# Patient Record
Sex: Male | Born: 1945 | Race: White | Hispanic: No | Marital: Married | Smoking: Never smoker
Health system: Southern US, Community
[De-identification: ages and names within clinical notes are randomized; demographics above are authoritative.]

## PROBLEM LIST (undated history)

## (undated) DIAGNOSIS — F419 Anxiety disorder, unspecified: Secondary | ICD-10-CM

## (undated) DIAGNOSIS — Z8739 Personal history of other diseases of the musculoskeletal system and connective tissue: Secondary | ICD-10-CM

## (undated) DIAGNOSIS — R29898 Other symptoms and signs involving the musculoskeletal system: Secondary | ICD-10-CM

## (undated) DIAGNOSIS — Z8679 Personal history of other diseases of the circulatory system: Secondary | ICD-10-CM

## (undated) DIAGNOSIS — Z85820 Personal history of malignant melanoma of skin: Secondary | ICD-10-CM

## (undated) DIAGNOSIS — I10 Essential (primary) hypertension: Secondary | ICD-10-CM

## (undated) DIAGNOSIS — M1990 Unspecified osteoarthritis, unspecified site: Secondary | ICD-10-CM

## (undated) DIAGNOSIS — R9430 Abnormal result of cardiovascular function study, unspecified: Secondary | ICD-10-CM

## (undated) DIAGNOSIS — Z860100 Personal history of colon polyps, unspecified: Secondary | ICD-10-CM

## (undated) DIAGNOSIS — T8859XA Other complications of anesthesia, initial encounter: Secondary | ICD-10-CM

## (undated) DIAGNOSIS — IMO0001 Reserved for inherently not codable concepts without codable children: Secondary | ICD-10-CM

## (undated) DIAGNOSIS — Z8719 Personal history of other diseases of the digestive system: Secondary | ICD-10-CM

## (undated) DIAGNOSIS — R011 Cardiac murmur, unspecified: Secondary | ICD-10-CM

## (undated) DIAGNOSIS — Z92241 Personal history of systemic steroid therapy: Secondary | ICD-10-CM

## (undated) DIAGNOSIS — M5020 Other cervical disc displacement, unspecified cervical region: Secondary | ICD-10-CM

## (undated) DIAGNOSIS — IMO0002 Reserved for concepts with insufficient information to code with codable children: Secondary | ICD-10-CM

## (undated) DIAGNOSIS — C801 Malignant (primary) neoplasm, unspecified: Secondary | ICD-10-CM

## (undated) DIAGNOSIS — K219 Gastro-esophageal reflux disease without esophagitis: Secondary | ICD-10-CM

## (undated) DIAGNOSIS — M4716 Other spondylosis with myelopathy, lumbar region: Secondary | ICD-10-CM

## (undated) DIAGNOSIS — Z820 Family history of epilepsy and other diseases of the nervous system: Secondary | ICD-10-CM

## (undated) DIAGNOSIS — M479 Spondylosis, unspecified: Secondary | ICD-10-CM

## (undated) DIAGNOSIS — R42 Dizziness and giddiness: Secondary | ICD-10-CM

## (undated) DIAGNOSIS — H9190 Unspecified hearing loss, unspecified ear: Secondary | ICD-10-CM

## (undated) DIAGNOSIS — E7800 Pure hypercholesterolemia, unspecified: Secondary | ICD-10-CM

## (undated) DIAGNOSIS — T4145XA Adverse effect of unspecified anesthetic, initial encounter: Secondary | ICD-10-CM

## (undated) HISTORY — PX: CERVICAL DISCECTOMY: SHX98

## (undated) HISTORY — DX: Reserved for concepts with insufficient information to code with codable children: IMO0002

## (undated) HISTORY — DX: Abnormal result of cardiovascular function study, unspecified: R94.30

## (undated) HISTORY — DX: Pure hypercholesterolemia, unspecified: E78.00

## (undated) HISTORY — DX: Essential (primary) hypertension: I10

## (undated) HISTORY — PX: BACK SURGERY: SHX140

## (undated) HISTORY — DX: Dizziness and giddiness: R42

## (undated) HISTORY — PX: ROTATOR CUFF REPAIR: SHX139

## (undated) HISTORY — PX: JOINT REPLACEMENT: SHX530

## (undated) HISTORY — DX: Spondylosis, unspecified: M47.9

## (undated) HISTORY — DX: Personal history of other diseases of the musculoskeletal system and connective tissue: Z87.39

## (undated) HISTORY — DX: Personal history of colonic polyps: Z86.010

## (undated) HISTORY — DX: Personal history of colon polyps, unspecified: Z86.0100

## (undated) HISTORY — DX: Personal history of malignant melanoma of skin: Z85.820

## (undated) HISTORY — DX: Gastro-esophageal reflux disease without esophagitis: K21.9

## (undated) HISTORY — DX: Other spondylosis with myelopathy, lumbar region: M47.16

## (undated) HISTORY — DX: Family history of epilepsy and other diseases of the nervous system: Z82.0

## (undated) HISTORY — DX: Unspecified osteoarthritis, unspecified site: M19.90

## (undated) HISTORY — DX: Personal history of other diseases of the circulatory system: Z86.79

---

## 1999-01-28 ENCOUNTER — Ambulatory Visit (HOSPITAL_COMMUNITY): Admission: RE | Admit: 1999-01-28 | Discharge: 1999-01-28 | Payer: Self-pay | Admitting: Gastroenterology

## 2000-02-16 ENCOUNTER — Encounter: Payer: Self-pay | Admitting: Pulmonary Disease

## 2000-02-16 ENCOUNTER — Encounter: Admission: RE | Admit: 2000-02-16 | Discharge: 2000-02-16 | Payer: Self-pay | Admitting: Pulmonary Disease

## 2000-03-31 ENCOUNTER — Ambulatory Visit (HOSPITAL_COMMUNITY): Admission: RE | Admit: 2000-03-31 | Discharge: 2000-03-31 | Payer: Self-pay | Admitting: Diagnostic Radiology

## 2000-03-31 ENCOUNTER — Encounter: Payer: Self-pay | Admitting: Orthopaedic Surgery

## 2001-05-26 ENCOUNTER — Ambulatory Visit (HOSPITAL_BASED_OUTPATIENT_CLINIC_OR_DEPARTMENT_OTHER): Admission: RE | Admit: 2001-05-26 | Discharge: 2001-05-26 | Payer: Self-pay | Admitting: General Surgery

## 2001-05-26 HISTORY — PX: MELANOMA EXCISION WITH SENTINEL LYMPH NODE BIOPSY: SHX5267

## 2001-10-26 HISTORY — PX: ANTERIOR CERVICAL DECOMP/DISCECTOMY FUSION: SHX1161

## 2001-12-15 ENCOUNTER — Ambulatory Visit (HOSPITAL_COMMUNITY): Admission: RE | Admit: 2001-12-15 | Discharge: 2001-12-15 | Payer: Self-pay | Admitting: *Deleted

## 2001-12-15 ENCOUNTER — Encounter: Payer: Self-pay | Admitting: *Deleted

## 2002-09-25 ENCOUNTER — Encounter: Payer: Self-pay | Admitting: Orthopaedic Surgery

## 2002-09-25 ENCOUNTER — Ambulatory Visit (HOSPITAL_COMMUNITY): Admission: RE | Admit: 2002-09-25 | Discharge: 2002-09-25 | Payer: Self-pay | Admitting: Orthopaedic Surgery

## 2002-09-29 ENCOUNTER — Encounter: Payer: Self-pay | Admitting: Neurosurgery

## 2002-09-29 ENCOUNTER — Encounter: Admission: RE | Admit: 2002-09-29 | Discharge: 2002-09-29 | Payer: Self-pay | Admitting: Neurosurgery

## 2002-09-30 ENCOUNTER — Encounter: Payer: Self-pay | Admitting: Neurosurgery

## 2002-09-30 ENCOUNTER — Inpatient Hospital Stay (HOSPITAL_COMMUNITY): Admission: RE | Admit: 2002-09-30 | Discharge: 2002-10-01 | Payer: Self-pay | Admitting: Neurosurgery

## 2003-12-14 ENCOUNTER — Encounter: Admission: RE | Admit: 2003-12-14 | Discharge: 2003-12-14 | Payer: Self-pay | Admitting: Orthopaedic Surgery

## 2004-03-08 ENCOUNTER — Emergency Department (HOSPITAL_COMMUNITY): Admission: EM | Admit: 2004-03-08 | Discharge: 2004-03-08 | Payer: Self-pay | Admitting: Emergency Medicine

## 2004-03-09 ENCOUNTER — Ambulatory Visit (HOSPITAL_COMMUNITY): Admission: RE | Admit: 2004-03-09 | Discharge: 2004-03-09 | Payer: Self-pay | Admitting: Emergency Medicine

## 2004-03-26 ENCOUNTER — Encounter: Admission: RE | Admit: 2004-03-26 | Discharge: 2004-03-26 | Payer: Self-pay | Admitting: Pulmonary Disease

## 2005-05-04 ENCOUNTER — Encounter: Admission: RE | Admit: 2005-05-04 | Discharge: 2005-05-04 | Payer: Self-pay | Admitting: Diagnostic Radiology

## 2005-10-11 ENCOUNTER — Encounter: Admission: RE | Admit: 2005-10-11 | Discharge: 2005-10-11 | Payer: Self-pay | Admitting: Pulmonary Disease

## 2006-07-26 ENCOUNTER — Ambulatory Visit: Payer: Self-pay | Admitting: Pulmonary Disease

## 2006-10-26 DIAGNOSIS — R011 Cardiac murmur, unspecified: Secondary | ICD-10-CM

## 2006-10-26 HISTORY — DX: Cardiac murmur, unspecified: R01.1

## 2007-01-20 ENCOUNTER — Encounter: Payer: Self-pay | Admitting: Cardiology

## 2007-01-20 ENCOUNTER — Inpatient Hospital Stay (HOSPITAL_COMMUNITY): Admission: EM | Admit: 2007-01-20 | Discharge: 2007-01-20 | Payer: Self-pay | Admitting: Emergency Medicine

## 2007-01-20 ENCOUNTER — Ambulatory Visit: Payer: Self-pay | Admitting: *Deleted

## 2007-02-17 ENCOUNTER — Ambulatory Visit: Payer: Self-pay | Admitting: Cardiology

## 2008-01-11 DIAGNOSIS — I10 Essential (primary) hypertension: Secondary | ICD-10-CM | POA: Insufficient documentation

## 2008-01-11 DIAGNOSIS — C439 Malignant melanoma of skin, unspecified: Secondary | ICD-10-CM | POA: Insufficient documentation

## 2008-03-01 ENCOUNTER — Encounter: Payer: Self-pay | Admitting: Pulmonary Disease

## 2008-06-28 ENCOUNTER — Telehealth: Payer: Self-pay | Admitting: Pulmonary Disease

## 2008-07-16 ENCOUNTER — Ambulatory Visit: Payer: Self-pay | Admitting: Pulmonary Disease

## 2008-07-16 LAB — CONVERTED CEMR LAB: Vit D, 1,25-Dihydroxy: 41 (ref 30–89)

## 2008-07-17 ENCOUNTER — Encounter: Admission: RE | Admit: 2008-07-17 | Discharge: 2008-07-17 | Payer: Self-pay | Admitting: Pulmonary Disease

## 2008-07-19 ENCOUNTER — Ambulatory Visit: Payer: Self-pay | Admitting: Pulmonary Disease

## 2008-07-19 DIAGNOSIS — K219 Gastro-esophageal reflux disease without esophagitis: Secondary | ICD-10-CM | POA: Insufficient documentation

## 2008-07-19 DIAGNOSIS — Z860100 Personal history of colon polyps, unspecified: Secondary | ICD-10-CM | POA: Insufficient documentation

## 2008-07-19 DIAGNOSIS — J309 Allergic rhinitis, unspecified: Secondary | ICD-10-CM | POA: Insufficient documentation

## 2008-07-19 DIAGNOSIS — M479 Spondylosis, unspecified: Secondary | ICD-10-CM | POA: Insufficient documentation

## 2008-07-19 DIAGNOSIS — I00 Rheumatic fever without heart involvement: Secondary | ICD-10-CM | POA: Insufficient documentation

## 2008-07-19 DIAGNOSIS — H9319 Tinnitus, unspecified ear: Secondary | ICD-10-CM | POA: Insufficient documentation

## 2008-07-19 DIAGNOSIS — E785 Hyperlipidemia, unspecified: Secondary | ICD-10-CM | POA: Insufficient documentation

## 2008-07-19 LAB — CONVERTED CEMR LAB
ALT: 39 units/L (ref 0–53)
AST: 27 units/L (ref 0–37)
Albumin: 4.3 g/dL (ref 3.5–5.2)
Alkaline Phosphatase: 73 units/L (ref 39–117)
BUN: 19 mg/dL (ref 6–23)
Basophils Absolute: 0.1 10*3/uL (ref 0.0–0.1)
Basophils Relative: 0.9 % (ref 0.0–3.0)
Bilirubin Urine: NEGATIVE
Bilirubin, Direct: 0.2 mg/dL (ref 0.0–0.3)
CO2: 31 meq/L (ref 19–32)
Calcium: 9.5 mg/dL (ref 8.4–10.5)
Chloride: 107 meq/L (ref 96–112)
Cholesterol: 171 mg/dL (ref 0–200)
Creatinine, Ser: 1 mg/dL (ref 0.4–1.5)
Eosinophils Absolute: 0.2 10*3/uL (ref 0.0–0.7)
Eosinophils Relative: 2.8 % (ref 0.0–5.0)
GFR calc Af Amer: 98 mL/min
GFR calc non Af Amer: 81 mL/min
Glucose, Bld: 109 mg/dL — ABNORMAL HIGH (ref 70–99)
HCT: 46.8 % (ref 39.0–52.0)
HDL: 55.8 mg/dL (ref >39.0)
Hemoglobin, Urine: NEGATIVE
Hemoglobin: 16.5 g/dL (ref 13.0–17.0)
Ketones, ur: NEGATIVE mg/dL
LDL Cholesterol: 104 mg/dL — ABNORMAL HIGH (ref 0–99)
Leukocytes, UA: NEGATIVE
Lymphocytes Relative: 20.4 % (ref 12.0–46.0)
MCHC: 35.2 g/dL (ref 30.0–36.0)
MCV: 91.7 fL (ref 78.0–100.0)
Monocytes Absolute: 0.8 10*3/uL (ref 0.1–1.0)
Monocytes Relative: 10.9 % (ref 3.0–12.0)
Neutro Abs: 4.6 10*3/uL (ref 1.4–7.7)
Neutrophils Relative %: 65 % (ref 43.0–77.0)
Nitrite: NEGATIVE
PSA: 1.24 ng/mL (ref 0.10–4.00)
Platelets: 290 10*3/uL (ref 150–400)
Potassium: 4.3 meq/L (ref 3.5–5.1)
RBC: 5.1 M/uL (ref 4.22–5.81)
RDW: 11.4 % — ABNORMAL LOW (ref 11.5–14.6)
Sodium: 141 meq/L (ref 135–145)
Specific Gravity, Urine: 1.01 (ref 1.000–1.03)
TSH: 1.81 microintl units/mL (ref 0.35–5.50)
Total Bilirubin: 1.4 mg/dL — ABNORMAL HIGH (ref 0.3–1.2)
Total CHOL/HDL Ratio: 3.1
Total Protein, Urine: NEGATIVE mg/dL
Total Protein: 7.5 g/dL (ref 6.0–8.3)
Triglycerides: 54 mg/dL (ref 0–149)
Urine Glucose: NEGATIVE mg/dL
Urobilinogen, UA: 0.2 (ref 0.0–1.0)
VLDL: 11 mg/dL (ref 0–40)
WBC: 7.1 10*3/uL (ref 4.5–10.5)
pH: 7.5 (ref 5.0–8.0)

## 2008-10-26 DIAGNOSIS — T8859XA Other complications of anesthesia, initial encounter: Secondary | ICD-10-CM

## 2008-10-26 HISTORY — DX: Other complications of anesthesia, initial encounter: T88.59XA

## 2008-12-06 ENCOUNTER — Encounter: Payer: Self-pay | Admitting: Pulmonary Disease

## 2008-12-18 ENCOUNTER — Encounter: Payer: Self-pay | Admitting: Adult Health

## 2008-12-28 ENCOUNTER — Telehealth (INDEPENDENT_AMBULATORY_CARE_PROVIDER_SITE_OTHER): Payer: Self-pay | Admitting: *Deleted

## 2009-01-17 ENCOUNTER — Encounter: Payer: Self-pay | Admitting: Pulmonary Disease

## 2009-01-22 ENCOUNTER — Encounter: Payer: Self-pay | Admitting: Pulmonary Disease

## 2009-01-29 ENCOUNTER — Telehealth: Payer: Self-pay | Admitting: Pulmonary Disease

## 2009-03-28 ENCOUNTER — Encounter: Payer: Self-pay | Admitting: Pulmonary Disease

## 2009-04-09 ENCOUNTER — Encounter: Payer: Self-pay | Admitting: Pulmonary Disease

## 2009-05-02 ENCOUNTER — Encounter: Payer: Self-pay | Admitting: Pulmonary Disease

## 2009-05-21 ENCOUNTER — Encounter: Payer: Self-pay | Admitting: Pulmonary Disease

## 2010-03-03 ENCOUNTER — Encounter: Payer: Self-pay | Admitting: Pulmonary Disease

## 2010-03-05 ENCOUNTER — Ambulatory Visit: Payer: Self-pay | Admitting: Pulmonary Disease

## 2010-05-19 ENCOUNTER — Telehealth: Payer: Self-pay | Admitting: Pulmonary Disease

## 2010-06-20 ENCOUNTER — Ambulatory Visit: Payer: Self-pay | Admitting: Pulmonary Disease

## 2010-06-20 ENCOUNTER — Encounter: Admission: RE | Admit: 2010-06-20 | Discharge: 2010-06-20 | Payer: Self-pay | Admitting: Pulmonary Disease

## 2010-06-20 DIAGNOSIS — M1189 Other specified crystal arthropathies, multiple sites: Secondary | ICD-10-CM | POA: Insufficient documentation

## 2010-06-20 DIAGNOSIS — M25559 Pain in unspecified hip: Secondary | ICD-10-CM | POA: Insufficient documentation

## 2010-06-21 DIAGNOSIS — M1990 Unspecified osteoarthritis, unspecified site: Secondary | ICD-10-CM | POA: Insufficient documentation

## 2010-07-04 ENCOUNTER — Encounter: Admission: RE | Admit: 2010-07-04 | Discharge: 2010-07-04 | Payer: Self-pay | Admitting: Pulmonary Disease

## 2010-07-04 ENCOUNTER — Encounter: Payer: Self-pay | Admitting: Pulmonary Disease

## 2010-07-11 ENCOUNTER — Encounter: Payer: Self-pay | Admitting: Pulmonary Disease

## 2010-07-21 ENCOUNTER — Encounter: Payer: Self-pay | Admitting: Pulmonary Disease

## 2010-07-22 ENCOUNTER — Encounter: Admission: RE | Admit: 2010-07-22 | Discharge: 2010-07-22 | Payer: Self-pay | Admitting: Pulmonary Disease

## 2010-09-24 ENCOUNTER — Telehealth: Payer: Self-pay | Admitting: Pulmonary Disease

## 2010-09-29 ENCOUNTER — Encounter: Payer: Self-pay | Admitting: Pulmonary Disease

## 2010-10-12 ENCOUNTER — Encounter: Admission: RE | Admit: 2010-10-12 | Payer: Self-pay | Source: Home / Self Care | Admitting: Orthopaedic Surgery

## 2010-10-12 ENCOUNTER — Encounter
Admission: RE | Admit: 2010-10-12 | Discharge: 2010-10-12 | Payer: Self-pay | Source: Home / Self Care | Attending: Orthopaedic Surgery | Admitting: Orthopaedic Surgery

## 2010-10-13 ENCOUNTER — Encounter
Admission: RE | Admit: 2010-10-13 | Discharge: 2010-10-13 | Payer: Self-pay | Source: Home / Self Care | Attending: Orthopaedic Surgery | Admitting: Orthopaedic Surgery

## 2010-10-31 ENCOUNTER — Encounter: Payer: Self-pay | Admitting: Pulmonary Disease

## 2010-11-17 ENCOUNTER — Encounter
Admission: RE | Admit: 2010-11-17 | Discharge: 2010-11-17 | Payer: Self-pay | Source: Home / Self Care | Attending: Neurosurgery | Admitting: Neurosurgery

## 2010-11-25 NOTE — Progress Notes (Signed)
Summary: REFILL  Phone Note Call from Patient Call back at [phone]   Caller: Patient Call For: Peter [name] Summary of Call: PT NEEDS A REFILL ON ACILOVER 200 MG CALLED TO [facility] [phone] Initial call taken by: Lacinda Axon,  May 19, 2010 8:43 AM  Follow-up for Phone Call        lmomtcb Randell Loop [facility]  May 19, 2010 9:03 AM   pt returned my call---acyclovir has been sent to his pharmacy Randell Loop CMA  May 19, 2010 9:14 AM     New/Updated Medications: ACYCLOVIR 200 MG CAPS (ACYCLOVIR) take one capsule by mouth four times daily Prescriptions: ACYCLOVIR 200 MG CAPS (ACYCLOVIR) take one capsule by mouth four times daily  #50 x 0   Entered by:   Randell Loop CMA   Authorized by:   Michele Mcalpine MD   Signed by:   Randell Loop CMA on 05/19/2010   Method used:   [facility]ronically to        [facility] [address] [phone][facility] (retail)       [address]       [place], [place]  [address]       Ph: [phone] or [phone]       Fax: [phone]   RxID:   [id number]

## 2010-11-25 NOTE — Procedures (Signed)
Summary: [facility]  [facility]   Imported By: Sherian Rein 03/10/2010 11:54:21  _____________________________________________________________________  External Attachment:    Type:   Image     Comment:   External Document

## 2010-11-25 NOTE — Progress Notes (Signed)
Summary: appt/ labs?  Phone Note Call from Patient   Caller: Patient Call For: Peter [name] Summary of Call: pt- dr Peter [name]- wants to know what labs he will need to have done at the time of his visit. i have put this is as an rov- ok per Peter [name]- however- if this needs to be a cpx it will need to be changed. pt didn't mention this would be a cpx. pt can be reached at (his personal #)  [phone] today or tomorrow at [facility] [phone]. Initial call taken by: Peter [name],  June 28, 2008 10:26 AM  Follow-up for Phone Call        called and spoke with Dr. Gery [name] and he will come in on 9/21 to get his labs done for his appt on 9/24.  labs have been ordered by SN. Follow-up by: Peter [name] CMA,  June 29, 2008 8:53 AM

## 2010-11-25 NOTE — Progress Notes (Signed)
Summary: Protonix refill  Phone Note From Pharmacy   Caller: Nicholos Johns at [facility] For: Kriste Basque  Summary of Call: Called to get verbal refill for Protonix 90 day supply.  Initial call taken by: Reynaldo Minium CMA,  September 24, 2010 4:02 PM  Follow-up for Phone Call        Verbal was given for 90 days plus 1 refill.Reynaldo Minium CMA  September 24, 2010 4:02 PM     Prescriptions: PROTONIX 40 MG  PACK (PANTOPRAZOLE SODIUM) Take 1 tablet by mouth once a day  #90 x 1   Entered by:   Reynaldo Minium CMA   Authorized by:   Michele Mcalpine MD   Signed by:   Reynaldo Minium CMA on 09/24/2010   Method used:   Historical   RxID:   [id number]

## 2010-11-25 NOTE — Medication Information (Signed)
Summary: HCTZ 25mg / [facility]  HCTZ 25mg / [facility]   Imported By: Lester Fauquier 04/04/2009 08:05:33  _____________________________________________________________________  External Attachment:    Type:   Image     Comment:   External Document

## 2010-11-25 NOTE — Medication Information (Signed)
Summary: Rx for Accup[facility]  Rx for Accup[facility]   Imported By: Esmeralda Links D'jimraou 12/12/2008 11:57:57  _____________________________________________________________________  External Attachment:    Type:   Image     Comment:   External Document

## 2010-11-25 NOTE — Progress Notes (Signed)
Summary: labs  Phone Note Call from Patient Call back at [phone]   Caller: Patient Call For: Peter [name] Summary of Call: pt faxed over labs over a week ago still hasnt heard on them he will be at this number till 5:30 and will be back there tomorrow at 8:30 to 5;30 Initial call taken by: Lacinda Axon,  January 29, 2009 5:08 PM  Follow-up for Phone Call        I called Dr Gery Pray and we decided on Lipitor40... SN Follow-up by: Michele Mcalpine MD,  January 29, 2009 5:35 PM    New/Updated Medications: LIPITOR 40 MG TABS (ATORVASTATIN CALCIUM) take 1 tab by mouth once daily... LOVAZA 1 GM CAPS (OMEGA-3-ACID ETHYL ESTERS) take 2 caps by mouth two times a day... AMBIEN 10 MG TABS (ZOLPIDEM TARTRATE) take 1 tab by mouth at bedtime as needed for sleep...   Prescriptions: AMBIEN 10 MG TABS (ZOLPIDEM TARTRATE) take 1 tab by mouth at bedtime as needed for sleep...  #30 x 5   Entered by:   Michele Mcalpine MD   Authorized by:   Marijo File CMA   Signed by:   Michele Mcalpine MD on 01/29/2009   Method used:   Print then Give to Patient   RxID:   [id number] LIPITOR 40 MG TABS (ATORVASTATIN CALCIUM) take 1 tab by mouth once daily...  #90 x 4   Entered by:   Michele Mcalpine MD   Authorized by:   Marijo File CMA   Signed by:   Michele Mcalpine MD on 01/29/2009   Method used:   Print then Give to Patient   RxID:   [id number]

## 2010-11-25 NOTE — Progress Notes (Signed)
Summary: PT CALLED BACK  Phone Note Call from Patient Call back at [facility] [phone]   Caller: Patient Call For: NADEL Summary of Call: PT WENT TO SEE DR CROUSE  AND HE WANTS TO INCREASE HIS HCTZ HE WANTS TO KNOW IF THIS IS OK AND IF SO DOES HE NEED TO INCREASE HIS POTASSIUM Initial call taken by: Lacinda Axon,  December 28, 2008 12:03 PM  Follow-up for Phone Call        lmtcb  Philipp Deputy [facility]  December 28, 2008 12:08 PM   dr Kriste Basque spoke to pt and took care of this msg Follow-up by: Philipp Deputy CMA,  December 28, 2008 3:17 PM

## 2010-11-25 NOTE — Assessment & Plan Note (Signed)
Summary: hip pain/right hand pain/la   CC:  Add-on for R>L hip area pain....  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  see prob list below:   ~  June 20, 2010:  Add-on today at pt request for intermittent R>L hip area pain, worse recently & Rx w/ OTC Ibuprofen w/ some help... no prev ortho problems, but mild OA apparent on exam fingers etc... exam shows good ROM w/o pain, non-tender, no skin lesions, etc... we discussed Rx w/ MOBIC, check XRays, & next step= Ortho eval for poss shot in the area.   Current Problem List:  ALLERGIC RHINITIS (ICD-477.9) - eval by DrKozlow et al in 2006... seasonal symptoms treated w/ OTC meds as needed... currently on SINGULAIR 10mg /d, and NASONEX 2sp Qhs...  TINNITUS, CHRONIC (ICD-388.30) - eval by DrKraus in the past... chronic symptom- deals with it effectively using white noise etc...  HYPERTENSION (ICD-401.9) - controlled on meds- ASA 81mg /d,  NORVASC 10mg /d,  ACCUPRIL 10mg /d,  HCTZ 25mg /d,  KCl - 1/2 Qd...  BP= 132/70 today, takes meds regularly & tol well w/o side effects... BP checks at home and work are similar... denies HA, fatigue, visual changes, CP, palipit, dizziness, syncope, dyspnea, edema, etc...   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 eval by cardiology in the 80's & no valvular heart disease... he had a Cardiolite in 2005 that was neg- no ischemia & EF=60%... he was hosp w/ CP episode 3/08 by DrBBrodie- felt to be prob pericarditis... he had a Coronary CTA showing sl plaque at an LAD branch vessel (D2), mixed plaque in CIRC, no plaque in prox-mid RCA; Calcium score 0.65 (<25th percentlie)... 2DEcho 3/08 showed no signif valvular lesions, LVwall thickness at upper limit, no regional wall motion abn, LVF= 50-55%...  HYPERCHOLESTEROLEMIA, BORDERLINE (ICD-272.4) - he started taking Lipitor prev due to reports of poss benefits in Alzheimer's prevention...  currently on LIPITOR 40mg /d, and LOVAZA 4gm/d...  ~  FLP 4/08 on Lipitor40 showed TChol 138, TG 45, HDL 49, LDL 80  ~  FLP 9/09 on ?diet alone showed TChol 171, TG 54, HDL 56, LDL 104  ~  FLP 3/11 on Lip40+Lovaza showed TChol 124, TG 51, HDL 63, LDL 51  GERD (ICD-530.81) - he has been evaluated by [facility]... takes PROTONIX 40mg /d...  COLONIC POLYPS, HX OF (ICD-V12.72) - colonoscopy 4/04 by ZOXWRUEA w/ diminutive hyperplastic polyp removed & sm int hem...  f/u colonoscopy done 5/11 was neg- no polyps...  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  ~  8/11: presented w/ c/o R>L hip pain, XRays showed degen arthritis & mild chondrocalcinosis.  Hx of SPONDYLOSIS (ICD-721.90) - He is s/p C6-7 anterior cervical diskectomy w/ bone allograft & plating 12/03 by Lenon Oms... 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 also lumbar spondy w/ spurs at L4-5 seen on prev CTAbd done 12/06...  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 followed closely by DrPatMauro at [facility] & no known recurrence (switching to [facility]- DrJones)...  ALZHEIMER'S DISEASE, FAMILY HX (ICD-V17.0) - family hx of Alzheimer's disease in both his mother and father (both died in their  80's w/ this disease)...   Preventive Screening-Counseling & Management  Alcohol-Tobacco     Smoking Status: never  Allergies: 1)  ! Avelox (Moxifloxacin Hcl)  Comments:  Nurse/Medical Assistant: The patient's medications and allergies were reviewed with the patient and were updated in the Medication and Allergy Lists.  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 Father died age 48 from prob subdural hematoma, alzheimers Mother deceased age 2  w/ hx of alzheimers 3 Siblings: 1 Brother, 2 Sisters- ages 23-71 in good general health  Social History: Reviewed history from 07/19/2008 and no changes required. Married - wife= [name] 2 children- Tax adviser, Son= physician non smoker exercises 3 times per week caffeine use:  2 cups/d etoh use:  1-2 glasses of wine nightly  Review of Systems      See HPI  The patient denies anorexia, fever, weight loss, weight gain, vision loss, decreased hearing, hoarseness, chest pain, syncope, dyspnea on exertion, peripheral edema, prolonged cough, headaches, hemoptysis, abdominal pain, melena, hematochezia, severe indigestion/heartburn, hematuria, incontinence, muscle weakness, suspicious skin lesions, transient blindness, difficulty walking, depression, unusual weight change, abnormal bleeding, enlarged lymph nodes, and angioedema.    Vital Signs:  Patient profile:   65 year old male Height:      70 inches Weight:      186.13 pounds O2 Sat:      97 % on Room air Temp:     96.1 degrees F oral Pulse rate:   75 / minute BP sitting:   132 / 70  (left arm) Cuff size:   regular  Vitals Entered By: Randell Loop CMA (June 20, 2010 9:24 AM)  O2 Sat at Rest %:  97 O2 Flow:  Room air CC: Add-on  for R>L hip area pain... Is Patient Diabetic? No Pain Assessment Patient in pain? yes      Comments meds updated today with pt   Physical Exam  Additional Exam:  WD, WN, 65 y/o WM in NAD... GENERAL:  Alert & oriented; pleasant & cooperative... HEENT:  Mallory/AT, EOM-wnl, PERRLA, EACs-clear, TMs-wnl, NOSE-clear, THROAT-clear & wnl. NECK:  Supple w/ fairROM; no JVD; normal carotid impulses w/o bruits; no thyromegaly or nodules palpated; no lymphadenopathy. CHEST:  Clear to P & A; without wheezes/ rales/ or rhonchi. HEART:  Regular Rhythm; without murmurs/ rubs/ or gallops. ABDOMEN:  Soft & nontender; right peri-umbil scar of melanoma surg; normal bowel sounds; no organomegaly or masses palpated. RECTAL:  Neg - prostate 2+ & nontender w/o nodules; stool hematest neg; groin lymphoceles. EXT:  mild OA fingers- norm ROM hips/ knees/ etc; no varicose veins/ +venous insuffic/ no edema. NEURO:  CN's intact; motor testing normal; sensory testing normal; gait normal & balance OK. DERM:  No lesions noted; no rash etc...    X-ray Musculoskeletal  Procedure date:  06/20/2010  Findings:      PELVIS - 1-2 VIEW Comparison: None   Findings: Surgical clips are present overlying the femoral head bilaterally.   Mild cartilage loss and spurring  is present involving the right femoral head and right acetabulum.  There is mild calcification lateral to the superior acetabulum which may be chondrocalcinosis.   There is moderate degenerative change in the left hip with spurring of the femoral head as well as joint space narrowing and mild acetabular spurring.  There is faint calcification lateral to the superior acetabulum which may be due to chondrocalcinosis of  the labrum.   Negative for fracture.  The SI joints are intact.  No acute bony abnormality.   IMPRESSION: Degenerative osteoarthritis of both hips, left greater than right.   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  This may  be due to CPPD or osteoarthritis.   Read By:  Camelia Phenes,  [name]   Impression & Recommendations:  Problem # 1:  DEGENERATIVE JOINT DISEASE (ICD-715.90) XRays show DJD, chondrocalcinosis>  we discussed Rx w/ MOBIC 7.5 to 15mg /d & further eval by Ortho id symptoms progress. His updated medication list for this problem includes:    Low-dose Aspirin 81 Mg Tabs (Aspirin) .Marland Kitchen... Take 1 tablet by mouth once a day    Meloxicam 7.5 Mg Tabs (Meloxicam) .Marland Kitchen... Take 1-2 tabs by mouth once daily as directed...  Problem # 2:  HYPERTENSION (ICD-401.9) BP controlled>  same meds. His updated medication list for this problem includes:    Norvasc 10 Mg Tabs (Amlodipine besylate) .Marland Kitchen... Take 1 tablet by mouth once a day    Accupril 10 Mg Tabs (Quinapril hcl) .Marland Kitchen... Take 1 tablet by mouth once a day    Hydrochlorothiazide 25 Mg Tabs (Hydrochlorothiazide) .Marland Kitchen... Take 1 tablet by mouth once a day  Orders: No Charge Patient Arrived (NCPA0) (NCPA0)  Problem # 3:  HYPERCHOLESTEROLEMIA, BORDERLINE (ICD-272.4) Stable on meds- continue same. His updated medication list for this problem includes:    Lipitor 40 Mg Tabs (Atorvastatin calcium) .Marland Kitchen... Take 1 tab by mouth once daily...    Lovaza 1 Gm Caps (Omega-3-acid ethyl esters) .Marland Kitchen... Take 2 caps by mouth two times a day...  Problem # 4:  OTHER MEDICAL PROBLEMS AS NOTED>>>  Complete Medication List: 1)  Nasonex 50 Mcg/act Susp (Mometasone furoate) .... Two puffs each nostril daily 2)  Singulair 10 Mg Tabs (Montelukast sodium) .Marland Kitchen.. 1 by mouth once daily 3)  Low-dose Aspirin 81 Mg Tabs (Aspirin) .... Take 1 tablet by mouth once a day 4)  Norvasc 10 Mg Tabs (Amlodipine besylate) .... Take 1 tablet by mouth once a day 5)  Accupril 10 Mg Tabs (Quinapril hcl) .... Take 1 tablet by mouth once a day 6)  Hydrochlorothiazide 25 Mg Tabs (Hydrochlorothiazide) .... Take 1 tablet by mouth once a day 7)  Klor-con M20 20 Meq Tbcr (Potassium chloride crys cr) .... Take 1/2   tablet by mouth once a day 8)  Lipitor 40 Mg Tabs (Atorvastatin calcium) .... Take 1 tab by mouth once daily.Marland KitchenMarland Kitchen 9)  Lovaza 1 Gm Caps (Omega-3-acid ethyl esters) .... Take 2 caps by mouth two times a day... 10)  Protonix 40 Mg Pack (Pantoprazole sodium) .... Take 1 tablet by mouth once a day 11)  Ambien 10 Mg Tabs (Zolpidem tartrate) .... Take 1 tab by mouth at bedtime as needed for sleep... 12)  Acyclovir 200 Mg Caps (Acyclovir) .... Take one capsule by mouth four times daily 13)  Meloxicam 7.5 Mg Tabs (Meloxicam) .... Take 1-2 tabs by mouth once daily as directed...  Patient Instructions: 1)  Today we updated your med list- see below.... 2)  we decided to add  generic MOBIC 7.5 to 15mg  daily as needed for the hip pain.Marland KitchenMarland Kitchen  3)  Please check right hip XRay at your convenience for baseline... 4)  Next step = Ortho eval as we discussed.Marland KitchenMarland Kitchen 5)  Call for any problems... Prescriptions: MELOXICAM 7.5 MG TABS (MELOXICAM) take 1-2 tabs by mouth once daily as directed...  #60 x 11   Entered and Authorized by:   Michele Mcalpine MD   Signed by:   Michele Mcalpine MD on 06/20/2010   Method used:   Print then Give to Patient   RxID:   [id number]    Immunization History:  Influenza Immunization History:    Influenza:  historical (08/14/2009)

## 2010-11-25 NOTE — Medication Information (Signed)
Summary: Tax adviser   Imported By: Lehman Prom 12/18/2008 08:05:45  _____________________________________________________________________  External Attachment:    Type:   Image     Comment:   External Document

## 2010-11-25 NOTE — Medication Information (Signed)
Summary: Rx for Singulair Tab/[facility]  Rx for Singulair Tab/[facility]   Imported By: Maryln Gottron 06/04/2009 15:48:32  _____________________________________________________________________  External Attachment:    Type:   Image     Comment:   External Document

## 2010-11-25 NOTE — Medication Information (Signed)
Summary: Tax adviser   Imported By: Valinda Hoar 04/09/2009 14:30:03  _____________________________________________________________________  External Attachment:    Type:   Image     Comment:   External Document

## 2010-11-25 NOTE — Miscellaneous (Signed)
  Clinical Lists Changes  Allergies: Added new allergy or adverse reaction of AVELOX (MOXIFLOXACIN HCL) Observations: Added new observation of ALLERGY REV: Done (05/02/2009 16:53) Added new observation of NKA: F (05/02/2009 16:53)

## 2010-11-25 NOTE — Medication Information (Signed)
Summary: LOVAZA;NASONEX;[facility]  LOVAZA;NASONEX;[facility]   Imported By: Lester [place] 01/28/2009 09:39:49  _____________________________________________________________________  External Attachment:    Type:   Image     Comment:   External Document

## 2010-11-25 NOTE — Assessment & Plan Note (Signed)
Summary: cpx/ok per lw   Chief Complaint:  CPX....  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 here for a physical exam- doing well without new complaints or concerns...   Current Problem List:  PHYSICAL EXAMINATION (ICD-V70.0)  ALLERGIC RHINITIS (ICD-477.9) - eval by DrKozlow et al in 2006... seasonal symptoms treated w/ OTC meds as needed.  TINNITUS, CHRONIC (ICD-388.30) - eval by DrKraus in the past... chronic symptom- deals with it effectively using white noise etc...  HYPERTENSION (ICD-401.9) - controlled on meds- NORVASC 10mg /d,  ACCUPRIL 10mg /d,  HCTZ 25mg /d,  KCl 69mEq/d...  BP= 120/82 today, takes meds regularly & tol well w/o side effects... BP checks at home and work are similar... denies HA, fatigue, visual changes, CP, palipit, dizziness, syncope, dyspnea, edema, etc...   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 eval by cardiology in the 80's & no valvular heart disease... he had a Cardiolite in 2005 that was neg- no ischemia & EF=60%... he was hosp w/ CP episode 3/08 by DrBBrodie- felt to be prob pericarditis... he had a Coronary CTA showing sl plaque at an LAD branch vessel (D2), mixed plaque in CIRC, no plaque in prox-mid RCA; Calcium score 0.65 (<25th percentlie)... 2DEcho 3/08 showed no signif valvular lesions, LVwall thickness at upper limit, no regional wall motion abn, LVF= 50-55%...  HYPERCHOLESTEROLEMIA, BORDERLINE (ICD-272.4) - he started taking Lipitor prev due to reports of poss benefits in Alzheimer's prevention... he has recently stopped the statin...  ~  FLP 4/08 on Lipitor40 showed TChol 138, TG 45, HDL 49, LDL 80...  ~  FLP 9/09 on diet alone showed TChol 171, TG 54, HDL 56, LDL 104...  GERD (ICD-530.81) - he has been [facility]... takes PROTONIX 40mg /d...  COLONIC POLYPS, HX OF (ICD-V12.72) - last colonoscopy 4/04 by [facility] w/ diminutive hyperplastic polyp removed &  sm int hem... f/u planned 17yrs...  Hx of SPONDYLOSIS (ICD-721.90) - He is s/p C6-7 anterior cervical diskectomy w/ bone allograft & plating 12/03 by drNudelman... 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 also lumbar spondy w/ spurs at L4-5 seen on prev CTAbd done 12/06...  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 followed closely by DrPatMauro at [facility] & no known recurrence...  ALZHEIMER'S DISEASE, FAMILY HX (ICD-V17.0) - family hx of Alzheimer's disease in both his mother and father (both died in their 28's w/ this disease)...      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)    HYPERCHOLESTEROLEMIA, BORDERLINE (ICD-272.4)    GERD (ICD-530.81)    COLONIC POLYPS, HX OF (ICD-V12.72)    Hx of SPONDYLOSIS (ICD-721.90)    ALZHEIMER'S DISEASE, FAMILY HX (ICD-V17.0)    Hx of MELANOMA (ICD-172.9)      Past Surgical History:    S/P wide excision of melanoma on abd wall w/ sentinel node biopsy 8/02 by DrPYoung    S/P C6-7 anterior cervical diskectomy w/ patellar allograft and plating 12/03 by Lenon Oms   Family History:    Father died age 40 from  prob subdural hematoma, alzheimers    Mother deceased age 10  w/ hx of alzheimers    3 Siblings: 1 Brother, 2 Sisters- ages 34-71 in good general health  Social History:    Married - wife= [name]    2 children- Tax adviser, Son= physician    non smoker    exercises 3 times per week    caffeine use:  2 cups/d    etoh use:  1-2 glasses of wine nightly   Risk Factors:  Tobacco use:  never   Review of Systems       The patient complains of tinnitus  and decreased hearing.  The patient denies fever, chills, sweats, anorexia, fatigue, weakness, malaise, weight loss, sleep disorder, blurring, diplopia, eye irritation, eye discharge, vision loss, eye pain, photophobia, earache, ear discharge, nasal congestion, nosebleeds, sore throat, hoarseness, chest pain, palpitations, syncope, dyspnea on exertion, orthopnea, PND, peripheral edema, cough, dyspnea at rest, excessive sputum, hemoptysis, wheezing, pleurisy, nausea, vomiting, diarrhea, constipation, change in bowel habits, abdominal pain, melena, hematochezia, jaundice, gas/bloating, indigestion/heartburn, dysphagia, odynophagia, dysuria, hematuria, urinary frequency, urinary hesitancy, nocturia, incontinence, back pain, joint pain, joint swelling, muscle cramps, muscle weakness, stiffness, arthritis, sciatica, restless legs, leg pain at night, leg pain with exertion, rash, itching, dryness, suspicious lesions, paralysis, paresthesias, seizures, tremors, vertigo, transient blindness, frequent falls, frequent headaches, difficulty walking, depression, anxiety, memory loss, confusion, cold intolerance, heat intolerance, polydipsia, polyphagia, polyuria, unusual weight change, abnormal bruising, bleeding, enlarged lymph nodes, urticaria, allergic rash, hay fever, and recurrent infections.     Vital Signs:  Patient Profile:   65 Years Old Male Weight:      184 pounds O2 Sat:      95 % O2 treatment:    Room Air Temp:     97.2 degrees F oral Pulse rate:   78 / minute BP sitting:   120 / 82  (right arm)  Vitals Entered By: Marijo File CMA (July 19, 2008 12:10 PM)                 Physical Exam  WD, WN, 65 y/o WM in NAD... GENERAL:  Alert & oriented; pleasant & cooperative... HEENT:  [place]/AT, EOM-wnl, PERRLA, EACs-clear, TMs-wnl, NOSE-clear, THROAT-clear & wnl. NECK:  Supple w/ fairROM; no JVD; normal carotid impulses w/o bruits; no thyromegaly or nodules palpated; no lymphadenopathy. CHEST:   Clear to P & A; without wheezes/ rales/ or rhonchi. HEART:  Regular Rhythm; without murmurs/ rubs/ or gallops. ABDOMEN:  Soft & nontender; right peri-umbil scar of melanoma surg; normal bowel sounds; no organomegaly or masses palpated. RECTAL:  Neg - prostate 2+ & nontender w/o nodules; stool hematest neg. EXT: without deformities or arthritic changes; no varicose veins/ +venous insuffic/ no edema. NEURO:  CN's intact; motor testing normal; sensory testing normal; gait normal & balance OK. DERM:  No lesions noted; no rash etc...        Impression & Recommendations:  Problem # 1:  PHYSICAL EXAMINATION (ICD-V70.0) Good general health... Orders: EKG w/ Interpretation (93000) No Charge Patient Arrived (NCPA0) (NCPA0)   Problem # 2:  ALLERGIC RHINITIS (ICD-477.9) Stable- OTC meds + Nasonex as needed. His updated medication list for this problem includes:    Nasonex 50 Mcg/act Susp (Mometasone furoate) .Marland Kitchen..Marland Kitchen Two puffs each nostril daily   Problem # 3:  HYPERTENSION (ICD-401.9) Controlled on meds- continue same. His updated medication list for this problem includes:    Norvasc 10 Mg Tabs (Amlodipine besylate) .Marland Kitchen... Take 1 tablet by mouth once  a day    Accupril 10 Mg Tabs (Quinapril hcl) .Marland Kitchen... Take 1 tablet by mouth once a day    Hydrochlorothiazide 25 Mg Tabs (Hydrochlorothiazide) .Marland Kitchen... Take 1 tablet by mouth once a day   Problem # 4:  HYPERCHOLESTEROLEMIA, BORDERLINE (ICD-272.4) On diet alone now- labs look good...  Problem # 5:  COLONIC POLYPS, HX OF (ICD-V12.72) F/u colon due 2010...  Problem # 6:  Hx of SPONDYLOSIS (ICD-721.90) He is very active, Sporttime 3x per week... no signif back pain problems...  Problem # 7:  Hx of MELANOMA (ICD-172.9) No known recurrence, doing well...  Complete Medication List: 1)  Nasonex 50 Mcg/act Susp (Mometasone furoate) .... Two puffs each nostril daily 2)  Singulair 10 Mg Tabs (Montelukast sodium) .Marland Kitchen.. 1 by mouth once daily 3)   Low-dose Aspirin 81 Mg Tabs (Aspirin) .... Take 1 tablet by mouth once a day 4)  Norvasc 10 Mg Tabs (Amlodipine besylate) .... Take 1 tablet by mouth once a day 5)  Accupril 10 Mg Tabs (Quinapril hcl) .... Take 1 tablet by mouth once a day 6)  Hydrochlorothiazide 25 Mg Tabs (Hydrochlorothiazide) .... Take 1 tablet by mouth once a day 7)  Klor-con M20 20 Meq Tbcr (Potassium chloride crys cr) .... Take 1 tablet by mouth once a day 8)  Protonix 40 Mg Pack (Pantoprazole sodium) .... Take 1 tablet by mouth once a day    ]  Appended Document: refills of meds    Clinical Lists Changes  Medications: Changed medication from NORVASC 10 MG  TABS (AMLODIPINE BESYLATE) Take 1 tablet by mouth once a day to NORVASC 10 MG  TABS (AMLODIPINE BESYLATE) Take 1 tablet by mouth once a day - Signed Changed medication from ACCUPRIL 10 MG  TABS (QUINAPRIL HCL) Take 1 tablet by mouth once a day to ACCUPRIL 10 MG  TABS (QUINAPRIL HCL) Take 1 tablet by mouth once a day - Signed Changed medication from PROTONIX 40 MG  PACK (PANTOPRAZOLE SODIUM) Take 1 tablet by mouth once a day to PROTONIX 40 MG  PACK (PANTOPRAZOLE SODIUM) Take 1 tablet by mouth once a day - Signed Rx of NORVASC 10 MG  TABS (AMLODIPINE BESYLATE) Take 1 tablet by mouth once a day;  #90 x 2;  Signed;  Entered by: Marijo File CMA;  Authorized by: Michele Mcalpine MD;  Method used: Telephoned to [facility] [address] [phone]*, [address] [place], [place], [place]  [address], Ph: [phone] or [phone], Fax: [phone] Rx of ACCUPRIL 10 MG  TABS (QUINAPRIL HCL) Take 1 tablet by mouth once a day;  #90 x 2;  Signed;  Entered by: Marijo File CMA;  Authorized by: Michele Mcalpine MD;  Method used: Telephoned to [facility] [address] [phone]*, [address] [place], [place], [place]  [address], Ph: [phone] or [phone], Fax: [phone] Rx of PROTONIX 40 MG  PACK (PANTOPRAZOLE SODIUM) Take 1 tablet by mouth once a day;  #90 x  2;  Signed;  Entered by: Marijo File CMA;  Authorized by: Michele Mcalpine MD;  Method used: Telephoned to [facility] [address] [phone]*, [address] [place], [place], [place]  [address], Ph: [phone] or [phone], Fax: [phone]    Prescriptions: PROTONIX 40 MG  PACK (PANTOPRAZOLE SODIUM) Take 1 tablet by mouth once a day  #90 x 2   Entered by:   Marijo File CMA   Authorized by:   Michele Mcalpine MD   Signed by:  Marijo File CMA on 12/06/2008   Method used:   Telephoned to ...       [facility] [address] [phone]* (retail)       [address]       [place], [place]  [address]       Ph: [phone] or [phone]       Fax: [phone]   RxID:   [id number] ACCUPRIL 10 MG  TABS (QUINAPRIL HCL) Take 1 tablet by mouth once a day  #90 x 2   Entered by:   Marijo File CMA   Authorized by:   Michele Mcalpine MD   Signed by:   Marijo File CMA on 12/06/2008   Method used:   Telephoned to ...       [facility] [address] [phone]* (retail)       [address]       [place], [place]  [address]       Ph: [phone] or [phone]       Fax: [phone]   RxID:   [id number] NORVASC 10 MG  TABS (AMLODIPINE BESYLATE) Take 1 tablet by mouth once a day  #90 x 2   Entered by:   Marijo File CMA   Authorized by:   Michele Mcalpine MD   Signed by:   Marijo File CMA on 12/06/2008   Method used:   Telephoned to ...       [facility] [address] [phone]* (retail)       [address]       [place], [place]  [address]       Ph: [phone] or [phone]       Fax: [phone]   RxID:   [id number]

## 2010-11-25 NOTE — Medication Information (Signed)
Summary: Rx for Norvasc/[facility]  Rx for Norvasc/[facility]   Imported By: Esmeralda Links D'jimraou 12/12/2008 11:53:57  _____________________________________________________________________  External Attachment:    Type:   Image     Comment:   External Document

## 2010-11-25 NOTE — Medication Information (Signed)
Summary: Rx for Protonix/[facility]  Rx for Protonix/[facility]   Imported By: Esmeralda Links D'jimraou 12/12/2008 11:58:42  _____________________________________________________________________  External Attachment:    Type:   Image     Comment:   External Document

## 2010-11-25 NOTE — Assessment & Plan Note (Signed)
Summary: cpx/la   CC:  20 month ROV & CPX....  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 here for a physical exam- doing well without new complaints or concerns...   Current Problem List:  ALLERGIC RHINITIS (ICD-477.9) - eval by DrKozlow et al in 2006... seasonal symptoms treated w/ OTC meds as needed... currently on SINGULAIR 10mg /d, and NASONEX 2sp Qhs...  TINNITUS, CHRONIC (ICD-388.30) - eval by DrKraus in the past... chronic symptom- deals with it effectively using white noise etc...  HYPERTENSION (ICD-401.9) - controlled on meds- ASA 81mg /d,  NORVASC 10mg /d,  ACCUPRIL 10mg /d,  HCTZ 25mg /d,  KCl Qod...  BP= 138/70 today, takes meds regularly & tol well w/o side effects... BP checks at home and work are similar... denies HA, fatigue, visual changes, CP, palipit, dizziness, syncope, dyspnea, edema, etc...   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 eval by cardiology in the 80's & no valvular heart disease... he had a Cardiolite in 2005 that was neg- no ischemia & EF=60%... he was hosp w/ CP episode 3/08 by DrBBrodie- felt to be prob pericarditis... he had a Coronary CTA showing sl plaque at an LAD branch vessel (D2), mixed plaque in CIRC, no plaque in prox-mid RCA; Calcium score 0.65 (<25th percentlie)... 2DEcho 3/08 showed no signif valvular lesions, LVwall thickness at upper limit, no regional wall motion abn, LVF= 50-55%...  HYPERCHOLESTEROLEMIA, BORDERLINE (ICD-272.4) - he started taking Lipitor prev due to reports of poss benefits in Alzheimer's prevention... currently on LIPITOR 40mg /d, and LOVAZA 4gm/d...  ~  FLP 4/08 on Lipitor40 showed TChol 138, TG 45, HDL 49, LDL 80  ~  FLP 9/09 on ?diet alone showed TChol 171, TG 54, HDL 56, LDL 104  ~  FLP 3/11 on Lip40+Lovaza showed TChol 124, TG 51, HDL 63, LDL 51  GERD (ICD-530.81) - he has been evaluated by [facility]... takes PROTONIX 40mg /d...  COLONIC  POLYPS, HX OF (ICD-V12.72) - colonoscopy 4/04 by ZOXWRUEA w/ diminutive hyperplastic polyp removed & sm int hem...  f/u colonoscopy done 5/11 was neg- no polyps...  Hx of SPONDYLOSIS (ICD-721.90) - He is s/p C6-7 anterior cervical diskectomy w/ bone allograft & plating 12/03 by Lenon Oms... 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 also lumbar spondy w/ spurs at L4-5 seen on prev CTAbd done 12/06...  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 followed closely by DrPatMauro at [facility] & no known recurrence (switching to [facility]- DrJones)...  ALZHEIMER'S DISEASE, FAMILY HX (ICD-V17.0) - family hx of Alzheimer's disease in both his mother and father (both died in their 74's w/ this disease)...   Allergies: 1)  ! Avelox (Moxifloxacin Hcl)  Past History:  Past Medical History: ALLERGIC RHINITIS (ICD-477.9) TINNITUS, CHRONIC (ICD-388.30) HYPERTENSION (ICD-401.9) Hx of RHEUMATIC FEVER (ICD-390) HYPERCHOLESTEROLEMIA, BORDERLINE (ICD-272.4) GERD (ICD-530.81) COLONIC POLYPS, HX OF (ICD-V12.72) Hx of SPONDYLOSIS (ICD-721.90) ALZHEIMER'S DISEASE, FAMILY HX (ICD-V17.0) Hx of MELANOMA (ICD-172.9)  Past Surgical History: S/P wide excision of melanoma on abd wall w/ sentinel node biopsy 8/02 by DrPYoung S/P C6-7 anterior cervical diskectomy w/ patellar allograft and plating 12/03 by Lenon Oms  Family History: Reviewed history from 07/19/2008 and no changes required. Father died age 82 from prob subdural hematoma, alzheimers Mother deceased age 50  w/  hx of alzheimers 3 Siblings: 1 Brother, 2 Sisters- ages 58-71 in good general health  Social History: Reviewed history from 07/19/2008 and no changes required. Married - wife= [name] 2 children- Tax adviser, Son= physician non  smoker exercises 3 times per week caffeine use:  2 cups/d etoh use:  1-2 glasses of wine nightly  Review of Systems  The patient denies fever, chills, sweats, anorexia, fatigue, weakness, malaise, weight loss, sleep disorder, blurring, diplopia, eye irritation, eye discharge, vision loss, eye pain, photophobia, earache, ear discharge, tinnitus, decreased hearing, nasal congestion, nosebleeds, sore throat, hoarseness, chest pain, palpitations, syncope, dyspnea on exertion, orthopnea, PND, peripheral edema, cough, dyspnea at rest, excessive sputum, hemoptysis, wheezing, pleurisy, nausea, vomiting, diarrhea, constipation, change in bowel habits, abdominal pain, melena, hematochezia, jaundice, gas/bloating, indigestion/heartburn, dysphagia, odynophagia, dysuria, hematuria, urinary frequency, urinary hesitancy, nocturia, incontinence, back pain, joint pain, joint swelling, muscle cramps, muscle weakness, stiffness, arthritis, sciatica, restless legs, leg pain at night, leg pain with exertion, rash, itching, dryness, suspicious lesions, paralysis, paresthesias, seizures, tremors, vertigo, transient blindness, frequent falls, frequent headaches, difficulty walking, depression, anxiety, memory loss, confusion, cold intolerance, heat intolerance, polydipsia, polyphagia, polyuria, unusual weight change, abnormal bruising, bleeding, enlarged lymph nodes, urticaria, allergic rash, hay fever, and recurrent infections.    Vital Signs:  Patient profile:   65 year old male Height:      70 inches Weight:      181 pounds BMI:     26.06 O2 Sat:      99 % on Room air Temp:     97.6 degrees F oral Pulse rate:   76 / minute BP sitting:   138 / 70  (left arm)  Vitals Entered By: Renold Genta RCP, LPN (Mar 05, 2010 8:49 AM)  O2 Flow:  Room air CC: 20 month ROV & CPX...   Physical Exam  Additional Exam:  WD, WN, 65 y/o WM in NAD... GENERAL:  Alert & oriented; pleasant & cooperative... HEENT:  [place]/AT, EOM-wnl,  PERRLA, EACs-clear, TMs-wnl, NOSE-clear, THROAT-clear & wnl. NECK:  Supple w/ fairROM; no JVD; normal carotid impulses w/o bruits; no thyromegaly or nodules palpated; no lymphadenopathy. CHEST:  Clear to P & A; without wheezes/ rales/ or rhonchi. HEART:  Regular Rhythm; without murmurs/ rubs/ or gallops. ABDOMEN:  Soft & nontender; right peri-umbil scar of melanoma surg; normal bowel sounds; no organomegaly or masses palpated. RECTAL:  Neg - prostate 2+ & nontender w/o nodules; stool hematest neg. EXT: without deformities or arthritic changes; no varicose veins/ +venous insuffic/ no edema. NEURO:  CN's intact; motor testing normal; sensory testing normal; gait normal & balance OK. DERM:  No lesions noted; no rash etc...    EKG  Procedure date:  03/05/2010  Findings:      Normal sinus rhythm with rate of:  82/ min... Tracing shows NSR, minor NSSTTWA, NAD...  SN   MISC. Report  Procedure date:  03/05/2010  Findings:      FASTING LABS were done 01/16/10 at [facility] & reviewed w/ pt...  SN   Impression & Recommendations:  Problem # 1:  PHYSICAL EXAMINATION (ICD-V70.0)  Orders: No Charge Patient Arrived (NCPA0) (NCPA0)  Problem # 2:  HYPERTENSION (ICD-401.9) Controlled on meds>  continue same. His updated medication list for this problem includes:    Norvasc 10 Mg Tabs (Amlodipine besylate) .Marland Kitchen... Take 1 tablet by mouth once a day    Accupril 10 Mg Tabs (Quinapril hcl) .Marland Kitchen... Take 1 tablet by mouth once a day  Hydrochlorothiazide 25 Mg Tabs (Hydrochlorothiazide) .Marland Kitchen... 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  Problem # 4:  HYPERCHOLESTEROLEMIA, BORDERLINE (ICD-272.4) Controlled on meds>  continue same. His updated medication list for this problem includes:    Lipitor 40 Mg Tabs (Atorvastatin calcium) .Marland Kitchen... Take 1 tab by mouth once daily...    Lovaza 1 Gm Caps (Omega-3-acid ethyl  esters) .Marland Kitchen... Take 2 caps by mouth two times a day...  Problem # 5:  GERD (ICD-530.81) Stable on the PPI Rx... His updated medication list for this problem includes:    Protonix 40 Mg Pack (Pantoprazole sodium) .Marland Kitchen... Take 1 tablet by mouth once a day  Problem # 6:  COLONIC POLYPS, HX OF (ICD-V12.72) Had f/u colon by [facility] 5/11... WNL, no polyps...  Problem # 7:  Hx of SPONDYLOSIS (ICD-721.90) No signif LBP etc... doing satis...  Problem # 8:  OTHER MEDICAL PROBLEMS AS NOTED>>>  Complete Medication List: 1)  Nasonex 50 Mcg/act Susp (Mometasone furoate) .... Two puffs each nostril daily 2)  Singulair 10 Mg Tabs (Montelukast sodium) .Marland Kitchen.. 1 by mouth once daily 3)  Low-dose Aspirin 81 Mg Tabs (Aspirin) .... Take 1 tablet by mouth once a day 4)  Norvasc 10 Mg Tabs (Amlodipine besylate) .... Take 1 tablet by mouth once a day 5)  Accupril 10 Mg Tabs (Quinapril hcl) .... Take 1 tablet by mouth once a day 6)  Hydrochlorothiazide 25 Mg Tabs (Hydrochlorothiazide) .... Take 1 tablet by mouth once a day 7)  Klor-con M20 20 Meq Tbcr (Potassium chloride crys cr) .... Take 1 tablet by mouth once a day 8)  Lipitor 40 Mg Tabs (Atorvastatin calcium) .... Take 1 tab by mouth once daily.Marland KitchenMarland Kitchen 9)  Lovaza 1 Gm Caps (Omega-3-acid ethyl esters) .... Take 2 caps by mouth two times a day... 10)  Protonix 40 Mg Pack (Pantoprazole sodium) .... Take 1 tablet by mouth once a day 11)  Ambien 10 Mg Tabs (Zolpidem tartrate) .... Take 1 tab by mouth at bedtime as needed for sleep...  Patient Instructions: 1)  Today we updated your med list- see below.... 2)  we refillled your meds for 2011... 3)  Lathon, you look great... keep up the good work...  4)  Call for any problems.Marland KitchenMarland Kitchen 5)  Please schedule a follow-up appointment in 1 year. Prescriptions: AMBIEN 10 MG TABS (ZOLPIDEM TARTRATE) take 1 tab by mouth at bedtime as needed for sleep...  #30 x prn   Entered and Authorized by:   Michele Mcalpine MD   Signed by:   Michele Mcalpine MD on 03/05/2010   Method used:   Print then Give to Patient   RxID:   [id number] PROTONIX 40 MG  PACK (PANTOPRAZOLE SODIUM) Take 1 tablet by mouth once a day  #90 x prn   Entered and Authorized by:   Michele Mcalpine MD   Signed by:   Michele Mcalpine MD on 03/05/2010   Method used:   Print then Give to Patient   RxID:   [id number] LOVAZA 1 GM CAPS (OMEGA-3-ACID ETHYL ESTERS) take 2 caps by mouth two times a day...  #360 x prn   Entered and Authorized by:   Michele Mcalpine MD   Signed by:   Michele Mcalpine MD on 03/05/2010   Method used:   Print then Give to Patient   RxID:   [id number] LIPITOR  40 MG TABS (ATORVASTATIN CALCIUM) take 1 tab by mouth once daily...  #90 x prn   Entered and Authorized by:   Michele Mcalpine MD   Signed by:   Michele Mcalpine MD on 03/05/2010   Method used:   Print then Give to Patient   RxID:   [id number] KLOR-CON M20 20 MEQ  TBCR (POTASSIUM CHLORIDE CRYS CR) Take 1 tablet by mouth once a day  #90 x prn   Entered and Authorized by:   Michele Mcalpine MD   Signed by:   Michele Mcalpine MD on 03/05/2010   Method used:   Print then Give to Patient   RxID:   [id number] HYDROCHLOROTHIAZIDE 25 MG  TABS (HYDROCHLOROTHIAZIDE) Take 1 tablet by mouth once a day  #90 x prn   Entered and Authorized by:   Michele Mcalpine MD   Signed by:   Michele Mcalpine MD on 03/05/2010   Method used:   Print then Give to Patient   RxID:   [id number] ACCUPRIL 10 MG  TABS (QUINAPRIL HCL) Take 1 tablet by mouth once a day  #90 x prn   Entered and Authorized by:   Michele Mcalpine MD   Signed by:   Michele Mcalpine MD on 03/05/2010   Method used:   Print then Give to Patient   RxID:   [id number] NORVASC 10 MG  TABS (AMLODIPINE BESYLATE) Take 1 tablet by mouth once a day  #90 x prn   Entered and Authorized by:   Michele Mcalpine MD   Signed by:   Michele Mcalpine MD on 03/05/2010   Method used:   Print then Give to Patient   RxID:   [id number] SINGULAIR 10 MG  TABS  (MONTELUKAST SODIUM) 1 by mouth once daily  #90 x prn   Entered and Authorized by:   Michele Mcalpine MD   Signed by:   Michele Mcalpine MD on 03/05/2010   Method used:   Print then Give to Patient   RxID:   [id number] NASONEX 50 MCG/ACT  SUSP (MOMETASONE FUROATE) Two puffs each nostril daily  #3 x prn   Entered and Authorized by:   Michele Mcalpine MD   Signed by:   Michele Mcalpine MD on 03/05/2010   Method used:   Print then Give to Patient   RxID:   [id number]      Other Screening   PSA: 1.24  (07/16/2008)   Smoking status: never  (07/19/2008)        CardioPerfect ECG  ID: [id number] Patient: Peter [name], Peter [name] DOB: 26-Nov-1945 Age: 65 Years Old Sex: Male Race: White Physician: Kriste Basque Technician: Reynaldo Minium CMA Height: 70 Weight: 181 Has Pacemaker? No Status: Unconfirmed Past Medical History:    ALLERGIC RHINITIS (ICD-477.9) TINNITUS, CHRONIC (ICD-388.30) HYPERTENSION (ICD-401.9) Hx of RHEUMATIC FEVER (ICD-390) HYPERCHOLESTEROLEMIA, BORDERLINE (ICD-272.4) GERD (ICD-530.81) COLONIC POLYPS, HX OF (ICD-V12.72) Hx of SPONDYLOSIS (ICD-721.90) ALZHEIMER'S DISEASE, FAMILY HX (ICD-V17.0) Hx of MELANOMA (ICD-172.9)   Recorded: 03/05/2010 09:00 AM P/PR: 118 ms / 150 ms - Heart rate (maximum exercise) QRS: 100 QT/QTc/QTd: 403 ms / 442 ms / 49 ms - Heart rate (maximum exercise)  P/QRS/T axis: 55 deg / 51 deg / -8 deg - Heart rate (maximum exercise)  Heartrate: 82 bpm  Interpretation:  Normal sinus rhythm with rate of:  82/ min... Tracing shows NSR, minor NSSTTWA, NAD...  SN

## 2010-11-25 NOTE — Letter (Signed)
Summary: [facility]  [facility]   Imported By: Lennie Odor 07/23/2010 16:54:56  _____________________________________________________________________  External Attachment:    Type:   Image     Comment:   External Document

## 2010-11-25 NOTE — Medication Information (Signed)
Summary: Rx for Klor Con and Singulair/IPS  Rx for Klor Con and Singulair/IPS   Imported By: Esmeralda Links D'jimraou 03/02/2008 15:20:33  _____________________________________________________________________  External Attachment:    Type:   Image     Comment:   External Document

## 2010-11-27 NOTE — Letter (Signed)
Summary: [facility]  [facility]   Imported By: Lennie Odor 11/06/2010 17:02:58  _____________________________________________________________________  External Attachment:    Type:   Image     Comment:   External Document

## 2010-11-27 NOTE — Letter (Signed)
Summary: [facility] [place]   Imported By: Sherian Rein 11/12/2010 07:58:36  _____________________________________________________________________  External Attachment:    Type:   Image     Comment:   External Document

## 2010-11-27 NOTE — Letter (Signed)
Summary: [facility]umatology [facility] Immunology/[facility]  Rheumatology & Immunology/[facility]   Imported By: Sherian Rein 11/07/2010 13:54:58  _____________________________________________________________________  External Attachment:    Type:   Image     Comment:   External Document

## 2010-12-16 ENCOUNTER — Ambulatory Visit
Admission: RE | Admit: 2010-12-16 | Discharge: 2010-12-16 | Disposition: A | Discharge status: Home / Self Care | Payer: PRIVATE HEALTH INSURANCE | Source: Ambulatory Visit | Attending: Orthopaedic Surgery | Admitting: Orthopaedic Surgery

## 2010-12-16 ENCOUNTER — Other Ambulatory Visit: Payer: Self-pay | Admitting: Orthopaedic Surgery

## 2010-12-16 DIAGNOSIS — M79606 Pain in leg, unspecified: Secondary | ICD-10-CM

## 2010-12-16 DIAGNOSIS — M545 Low back pain, unspecified: Secondary | ICD-10-CM

## 2010-12-16 DIAGNOSIS — M25559 Pain in unspecified hip: Secondary | ICD-10-CM

## 2011-01-02 ENCOUNTER — Other Ambulatory Visit: Payer: Self-pay | Admitting: Orthopaedic Surgery

## 2011-01-02 DIAGNOSIS — M549 Dorsalgia, unspecified: Secondary | ICD-10-CM

## 2011-01-05 ENCOUNTER — Other Ambulatory Visit: Payer: PRIVATE HEALTH INSURANCE

## 2011-01-13 ENCOUNTER — Other Ambulatory Visit: Payer: Self-pay | Admitting: *Deleted

## 2011-01-13 DIAGNOSIS — F419 Anxiety disorder, unspecified: Secondary | ICD-10-CM

## 2011-01-13 DIAGNOSIS — R110 Nausea: Secondary | ICD-10-CM

## 2011-01-13 MED ORDER — ALPRAZOLAM 0.5 MG PO TABS: ORAL_TABLET | ORAL | Status: DC

## 2011-01-13 MED ORDER — PROMETHAZINE HCL 25 MG PO TABS: 25.0000 mg | ORAL_TABLET | Freq: Four times a day (QID) | ORAL | Status: AC | PRN

## 2011-01-13 NOTE — Telephone Encounter (Signed)
Pt is aware of medications faxed to the pharmacy and Dr. Kriste Basque will call patient this afternoon.

## 2011-01-22 ENCOUNTER — Telehealth: Payer: Self-pay | Admitting: Pulmonary Disease

## 2011-01-22 NOTE — Telephone Encounter (Signed)
Message has been printed and placed on SN cart for him to return call to [name]. Will sign off of phone note.

## 2011-02-09 ENCOUNTER — Ambulatory Visit: Payer: PRIVATE HEALTH INSURANCE | Attending: Neurology | Admitting: Physical Therapy

## 2011-02-09 DIAGNOSIS — M545 Low back pain, unspecified: Secondary | ICD-10-CM | POA: Insufficient documentation

## 2011-02-09 DIAGNOSIS — IMO0001 Reserved for inherently not codable concepts without codable children: Secondary | ICD-10-CM | POA: Insufficient documentation

## 2011-02-09 DIAGNOSIS — M25559 Pain in unspecified hip: Secondary | ICD-10-CM | POA: Insufficient documentation

## 2011-02-11 ENCOUNTER — Ambulatory Visit: Payer: PRIVATE HEALTH INSURANCE

## 2011-02-13 ENCOUNTER — Encounter: Payer: Self-pay | Admitting: Pulmonary Disease

## 2011-02-20 ENCOUNTER — Encounter: Payer: Self-pay | Admitting: Pulmonary Disease

## 2011-02-23 ENCOUNTER — Ambulatory Visit: Payer: PRIVATE HEALTH INSURANCE | Admitting: Pulmonary Disease

## 2011-02-23 ENCOUNTER — Ambulatory Visit: Payer: PRIVATE HEALTH INSURANCE

## 2011-03-12 ENCOUNTER — Other Ambulatory Visit: Payer: Self-pay | Admitting: Pulmonary Disease

## 2011-03-13 NOTE — Op Note (Signed)
[place]. [facility]  Patient:    DANTA, BAUMGARDNER                         MRN: [id number] Proc. Date: 05/26/01 Attending:  Janalyn Rouse                           Operative Report  PREOPERATIVE DIAGNOSIS:  Melanoma of the abdominal wall.  POSTOPERATIVE DIAGNOSIS:  Melanoma of the abdominal wall.  PROCEDURES: 1. Wide excision of melanoma with primary closure. 2. Bilateral inguinal sentinel lymph node biopsy.  SURGEON:  Rose Phi. Maple Hudson, [name]  ANESTHESIA:  General.  INDICATION FOR PROCEDURE:  This patient had a previous melanoma excised from just the right side of the abdomen below the umbilicus that was a 0.33 mm thick melanoma without ulceration or satellitosis but with signs of regression.  It had been suggested that sentinel node biopsy would be appropriate.  Preoperatively he had a lymphoscintigram done, which showed prompt passage of the tracer into the right inguinal area and also a little more delayed tracer into the left inguinal area, so it would appear that he had bilateral inguinal sentinel nodes.  DESCRIPTION OF PROCEDURE:  After suitable general anesthesia was induced, the patient was placed in the supine position.  We carefully scanned both inguinal areas and marked hot spots on both sides, which are right above the crease in between the leg and the lower abdomen.  Both of these were marked. Lymphazurin blue 1 cc was injected intradermally around the melanoma, and then we prepped the abdomen and the groins.  We did the right side first with a short vertical incision overlying the hot spot.  We dissected through the subcutaneous tissue and with a retractor in, we identified a hot and blue lymph node, which I excised.  There were no other hot or blue lymph nodes, and there was only one.  That wound was then closed with 3-0 Vicryl and a subcuticular 4-0 Monocryl and Steri-Strips after injecting it with 0.25% Marcaine.  The left  inguinal area was then handled in a similar fashion, although the node was a little less blue and with lower counts.  The wound was closed in a similar fashion, and we also injected the Marcaine.  We then marked a 1 cm margin around the melanoma biopsy site with a vertical orientation.  I then excised it down to the fascia.  Bleeders were coagulated. Subcutaneous tissue was then closed with 3-0 Vicryl and the skin with interrupted 4-0 nylon sutures.  Dressings were applied.  Patient transferred to the recovery room in satisfactory condition, having tolerated the procedure well. DD:  05/26/01 TD:  05/27/01 Job: [id number]

## 2011-03-13 NOTE — H&P (Signed)
NAME:  Peter [name], Peter [name] NO.:  [id number]   MEDICAL RECORD NO.:  [id number]          PATIENT TYPE:  INP   LOCATION:  2011                         FACILITY:  [facility]   PHYSICIAN:  Garrison Columbus. Haithcock, MDDATE OF BIRTH:  1946-06-17   DATE OF ADMISSION:  01/20/2007  DATE OF DISCHARGE:                              HISTORY & PHYSICAL   CHIEF COMPLAINT:  Chest pressure.   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  This was followed by constant, nonradiating, substernal chest  pain which has continued for 6 hours.  It was associated with an  increased heart rate and palpitations and the pain seemed worse with  deep breaths.  There is no positional relationship to the pain.  He has  never had these symptoms before.   PAST MEDICAL HISTORY:  Significant for hypertension.   MEDICATIONS:  Accupril, hydrochlorothiazide, Norvasc, Vytorin.   SOCIAL HISTORY:  He lives in [place] with his wife.   FAMILY HISTORY:  Mother died of complications of Alzheimer's.  Father  died of complications of a fall and old age.   REVIEW OF SYSTEMS:  Is otherwise negative.   PHYSICAL EXAMINATION:  Temperature is 98.7, pulse is 84 now, respiratory  rate is 18.  GENERAL:  No apparent distress.  HEENT EXAM:  Normocephalic, atraumatic.  Pupils equally round and  reactive to light and accommodation.  Extraocular motion is intact.  NECK:  Is supple with no significant jugular venous distension.  Lymphadenopathy is unremarkable.  CARDIOVASCULAR EXAM:  S1/S2 are regular with no murmurs, rubs or  gallops.  PMI is normal.  Pulses are 2+ without bruits.  LUNGS:  Are clear to auscultation without wheezes, rales or rhonchi.  SKIN:  Shows no lesions.  ABDOMEN:  Soft, nontender.  EXTREMITIES:  Show no clubbing, cyanosis or edema.  NEUROLOGIC EXAM:  Was intact.   An EKG was performed while the  patient was in tachycardia.  The  tachycardia has a P-wave axis that is consistent with sinus tachycardia.  The R-wave axis is normal.  There is mild left atrial enlargement.  The  PR interval is 167, QRS of 192, QTC of 407.   LABORATORY DATA:  White count 16, hematocrit 47, platelets 388.  Sodium  138, potassium 3.2, chloride 104, bicarb 30, BUN 17, creatinine 1.   ASSESSMENT/PLAN:  1. This is a 76-day-old gentleman who presents with chest pressure.      The chest pressure is atypical for unstable angina.  However, given      the possibility of risk factors will admit the patient for      telemetry.  Cardiac enzymes x3.  Will continue aspirin, beta-      blocker, statin therapy as well as ACE inhibitor.  Will hold on      heparin until further information is elucidated __________ status.  2. Tachycardia.  The P-wave axis of the tachycardia is most consistent      with a  sinus tachycardia.  While speaking with the patient, I      notice that the tachycardia slowed to a normal sinus rate      gradually.  There was no change in the T-wave morphology.  I do not      believe that this is an atrial tachycardia.  However, it remains a      possibility.  It is most likely this was a sinus tachycardia      perhaps related to an inflammatory response, resulting in the      patient's chest pain.  With regards to other possibilities, the      patient does have evidence of an increased left atrial size on EKG,      and given his history of rheumatic heart disease as a child, an      echocardiogram will be prudent, not only for evaluation of possible      acute coronary syndrome but also for valvular heart disease and      sequelae including tachycardia.  We will therefore, in addition to      the cardiac enzymes as mentioned before, get an echocardiogram as      soon as it is available.      Daniel B. Haithcock, MD  Electronically Signed     DBH/MEDQ  D:  01/20/2007  T:  01/20/2007  Job:   [id number]

## 2011-03-13 NOTE — Discharge Summary (Signed)
NAME:  Peter [name], Peter [name] NO.:  [id number]   MEDICAL RECORD NO.:  [id number]          PATIENT TYPE:  INP   LOCATION:  2011                         FACILITY:  [facility]   PHYSICIAN:  Everardo Beals. Juanda Chance, MD, FACCDATE OF BIRTH:  [birth year]   DATE OF ADMISSION:  01/20/2007  DATE OF DISCHARGE:  01/20/2007                               DISCHARGE SUMMARY   PRIMARY CARDIOLOGIST:  Dr. Charlies Constable.   PRIMARY MD:  Not listed.   PROCEDURES PERFORMED DURING HOSPITALIZATION:  1. Cardiac CT.  2. Echocardiogram.   PRIMARY DIAGNOSES:  1. Pericarditis.  2. Esophagitis.   SECONDARY DIAGNOSIS:  Hypertension.   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  The patient was consistent with substernal chest pain,  nonradiating, continued x6 hours, associated with increased heart rate  and palpitations.  As a result, the patient presented to the emergency  room at approximately midnight for further evaluation.  On arrival, the  patient's blood pressure was 169/86, pulse 121, respirations 20, O2 sat  97%.  The patient was seen and examined by [facility] and  admitted to rule out myocardial infarction.  The patient was placed on  aspirin, Lipitor, home medications of hydrochlorothiazide and Accupril.  The patient was also started on Toradol 30 mg IV q.6 h.  Initial EKG  revealed left atrial enlargement with ST and T-wave abnormality, with  evidence of inferolateral ischemia.  The following morning, the patient  was seen and examined by Dr. Charlies Constable.  A cardiac CT and  echocardiogram were planned.  Cardiac CT revealed coronary calcium  magneton score of 0.65, placing the patient at less than 25% percentile  for sex, age, and match cohort.  The extent of the calcium is felt to be  slightly greater than the  calculated value, and as identified in the  right coronary artery, proximal diagonal, and left circumflex.  The left  circumflex plaque is mixed, but not hemodynamically significant.  Minimally limited evaluation of the distal right coronary artery.  Spondylosis of areas of mild osseous.  Impression was right ventral  thecal sac, small hiatal hernia, with borderline distal esophageal wall  thickening, question esophagitis.   Echocardiogram was also completed on 01/19/2006, revealing overall left  ventricular systolic function within the lower limits of normal.  Left  ventricular ejection fraction was estimated to range between 50 to 55%.  There was no diagnostic evidence of left ventricular regional wall  motion abnormalities.  Left ventricular wall thickness was at the upper  limits of normal.  There was trivial tricuspid valvular regurgitation.  There was minimal pericardial effusion versus epicardial adipose tissue.  The patient was placed on Voltaren 75 mg p.o. daily, and Protonix 40 mg  p.o. daily.  The patient's potassium was found to be mildly borderline  low, and was given additional 40 mEq of potassium the a.m. of discharge.  On day of discharge, the patient  was pain free and anxious to be  discharged.  The patient's family was in attendance, and was seen and  evaluated also by Dr. Tonny Bollman prior to discharge.  A discussion  was held concerning test results, and need for followup with Dr. Dickie La.  It was noted that the patient's D-dimer was also negative.  The patient  verbalized understanding about followup appointments and discharge  medications.   DISCHARGE LABS:  Sodium 138, potassium 3.2, chloride 104, glucose 126,  BUN 17, creatinine 1.0.  Magnesium 2.4, troponin 0.02, 0.01, CK-MB 1.7  and 1.3, respectively.  CK 85 and 78, respectively.  Hemoglobin 16.5,  hematocrit 47.2, white blood cells 16.1, platelets 348.  Vital signs on  discharge:  Blood pressure 124/80, pulse  72, respirations 18,  temperature 97.8, O2 sat 96% on room air, weight 84.3 kg.   FOLLOWUP PLANS AND APPOINTMENTS:  The patient is scheduled to see Dr.  Charlies Constable on April 4 at 4 p.m.   DISCHARGE MEDICATIONS:  1. Voltaren 75 mg 1 p.o. b.i.d.  2. Protonix 40 mg p.o. daily.  3. Norvasc 10 mg 1 p.o. daily.  4. Accupril 10 mg 1 p.o. daily.  5. Hydrochlorothiazide 25 mg p.o. daily.  6. Lipitor 40 mg p.o. daily.  7. Aspirin 81 mg p.o. daily.  8. Potassium 20 mEq p.o. daily.   ALLERGIES:  NO KNOWN DRUG ALLERGIES ARE LISTED.   TIME SPENT WITH THIS PATIENT:  To include physician time, 35 minutes.      Bettey Mare. Lyman Bishop, NP      Everardo Beals. Juanda Chance, MD, [facility]  Electronically Signed    KML/MEDQ  D:  01/20/2007  T:  01/21/2007  Job:  [id number]

## 2011-03-13 NOTE — Assessment & Plan Note (Signed)
[facility]                            CARDIOLOGY OFFICE NOTE   ALEKSEY, NEWBERN                   MRN:          [id number]  DATE:02/17/2007                            DOB:          04-26-1946    PRIMARY CARE PHYSICIAN:  Dr. Lonzo Cloud. Nadel.   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  Chest pain had some pleuritic  components and we were not certain whether this represented unstable  angina or possibly pericarditis.  He had a CT angina to rule out both  pulmonary embolism and coronary artery disease and this showed no  evidence of pulmonary embolism and showed no evidence of coronary artery  disease, although he did have some mild calcification.  He also had a  echocardiogram which showed normal left ventricular function.  We  discharged him on Voltaren and his symptoms resolved and he has done  quite well.  He is back to all his normal activities without any  recurrent symptoms.   PAST MEDICAL HISTORY:  Significant for a melanoma that was removed from  his abdomen a number of years ago.  He also has a history of  hypertension.   CURRENT MEDICATIONS:  1. Voltaren.  2. Accupril.  3. Norvasc.  4. Hydrochlorothiazide.  5. Lipitor.  6. Aspirin.  7. Potassium.  He takes the Lipitor for possible prevention of Alzheimer's since both  of his parents had died of Alzheimer's disease.   EXAMINATION:  Blood pressure is 141/86, pulse 79 and regular.  There is no venous distension. Carotid pulses were full without bruits.  Chest was clear.  Heart rhythm was regular.  I could hear no murmurs or gallops.  The abdomen was soft with normal bowel sounds.  There is a scar in the  lower abdominal wall.  Peripheral pulses were full and there was no peripheral edema.   IMPRESSION:  1. Recent episode of chest pain probably pericarditis now resolved.  2. Hypertension.   RECOMMENDATIONS:  I  think Rhys is doing well.  I think most probably he  had pericarditis since it is resolved.  He has had no recurrence and his  chances of recurrence are small.  I think he can go off the Voltaren at  present.  His blood pressure is not quite optimal and he is going to  keep track of that and I told him if it is over 130/80 then we probably  need to make some  adjustments.  He is going to continue working on medical means including  exercise, weight loss and low salt diet.  I will plan to see him back on  a p.r.n. basis.     Bruce Elvera Lennox Juanda Chance, MD, [facility]  Electronically Signed    BRB/MedQ  DD: 02/17/2007  DT: 02/17/2007  Job #: [id number]

## 2011-03-13 NOTE — Op Note (Signed)
NAME:  Peter [name], Peter [name] NO.:  [id number]   MEDICAL RECORD NO.:  [id number]                   PATIENT TYPE:  INP   LOCATION:  3004                                 FACILITY:  [facility]   PHYSICIAN:  Hewitt Shorts, [name]            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   POSTOPERATIVE DIAGNOSES:  C6-7 cervical disk herniation, cervical  spondylosis, cervical degenerative disk disease, and cervical radiculopathy.   PROCEDURE:  C6-7 anterior cervical diskectomy and arthrodesis with  unicortical patellar allograft and Tether cervical plating.   SURGEON:  Hewitt Shorts, [name]   ASSISTANT:  Stefani Dama, [name]   ANESTHESIA:  General endotracheal.   INDICATIONS:  The patient is a 65 year old man who presented with acute  right cervical radiculopathy secondary to a large C6-7 cervical disk  herniation.  A decision was made to proceed with a single-level anterior  cervical diskectomy and arthrodesis.   DESCRIPTION OF PROCEDURE:  The patient was brought to the operating room and  placed under general endotracheal anesthesia.  The patient was placed in 10  pounds of Holter traction.  The neck was prepped with Betadine soap and  solution and draped in a sterile fashion.  A horizontal incision was made in  the left side of the neck.  The line of the incision was infiltrated with  local anesthetic with epinephrine.  Dissection was carried down through  subcutaneous tissue and platysma.  Bipolar cautery was used to maintain  hemostasis.  Dissection was carried out through an avascular plane, leaving  the sternocleidomastoid, carotid artery, and jugular vein laterally and the  trachea and esophagus medially.  The ventral  aspect of the vertebral column  was identified and a localizing x-ray taken and the C6-7 intervertebral disk  space identified.  Diskectomy was begun with incision in the annulus and  continued with microcurettes and pituitary rongeurs.  The cartilaginous end  plates and the corresponding vertebrae were removed using microcurettes and  the Micro-Max drill.  The microscope was draped and brought into the field  to provide additional magnification, illumination, and visualization, and  the remainder of the procedure was performed using microdissection and  microsurgical technique.  Posterior osteophytic overgrowth was removed using  the Micro-Max drill as well as a 2 mm Kerrison punch with thin foot plate.  The disk herniation, which was quite large, was encountered and gradually  removed in a piecemeal fashion.  We were able to decompress the thecal sac.  Posterior longitudinal ligament was carefully removed, and foraminotomies  were performed bilaterally.  We were able to decompress the thecal sac and  the nerve roots bilaterally, and all loose fragments of disk material  were  removed from both the disk space and the epidural space.  Once the  decompression was completed and hemostasis was established with the of  Gelfoam soaked in thrombin, the Gelfoam was removed and then we proceeded  with the arthrodesis.  We selected a wedge of unicortical patellar allograft  9 mm in height.  It was further shaped with a Micro-Max drill and then the  graft was placed in the intervertebral disk space and counter sunk.  Anterior osteophytic overgrowth was then removed.  Then we selected a 16 mm  Tether cervical plate.  It was positioned over the fusion construct and  secured to each of the vertebrae with a pair of 4.0 x 14 mm screws.  Each  screw hole was drilled and tapped and screws were placed in an alternating  fashion.  Once all four screws were in place, final tightening was done.  An  x-ray was  taken.  We could visualize the screws at C6 as well as the graft  but not the screws at C7.  The screws at C6 were in good position, the graft  was in good position, and the L4 alignment was good.   The wound was irrigated with bacitracin solution and checked for hemostasis,  which was established and confirmed, and then we proceeded with closure.  The platysma was closed with interrupted, inverted 2-0 undyed Vicryl  sutures, the subcutaneous and subcuticular layer were closed with  interrupted, inverted 3-0 undyed Vicryl sutures, and the skin was  reapproximated with Dermabond.  The patient tolerated the procedure well.  The estimated blood loss was 75 cc.  Sponge and needle count were correct.  Following surgery the patient, who had been taken out of cervical traction  once the bone graft was placed and prior to the plating, was then reversed  from the anesthetic, extubated, and transferred to the recovery room for  further care, where he was noted to be moving all four extremities to  command.                                               Hewitt Shorts, [name]    RWN/MEDQ  D:  09/30/2002  T:  10/01/2002  Job:  [id number]

## 2011-03-26 ENCOUNTER — Other Ambulatory Visit: Payer: Self-pay | Admitting: Pulmonary Disease

## 2011-03-26 DIAGNOSIS — Z0000 Encounter for general adult medical examination without abnormal findings: Secondary | ICD-10-CM | POA: Insufficient documentation

## 2011-03-26 MED ORDER — HYDROCOD POLST-CHLORPHEN POLST 10-8 MG/5ML PO LQCR: 5.0000 mL | Freq: Two times a day (BID) | ORAL | Status: DC

## 2011-03-27 ENCOUNTER — Telehealth: Payer: Self-pay | Admitting: Pulmonary Disease

## 2011-03-27 ENCOUNTER — Other Ambulatory Visit: Payer: Self-pay | Admitting: *Deleted

## 2011-03-27 MED ORDER — CEFACLOR 250 MG PO CAPS: 250.0000 mg | ORAL_CAPSULE | Freq: Two times a day (BID) | ORAL | Status: AC

## 2011-03-27 MED ORDER — CEFACLOR ER 500 MG PO TB12: 500.0000 mg | ORAL_TABLET | Freq: Two times a day (BID) | ORAL | Status: DC

## 2011-03-27 NOTE — Telephone Encounter (Signed)
i called the pharmacy and changed the ceclor to 250mg    1 bid and pt is aware.

## 2011-04-09 ENCOUNTER — Ambulatory Visit
Admission: RE | Admit: 2011-04-09 | Discharge: 2011-04-09 | Disposition: A | Discharge status: Home / Self Care | Payer: PRIVATE HEALTH INSURANCE | Source: Ambulatory Visit | Attending: Cardiology | Admitting: Cardiology

## 2011-04-09 ENCOUNTER — Ambulatory Visit (INDEPENDENT_AMBULATORY_CARE_PROVIDER_SITE_OTHER): Payer: PRIVATE HEALTH INSURANCE | Admitting: Cardiology

## 2011-04-09 ENCOUNTER — Other Ambulatory Visit: Payer: Self-pay | Admitting: Cardiology

## 2011-04-09 ENCOUNTER — Encounter: Payer: Self-pay | Admitting: Cardiology

## 2011-04-09 VITALS — BP 120/94 | HR 68 | Resp 18 | Ht 69.0 in | Wt 185.8 lb

## 2011-04-09 DIAGNOSIS — R55 Syncope and collapse: Secondary | ICD-10-CM

## 2011-04-09 DIAGNOSIS — R42 Dizziness and giddiness: Secondary | ICD-10-CM

## 2011-04-09 DIAGNOSIS — R079 Chest pain, unspecified: Secondary | ICD-10-CM | POA: Insufficient documentation

## 2011-04-09 NOTE — Progress Notes (Signed)
HPI The patient presents today for the evaluation of some dizziness with possible presyncope.  He is a very healthy active physician.  The patient had had chest pain in 2008.  Echocardiography revealed normal LV function and no valvular abnormalities.  He had a coronary CT angiogram that showed very slight plaque.  His calcium score was 0.65 which is extremely low.  He has not had any chest pain or shortness of breath.  On June 13 he did not feel well in general.  He had brief episode when sitting up feeling like he might have the beginning of a syncopal episode.  This resolved.  He then continued to feel poorly in general.  Today he felt "not himself" this morning and then began to feel better.  He had no more palpitations.  When he arrived here at the office he felt fine. Allergies  Allergen Reactions  . Kenalog   . Moxifloxacin     REACTION: pt had severe red rash- he thought from Avel[facility] Rx....    Current Outpatient Prescriptions  Medication Sig Dispense Refill  . amLODipine (NORVASC) 10 MG tablet Take 10 mg by mouth daily.        Marland Kitchen aspirin 81 MG tablet Take 81 mg by mouth daily.        . celecoxib (CELEBREX) 200 MG capsule Take 200 mg by mouth 2 (two) times daily.        . chlorpheniramine-HYDROcodone (TUSSIONEX PENNKINETIC ER) 10-8 MG/5ML LQCR Take 5 mLs by mouth every 12 (twelve) hours. As needed for cough  140 mL  prn  . hydrochlorothiazide 25 MG tablet TAKE 1 TABLET DAILY  90 tablet  49  . mometasone (NASONEX) 50 MCG/ACT nasal spray 2 sprays by Nasal route daily.        . montelukast (SINGULAIR) 10 MG tablet Take 10 mg by mouth as needed.       Marland Kitchen omega-3 acid ethyl esters (LOVAZA) 1 G capsule Take 2 g by mouth 2 (two) times daily.        . pantoprazole (PROTONIX) 40 MG tablet Take 40 mg by mouth daily.        . potassium chloride SA (K-DUR,KLOR-CON) 20 MEQ tablet TAKE 1 TABLET DAILY  90 tablet  49  . quinapril (ACCUPRIL) 10 MG tablet Take 10 mg by mouth daily.        Marland Kitchen zolpidem (AMBIEN)  10 MG tablet Take 10 mg by mouth at bedtime as needed. For sleep       . DISCONTD: propranolol (INDERAL) 40 MG tablet Take as directed       . DISCONTD: acyclovir (ZOVIRAX) 200 MG capsule Take by mouth 4 (four) times daily.        Marland Kitchen DISCONTD: ALPRAZolam (XANAX) 0.5 MG tablet Take 1/2 to 1 tablet by mouth three times daily prn  90 tablet  5  . DISCONTD: atorvastatin (LIPITOR) 40 MG tablet Take 40 mg by mouth daily.        Marland Kitchen DISCONTD: diclofenac (VOLTAREN) 75 MG EC tablet Take 75 mg by mouth 2 (two) times daily. With food as needed for arthritis pain       . DISCONTD: meloxicam (MOBIC) 7.5 MG tablet Take 1-2 tabs by mouth once daily as directed         History   Social History  . Marital Status: Married    Spouse Name: N/A    Number of Children: 2  . Years of Education: N/A   Occupational History  .  RADIOLOGIST    Social History Main Topics  . Smoking status: Never Smoker   . Smokeless tobacco: Not on file  . Alcohol Use: Yes     3 x a week  . Drug Use: No  . Sexually Active: Not on file   Other Topics Concern  . Not on file   Social History Narrative   Married - wife = [name]2 children- Daughter=lawyer, Son=physicianNon smokerExercises 3 times per [employer] use: 2 cups/detoh use: 1-2 glasses of wine nightly    Family History  Problem Relation Age of Onset  . Alzheimer's disease Father   . Alzheimer's disease Mother     Past Medical History  Diagnosis Date  . Allergic rhinitis   . Tinnitus     chronic  . Hypertension   . History of rheumatic fever   . Hypercholesteremia     borderline  . GERD (gastroesophageal reflux disease)   . History of colonic polyps   . DJD (degenerative joint disease)   . History of pseudogout   . Spondylosis   . Family history of Alzheimer's disease   . History of melanoma   . Chest pain     Nuclear 2005, normal / coronary CTA 2008, slight mixed plaque, coronary calcium score 0.65, ejection fraction 55%, echo, March, 2008  .  Ejection fraction     EF 55%, echo, March, 2008.    Past Surgical History  Procedure Date  . Melanoma excision with sentinel lymph node biopsy 8/02  . Cervical discectomy     anterior with patellar allograft and plating    ROS  Patient denies fever, chills, headache, sweats, rash, change in vision, change in hearing, chest pain, cough, nausea vomiting, urinary symptoms.  All other systems are reviewed and are negative.  PHYSICAL EXAM Patient appears quite stable.  Head is atraumatic.  There is no xanthelasma.  There no carotid bruits.  There is no jugular venous distention.  Lungs are clear.  Respiratory effort is nonlabored.  Cardiac exam reveals an S1-S2.  There no clicks or significant murmurs.  The abdomen is soft.  There is no peripheral edema. Filed Vitals:   04/09/11 1550  BP: 120/94  Pulse: 68  Resp: 18  Height: 5\' 9"  (1.753 m)  Weight: 185 lb 12.8 oz (84.278 kg)    EKG EKG is done and reviewed by me.  It is normal.  ASSESSMENT & PLAN

## 2011-04-09 NOTE — Assessment & Plan Note (Signed)
The etiology of the event on June 13 is not clear.  He did not have definite syncope.  It almost seems that he may have had a limited viral illness causing him to feel poorly over 24 hours.  With this he had an episode of presyncope.  We know historically that he has good left ventricular function.  We know historically that he does not have proven significant coronary disease in the past.  He has not had chest pain.  He's had no significant shortness of breath.  He seems stable to return to work tomorrow.  I have encouraged him to proceed with carotid Dopplers.  I will speak with him again in the next 24 hours and we will decide if we will proceed with echo and stress testing and possibly Holter monitoring.  I felt it was not necessary to arrange these as of today

## 2011-04-10 ENCOUNTER — Other Ambulatory Visit (INDEPENDENT_AMBULATORY_CARE_PROVIDER_SITE_OTHER): Payer: PRIVATE HEALTH INSURANCE

## 2011-04-10 ENCOUNTER — Other Ambulatory Visit: Payer: Self-pay | Admitting: Pulmonary Disease

## 2011-04-10 DIAGNOSIS — Z0000 Encounter for general adult medical examination without abnormal findings: Secondary | ICD-10-CM

## 2011-04-13 ENCOUNTER — Other Ambulatory Visit: Payer: Self-pay | Admitting: Pulmonary Disease

## 2011-04-13 ENCOUNTER — Ambulatory Visit (INDEPENDENT_AMBULATORY_CARE_PROVIDER_SITE_OTHER): Payer: PRIVATE HEALTH INSURANCE

## 2011-04-13 DIAGNOSIS — Z23 Encounter for immunization: Secondary | ICD-10-CM | POA: Insufficient documentation

## 2011-04-13 LAB — LIPID PANEL
Cholesterol: 228 mg/dL — ABNORMAL HIGH (ref 0–200)
HDL: 57.7 mg/dL (ref >39.00)
Total CHOL/HDL Ratio: 4
Triglycerides: 53 mg/dL (ref 0.0–149.0)
VLDL: 10.6 mg/dL (ref 0.0–40.0)

## 2011-04-13 LAB — CBC WITH DIFFERENTIAL/PLATELET
Basophils Absolute: 0.1 10*3/uL (ref 0.0–0.1)
Basophils Relative: 1 % (ref 0.0–3.0)
Eosinophils Absolute: 0.3 10*3/uL (ref 0.0–0.7)
Eosinophils Relative: 3.5 % (ref 0.0–5.0)
HCT: 45.5 % (ref 39.0–52.0)
Hemoglobin: 15.7 g/dL (ref 13.0–17.0)
Lymphocytes Relative: 19.2 % (ref 12.0–46.0)
Lymphs Abs: 1.5 10*3/uL (ref 0.7–4.0)
MCHC: 34.6 g/dL (ref 30.0–36.0)
MCV: 93 fl (ref 78.0–100.0)
Monocytes Absolute: 1 10*3/uL (ref 0.1–1.0)
Monocytes Relative: 12.4 % — ABNORMAL HIGH (ref 3.0–12.0)
Neutro Abs: 5 10*3/uL (ref 1.4–7.7)
Neutrophils Relative %: 63.9 % (ref 43.0–77.0)
Platelets: 321 10*3/uL (ref 150.0–400.0)
RBC: 4.89 Mil/uL (ref 4.22–5.81)
RDW: 12.6 % (ref 11.5–14.6)
WBC: 7.9 10*3/uL (ref 4.5–10.5)

## 2011-04-13 LAB — BASIC METABOLIC PANEL
BUN: 25 mg/dL — ABNORMAL HIGH (ref 6–23)
CO2: 23 mEq/L (ref 19–32)
Calcium: 9.1 mg/dL (ref 8.4–10.5)
Chloride: 98 mEq/L (ref 96–112)
Creatinine, Ser: 0.8 mg/dL (ref 0.4–1.5)
GFR: 99.4 mL/min (ref >60.00)
Glucose, Bld: 88 mg/dL (ref 70–99)
Potassium: 4.6 mEq/L (ref 3.5–5.1)
Sodium: 141 mEq/L (ref 135–145)

## 2011-04-13 LAB — URINALYSIS
Bilirubin Urine: NEGATIVE
Hgb urine dipstick: NEGATIVE
Ketones, ur: NEGATIVE
Leukocytes, UA: NEGATIVE
Nitrite: NEGATIVE
Specific Gravity, Urine: 1.015 (ref 1.000–1.030)
Total Protein, Urine: NEGATIVE
Urine Glucose: NEGATIVE
Urobilinogen, UA: 1 (ref 0.0–1.0)
pH: 7.5 (ref 5.0–8.0)

## 2011-04-13 LAB — TSH: TSH: 1.18 u[IU]/mL (ref 0.35–5.50)

## 2011-04-13 LAB — SEDIMENTATION RATE: Sed Rate: 6 mm/hr (ref 0–22)

## 2011-04-13 LAB — HEPATIC FUNCTION PANEL
ALT: 54 U/L — ABNORMAL HIGH (ref 0–53)
AST: 44 U/L — ABNORMAL HIGH (ref 0–37)
Albumin: 4.5 g/dL (ref 3.5–5.2)
Alkaline Phosphatase: 69 U/L (ref 39–117)
Bilirubin, Direct: 0.1 mg/dL (ref 0.0–0.3)
Total Bilirubin: 1 mg/dL (ref 0.3–1.2)
Total Protein: 7.5 g/dL (ref 6.0–8.3)

## 2011-04-13 LAB — PSA: PSA: 1.32 ng/mL (ref 0.10–4.00)

## 2011-04-13 LAB — LDL CHOLESTEROL, DIRECT: Direct LDL: 151.6 mg/dL

## 2011-04-22 ENCOUNTER — Encounter: Payer: Self-pay | Admitting: Pulmonary Disease

## 2011-04-24 ENCOUNTER — Ambulatory Visit (INDEPENDENT_AMBULATORY_CARE_PROVIDER_SITE_OTHER): Payer: PRIVATE HEALTH INSURANCE | Admitting: Pulmonary Disease

## 2011-04-24 ENCOUNTER — Encounter: Payer: Self-pay | Admitting: Pulmonary Disease

## 2011-04-24 DIAGNOSIS — R42 Dizziness and giddiness: Secondary | ICD-10-CM

## 2011-04-24 DIAGNOSIS — C439 Malignant melanoma of skin, unspecified: Secondary | ICD-10-CM

## 2011-04-24 DIAGNOSIS — K219 Gastro-esophageal reflux disease without esophagitis: Secondary | ICD-10-CM

## 2011-04-24 DIAGNOSIS — Z86010 Personal history of colonic polyps: Secondary | ICD-10-CM

## 2011-04-24 DIAGNOSIS — M479 Spondylosis, unspecified: Secondary | ICD-10-CM

## 2011-04-24 DIAGNOSIS — E785 Hyperlipidemia, unspecified: Secondary | ICD-10-CM

## 2011-04-24 DIAGNOSIS — R079 Chest pain, unspecified: Secondary | ICD-10-CM

## 2011-04-24 DIAGNOSIS — Z0000 Encounter for general adult medical examination without abnormal findings: Secondary | ICD-10-CM

## 2011-04-24 DIAGNOSIS — I10 Essential (primary) hypertension: Secondary | ICD-10-CM

## 2011-04-24 DIAGNOSIS — M1990 Unspecified osteoarthritis, unspecified site: Secondary | ICD-10-CM

## 2011-04-24 MED ORDER — OMEGA-3-ACID ETHYL ESTERS 1 G PO CAPS: 2.0000 g | ORAL_CAPSULE | Freq: Two times a day (BID) | ORAL | Status: DC

## 2011-04-24 MED ORDER — CELECOXIB 200 MG PO CAPS: 200.0000 mg | ORAL_CAPSULE | Freq: Two times a day (BID) | ORAL | Status: DC

## 2011-04-24 MED ORDER — MONTELUKAST SODIUM 10 MG PO TABS: 10.0000 mg | ORAL_TABLET | ORAL | Status: DC | PRN

## 2011-04-24 MED ORDER — MOMETASONE FUROATE 50 MCG/ACT NA SUSP: 2.0000 | Freq: Every day | NASAL | Status: DC

## 2011-04-24 MED ORDER — HYDROCHLOROTHIAZIDE 25 MG PO TABS: 25.0000 mg | ORAL_TABLET | Freq: Every day | ORAL | Status: DC

## 2011-04-24 MED ORDER — ROSUVASTATIN CALCIUM 10 MG PO TABS: 10.0000 mg | ORAL_TABLET | Freq: Every day | ORAL | Status: DC

## 2011-04-24 MED ORDER — PANTOPRAZOLE SODIUM 40 MG PO TBEC: 40.0000 mg | DELAYED_RELEASE_TABLET | Freq: Every day | ORAL | Status: DC

## 2011-04-24 MED ORDER — AMLODIPINE BESYLATE 10 MG PO TABS: 10.0000 mg | ORAL_TABLET | Freq: Every day | ORAL | Status: DC

## 2011-04-24 MED ORDER — POTASSIUM CHLORIDE CRYS ER 20 MEQ PO TBCR: 20.0000 meq | EXTENDED_RELEASE_TABLET | Freq: Every day | ORAL | Status: DC

## 2011-04-24 MED ORDER — QUINAPRIL HCL 10 MG PO TABS: 10.0000 mg | ORAL_TABLET | Freq: Every day | ORAL | Status: DC

## 2011-04-27 ENCOUNTER — Encounter: Payer: Self-pay | Admitting: Pulmonary Disease

## 2011-04-27 NOTE — Progress Notes (Signed)
Subjective:    Patient ID: Peter [name], male    DOB: 02/01/1946, 65 y.o.   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  see prob list below:  ~  June 20, 2010:  Add-on today at pt request for intermittent R>L hip area pain, worse recently & Rx w/ OTC Ibuprofen w/ some help... no prev ortho problems, but mild OA apparent on exam fingers etc... exam shows good ROM w/o pain, non-tender, no skin lesions, etc... we discussed Rx w/ MOBIC, check XRays, & next step= Ortho eval for poss shot in the area.  ~  April 24, 2011:  20mo ROV & CPX> Peter [name] has had a number of medical complaints as outlined in his accompanying note> wife was concerned all this was having a toll & that Peter [name] might be depressed, we discussed this & decided to hold off on any new meds at this point...    He had dizziness episode, near syncope, & saw [facility] 6/12 for Cards eval> EKG NSR, WNL; CDopplers w/ min plaque, no signif stenoses; he did not feel that further eval was warranted...    He's had an excellent Rheum eval from [facility] w/ OA & CPPD> on Colcrys 1.5 tabs daily, plus Celebrex 200mg  Bid prn... He's had Dupytren's injection in hands from DrSypher as well and some triggering of the right 4th digit...    Jowell noted muscle fasciculations & had a thorough eval from [employer] (we don't have his notes)> pt indicates that they are felt to be secondary to his lumbar spondylosis (3 levels) for which he has had several epidural steroid shots & has been checked by [employer]; EMG/ NCV were normal & no signs of upper motor neuron disease...   Objective> CXR 9/09 showed clear, WNL;  EKG= NSR, WNL;  LABS= FLP off Statin showed TChol 228 & LDL 152; Chems all wnl x LFTs borderline elev; CBC wnl; TSH/ PSA/ Sed all wnl...   Problem List:  ALLERGIC RHINITIS (ICD-477.9) - eval by DrKozlow et al in 2006... seasonal symptoms treated w/ OTC meds as needed... currently on SINGULAIR 10mg  prn, and NASONEX 2sp  Qhs...  TINNITUS, CHRONIC (ICD-388.30) - eval by DrKraus in the past... chronic symptom- deals with it effectively using white noise etc...  HYPERTENSION (ICD-401.9) - controlled on meds- ASA 81mg /d,  NORVASC 10mg /d,  ACCUPRIL 10mg /d,  HCTZ 25mg /d,  KCl 20mEq/d...  BP= 134/84 today, takes meds regularly & tol well w/o side effects... BP checks at home and work are similar... denies HA, fatigue, visual changes, CP, palipit, syncope, dyspnea, edema, etc...   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  Hx of CHEST PAIN w/ neg cardiac eval in 2008... EKG is normal> Episode of DIZZINESS/ Presyncope 6/12 w/ eval by Delton See... ~  Cardiolite in 2005 was neg- no ischemia & EF=60%...  ~  he was hosp w/ CP episode 3/08 by DrBBrodie- felt to be prob pericarditis...  ~  Coronary CTA in 2008 showing sl plaque at an LAD branch vessel (D2), mixed plaque in CIRC, no plaque in prox-mid RCA; Calcium score 0.65 (<25th percentlie)...  ~  2DEcho 3/08 showed no signif valvular lesions, LVwall thickness at upper limit, no regional wall motion abn, LVF= 50-55%... ~  CDopplers 6/12 showed min plaque, no signif ICA stenoses...  HYPERCHOLESTEROLEMIA, BORDERLINE (ICD-272.4) - he started taking Lipitor prev due to reports of  poss benefits in Alzheimer's prevention; Prev on LIPITOR 40mg /d and LOVAZA 4gm/d; we stopped the Statin Rx w/ his fasciculations & other symptoms but there was no change off the medication; f/u FLP 6/12 w/ LDL 152 & we decided to try CRESTOR 10mg /d as a trial. ~  FLP 4/08 on Lipitor40 showed TChol 138, TG 45, HDL 49, LDL 80 ~  FLP 9/09 on ?diet alone showed TChol 171, TG 54, HDL 56, LDL 104 ~  FLP 3/11 on Lip40+Lovaza showed TChol 124, TG 51, HDL 63, LDL 51 ~  FLP 6/12 off Lip40 on Lovaza showed TChol 228, TG 53, HDL 58, LDL 152  GERD (ICD-530.81) - he has been evaluated by [facility]... takes PROTONIX  40mg /d...  COLONIC POLYPS, HX OF (ICD-V12.72) - colonoscopy 4/04 by OZHY[facility] w/ diminutive hyperplastic polyp removed & sm int hem...  f/u colonoscopy done 5/11 was neg- no polyps...  DEGENERATIVE JOINT DISEASE (ICD-715.90)> on CELEBREX 200mg  Bid per DrHawkes... Hx of PSEUDOGOUT (ICD-275.49) - he has hx CPPD w/ eval by Rheum here & [facility]> Rx w/ COLCRYS 0.6mg  1-2 daily... ~  8/11: presented w/ c/o R>L hip pain, XRays showed degen arthritis & mild chondrocalcinosis. ~  Subsequent evals by Rheum & [facility] confirmed CPPD  Hx of SPONDYLOSIS (ICD-721.90) - He is s/p C6-7 anterior cervical diskectomy w/ bone allograft & plating 12/03 by Lenon Oms... 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 also lumbar spondy w/ spurs at L4-5 seen on prev CTAbd done 12/06... subseq MRI Lumbar area w/ multilevel spondylosis & several epid steroid shots by IR w/ partial relief...  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 followed closely by DrPatMauro at [facility] & no known recurrence (switching to [facility]- DrJones)...  ALZHEIMER'S DISEASE, FAMILY HX (ICD-V17.0) - family hx of Alzheimer's disease in both his mother and father (both died in their 80's w/ this disease)...  R/O Depression>  6/12 wife shared her concern for Peter [name]'s mood esp as it regards his medical illnesses; after 3way discussion we decided to hold off on any med Rx but will reconsider if needed in follow up...   Past Medical History  Diagnosis Date  . Allergic rhinitis   . Tinnitus     chronic  . Hypertension   . History of rheumatic fever   . Hypercholesteremia     borderline  . GERD (gastroesophageal reflux disease)   . History of colonic polyps   . DJD (degenerative joint disease)   . History of pseudogout   .  Spondylosis   . Family history of Alzheimer's disease   . History of melanoma   . Chest pain     Nuclear 2005, normal / coronary CTA 2008, slight mixed plaque, coronary calcium score 0.65, ejection fraction 55%, echo, March, 2008  . Ejection fraction     EF 55%, echo, March, 2008.  . Dizziness     Dizziness with question of presyncope April 08, 2011    Past Surgical History  Procedure Date  . Melanoma excision with sentinel lymph node biopsy 8/02  . Cervical discectomy     anterior with patellar allograft and plating    Outpatient Encounter Prescriptions as of 04/24/2011  Medication Sig Dispense Refill  . amLODipine (NORVASC) 10 MG tablet Take 1 tablet (10 mg total) by  mouth daily.  90 tablet  4  . aspirin 81 MG tablet Take 81 mg by mouth daily.        . celecoxib (CELEBREX) 200 MG capsule Take 1 capsule (200 mg total) by mouth 2 (two) times daily.  180 capsule  4  . chlorpheniramine-HYDROcodone (TUSSIONEX PENNKINETIC ER) 10-8 MG/5ML LQCR Take 5 mLs by mouth every 12 (twelve) hours. As needed for cough  140 mL  prn  . hydrochlorothiazide 25 MG tablet Take 1 tablet (25 mg total) by mouth daily.  90 tablet  4  . mometasone (NASONEX) 50 MCG/ACT nasal spray Place 2 sprays into the nose daily.  3 Act  4  . montelukast (SINGULAIR) 10 MG tablet Take 1 tablet (10 mg total) by mouth as needed.  90 tablet    4  . omega-3 acid ethyl esters (LOVAZA) 1 G capsule Take 2 capsules (2 g total) by mouth 2 (two) times daily.  360 capsule  4  . pantoprazole (PROTONIX) 40 MG tablet Take 1 tablet (40 mg total) by mouth daily.  90 tablet  4  . potassium chloride SA (K-DUR,KLOR-CON) 20 MEQ tablet Take 1 tablet (20 mEq total) by mouth daily.  90 tablet  4  . quinapril (ACCUPRIL) 10 MG tablet Take 1 tablet (10 mg total) by mouth daily.  90 tablet  4  . zolpidem (AMBIEN) 10 MG tablet Take 10 mg by mouth at bedtime as needed. For sleep       . rosuvastatin (CRESTOR) 10 MG tablet Take 1 tablet (10 mg total) by  mouth at bedtime.  90 tablet  4    Allergies  Allergen Reactions  . Kenalog   . Moxifloxacin     REACTION: pt had severe red rash- he thought from Avelox Rx....    Current Medications, Allergies, Past Medical History, Past Surgical History, Family History, and Social History were reviewed in [employer] record.   Review of Systems         See HPI - all other systems neg except as noted...  The patient denies anorexia, fever, weight loss, weight gain, vision loss, decreased hearing, hoarseness, chest pain, syncope, dyspnea on exertion, peripheral edema, prolonged cough, headaches, hemoptysis, abdominal pain, melena, hematochezia, severe indigestion/heartburn, hematuria, incontinence, muscle weakness, suspicious skin lesions, transient blindness, difficulty walking, depression, unusual weight change, abnormal bleeding, enlarged lymph nodes, and angioedema.     Objective:   Physical Exam     WD, WN, 65 y/o WM in NAD... Vital Signs:  Reviewed... GENERAL:  Alert & oriented; pleasant & cooperative... HEENT:  [place]/AT, EOM-wnl, PERRLA, EACs-clear, TMs-wnl, NOSE-clear, THROAT-clear & wnl. NECK:  Supple w/ fairROM; no JVD; normal carotid impulses w/o bruits; no thyromegaly or nodules palpated; no lymphadenopathy. CHEST:  Clear to P & A; without wheezes/ rales/ or rhonchi. HEART:  Regular Rhythm; without murmurs/ rubs/ or gallops. ABDOMEN:  Soft & nontender; right peri-umbil scar of melanoma surg; normal bowel sounds; no organomegaly or masses palpated. (RECTAL:  Neg - prostate 2+ & nontender w/o nodules; stool hematest neg; groin lymphoceles) EXT:  mild OA fingers- sl decr ROM hips/ knees; no varicose veins/ +venous insuffic/ no edema; pulses intact & WNL. NEURO:  CN's intact; motor testing normal; sensory testing normal; gait normal & balance OK. DERM:  No lesions noted; no rash etc...   Assessment & Plan:   HBP>  BP controlled on meds, continue same...  CARDIAC  EVAL by Delton See as outlined above... Pt is reassured...  HYPERCHOLESTEROLEMIA>  His LDL off Lipitor was 152 & we discussed trying low dose Crestor for tolerability & efficacy...  GI>  GERD, Polyps>  Stable on Protonix & up to date on screening...  RHEUM>  OA, CPPD>  Followed by Eppie Gibson on Celebrex & Colcrys...  LBP>  Known spondylosis & s/p several epidural steroid shots; he doesn't want surg & will contact DrNudelman when needed....  Muscle Fasciculations>  He's had a thorough Neuro eval from [facility] w/ neg EMG/ NCV (no evid for upper motor neuron dis) & he is reassured...  ?Reactive Depression>  This certainly could be expected give the numerous health concerns but he is not in favor of med rx trial at this time.Marland KitchenMarland Kitchen

## 2011-04-27 NOTE — Patient Instructions (Signed)
Today we updated your med list in EPIC>    We refilled your meds per request...  We reviewed your recent labs & Cardiac, Rheum, & Neuro evals...  We decided to try CRESTOR 10mg  daily & plan a follow up FLP & liver panel in 6-8 weeks...  Call for any problems.Marland KitchenMarland Kitchen

## 2011-05-01 ENCOUNTER — Ambulatory Visit
Admission: RE | Admit: 2011-05-01 | Discharge: 2011-05-01 | Disposition: A | Discharge status: Home / Self Care | Payer: PRIVATE HEALTH INSURANCE | Source: Ambulatory Visit | Attending: Pulmonary Disease | Admitting: Pulmonary Disease

## 2011-05-01 ENCOUNTER — Other Ambulatory Visit: Payer: Self-pay | Admitting: Pulmonary Disease

## 2011-05-01 ENCOUNTER — Telehealth: Payer: Self-pay | Admitting: Pulmonary Disease

## 2011-05-01 DIAGNOSIS — Z0000 Encounter for general adult medical examination without abnormal findings: Secondary | ICD-10-CM

## 2011-05-01 NOTE — Telephone Encounter (Signed)
Order has been signed and faxed per SN.  Called and spoke with pt and he is aware.

## 2011-05-12 ENCOUNTER — Other Ambulatory Visit: Payer: Self-pay | Admitting: Orthopaedic Surgery

## 2011-05-12 DIAGNOSIS — M25511 Pain in right shoulder: Secondary | ICD-10-CM

## 2011-05-13 ENCOUNTER — Ambulatory Visit
Admission: RE | Admit: 2011-05-13 | Discharge: 2011-05-13 | Disposition: A | Discharge status: Home / Self Care | Payer: PRIVATE HEALTH INSURANCE | Source: Ambulatory Visit | Attending: Orthopaedic Surgery | Admitting: Orthopaedic Surgery

## 2011-05-13 DIAGNOSIS — M25511 Pain in right shoulder: Secondary | ICD-10-CM

## 2011-07-08 ENCOUNTER — Other Ambulatory Visit: Payer: Self-pay | Admitting: Orthopaedic Surgery

## 2011-07-08 DIAGNOSIS — M25559 Pain in unspecified hip: Secondary | ICD-10-CM

## 2011-07-13 ENCOUNTER — Ambulatory Visit
Admission: RE | Admit: 2011-07-13 | Discharge: 2011-07-13 | Disposition: A | Discharge status: Home / Self Care | Payer: PRIVATE HEALTH INSURANCE | Source: Ambulatory Visit | Attending: Orthopaedic Surgery | Admitting: Orthopaedic Surgery

## 2011-07-13 DIAGNOSIS — M25559 Pain in unspecified hip: Secondary | ICD-10-CM

## 2011-07-13 MED ORDER — IOHEXOL 180 MG/ML  SOLN
1.0000 mL | Freq: Once | INTRAMUSCULAR | Status: AC | PRN
  Administered 2011-07-13: 1 mL via EPIDURAL

## 2011-07-13 MED ORDER — METHYLPREDNISOLONE ACETATE 40 MG/ML INJ SUSP (RADIOLOG
120.0000 mg | Freq: Once | INTRAMUSCULAR | Status: AC
  Administered 2011-07-13: 120 mg via EPIDURAL

## 2011-07-16 ENCOUNTER — Other Ambulatory Visit: Payer: Self-pay | Admitting: Pulmonary Disease

## 2011-07-16 DIAGNOSIS — M1990 Unspecified osteoarthritis, unspecified site: Secondary | ICD-10-CM

## 2011-07-17 ENCOUNTER — Other Ambulatory Visit: Payer: PRIVATE HEALTH INSURANCE

## 2011-07-17 ENCOUNTER — Ambulatory Visit
Admission: RE | Admit: 2011-07-17 | Discharge: 2011-07-17 | Disposition: A | Discharge status: Home / Self Care | Payer: PRIVATE HEALTH INSURANCE | Source: Ambulatory Visit | Attending: Pulmonary Disease | Admitting: Pulmonary Disease

## 2011-07-17 DIAGNOSIS — M1990 Unspecified osteoarthritis, unspecified site: Secondary | ICD-10-CM

## 2011-07-18 LAB — VITAMIN D 25 HYDROXY (VIT D DEFICIENCY, FRACTURES): Vit D, 25-Hydroxy: 54 ng/mL (ref 30–89)

## 2011-08-10 ENCOUNTER — Encounter (HOSPITAL_BASED_OUTPATIENT_CLINIC_OR_DEPARTMENT_OTHER)
Admission: RE | Admit: 2011-08-10 | Discharge: 2011-08-10 | Disposition: A | Discharge status: Home / Self Care | Payer: PRIVATE HEALTH INSURANCE | Source: Ambulatory Visit | Attending: Orthopaedic Surgery | Admitting: Orthopaedic Surgery

## 2011-08-10 LAB — BASIC METABOLIC PANEL
BUN: 20 mg/dL (ref 6–23)
CO2: 25 mEq/L (ref 19–32)
Calcium: 10 mg/dL (ref 8.4–10.5)
Chloride: 100 mEq/L (ref 96–112)
Creatinine, Ser: 1.11 mg/dL (ref 0.50–1.35)
GFR calc Af Amer: 80 mL/min — ABNORMAL LOW (ref >90)
GFR calc non Af Amer: 69 mL/min — ABNORMAL LOW (ref >90)
Glucose, Bld: 117 mg/dL — ABNORMAL HIGH (ref 70–99)
Potassium: 4.2 mEq/L (ref 3.5–5.1)
Sodium: 137 mEq/L (ref 135–145)

## 2011-08-11 ENCOUNTER — Telehealth: Payer: Self-pay | Admitting: Cardiology

## 2011-08-11 NOTE — Telephone Encounter (Signed)
LOV,12 Lead faxed to [facility] @ [phone]  08/11/11/km

## 2011-08-13 ENCOUNTER — Ambulatory Visit (HOSPITAL_BASED_OUTPATIENT_CLINIC_OR_DEPARTMENT_OTHER)
Admission: RE | Admit: 2011-08-13 | Discharge: 2011-08-13 | Disposition: A | Discharge status: Home / Self Care | Payer: PRIVATE HEALTH INSURANCE | Source: Ambulatory Visit | Attending: Orthopaedic Surgery | Admitting: Orthopaedic Surgery

## 2011-08-13 DIAGNOSIS — M67919 Unspecified disorder of synovium and tendon, unspecified shoulder: Secondary | ICD-10-CM | POA: Insufficient documentation

## 2011-08-13 DIAGNOSIS — Z5333 Arthroscopic surgical procedure converted to open procedure: Secondary | ICD-10-CM | POA: Insufficient documentation

## 2011-08-13 DIAGNOSIS — M719 Bursopathy, unspecified: Secondary | ICD-10-CM | POA: Insufficient documentation

## 2011-08-13 DIAGNOSIS — I10 Essential (primary) hypertension: Secondary | ICD-10-CM | POA: Insufficient documentation

## 2011-08-13 DIAGNOSIS — M19019 Primary osteoarthritis, unspecified shoulder: Secondary | ICD-10-CM | POA: Insufficient documentation

## 2011-08-13 DIAGNOSIS — M25819 Other specified joint disorders, unspecified shoulder: Secondary | ICD-10-CM | POA: Insufficient documentation

## 2011-08-13 DIAGNOSIS — Z01812 Encounter for preprocedural laboratory examination: Secondary | ICD-10-CM | POA: Insufficient documentation

## 2011-08-13 LAB — POCT HEMOGLOBIN-HEMACUE: Hemoglobin: 16.8 g/dL (ref 13.0–17.0)

## 2011-08-24 ENCOUNTER — Ambulatory Visit: Payer: PRIVATE HEALTH INSURANCE | Admitting: Rehabilitative and Restorative Service Providers"

## 2011-08-24 NOTE — Op Note (Signed)
NAME:  Peter [name], Peter [name] NO.:  [id number]  MEDICAL RECORD NO.:  [id number]  LOCATION:                                 FACILITY:  PHYSICIAN:  Claude Manges. Cleophas Dunker, M.D.DATE OF BIRTH:  04-02-1946  DATE OF PROCEDURE:  08/13/2011 DATE OF DISCHARGE:                              OPERATIVE REPORT   PREOPERATIVE DIAGNOSES: 1. Rotator cuff tear, left shoulder with impingement. 2. Degenerative joint disease acromioclavicular joint.  POSTOPERATIVE DIAGNOSES: 1. Rotator cuff tear, left shoulder with impingement. 2. Degenerative joint disease acromioclavicular joint with synovitis     and labral fraying, glenohumeral joint.  PROCEDURE: 1. Diagnostic arthroscopy, left shoulder with debridement of synovitis     and labral fraying. 2. Arthroscopic subacromial decompression. 3. Arthroscopic distal clavicle resection. 4. Mini open rotator cuff tear repair.  SURGEON:  Claude Manges. Cleophas Dunker, MD  ASSISTANT:  Oris Drone. Petrarca, PA-C  ANESTHESIA:  General with supplemental interscalene nerve block.  COMPLICATIONS:  None.  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  He has reached a point where he is having difficulty with sleeping and pain with both vocation and avocational activities.  His pain has been localized along the anterior aspect of his shoulder years ago.  He had an MRI scan consistent with impingement and a followup MRI scan revealed a rotator cuff tear of the supraspinatus associated with degenerative changes at the [facility] joint, and evidence of impingement.  He has had a course of anti-inflammatory medicines, exercises, and with continued limitation of activities and is now to have an arthroscopic evaluation.  PROCEDURE:  After Mazzie was met in the holding area, identified the left shoulder as the appropriate operative site.  He received preoperative interscalene nerve  block.  The patient was then transported to room number 6, placed under general anesthesia without difficulty and was then placed in semi-sitting position with the shoulder frame.  Evaluation of the left shoulder revealed no evidence of adhesive capsulitis or instability. Left shoulder was then prepped with DuraPrep, the base of the neck circumferentially below the elbow sterile draping was performed. Marking pen was used to outline the [facility] joint, the coracoid, and the acromion.  At a point a fingerbreadth posterior and medial to the posterior angle acromion a small stab wound was made with 11 blade knife.  The arthroscope was then easily placed into the shoulder joint.  Diagnostic arthroscopy revealed some fraying of the superior and anterior labrum.  There was areas of chondrocalcinosis the crystals within the labrum.  There appeared to be a Cobb and I did not see if partial thickness rotator cuff tear.  The biceps tendon was intact. There were minimal chondromalacia changes of the humeral head, and the glenoid.  I did see a Buford complex in the superior labrum.  There was areas of synovitis which were debrided.  The biceps tendon base appeared to be intact.  Further synovitis was debrided with the ArthroCare wand.  The arthroscope was then placed in subacromial space posteriorly the cannula subacromial space anteriorly and a third portal established lateral subacromial space.  Arthroscopic subacromial decompression was performed.  There was considerable inflammatory bursal tissue throughout the subacromial space and synovitis at the [facility] joint with prominence of the distal clavicle.  An anterior-inferior acromioplasty was performed with a 6 mm bur.  AC joint was resected with the same 6 mm bur with a very nice flat resection.  Bleeding was controlled with the ArthroCare wand.  There was obvious substantial tearing on the bursal surface of the cuff appeared accordingly and open  exploration of the cuff was performed. About an inch incision was made over the anterior aspect of the shoulder via sharp dissection, down to subcutaneous tissue.  Bleeding was controlled with the Bovie.  The raphe within the deltoid fascia was identified and incised by blunt dissection.  The fibers were then separated and the tongue retractor was inserted.  There was still some bursal tissue remaining in the anterior gutter which was debrided at that point, I could see extensive bursal surface tearing of the supraspinatus.  It was ragged extended about an inch from anterior to posterior.  Fibers were debrided back to bleeding tissue and then repaired in several layers with 0 Ethibond and 0 Vicryl.  I had a very nice repair.  There was no evidence of impingement, nice flat , smooth acromial decompression.  The wound was then irrigated with saline solution.  The deltoid fascia was closed with a running 0 Vicryl subcu with 3-0 Monocryl skin closed with Steri-Strips over benzoin, sterile bulky dressing was applied followed by a sling.  Plan oxycodone for pain.  Zofran for potential nausea, he did receive a g of Tylenol IV. Plan to see him in the office within 5-7 days.     Claude Manges. Cleophas Dunker, [name]     PWW/MEDQ  D:  08/13/2011  T:  08/14/2011  Job:  [id number]  Electronically Signed by Norlene Campbell [name] on 08/24/2011 04:54:09 AM

## 2011-08-26 ENCOUNTER — Encounter: Payer: Self-pay | Admitting: *Deleted

## 2011-08-27 ENCOUNTER — Encounter: Payer: PRIVATE HEALTH INSURANCE | Admitting: Rehabilitation

## 2011-08-31 ENCOUNTER — Encounter (HOSPITAL_COMMUNITY): Payer: Self-pay

## 2011-08-31 ENCOUNTER — Other Ambulatory Visit: Payer: Self-pay | Admitting: Orthopedic Surgery

## 2011-08-31 MED ORDER — SODIUM CHLORIDE 0.9 % IV SOLN: INTRAVENOUS | Status: DC

## 2011-08-31 MED ORDER — CHLORHEXIDINE GLUCONATE 4 % EX LIQD
60.0000 mL | Freq: Once | CUTANEOUS | Status: DC
  Filled 2011-08-31: qty 118

## 2011-09-01 ENCOUNTER — Ambulatory Visit (HOSPITAL_COMMUNITY)
Admission: RE | Admit: 2011-09-01 | Discharge: 2011-09-01 | Disposition: A | Discharge status: Home / Self Care | Payer: PRIVATE HEALTH INSURANCE | Source: Ambulatory Visit | Attending: Orthopaedic Surgery | Admitting: Orthopaedic Surgery

## 2011-09-01 ENCOUNTER — Ambulatory Visit (HOSPITAL_COMMUNITY): Payer: PRIVATE HEALTH INSURANCE | Admitting: Anesthesiology

## 2011-09-01 ENCOUNTER — Encounter (HOSPITAL_COMMUNITY): Payer: Self-pay | Admitting: Anesthesiology

## 2011-09-01 ENCOUNTER — Encounter (HOSPITAL_COMMUNITY)
Admission: RE | Disposition: A | Discharge status: Home / Self Care | Payer: Self-pay | Source: Ambulatory Visit | Attending: Orthopaedic Surgery

## 2011-09-01 ENCOUNTER — Encounter (HOSPITAL_COMMUNITY): Payer: Self-pay | Admitting: *Deleted

## 2011-09-01 DIAGNOSIS — I10 Essential (primary) hypertension: Secondary | ICD-10-CM | POA: Insufficient documentation

## 2011-09-01 DIAGNOSIS — T8489XA Other specified complication of internal orthopedic prosthetic devices, implants and grafts, initial encounter: Secondary | ICD-10-CM | POA: Insufficient documentation

## 2011-09-01 DIAGNOSIS — Z01812 Encounter for preprocedural laboratory examination: Secondary | ICD-10-CM | POA: Insufficient documentation

## 2011-09-01 DIAGNOSIS — Y834 Other reconstructive surgery as the cause of abnormal reaction of the patient, or of later complication, without mention of misadventure at the time of the procedure: Secondary | ICD-10-CM | POA: Insufficient documentation

## 2011-09-01 DIAGNOSIS — K219 Gastro-esophageal reflux disease without esophagitis: Secondary | ICD-10-CM | POA: Insufficient documentation

## 2011-09-01 HISTORY — DX: Malignant (primary) neoplasm, unspecified: C80.1

## 2011-09-01 HISTORY — DX: Other complications of anesthesia, initial encounter: T88.59XA

## 2011-09-01 HISTORY — DX: Adverse effect of unspecified anesthetic, initial encounter: T41.45XA

## 2011-09-01 LAB — COMPREHENSIVE METABOLIC PANEL
ALT: 58 U/L — ABNORMAL HIGH (ref 0–53)
AST: 35 U/L (ref 0–37)
Albumin: 3.3 g/dL — ABNORMAL LOW (ref 3.5–5.2)
Alkaline Phosphatase: 99 U/L (ref 39–117)
BUN: 22 mg/dL (ref 6–23)
CO2: 29 mEq/L (ref 19–32)
Calcium: 10.2 mg/dL (ref 8.4–10.5)
Chloride: 105 mEq/L (ref 96–112)
Creatinine, Ser: 0.86 mg/dL (ref 0.50–1.35)
GFR calc Af Amer: >90 mL/min (ref >90)
GFR calc non Af Amer: >90 mL/min (ref >90)
Glucose, Bld: 103 mg/dL — ABNORMAL HIGH (ref 70–99)
Potassium: 5.5 mEq/L — ABNORMAL HIGH (ref 3.5–5.1)
Sodium: 143 mEq/L (ref 135–145)
Total Bilirubin: 0.3 mg/dL (ref 0.3–1.2)
Total Protein: 6.8 g/dL (ref 6.0–8.3)

## 2011-09-01 LAB — CBC
HCT: 40.7 % (ref 39.0–52.0)
Hemoglobin: 14.1 g/dL (ref 13.0–17.0)
MCH: 31.3 pg (ref 26.0–34.0)
MCHC: 34.6 g/dL (ref 30.0–36.0)
MCV: 90.2 fL (ref 78.0–100.0)
Platelets: 481 10*3/uL — ABNORMAL HIGH (ref 150–400)
RBC: 4.51 MIL/uL (ref 4.22–5.81)
RDW: 11.8 % (ref 11.5–15.5)
WBC: 10.6 10*3/uL — ABNORMAL HIGH (ref 4.0–10.5)

## 2011-09-01 LAB — SURGICAL PCR SCREEN
MRSA, PCR: NEGATIVE
Staphylococcus aureus: NEGATIVE

## 2011-09-01 SURGERY — IRRIGATION AND DEBRIDEMENT SHOULDER
Anesthesia: General | Site: Shoulder | Laterality: Left | Wound class: Contaminated

## 2011-09-01 MED ORDER — SUFENTANIL CITRATE 50 MCG/ML IV SOLN
INTRAVENOUS | Status: DC | PRN
  Administered 2011-09-01: 20 ug via INTRAVENOUS
  Administered 2011-09-01 (×2): 5 ug via INTRAVENOUS
  Administered 2011-09-01: 10 ug via INTRAVENOUS

## 2011-09-01 MED ORDER — HYDROMORPHONE HCL PF 1 MG/ML IJ SOLN
0.2500 mg | INTRAMUSCULAR | Status: DC | PRN
  Administered 2011-09-01 (×3): 0.5 mg via INTRAVENOUS

## 2011-09-01 MED ORDER — KETOROLAC TROMETHAMINE 15 MG/ML IJ SOLN
INTRAMUSCULAR | Status: DC | PRN
  Administered 2011-09-01: 15 mg via INTRAVENOUS

## 2011-09-01 MED ORDER — LACTATED RINGERS IV SOLN
INTRAVENOUS | Status: DC
  Administered 2011-09-01: 14:00:00 via INTRAVENOUS

## 2011-09-01 MED ORDER — DIPHENHYDRAMINE HCL 50 MG/ML IJ SOLN
INTRAMUSCULAR | Status: DC | PRN
  Administered 2011-09-01: 50 mg via INTRAVENOUS

## 2011-09-01 MED ORDER — ONDANSETRON HCL 4 MG/2ML IJ SOLN
INTRAMUSCULAR | Status: DC | PRN
  Administered 2011-09-01 (×2): 4 mg via INTRAVENOUS

## 2011-09-01 MED ORDER — ROCURONIUM BROMIDE 100 MG/10ML IV SOLN
INTRAVENOUS | Status: DC | PRN
  Administered 2011-09-01: 50 mg via INTRAVENOUS

## 2011-09-01 MED ORDER — NEOSTIGMINE METHYLSULFATE 1 MG/ML IJ SOLN
INTRAMUSCULAR | Status: DC | PRN
  Administered 2011-09-01: 5 mg via INTRAVENOUS

## 2011-09-01 MED ORDER — MUPIROCIN 2 % EX OINT
TOPICAL_OINTMENT | Freq: Two times a day (BID) | CUTANEOUS | Status: DC
  Administered 2011-09-01: 13:00:00 via NASAL
  Filled 2011-09-01: qty 22

## 2011-09-01 MED ORDER — ACETAMINOPHEN 10 MG/ML IV SOLN
INTRAVENOUS | Status: DC | PRN
  Administered 2011-09-01: 1000 mg via INTRAVENOUS

## 2011-09-01 MED ORDER — VANCOMYCIN HCL 1000 MG IV SOLR
1000.0000 mg | INTRAVENOUS | Status: DC | PRN
  Administered 2011-09-01: 1 g via INTRAVENOUS

## 2011-09-01 MED ORDER — DROPERIDOL 2.5 MG/ML IJ SOLN
INTRAMUSCULAR | Status: DC | PRN
  Administered 2011-09-01: .625 mg via INTRAVENOUS

## 2011-09-01 MED ORDER — LACTATED RINGERS IV SOLN
INTRAVENOUS | Status: DC | PRN
  Administered 2011-09-01 (×2): via INTRAVENOUS

## 2011-09-01 MED ORDER — PHENYLEPHRINE HCL 10 MG/ML IJ SOLN
INTRAMUSCULAR | Status: DC | PRN
  Administered 2011-09-01: 40 ug via INTRAVENOUS
  Administered 2011-09-01 (×2): 80 ug via INTRAVENOUS

## 2011-09-01 MED ORDER — MEPERIDINE HCL 25 MG/ML IJ SOLN
12.5000 mg | Freq: Once | INTRAMUSCULAR | Status: AC
  Administered 2011-09-01: 12.5 mg via INTRAVENOUS

## 2011-09-01 MED ORDER — PROPOFOL 10 MG/ML IV EMUL
INTRAVENOUS | Status: DC | PRN
  Administered 2011-09-01: 200 mg via INTRAVENOUS

## 2011-09-01 MED ORDER — ONDANSETRON HCL 4 MG/2ML IJ SOLN: 4.0000 mg | Freq: Once | INTRAMUSCULAR | Status: DC | PRN

## 2011-09-01 MED ORDER — MIDAZOLAM HCL 5 MG/5ML IJ SOLN
INTRAMUSCULAR | Status: DC | PRN
  Administered 2011-09-01: 2 mg via INTRAVENOUS

## 2011-09-01 MED ORDER — HYDROMORPHONE HCL 2 MG PO TABS: 2.0000 mg | ORAL_TABLET | ORAL | Status: DC | PRN

## 2011-09-01 MED ORDER — GLYCOPYRROLATE 0.2 MG/ML IJ SOLN
INTRAMUSCULAR | Status: DC | PRN
  Administered 2011-09-01: .8 mg via INTRAVENOUS

## 2011-09-01 MED ORDER — SODIUM CHLORIDE 0.9 % IR SOLN
Status: DC | PRN
  Administered 2011-09-01 (×2): 1000 mL

## 2011-09-01 SURGICAL SUPPLY — 54 items
BANDAGE GAUZE ELAST BULKY 4 IN (GAUZE/BANDAGES/DRESSINGS) ×1 IMPLANT
BOOTCOVER CLEANROOM LRG (PROTECTIVE WEAR) ×2 IMPLANT
CLOTH BEACON ORANGE TIMEOUT ST (SAFETY) ×2 IMPLANT
COVER SURGICAL LIGHT HANDLE (MISCELLANEOUS) ×2 IMPLANT
DRAIN PENROSE 1/4X12 LTX STRL (WOUND CARE) ×1 IMPLANT
DRAPE PROXIMA HALF (DRAPES) ×1 IMPLANT
DRAPE U-SHAPE 47X51 STRL (DRAPES) ×2 IMPLANT
DRSG MEPILEX BORDER 4X8 (GAUZE/BANDAGES/DRESSINGS) IMPLANT
DRSG PAD ABDOMINAL 8X10 ST (GAUZE/BANDAGES/DRESSINGS) ×3 IMPLANT
DURAPREP 26ML APPLICATOR (WOUND CARE) ×2 IMPLANT
ELECT REM PT RETURN 9FT ADLT (ELECTROSURGICAL)
ELECTRODE REM PT RTRN 9FT ADLT (ELECTROSURGICAL) IMPLANT
EVACUATOR 1/8 PVC DRAIN (DRAIN) IMPLANT
GAUZE SPONGE 4X4 12PLY STRL LF (GAUZE/BANDAGES/DRESSINGS) ×1 IMPLANT
GAUZE XEROFORM 1X8 LF (GAUZE/BANDAGES/DRESSINGS) ×2 IMPLANT
GLOVE BIO SURGEON STRL SZ7.5 (GLOVE) ×1 IMPLANT
GLOVE BIOGEL PI IND STRL 8 (GLOVE) ×1 IMPLANT
GLOVE BIOGEL PI IND STRL 8.5 (GLOVE) ×1 IMPLANT
GLOVE BIOGEL PI INDICATOR 8 (GLOVE) ×1
GLOVE BIOGEL PI INDICATOR 8.5 (GLOVE) ×2
GLOVE ECLIPSE 7.5 STRL STRAW (GLOVE) ×1 IMPLANT
GLOVE ECLIPSE 8.0 STRL XLNG CF (GLOVE) ×4 IMPLANT
GLOVE SURG ORTHO 8.5 STRL (GLOVE) ×2 IMPLANT
GOWN STRL NON-REIN LRG LVL3 (GOWN DISPOSABLE) ×2 IMPLANT
HANDPIECE INTERPULSE COAX TIP (DISPOSABLE)
KIT BASIN OR (CUSTOM PROCEDURE TRAY) ×2 IMPLANT
KIT ROOM TURNOVER OR (KITS) ×2 IMPLANT
MANIFOLD NEPTUNE II (INSTRUMENTS) ×2 IMPLANT
NDL 1/2 CIR CATGUT .05X1.09 (NEEDLE) IMPLANT
NEEDLE 1/2 CIR CATGUT .05X1.09 (NEEDLE) ×2 IMPLANT
NS IRRIG 1000ML POUR BTL (IV SOLUTION) ×3 IMPLANT
PACK SHOULDER (CUSTOM PROCEDURE TRAY) ×2 IMPLANT
PAD ARMBOARD 7.5X6 YLW CONV (MISCELLANEOUS) ×2 IMPLANT
SET HNDPC FAN SPRY TIP SCT (DISPOSABLE) IMPLANT
SLING ARM IMMOBILIZER LRG (SOFTGOODS) ×1 IMPLANT
SPONGE GAUZE 4X4 12PLY (GAUZE/BANDAGES/DRESSINGS) ×2 IMPLANT
SPONGE LAP 18X18 X RAY DECT (DISPOSABLE) ×3 IMPLANT
SUPPORT WRAP ARM LG (MISCELLANEOUS) IMPLANT
SUT ETHILON 3 0 PS 1 (SUTURE) ×1 IMPLANT
SUT STEEL 1 (SUTURE) ×2 IMPLANT
SUT STEEL 5 (SUTURE) ×1 IMPLANT
SUT VIC AB 0 CT1 27 (SUTURE)
SUT VIC AB 0 CT1 27XBRD ANBCTR (SUTURE) IMPLANT
SUT VIC AB 2-0 CT1 27 (SUTURE)
SUT VIC AB 2-0 CT1 TAPERPNT 27 (SUTURE) IMPLANT
SWAB COLLECTION DEVICE MRSA (MISCELLANEOUS) ×2 IMPLANT
TAPE HYPAFIX 6X30 (GAUZE/BANDAGES/DRESSINGS) ×1 IMPLANT
TOWEL OR 17X24 6PK STRL BLUE (TOWEL DISPOSABLE) ×2 IMPLANT
TOWEL OR 17X26 10 PK STRL BLUE (TOWEL DISPOSABLE) ×2 IMPLANT
TUBE ANAEROBIC SPECIMEN COL (MISCELLANEOUS) ×1 IMPLANT
TUBE CONNECTING 12X1/4 (SUCTIONS) ×2 IMPLANT
UNDERPAD 30X30 INCONTINENT (UNDERPADS AND DIAPERS) ×1 IMPLANT
WATER STERILE IRR 1000ML POUR (IV SOLUTION) ×1 IMPLANT
YANKAUER SUCT BULB TIP NO VENT (SUCTIONS) ×2 IMPLANT

## 2011-09-01 NOTE — Transfer of Care (Signed)
Immediate Anesthesia Transfer of Care Note  Patient: Peter [name]  Procedure(s) Performed:  IRRIGATION AND DEBRIDEMENT SHOULDER - foreign body reaction to suture, left shoulder, status post rotator cuff repair  Patient Location: PACU  Anesthesia Type: General  Level of Consciousness: awake, oriented and patient cooperative  Airway & Oxygen Therapy: Patient Spontanous Breathing and Patient connected to face mask oxygen  Post-op Assessment: Report given to PACU RN  Post vital signs: Reviewed and stable  Complications: No apparent anesthesia complications

## 2011-09-01 NOTE — Progress Notes (Signed)
Assisted pt to bathroom to void.  Pt's wife brought to bedside to assist pt with dressing and going home.

## 2011-09-01 NOTE — Anesthesia Procedure Notes (Signed)
Procedure Name: Intubation Date/Time: 09/01/2011 3:15 PM Performed by: Glendora Score Pre-anesthesia Checklist: Patient identified, Emergency Drugs available, Suction available and Patient being monitored Patient Re-evaluated:Patient Re-evaluated prior to inductionOxygen Delivery Method: Circle System Utilized Preoxygenation: Pre-oxygenation with 100% oxygen Intubation Type: IV induction Ventilation: Mask ventilation without difficulty Laryngoscope Size: Miller and 2 Grade View: Grade I Tube type: Oral Tube size: 7.5 mm Number of attempts: 1 Airway Equipment and Method: stylet Placement Confirmation: ETT inserted through vocal cords under direct vision,  positive ETCO2 and breath sounds checked- equal and bilateral Secured at: 23 cm Tube secured with: Tape Dental Injury: Teeth and Oropharynx as per pre-operative assessment

## 2011-09-01 NOTE — Interval H&P Note (Signed)
History and Physical Interval Note:   09/01/2011   2:59 PM   Peter [name]  has presented today for surgery, with the diagnosis of left shoulder pain  The various methods of treatment have been discussed with the patient and family. After consideration of risks, benefits and other options for treatment, the patient has consented to  Procedure(s): IRRIGATION AND DEBRIDEMENT SHOULDER as a surgical intervention .  The patients' history has been reviewed, patient examined, no change in status, stable for surgery.  I have reviewed the patients' chart and labs.  Questions were answered to the patient's satisfaction.     Valeria Batman  MD

## 2011-09-01 NOTE — Anesthesia Preprocedure Evaluation (Signed)
Anesthesia Evaluation  Patient identified by MRN, date of birth, ID band Patient awake    Reviewed: Allergy & Precautions, H&P , NPO status   History of Anesthesia Complications (+) Family history of anesthesia reaction  Airway Mallampati: I TM Distance: >3 FB Neck ROM: Full    Dental  (+) Teeth Intact   Pulmonary    Pulmonary exam normal       Cardiovascular hypertension, Regular Normal    Neuro/Psych    GI/Hepatic GERD-  ,  Endo/Other    Renal/GU      Musculoskeletal   Abdominal Normal abdominal exam  (+)   Peds  Hematology   Anesthesia Other Findings   Reproductive/Obstetrics                           Anesthesia Physical Anesthesia Plan  ASA: I  Anesthesia Plan: General   Post-op Pain Management:    Induction: Intravenous  Airway Management Planned: Oral ETT  Additional Equipment:   Intra-op Plan:   Post-operative Plan: Extubation in OR  Informed Consent: I have reviewed the patients History and Physical, chart, labs and discussed the procedure including the risks, benefits and alternatives for the proposed anesthesia with the patient or authorized representative who has indicated his/her understanding and acceptance.     Plan Discussed with: CRNA, Anesthesiologist and Surgeon  Anesthesia Plan Comments:         Anesthesia Quick Evaluation

## 2011-09-01 NOTE — Anesthesia Postprocedure Evaluation (Signed)
  Anesthesia Post-op Note  Patient: Peter [name]  Procedure(s) Performed:  IRRIGATION AND DEBRIDEMENT SHOULDER - foreign body reaction to suture, left shoulder, status post rotator cuff repair  Patient Location: PACU  Anesthesia Type: General  Level of Consciousness: awake, alert  and oriented  Airway and Oxygen Therapy: Patient connected to nasal cannula oxygen  Post-op Pain: moderate  Post-op Assessment: Post-op Vital signs reviewed, Patient's Cardiovascular Status Stable, Respiratory Function Stable, Patent Airway, No signs of Nausea or vomiting and Pain level controlled  Post-op Vital Signs: stable  Complications: No apparent anesthesia complications

## 2011-09-01 NOTE — Op Note (Signed)
PATIENT ID:      Peter [name]  MRN:     [id number] DOB/AGE:    [birth year] / 65 y.o.       OPERATIVE REPORT    DATE OF PROCEDURE:  09/01/2011       PREOPERATIVE DIAGNOSIS: Left shoulder foreign body suture reaction with draining sinus S/P rotator cuff tear repair                                                      Body mass index is 26.88 kg/(m^2).     POSTOPERATIVE DIAGNOSIS:  same                                                         Body mass index is 26.88 kg/(m^2).     PROCEDURE:  Left shoulder I&D with removal of ethibond suture, re repair of rotator cuff tear  and closure over drain      SURGEON:  Norlene Campbell, MD     ASSISTANT: Oris Drone. Petrarca, PA-C  (present throughout entire procedure and necessary for timely completion of the procedure)           ANESTHESIA:  General      COMPLICATIONS:  None          PROCEDURE IN DETAIL: Patient was met in the holding area. I identified the left shoulder as the appropriate operative site. An intrascalene nerve block was not performed. The patient was then transported to room #1 and placed under general orotracheal anesthesia without difficulty. Patient was then placed in a semisitting position with a shoulder frame. Left shoulder was prepped with Betadine scrub and then chlorhexidine.  Prior incision including the small draining sinus was opened with a 15 blade knife. Abundant reactive mucous-type tissue was debrided with a rongeur. Several Ethibond sutures were identified and removed from the wound. Culture of the mucus material was performed. Abundant irrigation of the wound was performed with sterile saline.The rotator cuff tear was identified and repaired with interrupted #25-gauge stainless steel wire. Excellent apposition of the edges with good bleeding tissue. Wound was again irrigated with saline solution. The deltoid fascia was closed with the same 25-gauge wire. Skin was closed with interrupted 3-0 nylon over a Penrose drain. A  sterile bulky dressing was applied followed by the patient's sling. Patient was then awoken and returned to the post anesthesia recovery in satisfactory condition. He did receive a gram of vancomycin preoperatively in addition to IV Tylenol and Toradol.  CONDITION:  stable   Norlene Campbell, MD 09/01/2011, 4:33 PM

## 2011-09-01 NOTE — Preoperative (Signed)
Beta Blockers   Reason not to administer Beta Blockers:Not Applicable 

## 2011-09-01 NOTE — H&P (Signed)
Peter [name] is an 65 y.o. male.   Chief Complaint: painful left shoulder HPI: Recent scope with cuff repair but has developed drainage from the wound area which is negative on culture .  Past Medical History  Diagnosis Date  . Allergic rhinitis   . Tinnitus     chronic  . Hypertension   . History of rheumatic fever   . Hypercholesteremia     borderline  . GERD (gastroesophageal reflux disease)   . History of colonic polyps   . DJD (degenerative joint disease)   . History of pseudogout   . Spondylosis   . Family history of Alzheimer's disease   . History of melanoma   . Chest pain     Nuclear 2005, normal / coronary CTA 2008, slight mixed plaque, coronary calcium score 0.65, ejection fraction 55%, echo, March, 2008  . Ejection fraction     EF 55%, echo, March, 2008.  . Dizziness     Dizziness with question of presyncope April 08, 2011  . Complication of anesthesia     itching after 07/2011 surgery -not sure if it was anesthesia or Oxycodone  . Cancer     Melanoma     Past Surgical History  Procedure Date  . Melanoma excision with sentinel lymph node biopsy 8/02  . Cervical discectomy     anterior with patellar allograft and plating  . Anterior cervical decomp/discectomy fusion     Family History  Problem Relation Age of Onset  . Alzheimer's disease Father   . Alzheimer's disease Mother    Social History:  reports that he has never smoked. He has never used smokeless tobacco. He reports that he drinks alcohol. He reports that he does not use illicit drugs.  Allergies:  Allergies  Allergen Reactions  . Kenalog Other (See Comments)    Unknown    . Moxifloxacin Other (See Comments)    REACTION: pt had severe red rash- he thought from Avelox Rx....    Medications Prior to Admission  Medication Dose Route Frequency Provider Last Rate Last Dose  . 0.9 %  sodium chloride infusion   Intravenous Continuous Jacqualine Code, PA      . chlorhexidine (HIBICLENS) 4 %  liquid 4 application  60 mL Topical Once Jacqualine Code, PA      . lactated ringers infusion   Intravenous Continuous Josepha Pigg, MD 50 mL/hr at 09/01/11 1339    . mupirocin (BACTROBAN) 2 % ointment   Nasal BID Valeria Batman, MD       Medications Prior to Admission  Medication Sig Dispense Refill  . amLODipine (NORVASC) 10 MG tablet Take 1 tablet (10 mg total) by mouth daily.  90 tablet  4  . aspirin 81 MG tablet Take 81 mg by mouth daily.        . celecoxib (CELEBREX) 200 MG capsule Take 1 capsule (200 mg total) by mouth 2 (two) times daily.  180 capsule  4  . hydrochlorothiazide 25 MG tablet Take 1 tablet (25 mg total) by mouth daily.  90 tablet  4  . mometasone (NASONEX) 50 MCG/ACT nasal spray Place 2 sprays into the nose daily.  3 Act  4  . pantoprazole (PROTONIX) 40 MG tablet Take 1 tablet (40 mg total) by mouth daily.  90 tablet  4  . potassium chloride SA (K-DUR,KLOR-CON) 20 MEQ tablet Take 1 tablet (20 mEq total) by mouth daily.  90 tablet  4  . quinapril (ACCUPRIL) 10 MG  tablet Take 1 tablet (10 mg total) by mouth daily.  90 tablet  4  . rosuvastatin (CRESTOR) 10 MG tablet Take 1 tablet (10 mg total) by mouth at bedtime.  90 tablet  4  . zolpidem (AMBIEN) 10 MG tablet Take 10 mg by mouth at bedtime as needed. For sleep        Results for orders placed during the hospital encounter of 09/01/11 (from the past 48 hour(s))  SURGICAL PCR SCREEN     Status: Normal   Collection Time   09/01/11 12:35 PM      Component Value Range Comment   MRSA, PCR NEGATIVE  NEGATIVE     Staphylococcus aureus NEGATIVE  NEGATIVE    CBC     Status: Abnormal   Collection Time   09/01/11  1:00 PM      Component Value Range Comment   WBC 10.6 (*) 4.0 - 10.5 (K/uL)    RBC 4.51  4.22 - 5.81 (MIL/uL)    Hemoglobin 14.1  13.0 - 17.0 (g/dL)    HCT 41.3  24.4 - 01.0 (%)    MCV 90.2  78.0 - 100.0 (fL)    MCH 31.3  26.0 - 34.0 (pg)    MCHC 34.6  30.0 - 36.0 (g/dL)    RDW 27.2  53.6 - 64.4 (%)      Platelets 481 (*) 150 - 400 (K/uL)   COMPREHENSIVE METABOLIC PANEL     Status: Abnormal   Collection Time   09/01/11  1:00 PM      Component Value Range Comment   Sodium 143  135 - 145 (mEq/L)    Potassium 5.5 (*) 3.5 - 5.1 (mEq/L)    Chloride 105  96 - 112 (mEq/L)    CO2 29  19 - 32 (mEq/L)    Glucose, Bld 103 (*) 70 - 99 (mg/dL)    BUN 22  6 - 23 (mg/dL)    Creatinine, Ser 0.34  0.50 - 1.35 (mg/dL)    Calcium 74.2  8.4 - 10.5 (mg/dL)    Total Protein 6.8  6.0 - 8.3 (g/dL)    Albumin 3.3 (*) 3.5 - 5.2 (g/dL)    AST 35  0 - 37 (U/L)    ALT 58 (*) 0 - 53 (U/L)    Alkaline Phosphatase 99  39 - 117 (U/L)    Total Bilirubin 0.3  0.3 - 1.2 (mg/dL)    GFR calc non Af Amer >90  >90 (mL/min)    GFR calc Af Amer >90  >90 (mL/min)    No results found.  Review of Systems  Constitutional: Negative.   HENT: Positive for hearing loss and tinnitus.   Eyes: Negative.   Respiratory: Negative.   Cardiovascular: Negative.  Negative for palpitations.  Gastrointestinal: Positive for heartburn.  Genitourinary: Negative.   Musculoskeletal: Positive for myalgias (cppd arthropathy).  Skin: Positive for rash (reacts to many topical agents).  Neurological: Negative.   Endo/Heme/Allergies: Negative.   Psychiatric/Behavioral: Negative.     Blood pressure 122/81, pulse 75, temperature 97.8 F (36.6 C), temperature source Oral, resp. rate 18, height 5\' 9"  (1.753 m), weight 82.555 kg (182 lb), SpO2 97.00%. Physical Exam  Constitutional: He is oriented to person, place, and time. He appears well-developed and well-nourished.  HENT:  Head: Normocephalic.  Eyes: Pupils are equal, round, and reactive to light.  Neck: Neck supple.  Cardiovascular: Normal rate and regular rhythm.   Respiratory: Effort normal and breath sounds normal.  GI: Soft.  Bowel sounds are normal.  Musculoskeletal: Normal range of motion.  Neurological: He is alert and oriented to person, place, and time.  Skin: Skin is warm.      Assessment/Plan Foreign body reaction to suture with draining sinus culture negative  Plan I&D removal of foreign body with repair with wire.+  Lynee Rosenbach 09/01/2011, 2:44 PM

## 2011-09-02 ENCOUNTER — Ambulatory Visit: Payer: PRIVATE HEALTH INSURANCE | Admitting: Physical Therapy

## 2011-09-03 ENCOUNTER — Encounter: Payer: PRIVATE HEALTH INSURANCE | Admitting: Physical Therapy

## 2011-09-04 LAB — TISSUE CULTURE: Culture: NO GROWTH

## 2011-09-06 LAB — ANAEROBIC CULTURE

## 2011-09-08 ENCOUNTER — Encounter: Payer: PRIVATE HEALTH INSURANCE | Admitting: Physical Therapy

## 2011-09-09 ENCOUNTER — Encounter: Payer: PRIVATE HEALTH INSURANCE | Admitting: Physical Therapy

## 2011-09-11 ENCOUNTER — Telehealth: Payer: Self-pay | Admitting: *Deleted

## 2011-09-11 NOTE — Telephone Encounter (Signed)
Dr. Kriste Basque spoke with pt this morning about ?allergic reaction to his sutures in his shoulder.  SN called in pepcid 20mg    #60  1 po bid [facility].  Pt is aware of meds sent to the pharmacy.

## 2011-09-15 ENCOUNTER — Encounter: Payer: PRIVATE HEALTH INSURANCE | Admitting: Physical Therapy

## 2011-09-16 ENCOUNTER — Encounter: Payer: PRIVATE HEALTH INSURANCE | Admitting: Physical Therapy

## 2011-09-18 ENCOUNTER — Encounter: Payer: Self-pay | Admitting: Pulmonary Disease

## 2011-10-09 ENCOUNTER — Ambulatory Visit (INDEPENDENT_AMBULATORY_CARE_PROVIDER_SITE_OTHER): Payer: PRIVATE HEALTH INSURANCE | Admitting: Internal Medicine

## 2011-10-09 ENCOUNTER — Other Ambulatory Visit: Payer: Self-pay | Admitting: Internal Medicine

## 2011-10-09 ENCOUNTER — Encounter: Payer: Self-pay | Admitting: Internal Medicine

## 2011-10-09 ENCOUNTER — Ambulatory Visit
Admission: RE | Admit: 2011-10-09 | Discharge: 2011-10-09 | Disposition: A | Discharge status: Home / Self Care | Payer: PRIVATE HEALTH INSURANCE | Source: Ambulatory Visit | Attending: Internal Medicine | Admitting: Internal Medicine

## 2011-10-09 VITALS — BP 147/84 | HR 66 | Temp 97.5°F | Ht 69.0 in | Wt 186.0 lb

## 2011-10-09 DIAGNOSIS — M25512 Pain in left shoulder: Secondary | ICD-10-CM

## 2011-10-09 DIAGNOSIS — Z9889 Other specified postprocedural states: Secondary | ICD-10-CM

## 2011-10-09 DIAGNOSIS — M19019 Primary osteoarthritis, unspecified shoulder: Secondary | ICD-10-CM | POA: Insufficient documentation

## 2011-10-09 LAB — OTHER SOLSTAS TEST
Crystals, Fluid: NONE SEEN
Eosinophils-Synovial: 0 % (ref 0–1)
Glucose, Synovial Fluid: 62 mg/dL
Lymphocytes-Synovial Fld: 2 % (ref 0–20)
Monocyte/Macrophage: 15 % — ABNORMAL LOW (ref 50–90)
Neutrophil, Synovial: 83 % — ABNORMAL HIGH (ref 0–25)
Total protein, fluid: 5.1 g/dL
WBC, Synovial: 11100 cu mm — ABNORMAL HIGH (ref 0–200)

## 2011-10-09 LAB — CBC WITH DIFFERENTIAL/PLATELET
Basophils Absolute: 0.1 10*3/uL (ref 0.0–0.1)
Basophils Relative: 1 % (ref 0–1)
Eosinophils Absolute: 0.2 10*3/uL (ref 0.0–0.7)
Eosinophils Relative: 3 % (ref 0–5)
HCT: 45.6 % (ref 39.0–52.0)
Hemoglobin: 15.4 g/dL (ref 13.0–17.0)
Lymphocytes Relative: 19 % (ref 12–46)
Lymphs Abs: 1.7 10*3/uL (ref 0.7–4.0)
MCH: 30.6 pg (ref 26.0–34.0)
MCHC: 33.8 g/dL (ref 30.0–36.0)
MCV: 90.7 fL (ref 78.0–100.0)
Monocytes Absolute: 1.2 10*3/uL — ABNORMAL HIGH (ref 0.1–1.0)
Monocytes Relative: 13 % — ABNORMAL HIGH (ref 3–12)
Neutro Abs: 5.8 10*3/uL (ref 1.7–7.7)
Neutrophils Relative %: 65 % (ref 43–77)
Platelets: 362 10*3/uL (ref 150–400)
RBC: 5.03 MIL/uL (ref 4.22–5.81)
RDW: 12.5 % (ref 11.5–15.5)
WBC: 8.9 10*3/uL (ref 4.0–10.5)

## 2011-10-09 LAB — COMPREHENSIVE METABOLIC PANEL
ALT: 37 U/L (ref 0–53)
AST: 27 U/L (ref 0–37)
Albumin: 4.7 g/dL (ref 3.5–5.2)
Alkaline Phosphatase: 88 U/L (ref 39–117)
BUN: 19 mg/dL (ref 6–23)
CO2: 28 mEq/L (ref 19–32)
Calcium: 10.1 mg/dL (ref 8.4–10.5)
Chloride: 99 mEq/L (ref 96–112)
Creat: 0.97 mg/dL (ref 0.50–1.35)
Glucose, Bld: 92 mg/dL (ref 70–99)
Potassium: 4.6 mEq/L (ref 3.5–5.3)
Sodium: 138 mEq/L (ref 135–145)
Total Bilirubin: 0.6 mg/dL (ref 0.3–1.2)
Total Protein: 7.1 g/dL (ref 6.0–8.3)

## 2011-10-09 LAB — C-REACTIVE PROTEIN: CRP: 0.34 mg/dL (ref <0.60)

## 2011-10-09 LAB — SEDIMENTATION RATE: Sed Rate: 5 mm/hr (ref 0–16)

## 2011-10-09 MED ORDER — DOXYCYCLINE HYCLATE 100 MG PO TABS: 100.0000 mg | ORAL_TABLET | Freq: Two times a day (BID) | ORAL | Status: AC

## 2011-10-09 NOTE — Progress Notes (Signed)
Patient ID: Peter [name], male   DOB: February 02, 1946, 65 y.o.   MRN: [id number] Infectious Diseases Initial Consultation     Date of Consult:  10/09/2011  Brief Problem List:  1. Degenerative joint disease  Left rotator cuff repair 08/13/2011  Postoperative left shoulder inflammation  Left shoulder incision and drainage with removal of old the sutures and conversion to stainless steel sutures 09/01/2011  Recommendations: 1. Reaspiration of the fluid-filled lab in the left shoulder incision line and send the fluid for cell count with differential, crystal analysis, Gram stain, aerobic and anaerobic culture, AFB stain and culture, and fungal stain and culture. 2. CBC with differential, sedimentation rate and C-reactive protein 3. Doxycycline 100 mg twice a day after aspiration  Assessment: It is not clear to me what is causing his postoperative inflammation. I agree with the concern for some type of hypersensitivity reaction to suture material but would expect that it should be getting better 5 weeks after removal of synthetic sutures and conversion to stainless steel. I cannot absolutely rule out a smoldering, opportunistic infection. For obvious reasons he is very frustrated and would like to pursue further testing to try to pinpoint the cause and develop a treatment plan that will speed his healing. I've spoken to Dr. Lonia Skinner today who has agreed to reaspirate the fluid filled bleb and send fluid for further diagnostic testing. I will talk to our lab and have them hold his routine cultures for a longer period of time than usual to look for organisms such as Propionibacterium as well as the usual suspects such as coagulase-negative staph. I will also check for AFB and fungal organisms and crystals. After the aspiration I will have him start oral doxycycline to target coagulase-negative staph in Propionibacterium. I've asked him to call me after his visit to [facility] next week.    HPI: SABASTION [name] is  a 65 y.o. male who underwent left rotator cuff repair in mid October by Dr. Norlene Besse Miron. About 5 days postoperatively he noted some blisters forming along the incision line. Dr. Cleophas Dunker removed some of the superficial sutures in the office. He continued to have some fluid drainage from the sinus tract and erythema surrounding the incision site. He received Keflex for 10 days without any apparent improvement. He underwent repeat surgery with incision and drainage on November 6. Preoperative MRSA PCR screening test was negative. Dr. Cleophas Dunker noticed some mucus within the incision which was debrided. The old sutures were removed and the shoulder was repaired with deep stainless steel sutures. Nylon sutures were used superficially and were removed postoperatively. Operative Gram stain showed some white blood cells but no organisms and cultures were negative but he had received vancomycin prophylaxis preoperatively. Apparently he's also had another culture done in Dr. Hoy Register office which was also negative and he saw Dr. Karlyn Agee on November 19 who again aspirated the shoulder. That specimen showed rare white blood cells and no organisms. Cultures were again negative.  He is not having much pain in his shoulder. He has been using some topical Cloderm steroid and thinks that the surrounding erythema may be only slightly better over the last month. He has not had any fever, chills, or sweats. He is due to be seen in the dermatology clinic at [facility] on December 17 to undergo patch testing to see if he might be having an allergic reaction to any of the suture materials.       Past Medical History  Diagnosis Date  .  Allergic rhinitis   . Tinnitus     chronic  . Hypertension   . History of rheumatic fever   . Hypercholesteremia     borderline  . GERD (gastroesophageal reflux disease)   . History of colonic polyps   . DJD (degenerative joint disease)   . History of pseudogout   . Spondylosis   .  Family history of Alzheimer's disease   . History of melanoma   . Chest pain     Nuclear 2005, normal / coronary CTA 2008, slight mixed plaque, coronary calcium score 0.65, ejection fraction 55%, echo, March, 2008  . Ejection fraction     EF 55%, echo, March, 2008.  . Dizziness     Dizziness with question of presyncope April 08, 2011  . Complication of anesthesia     itching after 07/2011 surgery -not sure if it was anesthesia or Oxycodone  . Cancer     Melanoma     History  Substance Use Topics  . Smoking status: Never Smoker   . Smokeless tobacco: Never Used  . Alcohol Use: Yes     3 x a week    Family History  Problem Relation Age of Onset  . Alzheimer's disease Father   . Alzheimer's disease Mother    Allergies  Allergen Reactions  . Kenalog Other (See Comments)    Unknown    . Moxifloxacin Other (See Comments)    REACTION: pt had severe red rash- he thought from Avelox Rx....    OBJECTIVE: Blood pressure 147/84, pulse 66, temperature 97.5 F (36.4 C), temperature source Oral, height 5\' 9"  (1.753 m), weight 84.369 kg (186 lb). General: Alert and in no distress Skin: There is a large area of erythema surrounding the left shoulder incision and extending down toward his chest. The area is slightly warmer than surrounding skin. It is not painful to touch. The areas a dime-sized bleb in the central portion of his surgical incision that is he sleep compressible and appears to be fluid filled. There is no drainage. He has no generalized rash or adenopathy        Component Value Date/Time   SDES TISSUE SHOULDER LEFT 09/01/2011 1536   SDES TISSUE SHOULDER LEFT 09/01/2011 1536   SPECREQUEST NONE 09/01/2011 1536   SPECREQUEST NONE 09/01/2011 1536   CULT NO ANAEROBES ISOLATED 09/01/2011 1536   CULT NO GROWTH 3 DAYS 09/01/2011 1536   REPTSTATUS 09/06/2011 FINAL 09/01/2011 1536   REPTSTATUS 09/04/2011 FINAL 09/01/2011 1536    Cliffton Asters, MD [facility] [phone] pager   [phone] cell 10/09/2011, 12:02 PM

## 2011-10-16 LAB — BODY FLUID CULTURE
Gram Stain: NONE SEEN
Organism ID, Bacteria: NO GROWTH

## 2011-10-25 LAB — ANAEROBIC CULTURE: Gram Stain: NONE SEEN

## 2011-11-09 LAB — FUNGUS CULTURE W SMEAR: Smear Result: NONE SEEN

## 2011-11-17 ENCOUNTER — Encounter: Payer: Self-pay | Admitting: Internal Medicine

## 2011-11-19 ENCOUNTER — Encounter: Payer: Self-pay | Admitting: Internal Medicine

## 2011-11-24 LAB — AFB CULTURE WITH SMEAR (NOT AT ARMC): Acid Fast Smear: NONE SEEN

## 2011-12-23 ENCOUNTER — Other Ambulatory Visit: Payer: Self-pay | Admitting: *Deleted

## 2011-12-23 MED ORDER — MONTELUKAST SODIUM 10 MG PO TABS: 10.0000 mg | ORAL_TABLET | Freq: Every day | ORAL | Status: DC

## 2011-12-24 ENCOUNTER — Other Ambulatory Visit: Payer: Self-pay | Admitting: Rheumatology

## 2011-12-24 ENCOUNTER — Ambulatory Visit
Admission: RE | Admit: 2011-12-24 | Discharge: 2011-12-24 | Disposition: A | Discharge status: Home / Self Care | Payer: PRIVATE HEALTH INSURANCE | Source: Ambulatory Visit | Attending: Rheumatology | Admitting: Rheumatology

## 2011-12-24 DIAGNOSIS — M2550 Pain in unspecified joint: Secondary | ICD-10-CM

## 2011-12-31 ENCOUNTER — Other Ambulatory Visit: Payer: Self-pay | Admitting: Orthopaedic Surgery

## 2011-12-31 DIAGNOSIS — M25551 Pain in right hip: Secondary | ICD-10-CM

## 2012-01-05 ENCOUNTER — Other Ambulatory Visit: Payer: Self-pay | Admitting: Rheumatology

## 2012-01-05 DIAGNOSIS — M1990 Unspecified osteoarthritis, unspecified site: Secondary | ICD-10-CM

## 2012-01-06 ENCOUNTER — Ambulatory Visit
Admission: RE | Admit: 2012-01-06 | Discharge: 2012-01-06 | Disposition: A | Discharge status: Home / Self Care | Payer: PRIVATE HEALTH INSURANCE | Source: Ambulatory Visit | Attending: Orthopaedic Surgery | Admitting: Orthopaedic Surgery

## 2012-01-06 DIAGNOSIS — M25551 Pain in right hip: Secondary | ICD-10-CM

## 2012-01-06 MED ORDER — IOHEXOL 180 MG/ML  SOLN
1.0000 mL | Freq: Once | INTRAMUSCULAR | Status: AC | PRN
  Administered 2012-01-06: 1 mL via INTRA_ARTICULAR

## 2012-01-06 MED ORDER — METHYLPREDNISOLONE ACETATE 40 MG/ML INJ SUSP (RADIOLOG
100.0000 mg | Freq: Once | INTRAMUSCULAR | Status: AC
  Administered 2012-01-06: 100 mg via INTRA_ARTICULAR

## 2012-01-07 ENCOUNTER — Ambulatory Visit
Admission: RE | Admit: 2012-01-07 | Discharge: 2012-01-07 | Disposition: A | Discharge status: Home / Self Care | Payer: PRIVATE HEALTH INSURANCE | Source: Ambulatory Visit | Attending: Rheumatology | Admitting: Rheumatology

## 2012-01-07 DIAGNOSIS — M1990 Unspecified osteoarthritis, unspecified site: Secondary | ICD-10-CM

## 2012-01-07 MED ORDER — GADOBENATE DIMEGLUMINE 529 MG/ML IV SOLN
17.0000 mL | Freq: Once | INTRAVENOUS | Status: AC | PRN
  Administered 2012-01-07: 17 mL via INTRAVENOUS

## 2012-03-24 ENCOUNTER — Other Ambulatory Visit: Payer: Self-pay | Admitting: Pulmonary Disease

## 2012-04-05 ENCOUNTER — Other Ambulatory Visit: Payer: Self-pay | Admitting: Pulmonary Disease

## 2012-04-05 DIAGNOSIS — Z0000 Encounter for general adult medical examination without abnormal findings: Secondary | ICD-10-CM

## 2012-04-06 ENCOUNTER — Ambulatory Visit
Admission: RE | Admit: 2012-04-06 | Discharge: 2012-04-06 | Disposition: A | Discharge status: Home / Self Care | Payer: PRIVATE HEALTH INSURANCE | Source: Ambulatory Visit | Attending: Pulmonary Disease | Admitting: Pulmonary Disease

## 2012-04-06 DIAGNOSIS — Z0000 Encounter for general adult medical examination without abnormal findings: Secondary | ICD-10-CM

## 2012-04-07 ENCOUNTER — Other Ambulatory Visit: Payer: Self-pay | Admitting: Pulmonary Disease

## 2012-04-11 ENCOUNTER — Ambulatory Visit (INDEPENDENT_AMBULATORY_CARE_PROVIDER_SITE_OTHER): Payer: PRIVATE HEALTH INSURANCE | Admitting: Pulmonary Disease

## 2012-04-11 ENCOUNTER — Encounter: Payer: Self-pay | Admitting: Pulmonary Disease

## 2012-04-11 VITALS — BP 138/76 | HR 75 | Temp 97.0°F | Ht 70.0 in | Wt 187.0 lb

## 2012-04-11 DIAGNOSIS — I10 Essential (primary) hypertension: Secondary | ICD-10-CM

## 2012-04-11 DIAGNOSIS — Z860100 Personal history of colon polyps, unspecified: Secondary | ICD-10-CM

## 2012-04-11 DIAGNOSIS — J309 Allergic rhinitis, unspecified: Secondary | ICD-10-CM

## 2012-04-11 DIAGNOSIS — C439 Malignant melanoma of skin, unspecified: Secondary | ICD-10-CM

## 2012-04-11 DIAGNOSIS — M25559 Pain in unspecified hip: Secondary | ICD-10-CM

## 2012-04-11 DIAGNOSIS — Z0000 Encounter for general adult medical examination without abnormal findings: Secondary | ICD-10-CM

## 2012-04-11 DIAGNOSIS — K219 Gastro-esophageal reflux disease without esophagitis: Secondary | ICD-10-CM

## 2012-04-11 DIAGNOSIS — M479 Spondylosis, unspecified: Secondary | ICD-10-CM

## 2012-04-11 DIAGNOSIS — E785 Hyperlipidemia, unspecified: Secondary | ICD-10-CM

## 2012-04-11 DIAGNOSIS — M1990 Unspecified osteoarthritis, unspecified site: Secondary | ICD-10-CM

## 2012-04-11 NOTE — Progress Notes (Signed)
Subjective:    Patient ID: Peter [name], male    DOB: November 09, 1945, 66 y.o.   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  see prob list below:  ~  June 20, 2010:  Add-on today at pt request for intermittent R>L hip area pain, worse recently & Rx w/ OTC Ibuprofen w/ some help... no prev ortho problems, but mild OA apparent on exam fingers etc... exam shows good ROM w/o pain, non-tender, no skin lesions, etc... we discussed Rx w/ MOBIC, check XRays, & next step= Ortho eval for poss shot in the area.  ~  April 24, 2011:  51mo ROV & CPX> Sanay has had a number of medical complaints as outlined in his accompanying note> wife was concerned all this was having a toll & that Kamani might be depressed, we discussed this & decided to hold off on any new meds at this time...    He had dizziness episode, near syncope, & saw [facility] 6/12 for Cards eval> EKG NSR, WNL; CDopplers w/ min plaque, no signif stenoses; he did not feel that further eval was warranted...    He's had an excellent Rheum eval from [facility] w/ OA & CPPD> on Colcrys 1.5 tabs daily, plus Celebrex 200mg  Bid prn... He's had Dupytren's injection in hands from DrSypher as well and some triggering of the right 4th digit...    Solly noted muscle fasciculations & had a thorough eval from [facility] (we don't have his notes)> pt indicates that they are felt to be secondary to his lumbar spondylosis (3 levels) for which he has had several epidural steroid shots & has been checked by [employer]; EMG/ NCV were normal & no signs of upper motor neuron disease...   Objective> CXR 9/09 showed clear, WNL;  EKG= NSR, WNL;  LABS= FLP off Statin showed TChol 228 & LDL 152; Chems all wnl x LFTs borderline elev; CBC wnl; TSH/ PSA/ Sed all wnl...  ~  April 11, 2012:  Yearly ROV & CPX>  Sampson has had a number of issues this past yr- mostly of an Orthopedic/ Neurological/ Allergic nature and outlined below...    No further severe dizzy spells/ near  syncope; BP remains stable, no chest pain/ angina, etc;  He tried off Statins for some time, no change in his status therefore restarted Crestor10mg /d...    Ortho problems included left rotator cuff tear and surg by DrWhitfield 10/12 complicated by an unusual allergic reaction to the suture material w/ erythema in skin and a blister, he took antibiotics & was seen by ID, required a second operation w/ sutures changed 11/12, and a subspecialty Derm eval at [facility] showing allergic reactions to Gentamicin, Bacitracin, Neosporin, Polysporin, Iodine, Triamcinolone...    He continues to f/u w/ DrHawkes for Rheum- CPPD/Pseudogout on Colchicine 0.6mg Bid & Celebrex 200mg  1-2 daily & tolerated well...    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    We reviewed prob list, meds, xrays and labs> see below>> CXR 6/13 showed normal heart size, clear lungs, radiopaque sutures seen in left shoulder, distal clav resection, lower  CSpine plate & screws, mild thoracic spondylosis...   Problem List:  ALLERGIC RHINITIS (ICD-477.9) - eval by DrKozlow et al in 2006... seasonal symptoms treated w/ OTC meds as needed... currently on SINGULAIR 10mg  prn, and NASONEX 2sp Qhs... ~  Note: he had allergic reaction to suture material after left rotator cuff surg 10/12; Specialty Derm eval at [facility] showed allergic to Gentamicin, Bacitracin, Neosporin, Polysporin, Iodine, Triamcinolone...  TINNITUS, CHRONIC (ICD-388.30) - eval by DrKraus in the past... chronic symptom- deals with it effectively using white noise etc...  HYPERTENSION (ICD-401.9) - controlled on  meds- ASA 81mg /d,  NORVASC 10mg /d,  ACCUPRIL 10mg /d,  HCTZ 25mg /d,  KCl 1mEq/d...   ~  6/12:  BP= 134/84 today, takes meds regularly & tol well w/o side effects... BP checks at home and work are similar... denies HA, fatigue, visual changes, CP, palipit, syncope, dyspnea, edema, etc...  ~  6/13:  BP= 138/76 & he remains asymptomatic w/o CP, palpit, SOB, edema, etc...  Hx of RHEUMATIC FEVER (ICD-390) - age 49, hosp for 3 days- no known sequelae; eval by cardiology in the 80's & no valvular heart disease...  Hx of CHEST PAIN w/ neg cardiac eval in 2008... EKG is normal> Episode of DIZZINESS/ Presyncope 6/12 w/ eval by Delton See... ~  Cardiolite in 2005 was neg- no ischemia & EF=60%...  ~  he was hosp w/ CP episode 3/08 by DrBBrodie- felt to be prob pericarditis...  ~  Coronary CTA in 2008 showing sl plaque at an LAD branch vessel (D2), mixed plaque in CIRC, no plaque in prox-mid RCA; Calcium score 0.65 (<25th percentlie)...  ~  2DEcho 3/08 showed no signif valvular lesions, LVwall thickness at upper limit, no regional wall motion abn, LVF= 50-55%... ~  CDopplers 6/12 showed min plaque, no signif ICA stenoses... ~  6/13:  No recurrence of the severe dizziness, near syncope, etc...  HYPERCHOLESTEROLEMIA, BORDERLINE (ICD-272.4) - he started taking Lipitor prev due to reports of poss benefits in Alzheimer's prevention; Prev on LIPITOR 40mg /d and LOVAZA 4gm/d; we stopped the Statin Rx w/ his fasciculations & other symptoms but there was no change off the medication; f/u FLP 6/12 w/ LDL 152 & we decided to try CRESTOR 10mg /d as a trial. ~  FLP 4/08 on Lipitor40 showed TChol 138, TG 45, HDL 49, LDL 80 ~  FLP 9/09 on ?diet alone showed TChol 171, TG 54, HDL 56, LDL 104 ~  FLP 3/11 on Lip40+Lovaza showed TChol 124, TG 51, HDL 63, LDL 51 ~  FLP 6/12 off Lip40 on Lovaza showed TChol 228, TG 53, HDL 58, LDL 152... rec Cres10. ~  FLP 3/13 on Cres10 ([facility] labs) showed TChol 165, TG 57, HDL 65, LDL 89... Continue  same.  GERD (ICD-530.81) - he has been evaluated by [facility]... takes PROTONIX 40mg /d...  COLONIC POLYPS, HX OF (ICD-V12.72) - colonoscopy 4/04 by JYNWGNFA w/ diminutive hyperplastic polyp removed & sm int hem...   ~  f/u colonoscopy done 5/11 was neg- no polyps...  DEGENERATIVE JOINT DISEASE (ICD-715.90)> on CELEBREX 200mg  Bid per DrHawkes... Hx of PSEUDOGOUT (ICD-275.49) - he has hx CPPD w/ eval by Rheum here & [facility]> Rx w/ COLCRYS 0.6mg  Bid... ~  8/11: presented w/ c/o R>L hip pain, XRays showed degen arthritis & mild chondrocalcinosis. ~  Subsequent evals by Rheum- DrHawkes & [facility] confirmed CPPD ~  He has right hip area pain> from lumbar spondylosis & improved after several ESI injections...  LEFT SHOULDER ROTATOR CUFF TEAR >> he is s/p left shoulder  surg 10/12 by DrWhitfield, followed by post op left shoulder inflammation- ?infectious (none found), ?allergic reaction to suture material, required second operation 11/12 to remove sutures & replace w/ stainless steel sutures==> all resolved now, operative notes reviewed...  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 also lumbar spondy w/ spurs at L4-5 seen on prev CTAbd done 12/06... subseq MRI Lumbar area w/ multilevel spondylosis, mod central canal stenosis & 3 level lateral recess stenosis> several epid steroid shots by IR w/ partial relief... ~  6/13:  He reports being on GABAPENTIN 300mg  Tid per DrLove...  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 followed closely by DrPatMauro at [facility] & no known recurrence (switching to [facility]- DrJones)...  ALZHEIMER'S DISEASE,  FAMILY HX (ICD-V17.0) - family hx of Alzheimer's disease in both his mother and father (both died in their 1's w/ this disease)...  R/O Depression>  6/12 wife shared her concern for Olman's mood esp as it regards his medical illnesses; after 3way discussion we decided to hold off on any med Rx but will reconsider if needed in follow up... ~  6/13:  He reports doing better after his surgeries, ESI shots, Derm/Allergy eval at [facility], etc;  He is getting more exercise & played 9holes of golf recently...  DERMATOLOGY/ ALLERGY TESTING at [facility] >> pages supplied by DrBarry scanned into the EPIC EMR for reference- +reaction to Triamcinolone (should avoid Budesonide, Flunisolide, Fluocinolone, hydrocortisone)...   Past Medical History  Diagnosis Date  . Allergic rhinitis   . Tinnitus     chronic  . Hypertension   . History of rheumatic fever   . Hypercholesteremia     borderline  . GERD (gastroesophageal reflux disease)   . History of colonic polyps   . DJD (degenerative joint disease)   . History of pseudogout   . Spondylosis   . Family history of Alzheimer's disease   . History of melanoma   . Chest pain     Nuclear 2005, normal / coronary CTA 2008, slight mixed plaque, coronary calcium score 0.65, ejection fraction 55%, echo, March, 2008  . Ejection fraction     EF 55%, echo, March, 2008.  . Dizziness     Dizziness with question of presyncope April 08, 2011  . Complication of anesthesia     itching after 07/2011 surgery -not sure if it was anesthesia or Oxycodone  . Cancer     Melanoma     Past Surgical History  Procedure Date  . Melanoma excision with sentinel lymph node biopsy 8/02  . Cervical discectomy     anterior with patellar allograft and plating  . Anterior cervical decomp/discectomy fusion     Outpatient Encounter Prescriptions as of 04/11/2012  Medication Sig Dispense Refill  . amLODipine (NORVASC) 10 MG tablet TAKE 1 TABLET DAILY  90 tablet  3  . aspirin 81 MG tablet  Take 81 mg by mouth daily.        . CELEBREX 200 MG capsule TAKE 1 CAPSULE TWICE DAILY  180 capsule  3  . colchicine 0.6 MG tablet Take 0.9 mg by mouth daily.        Marland Kitchen  famotidine (PEPCID) 20 MG tablet Take 20 mg by mouth 2 (two) times daily.        . hydrochlorothiazide 25 MG tablet Take 1 tablet (25 mg total) by mouth daily.  90 tablet  4  . HYDROmorphone (DILAUDID) 2 MG tablet Take 1 tablet (2 mg total) by mouth every 3 (three) hours as needed for pain.  40 tablet  0  . montelukast (SINGULAIR) 10 MG tablet Take 1 tablet (10 mg total) by mouth at bedtime.  90 tablet  3  . NASONEX 50 MCG/ACT nasal spray USE 2 SPRAYS IN EACH NOSTRIL DAILY  3 Inhaler  3  . pantoprazole (PROTONIX) 40 MG tablet TAKE 1 TABLET DAILY  90 tablet  3  . potassium chloride SA (K-DUR,KLOR-CON) 20 MEQ tablet Take 1 tablet (20 mEq total) by mouth daily.  90 tablet  4  . quinapril (ACCUPRIL) 10 MG tablet TAKE 1 TABLET DAILY  90 tablet  3  . rosuvastatin (CRESTOR) 10 MG tablet Take 1 tablet (10 mg total) by mouth at bedtime.  90 tablet  4  . zolpidem (AMBIEN) 10 MG tablet Take 10 mg by mouth at bedtime as needed. For sleep        Allergies  Allergen Reactions  . Moxifloxacin Other (See Comments)    REACTION: pt had severe red rash- he thought from Avelox Rx....  . Triamcinolone Acetonide Other (See Comments)    Unknown      Current Medications, Allergies, Past Medical History, Past Surgical History, Family History, and Social History were reviewed in [employer] record.   Review of Systems         See HPI - all other systems neg except as noted...  The patient denies anorexia, fever, weight loss, weight gain, vision loss, decreased hearing, hoarseness, chest pain, syncope, dyspnea on exertion, peripheral edema, prolonged cough, headaches, hemoptysis, abdominal pain, melena, hematochezia, severe indigestion/heartburn, hematuria, incontinence, muscle weakness, suspicious skin lesions, transient  blindness, difficulty walking, depression, unusual weight change, abnormal bleeding, enlarged lymph nodes, and angioedema.     Objective:   Physical Exam     WD, WN, 66 y/o WM in NAD... Vital Signs:  Reviewed... GENERAL:  Alert & oriented; pleasant & cooperative... HEENT:  [place]/AT, EOM-wnl, PERRLA, EACs-clear, TMs-wnl, NOSE-clear, THROAT-clear & wnl. NECK:  Supple w/ fairROM; no JVD; normal carotid impulses w/o bruits; no thyromegaly or nodules palpated; no lymphadenopathy. CHEST:  Clear to P & A; without wheezes/ rales/ or rhonchi. HEART:  Regular Rhythm; without murmurs/ rubs/ or gallops. ABDOMEN:  Soft & nontender; right peri-umbil scar of melanoma surg; normal bowel sounds; no organomegaly or masses palpated. (RECTAL:  Neg - prostate 2+ & nontender w/o nodules; stool hematest neg; groin lymphoceles) EXT:  mild OA fingers- sl decr ROM hips/ knees; no varicose veins/ +venous insuffic/ no edema; pulses intact & WNL. NEURO:  CN's intact; motor testing normal; sensory testing normal; gait normal & balance OK. DERM:  No lesions noted; scar left shoulder & scar to right of umbilicus.  RADIOLOGY DATA:  Reviewed in the EPIC EMR & discussed w/ the patient...  LABORATORY DATA:  Reviewed in the EPIC EMR & discussed w/ the patient...   Assessment & Plan:   HBP>  BP controlled on meds, continue same...  CARDIAC EVAL by Delton See in 2912 as outlined above... Pt is reassured...  HYPERCHOLESTEROLEMIA>  His LDL off Lipitor was 152 & he started on Crestor10, subseq stopped it for awhile then restarted & FLP 3/13 looked  good; contunue same.  GI>  GERD, Polyps>  Stable on Protonix & up to date on screening...  RHEUM>  OA, CPPD>  Followed by Eppie Gibson on Celebrex & Colcrys...  Hx CSpine fusion, LBP>  Known spondylosis & s/p several epidural steroid shots; he doesn't want surg & will contact DrNudelman when needed....  S/P LEFT SHOULDER SURG w/ Complications>  As outlined above; he is finally better,  back to playng golf etc...  Hx of Muscle Fasciculations>  He's had a thorough Neuro eval from [facility] w/ neg EMG/ NCV (no evid for upper motor neuron dis) & he is reassured...  ALLERGIC REACTION TO SUTURE MATERIAL>  This occurred after his left rotator cuff surg 10/12 7 required re-operation & chanfe to stainless steel wire sutures 11/12...  DERMATOLOGY Eval at [facility]>  With finding of reactions to Gentamicin, Bacitracin, Neosporin, Polysporin, Iodine, Triamcinolone, Gold, others...   Patient's Medications  New Prescriptions   No medications on file  Previous Medications   AMLODIPINE (NORVASC) 10 MG TABLET    TAKE 1 TABLET DAILY   ASPIRIN 81 MG TABLET    Take 81 mg by mouth daily.     CELEBREX 200 MG CAPSULE    TAKE 1 CAPSULE TWICE DAILY   COLCHICINE 0.6 MG TABLET    Take 0.9 mg by mouth 2 (two) times daily.    GABAPENTIN (NEURONTIN) 300 MG CAPSULE    Take 300 mg by mouth 3 (three) times daily. Per Dr. Sandria Manly   HYDROCHLOROTHIAZIDE 25 MG TABLET    Take 1 tablet (25 mg total) by mouth daily.   MONTELUKAST (SINGULAIR) 10 MG TABLET    Take 1 tablet (10 mg total) by mouth at bedtime.   NASONEX 50 MCG/ACT NASAL SPRAY    USE 2 SPRAYS IN EACH NOSTRIL DAILY   PANTOPRAZOLE (PROTONIX) 40 MG TABLET    TAKE 1 TABLET DAILY   POTASSIUM CHLORIDE SA (K-DUR,KLOR-CON) 20 MEQ TABLET    Take 1 tablet (20 mEq total) by mouth daily.   QUINAPRIL (ACCUPRIL) 10 MG TABLET    TAKE 1 TABLET DAILY   ROSUVASTATIN (CRESTOR) 10 MG TABLET    Take 1 tablet (10 mg total) by mouth at bedtime.  Modified Medications   No medications on file  Discontinued Medications   FAMOTIDINE (PEPCID) 20 MG TABLET    Take 20 mg by mouth 2 (two) times daily.     HYDROMORPHONE (DILAUDID) 2 MG TABLET    Take 1 tablet (2 mg total) by mouth every 3 (three) hours as needed for pain.   ZOLPIDEM (AMBIEN) 10 MG TABLET    Take 10 mg by mouth at bedtime as needed. For sleep

## 2012-04-12 ENCOUNTER — Encounter: Payer: Self-pay | Admitting: Pulmonary Disease

## 2012-04-12 NOTE — Patient Instructions (Addendum)
Today we updated your med list in our EPIC system...    Continue your current medications the same...  We reviewed this past yrs history, surgeries, & subspecialty evaluations...  Call for any questions or if we can be of service in any way.Marland KitchenMarland Kitchen

## 2012-04-13 ENCOUNTER — Other Ambulatory Visit: Payer: Self-pay | Admitting: Pulmonary Disease

## 2012-04-13 MED ORDER — PROPRANOLOL HCL 20 MG PO TABS: ORAL_TABLET | ORAL | Status: DC

## 2012-04-22 ENCOUNTER — Encounter: Payer: Self-pay | Admitting: Pulmonary Disease

## 2012-05-02 ENCOUNTER — Telehealth: Payer: Self-pay | Admitting: Pulmonary Disease

## 2012-05-02 NOTE — Telephone Encounter (Signed)
I looked in pt chart and did not see any Hep B or polio vaccine information and per leigh pt would need to contact the health dept regarding this. Pt aware he had tdap 04/13/11. He will call the health dept. Nothing further was needed

## 2012-05-06 ENCOUNTER — Other Ambulatory Visit: Payer: Self-pay | Admitting: Pulmonary Disease

## 2012-06-30 ENCOUNTER — Other Ambulatory Visit: Payer: Self-pay | Admitting: Orthopaedic Surgery

## 2012-06-30 DIAGNOSIS — M25559 Pain in unspecified hip: Secondary | ICD-10-CM

## 2012-07-12 ENCOUNTER — Ambulatory Visit
Admission: RE | Admit: 2012-07-12 | Discharge: 2012-07-12 | Disposition: A | Discharge status: Home / Self Care | Payer: PRIVATE HEALTH INSURANCE | Source: Ambulatory Visit | Attending: Orthopaedic Surgery | Admitting: Orthopaedic Surgery

## 2012-07-12 DIAGNOSIS — M25559 Pain in unspecified hip: Secondary | ICD-10-CM

## 2012-07-12 MED ORDER — METHYLPREDNISOLONE ACETATE 40 MG/ML INJ SUSP (RADIOLOG
120.0000 mg | Freq: Once | INTRAMUSCULAR | Status: AC
  Administered 2012-07-12: 120 mg via EPIDURAL

## 2012-07-12 MED ORDER — IOHEXOL 180 MG/ML  SOLN
1.0000 mL | Freq: Once | INTRAMUSCULAR | Status: AC | PRN
  Administered 2012-07-12: 1 mL via EPIDURAL

## 2012-07-19 ENCOUNTER — Encounter (HOSPITAL_BASED_OUTPATIENT_CLINIC_OR_DEPARTMENT_OTHER): Admission: RE | Payer: Self-pay | Source: Ambulatory Visit

## 2012-07-19 ENCOUNTER — Ambulatory Visit (HOSPITAL_BASED_OUTPATIENT_CLINIC_OR_DEPARTMENT_OTHER)
Admission: RE | Admit: 2012-07-19 | Payer: PRIVATE HEALTH INSURANCE | Source: Ambulatory Visit | Admitting: Orthopedic Surgery

## 2012-07-19 SURGERY — CARPAL TUNNEL RELEASE
Anesthesia: General | Laterality: Right

## 2012-07-22 ENCOUNTER — Other Ambulatory Visit: Payer: Self-pay | Admitting: Pulmonary Disease

## 2012-07-22 MED ORDER — ROSUVASTATIN CALCIUM 10 MG PO TABS: 10.0000 mg | ORAL_TABLET | Freq: Every day | ORAL | Status: DC

## 2012-07-25 ENCOUNTER — Other Ambulatory Visit: Payer: Self-pay | Admitting: Orthopaedic Surgery

## 2012-07-25 DIAGNOSIS — M25559 Pain in unspecified hip: Secondary | ICD-10-CM

## 2012-07-26 ENCOUNTER — Other Ambulatory Visit: Payer: PRIVATE HEALTH INSURANCE

## 2012-07-26 ENCOUNTER — Ambulatory Visit
Admission: RE | Admit: 2012-07-26 | Discharge: 2012-07-26 | Disposition: A | Discharge status: Home / Self Care | Payer: PRIVATE HEALTH INSURANCE | Source: Ambulatory Visit | Attending: Orthopaedic Surgery | Admitting: Orthopaedic Surgery

## 2012-07-26 DIAGNOSIS — M25559 Pain in unspecified hip: Secondary | ICD-10-CM

## 2012-07-26 MED ORDER — METHYLPREDNISOLONE ACETATE 40 MG/ML INJ SUSP (RADIOLOG
120.0000 mg | Freq: Once | INTRAMUSCULAR | Status: AC
  Administered 2012-07-26: 120 mg via EPIDURAL

## 2012-07-26 MED ORDER — IOHEXOL 180 MG/ML  SOLN
1.0000 mL | Freq: Once | INTRAMUSCULAR | Status: AC | PRN
  Administered 2012-07-26: 1 mL via EPIDURAL

## 2012-09-29 ENCOUNTER — Telehealth: Payer: Self-pay | Admitting: Pulmonary Disease

## 2012-09-29 NOTE — Telephone Encounter (Signed)
SN will review these papers and i will call pt once they are completed.

## 2012-10-05 ENCOUNTER — Ambulatory Visit (INDEPENDENT_AMBULATORY_CARE_PROVIDER_SITE_OTHER): Payer: PRIVATE HEALTH INSURANCE

## 2012-10-05 DIAGNOSIS — Z23 Encounter for immunization: Secondary | ICD-10-CM

## 2012-10-05 NOTE — Telephone Encounter (Signed)
[name] came by the office this morning and got his PNA vaccine and picked up his paperwork.  Nothing further is needed.

## 2012-12-27 ENCOUNTER — Other Ambulatory Visit: Payer: Self-pay | Admitting: Orthopaedic Surgery

## 2012-12-27 DIAGNOSIS — M545 Low back pain, unspecified: Secondary | ICD-10-CM

## 2012-12-27 DIAGNOSIS — M4800 Spinal stenosis, site unspecified: Secondary | ICD-10-CM

## 2012-12-27 DIAGNOSIS — M479 Spondylosis, unspecified: Secondary | ICD-10-CM

## 2013-01-09 ENCOUNTER — Other Ambulatory Visit: Payer: Self-pay | Admitting: Pulmonary Disease

## 2013-01-09 MED ORDER — POTASSIUM CHLORIDE CRYS ER 20 MEQ PO TBCR: 20.0000 meq | EXTENDED_RELEASE_TABLET | Freq: Every day | ORAL | Status: DC

## 2013-01-11 ENCOUNTER — Other Ambulatory Visit: Payer: PRIVATE HEALTH INSURANCE

## 2013-01-11 ENCOUNTER — Ambulatory Visit
Admission: RE | Admit: 2013-01-11 | Discharge: 2013-01-11 | Disposition: A | Discharge status: Home / Self Care | Payer: PRIVATE HEALTH INSURANCE | Source: Ambulatory Visit | Attending: Orthopaedic Surgery | Admitting: Orthopaedic Surgery

## 2013-01-11 DIAGNOSIS — M545 Low back pain, unspecified: Secondary | ICD-10-CM

## 2013-01-11 DIAGNOSIS — M4800 Spinal stenosis, site unspecified: Secondary | ICD-10-CM

## 2013-01-11 DIAGNOSIS — M479 Spondylosis, unspecified: Secondary | ICD-10-CM

## 2013-01-11 MED ORDER — METHYLPREDNISOLONE ACETATE 40 MG/ML INJ SUSP (RADIOLOG
120.0000 mg | Freq: Once | INTRAMUSCULAR | Status: AC
  Administered 2013-01-11: 120 mg via EPIDURAL

## 2013-01-11 MED ORDER — IOHEXOL 180 MG/ML  SOLN
1.0000 mL | Freq: Once | INTRAMUSCULAR | Status: AC | PRN
  Administered 2013-01-11: 1 mL via EPIDURAL

## 2013-01-22 ENCOUNTER — Other Ambulatory Visit: Payer: Self-pay | Admitting: Pulmonary Disease

## 2013-01-25 ENCOUNTER — Other Ambulatory Visit: Payer: Self-pay | Admitting: Orthopaedic Surgery

## 2013-01-25 DIAGNOSIS — M549 Dorsalgia, unspecified: Secondary | ICD-10-CM

## 2013-01-25 DIAGNOSIS — M25551 Pain in right hip: Secondary | ICD-10-CM

## 2013-02-08 ENCOUNTER — Ambulatory Visit
Admission: RE | Admit: 2013-02-08 | Discharge: 2013-02-08 | Disposition: A | Discharge status: Home / Self Care | Payer: PRIVATE HEALTH INSURANCE | Source: Ambulatory Visit | Attending: Orthopaedic Surgery | Admitting: Orthopaedic Surgery

## 2013-02-08 DIAGNOSIS — M549 Dorsalgia, unspecified: Secondary | ICD-10-CM

## 2013-02-08 DIAGNOSIS — M25551 Pain in right hip: Secondary | ICD-10-CM

## 2013-02-08 MED ORDER — METHYLPREDNISOLONE ACETATE 40 MG/ML INJ SUSP (RADIOLOG
120.0000 mg | Freq: Once | INTRAMUSCULAR | Status: AC
  Administered 2013-02-08: 120 mg via EPIDURAL

## 2013-02-08 MED ORDER — IOHEXOL 180 MG/ML  SOLN
1.0000 mL | Freq: Once | INTRAMUSCULAR | Status: AC | PRN
  Administered 2013-02-08: 1 mL via EPIDURAL

## 2013-02-09 ENCOUNTER — Other Ambulatory Visit: Payer: Self-pay | Admitting: Neurology

## 2013-02-09 MED ORDER — GABAPENTIN 100 MG PO CAPS: 100.0000 mg | ORAL_CAPSULE | Freq: Three times a day (TID) | ORAL | Status: DC

## 2013-02-13 ENCOUNTER — Telehealth: Payer: Self-pay | Admitting: Neurology

## 2013-02-13 NOTE — Telephone Encounter (Signed)
Pt calling to have [place] make sure that the pharmacy that he uses is the [facility].  And checking this is the pharmacy on EPIC for gabapentin.  Was refilled at this pharmacy on 02-09-13.

## 2013-04-05 ENCOUNTER — Ambulatory Visit
Admission: RE | Admit: 2013-04-05 | Discharge: 2013-04-05 | Disposition: A | Discharge status: Home / Self Care | Payer: PRIVATE HEALTH INSURANCE | Source: Ambulatory Visit | Attending: Orthopaedic Surgery | Admitting: Orthopaedic Surgery

## 2013-04-05 ENCOUNTER — Other Ambulatory Visit: Payer: Self-pay | Admitting: Orthopaedic Surgery

## 2013-04-05 DIAGNOSIS — M545 Low back pain, unspecified: Secondary | ICD-10-CM

## 2013-04-05 MED ORDER — IOHEXOL 180 MG/ML  SOLN
1.0000 mL | Freq: Once | INTRAMUSCULAR | Status: AC | PRN
  Administered 2013-04-05: 1 mL via EPIDURAL

## 2013-04-05 MED ORDER — METHYLPREDNISOLONE ACETATE 40 MG/ML INJ SUSP (RADIOLOG
120.0000 mg | Freq: Once | INTRAMUSCULAR | Status: AC
  Administered 2013-04-05: 120 mg via EPIDURAL

## 2013-04-16 ENCOUNTER — Other Ambulatory Visit: Payer: Self-pay | Admitting: Pulmonary Disease

## 2013-05-11 ENCOUNTER — Other Ambulatory Visit: Payer: Self-pay | Admitting: Orthopaedic Surgery

## 2013-05-11 DIAGNOSIS — M25559 Pain in unspecified hip: Secondary | ICD-10-CM

## 2013-05-16 ENCOUNTER — Telehealth: Payer: Self-pay | Admitting: Pulmonary Disease

## 2013-05-16 ENCOUNTER — Other Ambulatory Visit: Payer: Self-pay | Admitting: Pulmonary Disease

## 2013-05-16 NOTE — Telephone Encounter (Signed)
Called [facility] for [name] as he was going to run out of his medication --accupril while he is on vacation.  [facility] did an override of his medication and they will go ahead and fill this for the pt .  i have called and spoke with [name] and he is aware.  They will get this out in the mail to the pt. Nothing further is needed.

## 2013-05-29 ENCOUNTER — Other Ambulatory Visit: Payer: Self-pay | Admitting: Pulmonary Disease

## 2013-05-29 MED ORDER — CELECOXIB 200 MG PO CAPS: ORAL_CAPSULE | ORAL | Status: DC

## 2013-06-05 ENCOUNTER — Encounter: Payer: Self-pay | Admitting: *Deleted

## 2013-06-05 DIAGNOSIS — H04129 Dry eye syndrome of unspecified lacrimal gland: Secondary | ICD-10-CM | POA: Insufficient documentation

## 2013-06-05 DIAGNOSIS — IMO0002 Reserved for concepts with insufficient information to code with codable children: Secondary | ICD-10-CM | POA: Insufficient documentation

## 2013-06-09 ENCOUNTER — Ambulatory Visit (INDEPENDENT_AMBULATORY_CARE_PROVIDER_SITE_OTHER): Payer: PRIVATE HEALTH INSURANCE | Admitting: Neurology

## 2013-06-09 ENCOUNTER — Encounter: Payer: Self-pay | Admitting: Neurology

## 2013-06-09 VITALS — BP 149/90 | HR 86 | Wt 183.0 lb

## 2013-06-09 DIAGNOSIS — M4807 Spinal stenosis, lumbosacral region: Secondary | ICD-10-CM | POA: Insufficient documentation

## 2013-06-09 DIAGNOSIS — M48061 Spinal stenosis, lumbar region without neurogenic claudication: Secondary | ICD-10-CM

## 2013-06-09 DIAGNOSIS — M4716 Other spondylosis with myelopathy, lumbar region: Secondary | ICD-10-CM

## 2013-06-09 HISTORY — DX: Other spondylosis with myelopathy, lumbar region: M47.16

## 2013-06-09 NOTE — Progress Notes (Signed)
Reason for visit: Back pain  Peter [name] is an 67 y.o. 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 The patient has a three-level problem, however. The patient has pseudo-claudication that mainly involves pain into the right hip area with walking greater than 200 yards. The patient will have to stop and rest, and he is able to walk again. The patient also has some stiffness in the back and hip area of when he first gets up in the morning, but this improves when he moves around and stretches. The patient is exercising 3 times a week, and he does yoga which is beneficial. The patient has lost weight to help his back issues. The patient has had cervical spine surgery previously as well. The patient denies any weakness of the legs, or problems with balance or problems controlling the bowels or the bladder. The patient is on gabapentin and Celebrex, and he has had a recent injection in the left knee for pseudogout. The patient returns for an evaluation. He does not note any decline in his physical abilities over the last several years.  Past Medical History  Diagnosis Date  . Allergic rhinitis   . Tinnitus     chronic  . Hypertension   . History of rheumatic fever   . Hypercholesteremia     borderline  . GERD (gastroesophageal reflux disease)   . History of colonic polyps   . DJD (degenerative joint disease)   . History of pseudogout   . Spondylosis   . Family history of Alzheimer's disease   . History of melanoma   . Chest pain     Nuclear 2005, normal / coronary CTA 2008, slight mixed plaque, coronary calcium score 0.65, ejection fraction 55%, echo, March, 2008  . Ejection fraction     EF 55%, echo, March, 2008.  . Dizziness     Dizziness with question of presyncope April 08, 2011  . Complication of anesthesia     itching after 07/2011 surgery -not sure if  it was anesthesia or Oxycodone  . Cancer     Melanoma   . 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 Melanoma excision with sentinel lymph node biopsy  8/02  . Cervical discectomy      anterior with patellar allograft and plating  . Anterior cervical decomp/discectomy fusion    . Rotator cuff repair N/A     08/13/11  08/27/2011    Family History  Problem Relation Age of Onset  . Alzheimer's disease Father   . Alzheimer's disease Mother     Social history:  reports that he has never smoked. He has never used smokeless tobacco. He reports that  drinks alcohol. He reports that he does not use illicit drugs.  Allergies:  Allergies  Allergen Reactions  . Gentamycin [Gentamicin]   . Neosporin [Neomycin-Bacitracin Zn-Polymyx]   . Moxifloxacin Other (See Comments)    REACTION: pt had severe red rash- he thought from Avelox Rx....  . Triamcinolone Acetonide Other (See Comments)    Unknown      Medications:  Current Outpatient Prescriptions on File Prior to Visit  Medication Sig Dispense Refill  . amLODipine (NORVASC) 10 MG tablet TAKE 1 TABLET DAILY  90 tablet  2  . aspirin 81 MG tablet Take 81 mg by mouth daily.        Marland Kitchen  celecoxib (CELEBREX) 200 MG capsule TAKE 1 CAPSULE TWICE DAILY  180 capsule  3  . colchicine 0.6 MG tablet Take 0.9 mg by mouth 2 (two) times daily.       . hydrochlorothiazide (HYDRODIURIL) 25 MG tablet TAKE 1 TABLET DAILY  90 tablet  3  . pantoprazole (PROTONIX) 40 MG tablet TAKE 1 TABLET DAILY  90 tablet  3  . potassium chloride SA (K-DUR,KLOR-CON) 20 MEQ tablet Take 1 tablet (20 mEq total) by mouth daily.  90 tablet  4  . quinapril (ACCUPRIL) 10 MG tablet TAKE 1 TABLET DAILY  90 tablet  3  . rosuvastatin (CRESTOR) 10 MG tablet Take 1 tablet (10 mg total) by mouth at bedtime.  90 tablet  4   No current facility-administered medications on file prior to visit.    ROS:  Out of a complete 14  system review of symptoms, the patient complains only of the following symptoms, and all other reviewed systems are negative.  Hearing loss, ringing in the ears Joint pain Numbness of the extremities  Blood pressure 149/90, pulse 86, weight 183 lb (83.008 kg).  Physical Exam  General: The patient is alert and cooperative at the time of the examination.  Neuromuscular: The patient has fairly good range of movement of the lumbosacral spine.  Skin: No significant peripheral edema is noted.   Neurologic Exam  Cranial nerves: Facial symmetry is present. Speech is normal, no aphasia or dysarthria is noted. Extraocular movements are full. Visual fields are full.  Motor: The patient has good strength in all 4 extremities. The patient is able to walk on heels and the toes.  Coordination: The patient has good finger-nose-finger and heel-to-shin bilaterally.  Gait and station: The patient has a normal gait. Tandem gait is normal. Romberg is negative. No drift is seen.  Reflexes: Deep tendon reflexes are symmetric, but are depressed.   Assessment/Plan:  One. Lumbosacral spinal stenosis  2. Pseudogout  The patient is able to function relatively well, but he is limited with prolonged walking. Epidural steroid injections do help. The patient will be going for an injection in the future. The patient will remain on gabapentin for now, and we will set up an opinion through Dr. Danielle Dess for the low back issues. The patient will followup in one year.  [name] 06/11/2013 2:26 PM  [facility] [address] [place], [place] [address]  Phone [phone] Fax [phone]

## 2013-06-11 ENCOUNTER — Encounter: Payer: Self-pay | Admitting: Neurology

## 2013-06-13 ENCOUNTER — Other Ambulatory Visit: Payer: PRIVATE HEALTH INSURANCE

## 2013-06-13 ENCOUNTER — Other Ambulatory Visit: Payer: Self-pay | Admitting: Neurosurgery

## 2013-06-13 ENCOUNTER — Ambulatory Visit
Admission: RE | Admit: 2013-06-13 | Discharge: 2013-06-13 | Disposition: A | Discharge status: Home / Self Care | Payer: PRIVATE HEALTH INSURANCE | Source: Ambulatory Visit | Attending: Neurosurgery | Admitting: Neurosurgery

## 2013-06-13 DIAGNOSIS — R200 Anesthesia of skin: Secondary | ICD-10-CM

## 2013-06-14 ENCOUNTER — Other Ambulatory Visit: Payer: PRIVATE HEALTH INSURANCE

## 2013-06-14 ENCOUNTER — Ambulatory Visit
Admission: RE | Admit: 2013-06-14 | Discharge: 2013-06-14 | Disposition: A | Discharge status: Home / Self Care | Payer: PRIVATE HEALTH INSURANCE | Source: Ambulatory Visit | Attending: Orthopaedic Surgery | Admitting: Orthopaedic Surgery

## 2013-06-14 DIAGNOSIS — M25559 Pain in unspecified hip: Secondary | ICD-10-CM

## 2013-06-14 MED ORDER — IOHEXOL 180 MG/ML  SOLN
1.0000 mL | Freq: Once | INTRAMUSCULAR | Status: AC | PRN
  Administered 2013-06-14: 1 mL via EPIDURAL

## 2013-06-14 MED ORDER — METHYLPREDNISOLONE ACETATE 40 MG/ML INJ SUSP (RADIOLOG
120.0000 mg | Freq: Once | INTRAMUSCULAR | Status: AC
  Administered 2013-06-14: 120 mg via EPIDURAL

## 2013-06-15 ENCOUNTER — Other Ambulatory Visit: Payer: Self-pay | Admitting: Neurosurgery

## 2013-06-15 ENCOUNTER — Ambulatory Visit
Admission: RE | Admit: 2013-06-15 | Discharge: 2013-06-15 | Disposition: A | Discharge status: Home / Self Care | Payer: PRIVATE HEALTH INSURANCE | Source: Ambulatory Visit | Attending: Neurosurgery | Admitting: Neurosurgery

## 2013-06-15 DIAGNOSIS — IMO0002 Reserved for concepts with insufficient information to code with codable children: Secondary | ICD-10-CM

## 2013-07-08 ENCOUNTER — Telehealth: Payer: Self-pay | Admitting: Pulmonary Disease

## 2013-07-08 MED ORDER — CEFACLOR 250 MG PO CAPS: 250.0000 mg | ORAL_CAPSULE | Freq: Three times a day (TID) | ORAL | Status: DC

## 2013-07-08 NOTE — Telephone Encounter (Signed)
Dr.Nadel's patient.  Reports sinusitis symptoms.  Usually takes Cefaclor, prescribed.

## 2013-07-10 ENCOUNTER — Ambulatory Visit: Payer: Self-pay | Admitting: Neurology

## 2013-07-12 ENCOUNTER — Encounter: Payer: Self-pay | Admitting: Rheumatology

## 2013-07-16 ENCOUNTER — Other Ambulatory Visit: Payer: Self-pay | Admitting: Pulmonary Disease

## 2013-08-09 ENCOUNTER — Other Ambulatory Visit: Payer: Self-pay | Admitting: Orthopaedic Surgery

## 2013-08-09 DIAGNOSIS — M549 Dorsalgia, unspecified: Secondary | ICD-10-CM

## 2013-08-13 ENCOUNTER — Other Ambulatory Visit: Payer: Self-pay | Admitting: Pulmonary Disease

## 2013-08-28 ENCOUNTER — Other Ambulatory Visit: Payer: PRIVATE HEALTH INSURANCE

## 2013-08-28 ENCOUNTER — Other Ambulatory Visit: Payer: Self-pay | Admitting: Neurosurgery

## 2013-08-29 ENCOUNTER — Ambulatory Visit
Admission: RE | Admit: 2013-08-29 | Discharge: 2013-08-29 | Disposition: A | Discharge status: Home / Self Care | Payer: PRIVATE HEALTH INSURANCE | Source: Ambulatory Visit | Attending: Orthopaedic Surgery | Admitting: Orthopaedic Surgery

## 2013-08-29 DIAGNOSIS — M4716 Other spondylosis with myelopathy, lumbar region: Secondary | ICD-10-CM

## 2013-08-29 DIAGNOSIS — M549 Dorsalgia, unspecified: Secondary | ICD-10-CM

## 2013-08-29 MED ORDER — IOHEXOL 180 MG/ML  SOLN
1.0000 mL | Freq: Once | INTRAMUSCULAR | Status: AC | PRN
  Administered 2013-08-29: 1 mL via EPIDURAL

## 2013-08-29 MED ORDER — METHYLPREDNISOLONE ACETATE 40 MG/ML INJ SUSP (RADIOLOG
120.0000 mg | Freq: Once | INTRAMUSCULAR | Status: AC
  Administered 2013-08-29: 120 mg via EPIDURAL

## 2013-09-01 ENCOUNTER — Other Ambulatory Visit: Payer: PRIVATE HEALTH INSURANCE

## 2013-09-18 ENCOUNTER — Encounter (HOSPITAL_COMMUNITY): Payer: Self-pay | Admitting: Pharmacy Technician

## 2013-09-20 ENCOUNTER — Other Ambulatory Visit: Payer: Self-pay | Admitting: Neurosurgery

## 2013-09-20 ENCOUNTER — Other Ambulatory Visit: Payer: Self-pay | Admitting: Pulmonary Disease

## 2013-09-20 ENCOUNTER — Ambulatory Visit
Admission: RE | Admit: 2013-09-20 | Discharge: 2013-09-20 | Disposition: A | Discharge status: Home / Self Care | Payer: PRIVATE HEALTH INSURANCE | Source: Ambulatory Visit | Attending: Neurosurgery | Admitting: Neurosurgery

## 2013-09-20 DIAGNOSIS — Z01811 Encounter for preprocedural respiratory examination: Secondary | ICD-10-CM

## 2013-09-20 MED ORDER — ALPRAZOLAM 0.5 MG PO TABS: ORAL_TABLET | ORAL | Status: DC

## 2013-09-20 NOTE — Telephone Encounter (Signed)
Pt is aware [facility]

## 2013-09-22 ENCOUNTER — Encounter (HOSPITAL_COMMUNITY): Payer: Self-pay

## 2013-09-22 ENCOUNTER — Encounter (HOSPITAL_COMMUNITY)
Admission: RE | Admit: 2013-09-22 | Discharge: 2013-09-22 | Disposition: A | Discharge status: Home / Self Care | Payer: PRIVATE HEALTH INSURANCE | Source: Ambulatory Visit | Attending: Neurosurgery | Admitting: Neurosurgery

## 2013-09-22 ENCOUNTER — Other Ambulatory Visit (HOSPITAL_COMMUNITY): Payer: Self-pay | Admitting: *Deleted

## 2013-09-22 DIAGNOSIS — Z01812 Encounter for preprocedural laboratory examination: Secondary | ICD-10-CM | POA: Insufficient documentation

## 2013-09-22 DIAGNOSIS — Z01810 Encounter for preprocedural cardiovascular examination: Secondary | ICD-10-CM | POA: Insufficient documentation

## 2013-09-22 DIAGNOSIS — Z01818 Encounter for other preprocedural examination: Secondary | ICD-10-CM | POA: Insufficient documentation

## 2013-09-22 HISTORY — DX: Cardiac murmur, unspecified: R01.1

## 2013-09-22 HISTORY — DX: Personal history of other diseases of the digestive system: Z87.19

## 2013-09-22 HISTORY — DX: Anxiety disorder, unspecified: F41.9

## 2013-09-22 LAB — BASIC METABOLIC PANEL
BUN: 19 mg/dL (ref 6–23)
CO2: 30 mEq/L (ref 19–32)
Calcium: 9.9 mg/dL (ref 8.4–10.5)
Chloride: 103 mEq/L (ref 96–112)
Creatinine, Ser: 0.93 mg/dL (ref 0.50–1.35)
GFR calc Af Amer: >90 mL/min (ref >90)
GFR calc non Af Amer: 86 mL/min — ABNORMAL LOW (ref >90)
Glucose, Bld: 98 mg/dL (ref 70–99)
Potassium: 5.4 mEq/L — ABNORMAL HIGH (ref 3.5–5.1)
Sodium: 139 mEq/L (ref 135–145)

## 2013-09-22 LAB — TYPE AND SCREEN
ABO/RH(D): O POS
Antibody Screen: NEGATIVE

## 2013-09-22 LAB — SURGICAL PCR SCREEN
MRSA, PCR: NEGATIVE
Staphylococcus aureus: NEGATIVE

## 2013-09-22 LAB — CBC
HCT: 45.8 % (ref 39.0–52.0)
Hemoglobin: 15.9 g/dL (ref 13.0–17.0)
MCH: 32 pg (ref 26.0–34.0)
MCHC: 34.7 g/dL (ref 30.0–36.0)
MCV: 92.2 fL (ref 78.0–100.0)
Platelets: 256 10*3/uL (ref 150–400)
RBC: 4.97 MIL/uL (ref 4.22–5.81)
RDW: 12.1 % (ref 11.5–15.5)
WBC: 7.7 10*3/uL (ref 4.0–10.5)

## 2013-09-22 LAB — ABO/RH: ABO/RH(D): O POS

## 2013-09-22 NOTE — Pre-Procedure Instructions (Addendum)
Peter [name]  09/22/2013   Your procedure is scheduled on:  10/02/13  Report to [facility] short stay admitting at 530 AM.  Call this number if you have problems the morning of surgery: [phone]   Remember:   Do not eat food or drink liquids after midnight.   Take these medicines the morning of surgery with A SIP OF WATER: xanax if needed, amlodipine, protonix,,gabapentin,                 STOP all herbel meds, nsaids (aleve,naproxen,advil,ibuprofen)  Including celebrex today   Do not wear jewelry, make-up or nail polish.  Do not wear lotions, powders, or perfumes. You may wear deodorant.  Do not shave 48 hours prior to surgery. Men may shave face and neck.  Do not bring valuables to the hospital.  [facility] is not responsible                  for any belongings or valuables.               Contacts, dentures or bridgework may not be worn into surgery.  Leave suitcase in the car. After surgery it may be brought to your room.  For patients admitted to the hospital, discharge time is determined by your                treatment team.               Patients discharged the day of surgery will not be allowed to drive  home.  Name and phone number of your driver:   Special Instructions: Shower using CHG 2 nights before surgery and the night before surgery.  If you shower the day of surgery use CHG.  Use special wash - you have one bottle of CHG for all showers.  You should use approximately 1/3 of the bottle for each shower.   Please read over the following fact sheets that you were given: Pain Booklet, Coughing and Deep Breathing, Blood Transfusion Information, MRSA Information and Surgical Site Infection Prevention

## 2013-09-22 NOTE — Progress Notes (Signed)
Patient had reaction  To ?sutures, bandage when had shoulder done 12. Allergy test done in ws info in chart

## 2013-09-25 ENCOUNTER — Other Ambulatory Visit (HOSPITAL_COMMUNITY): Payer: PRIVATE HEALTH INSURANCE

## 2013-09-25 HISTORY — PX: LUMBAR FUSION: SHX111

## 2013-10-01 MED ORDER — CEFAZOLIN SODIUM-DEXTROSE 2-3 GM-% IV SOLR
2.0000 g | INTRAVENOUS | Status: AC
  Administered 2013-10-02 (×2): 2 g via INTRAVENOUS
  Filled 2013-10-01: qty 50

## 2013-10-02 ENCOUNTER — Inpatient Hospital Stay (HOSPITAL_COMMUNITY): Payer: PRIVATE HEALTH INSURANCE

## 2013-10-02 ENCOUNTER — Inpatient Hospital Stay (HOSPITAL_COMMUNITY): Payer: PRIVATE HEALTH INSURANCE | Admitting: Anesthesiology

## 2013-10-02 ENCOUNTER — Encounter (HOSPITAL_COMMUNITY)
Admission: RE | Disposition: A | Discharge status: Home / Self Care | Payer: PRIVATE HEALTH INSURANCE | Source: Ambulatory Visit | Attending: Neurosurgery

## 2013-10-02 ENCOUNTER — Observation Stay (HOSPITAL_COMMUNITY)
Admission: RE | Admit: 2013-10-02 | Discharge: 2013-10-04 | Disposition: A | Discharge status: Home / Self Care | Payer: PRIVATE HEALTH INSURANCE | Source: Ambulatory Visit | Attending: Neurosurgery | Admitting: Neurosurgery

## 2013-10-02 ENCOUNTER — Encounter (HOSPITAL_COMMUNITY): Payer: Self-pay | Admitting: *Deleted

## 2013-10-02 ENCOUNTER — Observation Stay (HOSPITAL_COMMUNITY): Payer: PRIVATE HEALTH INSURANCE

## 2013-10-02 ENCOUNTER — Encounter (HOSPITAL_COMMUNITY): Payer: PRIVATE HEALTH INSURANCE | Admitting: Anesthesiology

## 2013-10-02 DIAGNOSIS — I10 Essential (primary) hypertension: Secondary | ICD-10-CM | POA: Insufficient documentation

## 2013-10-02 DIAGNOSIS — K449 Diaphragmatic hernia without obstruction or gangrene: Secondary | ICD-10-CM | POA: Insufficient documentation

## 2013-10-02 DIAGNOSIS — M47817 Spondylosis without myelopathy or radiculopathy, lumbosacral region: Principal | ICD-10-CM | POA: Insufficient documentation

## 2013-10-02 DIAGNOSIS — R011 Cardiac murmur, unspecified: Secondary | ICD-10-CM | POA: Insufficient documentation

## 2013-10-02 DIAGNOSIS — Q762 Congenital spondylolisthesis: Secondary | ICD-10-CM | POA: Insufficient documentation

## 2013-10-02 DIAGNOSIS — E7800 Pure hypercholesterolemia, unspecified: Secondary | ICD-10-CM | POA: Insufficient documentation

## 2013-10-02 DIAGNOSIS — M1990 Unspecified osteoarthritis, unspecified site: Secondary | ICD-10-CM | POA: Insufficient documentation

## 2013-10-02 DIAGNOSIS — M48062 Spinal stenosis, lumbar region with neurogenic claudication: Secondary | ICD-10-CM | POA: Diagnosis present

## 2013-10-02 DIAGNOSIS — M1120 Other chondrocalcinosis, unspecified site: Secondary | ICD-10-CM | POA: Insufficient documentation

## 2013-10-02 DIAGNOSIS — M5137 Other intervertebral disc degeneration, lumbosacral region: Secondary | ICD-10-CM | POA: Insufficient documentation

## 2013-10-02 DIAGNOSIS — K219 Gastro-esophageal reflux disease without esophagitis: Secondary | ICD-10-CM | POA: Insufficient documentation

## 2013-10-02 DIAGNOSIS — M51379 Other intervertebral disc degeneration, lumbosacral region without mention of lumbar back pain or lower extremity pain: Secondary | ICD-10-CM | POA: Insufficient documentation

## 2013-10-02 SURGERY — POSTERIOR LUMBAR FUSION 2 LEVEL
Anesthesia: General | Site: Back

## 2013-10-02 MED ORDER — ONDANSETRON HCL 4 MG/2ML IJ SOLN
4.0000 mg | Freq: Four times a day (QID) | INTRAMUSCULAR | Status: DC | PRN
  Administered 2013-10-02: 4 mg via INTRAVENOUS
  Filled 2013-10-02: qty 2

## 2013-10-02 MED ORDER — MENTHOL 3 MG MT LOZG
1.0000 | LOZENGE | OROMUCOSAL | Status: DC | PRN
  Filled 2013-10-02: qty 9

## 2013-10-02 MED ORDER — ONDANSETRON HCL 4 MG/2ML IJ SOLN
INTRAMUSCULAR | Status: DC | PRN
  Administered 2013-10-02 (×2): 4 mg via INTRAVENOUS

## 2013-10-02 MED ORDER — GABAPENTIN 300 MG PO CAPS
300.0000 mg | ORAL_CAPSULE | Freq: Three times a day (TID) | ORAL | Status: DC
  Administered 2013-10-02 – 2013-10-03 (×5): 300 mg via ORAL
  Filled 2013-10-02 (×10): qty 1

## 2013-10-02 MED ORDER — MAGNESIUM HYDROXIDE 400 MG/5ML PO SUSP: 30.0000 mL | Freq: Every day | ORAL | Status: DC | PRN

## 2013-10-02 MED ORDER — SODIUM CHLORIDE 0.9 % IJ SOLN
3.0000 mL | Freq: Two times a day (BID) | INTRAMUSCULAR | Status: DC
  Administered 2013-10-02 – 2013-10-03 (×3): 3 mL via INTRAVENOUS

## 2013-10-02 MED ORDER — CEFAZOLIN SODIUM-DEXTROSE 2-3 GM-% IV SOLR
INTRAVENOUS | Status: AC
  Filled 2013-10-02: qty 50

## 2013-10-02 MED ORDER — SODIUM CHLORIDE 0.9 % IJ SOLN: 3.0000 mL | INTRAMUSCULAR | Status: DC | PRN

## 2013-10-02 MED ORDER — KETOROLAC TROMETHAMINE 30 MG/ML IJ SOLN
INTRAMUSCULAR | Status: AC
  Filled 2013-10-02: qty 1

## 2013-10-02 MED ORDER — ROCURONIUM BROMIDE 100 MG/10ML IV SOLN
INTRAVENOUS | Status: DC | PRN
  Administered 2013-10-02: 10 mg via INTRAVENOUS
  Administered 2013-10-02: 30 mg via INTRAVENOUS
  Administered 2013-10-02: 10 mg via INTRAVENOUS
  Administered 2013-10-02: 50 mg via INTRAVENOUS

## 2013-10-02 MED ORDER — ACETAMINOPHEN 10 MG/ML IV SOLN
INTRAVENOUS | Status: AC
  Administered 2013-10-02: 1000 mg via INTRAVENOUS
  Filled 2013-10-02: qty 100

## 2013-10-02 MED ORDER — EPHEDRINE SULFATE 50 MG/ML IJ SOLN
INTRAMUSCULAR | Status: DC | PRN
  Administered 2013-10-02: 5 mg via INTRAVENOUS
  Administered 2013-10-02: 10 mg via INTRAVENOUS

## 2013-10-02 MED ORDER — LIDOCAINE-EPINEPHRINE 1 %-1:100000 IJ SOLN
INTRAMUSCULAR | Status: DC | PRN
  Administered 2013-10-02: 10 mL

## 2013-10-02 MED ORDER — ZOLPIDEM TARTRATE 5 MG PO TABS: 5.0000 mg | ORAL_TABLET | Freq: Every evening | ORAL | Status: DC | PRN

## 2013-10-02 MED ORDER — BUPIVACAINE HCL (PF) 0.5 % IJ SOLN
INTRAMUSCULAR | Status: DC | PRN
  Administered 2013-10-02: 10 mL

## 2013-10-02 MED ORDER — GLYCOPYRROLATE 0.2 MG/ML IJ SOLN
INTRAMUSCULAR | Status: DC | PRN
  Administered 2013-10-02: 0.6 mg via INTRAVENOUS

## 2013-10-02 MED ORDER — KETOROLAC TROMETHAMINE 30 MG/ML IJ SOLN: 30.0000 mg | Freq: Once | INTRAMUSCULAR | Status: DC

## 2013-10-02 MED ORDER — NEOSTIGMINE METHYLSULFATE 1 MG/ML IJ SOLN
INTRAMUSCULAR | Status: DC | PRN
Start: 2013-10-02 — End: 2013-10-02
  Administered 2013-10-02: 4 mg via INTRAVENOUS

## 2013-10-02 MED ORDER — CYCLOBENZAPRINE HCL 10 MG PO TABS: 10.0000 mg | ORAL_TABLET | Freq: Three times a day (TID) | ORAL | Status: DC | PRN

## 2013-10-02 MED ORDER — LIDOCAINE HCL (CARDIAC) 20 MG/ML IV SOLN
INTRAVENOUS | Status: DC | PRN
  Administered 2013-10-02: 70 mg via INTRAVENOUS

## 2013-10-02 MED ORDER — ALUM & MAG HYDROXIDE-SIMETH 200-200-20 MG/5ML PO SUSP: 30.0000 mL | Freq: Four times a day (QID) | ORAL | Status: DC | PRN

## 2013-10-02 MED ORDER — ATORVASTATIN CALCIUM 20 MG PO TABS
20.0000 mg | ORAL_TABLET | Freq: Every day | ORAL | Status: DC
  Filled 2013-10-02 (×4): qty 1

## 2013-10-02 MED ORDER — HYDROXYZINE HCL 50 MG PO TABS
50.0000 mg | ORAL_TABLET | ORAL | Status: DC | PRN
  Filled 2013-10-02: qty 1

## 2013-10-02 MED ORDER — BISACODYL 10 MG RE SUPP: 10.0000 mg | Freq: Every day | RECTAL | Status: DC | PRN

## 2013-10-02 MED ORDER — LISINOPRIL 10 MG PO TABS
10.0000 mg | ORAL_TABLET | Freq: Every day | ORAL | Status: DC
  Administered 2013-10-03: 10 mg via ORAL
  Filled 2013-10-02 (×3): qty 1

## 2013-10-02 MED ORDER — HYDROCODONE-ACETAMINOPHEN 5-325 MG PO TABS: 1.0000 | ORAL_TABLET | ORAL | Status: DC | PRN

## 2013-10-02 MED ORDER — KETOROLAC TROMETHAMINE 30 MG/ML IJ SOLN
30.0000 mg | Freq: Four times a day (QID) | INTRAMUSCULAR | Status: AC
  Administered 2013-10-02 – 2013-10-04 (×8): 30 mg via INTRAVENOUS
  Filled 2013-10-02 (×8): qty 1

## 2013-10-02 MED ORDER — ACETAMINOPHEN 650 MG RE SUPP: 650.0000 mg | RECTAL | Status: DC | PRN

## 2013-10-02 MED ORDER — HYDROCHLOROTHIAZIDE 25 MG PO TABS
25.0000 mg | ORAL_TABLET | Freq: Every day | ORAL | Status: DC
  Administered 2013-10-03: 25 mg via ORAL
  Filled 2013-10-02 (×3): qty 1

## 2013-10-02 MED ORDER — SODIUM CHLORIDE 0.9 % IV SOLN: 250.0000 mL | INTRAVENOUS | Status: DC

## 2013-10-02 MED ORDER — MIDAZOLAM HCL 5 MG/5ML IJ SOLN
INTRAMUSCULAR | Status: DC | PRN
  Administered 2013-10-02: 2 mg via INTRAVENOUS

## 2013-10-02 MED ORDER — AMLODIPINE BESYLATE 10 MG PO TABS
10.0000 mg | ORAL_TABLET | Freq: Every day | ORAL | Status: DC
  Administered 2013-10-03: 10 mg via ORAL
  Filled 2013-10-02 (×3): qty 1

## 2013-10-02 MED ORDER — LACTATED RINGERS IV SOLN
INTRAVENOUS | Status: DC | PRN
  Administered 2013-10-02 (×2): via INTRAVENOUS

## 2013-10-02 MED ORDER — SODIUM CHLORIDE 0.9 % IV SOLN
INTRAVENOUS | Status: DC | PRN
  Administered 2013-10-02: 12:00:00 via INTRAVENOUS

## 2013-10-02 MED ORDER — 0.9 % SODIUM CHLORIDE (POUR BTL) OPTIME
TOPICAL | Status: DC | PRN
  Administered 2013-10-02 (×3): 1000 mL

## 2013-10-02 MED ORDER — PROPOFOL 10 MG/ML IV BOLUS
INTRAVENOUS | Status: DC | PRN
  Administered 2013-10-02: 200 mg via INTRAVENOUS

## 2013-10-02 MED ORDER — SODIUM CHLORIDE 0.9 % IV SOLN
10.0000 mg | INTRAVENOUS | Status: DC | PRN
  Administered 2013-10-02: 10 ug/min via INTRAVENOUS

## 2013-10-02 MED ORDER — ACETAMINOPHEN 325 MG PO TABS: 650.0000 mg | ORAL_TABLET | ORAL | Status: DC | PRN

## 2013-10-02 MED ORDER — PHENOL 1.4 % MT LIQD: 1.0000 | OROMUCOSAL | Status: DC | PRN

## 2013-10-02 MED ORDER — THROMBIN 20000 UNITS EX SOLR
CUTANEOUS | Status: DC | PRN
  Administered 2013-10-02 (×2): via TOPICAL

## 2013-10-02 MED ORDER — HYDROMORPHONE HCL PF 1 MG/ML IJ SOLN
0.2500 mg | INTRAMUSCULAR | Status: DC | PRN
  Administered 2013-10-02 (×2): 0.5 mg via INTRAVENOUS

## 2013-10-02 MED ORDER — ALPRAZOLAM 0.25 MG PO TABS: 0.2500 mg | ORAL_TABLET | Freq: Two times a day (BID) | ORAL | Status: DC | PRN

## 2013-10-02 MED ORDER — THROMBIN 5000 UNITS EX SOLR
OROMUCOSAL | Status: DC | PRN
  Administered 2013-10-02: 10:00:00 via TOPICAL

## 2013-10-02 MED ORDER — MORPHINE SULFATE 4 MG/ML IJ SOLN: 4.0000 mg | INTRAMUSCULAR | Status: DC | PRN

## 2013-10-02 MED ORDER — ONDANSETRON 8 MG/NS 50 ML IVPB
8.0000 mg | Freq: Four times a day (QID) | INTRAVENOUS | Status: DC | PRN
  Filled 2013-10-02: qty 8

## 2013-10-02 MED ORDER — PHENYLEPHRINE HCL 10 MG/ML IJ SOLN
INTRAMUSCULAR | Status: DC | PRN
  Administered 2013-10-02 (×3): 80 ug via INTRAVENOUS

## 2013-10-02 MED ORDER — ONDANSETRON HCL 4 MG/2ML IJ SOLN: 4.0000 mg | Freq: Four times a day (QID) | INTRAMUSCULAR | Status: DC | PRN

## 2013-10-02 MED ORDER — OXYCODONE-ACETAMINOPHEN 5-325 MG PO TABS
1.0000 | ORAL_TABLET | ORAL | Status: DC | PRN
  Administered 2013-10-04: 1 via ORAL
  Filled 2013-10-02: qty 1

## 2013-10-02 MED ORDER — DEXTROSE-NACL 5-0.45 % IV SOLN: INTRAVENOUS | Status: DC

## 2013-10-02 MED ORDER — ONDANSETRON HCL 4 MG/2ML IJ SOLN: 4.0000 mg | Freq: Once | INTRAMUSCULAR | Status: DC | PRN

## 2013-10-02 MED ORDER — VECURONIUM BROMIDE 10 MG IV SOLR
INTRAVENOUS | Status: DC | PRN
  Administered 2013-10-02: 2 mg via INTRAVENOUS

## 2013-10-02 MED ORDER — HYDROMORPHONE HCL PF 1 MG/ML IJ SOLN
INTRAMUSCULAR | Status: AC
  Filled 2013-10-02: qty 1

## 2013-10-02 MED ORDER — FENTANYL CITRATE 0.05 MG/ML IJ SOLN
INTRAMUSCULAR | Status: DC | PRN
  Administered 2013-10-02 (×3): 50 ug via INTRAVENOUS
  Administered 2013-10-02: 75 ug via INTRAVENOUS
  Administered 2013-10-02: 125 ug via INTRAVENOUS

## 2013-10-02 MED ORDER — ALBUMIN HUMAN 5 % IV SOLN
INTRAVENOUS | Status: DC | PRN
  Administered 2013-10-02: 10:00:00 via INTRAVENOUS

## 2013-10-02 MED ORDER — PANTOPRAZOLE SODIUM 40 MG PO TBEC
40.0000 mg | DELAYED_RELEASE_TABLET | Freq: Every day | ORAL | Status: DC
  Administered 2013-10-03: 40 mg via ORAL
  Filled 2013-10-02 (×2): qty 1

## 2013-10-02 SURGICAL SUPPLY — 93 items
ADH SKN CLS APL DERMABOND .7 (GAUZE/BANDAGES/DRESSINGS) ×2
ADH SKN CLS LQ APL DERMABOND (GAUZE/BANDAGES/DRESSINGS) ×3
APL SKNCLS STERI-STRIP NONHPOA (GAUZE/BANDAGES/DRESSINGS)
BAG DECANTER FOR FLEXI CONT (MISCELLANEOUS) ×2 IMPLANT
BENZOIN TINCTURE PRP APPL 2/3 (GAUZE/BANDAGES/DRESSINGS) IMPLANT
BLADE SURG ROTATE 9660 (MISCELLANEOUS) IMPLANT
BRUSH SCRUB EZ PLAIN DRY (MISCELLANEOUS) ×2 IMPLANT
BUR ACRON 5.0MM COATED (BURR) ×2 IMPLANT
BUR MATCHSTICK NEURO 3.0 LAGG (BURR) ×2 IMPLANT
CANISTER SUCT 3000ML (MISCELLANEOUS) ×2 IMPLANT
CAP LCK SPNE (Orthopedic Implant) ×6 IMPLANT
CAP LOCK SPINE RADIUS (Orthopedic Implant) IMPLANT
CAP LOCKING (Orthopedic Implant) ×12 IMPLANT
CONT SPEC 4OZ CLIKSEAL STRL BL (MISCELLANEOUS) ×4 IMPLANT
COVER BACK TABLE 24X17X13 BIG (DRAPES) IMPLANT
COVER TABLE BACK 60X90 (DRAPES) ×2 IMPLANT
DERMABOND ADHESIVE PROPEN (GAUZE/BANDAGES/DRESSINGS) ×3
DERMABOND ADVANCED (GAUZE/BANDAGES/DRESSINGS) ×2
DERMABOND ADVANCED .7 DNX12 (GAUZE/BANDAGES/DRESSINGS) ×2 IMPLANT
DERMABOND ADVANCED .7 DNX6 (GAUZE/BANDAGES/DRESSINGS) IMPLANT
DRAPE C-ARM 42X72 X-RAY (DRAPES) ×4 IMPLANT
DRAPE LAPAROTOMY 100X72X124 (DRAPES) ×2 IMPLANT
DRAPE POUCH INSTRU U-SHP 10X18 (DRAPES) ×2 IMPLANT
DRAPE PROXIMA HALF (DRAPES) IMPLANT
DRSG EMULSION OIL 3X3 NADH (GAUZE/BANDAGES/DRESSINGS) IMPLANT
ELECT REM PT RETURN 9FT ADLT (ELECTROSURGICAL) ×2
ELECTRODE REM PT RTRN 9FT ADLT (ELECTROSURGICAL) ×1 IMPLANT
EVACUATOR 1/8 PVC DRAIN (DRAIN) ×1 IMPLANT
GAUZE SPONGE 4X4 16PLY XRAY LF (GAUZE/BANDAGES/DRESSINGS) ×2 IMPLANT
GLOVE BIO SURGEON STRL SZ8.5 (GLOVE) ×1 IMPLANT
GLOVE BIOGEL PI IND STRL 8 (GLOVE) ×2 IMPLANT
GLOVE BIOGEL PI INDICATOR 8 (GLOVE) ×5
GLOVE ECLIPSE 7.5 STRL STRAW (GLOVE) ×8 IMPLANT
GLOVE ECLIPSE 8.0 STRL XLNG CF (GLOVE) ×1 IMPLANT
GLOVE EXAM NITRILE LRG STRL (GLOVE) ×2 IMPLANT
GLOVE EXAM NITRILE MD LF STRL (GLOVE) IMPLANT
GLOVE EXAM NITRILE XL STR (GLOVE) IMPLANT
GLOVE EXAM NITRILE XS STR PU (GLOVE) IMPLANT
GLOVE INDICATOR 7.5 STRL GRN (GLOVE) ×1 IMPLANT
GLOVE SS BIOGEL STRL SZ 8 (GLOVE) IMPLANT
GLOVE SUPERSENSE BIOGEL SZ 8 (GLOVE) ×1
GOWN BRE IMP SLV AUR LG STRL (GOWN DISPOSABLE) IMPLANT
GOWN BRE IMP SLV AUR XL STRL (GOWN DISPOSABLE) ×6 IMPLANT
GOWN STRL REIN 2XL LVL4 (GOWN DISPOSABLE) ×2 IMPLANT
KIT BASIN OR (CUSTOM PROCEDURE TRAY) ×3 IMPLANT
KIT INFUSE MEDIUM (Orthopedic Implant) ×1 IMPLANT
KIT ROOM TURNOVER OR (KITS) ×2 IMPLANT
MILL MEDIUM DISP (BLADE) ×2 IMPLANT
NDL 18GX1X1/2 (RX/OR ONLY) (NEEDLE) ×1 IMPLANT
NDL SPNL 18GX3.5 QUINCKE PK (NEEDLE) ×1 IMPLANT
NDL SPNL 22GX3.5 QUINCKE BK (NEEDLE) ×1 IMPLANT
NEEDLE 18GX1X1/2 (RX/OR ONLY) (NEEDLE) ×2 IMPLANT
NEEDLE BIOPSY JAMSHIDI 10X5 XI (NEEDLE) ×1 IMPLANT
NEEDLE BONE MARROW 8GAX6 (NEEDLE) IMPLANT
NEEDLE SPNL 18GX3.5 QUINCKE PK (NEEDLE) ×4 IMPLANT
NEEDLE SPNL 22GX3.5 QUINCKE BK (NEEDLE) ×2 IMPLANT
NS IRRIG 1000ML POUR BTL (IV SOLUTION) ×2 IMPLANT
PACK LAMINECTOMY NEURO (CUSTOM PROCEDURE TRAY) ×2 IMPLANT
PAD ARMBOARD 7.5X6 YLW CONV (MISCELLANEOUS) ×6 IMPLANT
PATTIES SURGICAL .5 X.5 (GAUZE/BANDAGES/DRESSINGS) IMPLANT
PATTIES SURGICAL .5 X1 (DISPOSABLE) IMPLANT
PATTIES SURGICAL .75X.75 (GAUZE/BANDAGES/DRESSINGS) ×1 IMPLANT
PATTIES SURGICAL 1X1 (DISPOSABLE) IMPLANT
PEEK PLIF AVS 10X25X4 (Peek) ×2 IMPLANT
PEEK PLIF AVS 9X25X4 (Peek) ×2 IMPLANT
ROD 70MM (Rod) ×4 IMPLANT
ROD SPNL 70X5.5XNS TI RDS (Rod) IMPLANT
SCREW 5.75X50MM (Screw) ×2 IMPLANT
SCREW 6.75X45MM (Screw) ×2 IMPLANT
SCREW 6.75X50MM (Screw) ×2 IMPLANT
SPONGE GAUZE 4X4 12PLY (GAUZE/BANDAGES/DRESSINGS) ×2 IMPLANT
SPONGE LAP 18X18 X RAY DECT (DISPOSABLE) ×1 IMPLANT
SPONGE LAP 4X18 X RAY DECT (DISPOSABLE) IMPLANT
SPONGE NEURO XRAY DETECT 1X3 (DISPOSABLE) ×3 IMPLANT
SPONGE SURGIFOAM ABS GEL 100 (HEMOSTASIS) IMPLANT
STAPLER SKIN PROX WIDE 3.9 (STAPLE) IMPLANT
STRIP BIOACTIVE VITOSS 25X100X (Neuro Prosthesis/Implant) ×2 IMPLANT
STRIP CLOSURE SKIN 1/2X4 (GAUZE/BANDAGES/DRESSINGS) IMPLANT
SUT ETHILON 3 0 FSL (SUTURE) ×1 IMPLANT
SUT PROLENE 6 0 BV (SUTURE) IMPLANT
SUT VIC AB 1 CT1 18XBRD ANBCTR (SUTURE) ×2 IMPLANT
SUT VIC AB 1 CT1 8-18 (SUTURE) ×6
SUT VIC AB 2-0 CP2 18 (SUTURE) ×4 IMPLANT
SYR 20CC LL (SYRINGE) ×3 IMPLANT
SYR 3ML LL SCALE MARK (SYRINGE) ×7 IMPLANT
SYR 5ML LL (SYRINGE) IMPLANT
SYR CONTROL 10ML LL (SYRINGE) ×2 IMPLANT
TAPE CLOTH SURG 4X10 WHT LF (GAUZE/BANDAGES/DRESSINGS) ×1 IMPLANT
TOWEL OR 17X24 6PK STRL BLUE (TOWEL DISPOSABLE) ×2 IMPLANT
TOWEL OR 17X26 10 PK STRL BLUE (TOWEL DISPOSABLE) ×2 IMPLANT
TRAP SPECIMEN MUCOUS 40CC (MISCELLANEOUS) IMPLANT
TRAY FOLEY CATH 14FRSI W/METER (CATHETERS) ×2 IMPLANT
WATER STERILE IRR 1000ML POUR (IV SOLUTION) ×2 IMPLANT

## 2013-10-02 NOTE — Progress Notes (Signed)
Filed Vitals:   10/02/13 1430 10/02/13 1435 10/02/13 1454 10/02/13 1700  BP:   116/83 116/72  Pulse: 61  76 61  Temp:  97.4 F (36.3 C) 97.4 F (36.3 C) 97.8 F (36.6 C)  TempSrc:      Resp: 8  20 18   SpO2: 87%  96% 92%    Patient resting in bed, has been up and ambulating in the halls already. Good pain control, so far has used only Toradol. Some nausea. Dressing clean and dry, moderate drainage into Hemovac drain. Foley to straight drainage.  Plan: Continue to progress through postoperative recovery.  Hewitt Shorts, MD 10/02/2013, 7:36 PM

## 2013-10-02 NOTE — Op Note (Addendum)
10/02/2013  1:59 PM  PATIENT:  Peter [name]  67 y.o. male  PRE-OPERATIVE DIAGNOSIS:  L4-5 Spondylolisthesis, Lumbar stenosis, Lumbar spondylosis, Lumbar degenerative disc disease  POST-OPERATIVE DIAGNOSIS:  L4-5 Spondylolisthesis, Lumbar stenosis, Lumbar spondylosis, Lumbar degenerative disc disease  PROCEDURE:  Procedure(s): Lumbar Three-Four, Lumbar Four-Five Lumbar decompression/posterior lumbar interbody fusion/posterolateral arthrodesis: L3, L4, and L5 decompressive lumbar laminectomy, bilateral L3-4 and L4-5 facetectomy, and foraminotomies for the exiting L3, L4, and L5 nerve roots, with decompression of the central canal, lateral recess, and neural foraminal stenosis for the exiting L3, L4, and L5 nerve roots, with decompression beyond that required for interbody arthrodesis; bilateral L3-4 and L4-5 posterior lumbar interbody arthrodesis with AVS peek interbody implants, Vitoss BA with bone marrow aspirate, and infuse; bilateral L3-L5 posterolateral arthrodesis with radius segmental postreduction patient, Vitoss BA with bone marrow aspirate, locally harvested morcellized autograft, and infuse  SURGEON:  Surgeon(s): Hewitt Shorts, MD Cristi Loron, MD  ASSISTANTS: Tressie Stalker, [name]  ANESTHESIA:   general  EBL:  Total I/O In: 2875 [I.V.:2200; Blood:425; IV Piggyback:250] Out: 1250 [Urine:350; Blood:900]  BLOOD ADMINISTERED:425 CC CELLSAVER  COUNT: Correct per nursing staff  DRAINS: Medium Hemovac drain in the laminectomy defect  DICTATION: Patient was brought to the operating room placed under general endotracheal anesthesia. The patient was turned to prone position, the lumbar region was prepped with Betadine soap and solution and draped in a sterile fashion. The midline was infiltrated with local anesthesia with epinephrine. A localizing x-ray was taken and then a midline incision was made and carried down through the subcutaneous tissue, bipolar cautery and  electrocautery were used to maintain hemostasis. Dissection was carried down to the lumbar fascia. The fascia was incised bilaterally and the paraspinal muscles were dissected with a spinous process and lamina in a subperiosteal fashion. Another x-ray was taken for localization and the L3-4 and L4-5 levels were localized. Dissection was then carried out laterally over the facet complexes and the transverse processes of L3, L4, and L5 were exposed and decorticated. 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   We began the decompression by performing a inferior L3, complete L4, and superior L5 laminectomy using double-action rongeurs, the high-speed drill, and Kerrison punches. Bone was saved, cleaned of soft tissue, and morcellized and a bone mill, to be later used as locally harvested morcellized autograft. There was marked thickening of the ligamentum flavum was carefully resected. The decompression was carried out laterally including bilateral facetectomy and foraminotomies with decompression of the stenotic compression of the L3, L4, and L5 nerve roots. Once the decompression of the stenotic compression of the thecal sac and exiting nerve roots was completed we proceeded with the posterior lumbar interbody arthrodesis. We did not have translation of L4 relative to L5, as well as the anterolisthesis of L4 relative to L5. The annulus at each level was incised bilaterally and the disc space entered. A thorough discectomy was performed using pituitary rongeurs and curettes. Once the discectomy was completed we began to prepare the endplate surfaces, removing the cartilaginous endplates surface. We then measured the height of the intervertebral disc space. We selected 10 x 25 x 4 AVS peek interbody implants for the L3-4 level, and  9 x 25 x 4 AVS peek interbody implants for the L4-5 level.  The C-arm fluoroscope was then draped and  brought in the field and we identified the pedicle entry points bilaterally at the L3, L4, and L5 levels. Each of the 6 pedicles was probed, we aspirated bone marrow aspirate from the vertebral bodies, this was injected over 2 10 cc strips of Vitoss BA. Then each of the pedicles was examined with the ball probe, good bony surfaces were found and no bony cuts were found. Each of the pedicles was then tapped with a 5.25 mm tap for the 5.75 mm screws and a 6.25 mm tap for the 6.75 mm screws, again examined with the ball probe good threading was found and no bony cuts were found. We then placed 5.75 by 50 millimeter screws bilaterally at the L3 level, 6.75 by 50 millimeter screws bilaterally at the L4 level, and 6.75 by 45 millimeter screws bilaterally at the L5 level.  We then packed the AVS peek interbody implants with Vitoss BA with bone marrow aspirate and infuse, and then placed the first implant at the L4-5 level on the right side, carefully retracting the thecal sac and nerve root medially. We then went back to the left side and packed the midline with additional Vitoss BA with bone marrow aspirate and infuse, and then placed a second implant on the left side again retracting the thecal sac and nerve root medially. Additional Vitoss BA with bone marrow aspirate was packed lateral to the implants.  Then at the L3-4 level, we placed the first implant on the right side, carefully retracting the thecal sac and nerve root medially. We then went back to the left side and packed the midline with additional Vitoss BA with bone marrow aspirate and infuse, and then placed a second implant on the left side, again retracting the thecal sac and nerve root medially. Additional Vitoss BA with bone marrow aspirate was packed lateral to the implants.   We then packed the lateral gutter over the transverse processes and  intertransverse space with locally harvested morcellized autograft, Vitoss BA with bone marrow aspirate, and infuse. We then selected a 70 mm pre-lordosed rods, they were placed within the screw heads and secured with locking caps once all 6 locking caps were placed final tightening was performed against a counter torque. We then selected a variable medium cross-link, it was secured to the rods between the screws at L4 and L5, once was secured to each the rods, central tightening collar was tightened.  The wound had been irrigated multiple times during the procedure with saline solution, good hemostasis was established with a combination of bipolar cautery and Gelfoam with thrombin. Once good hemostasis was confirmed we placed a medium Hemovac in the laminectomy defect. We then proceeded with closure: The paraspinal muscles, deep fascia, and Scarpa's fascia were closed with interrupted undyed 1 Vicryl sutures, the subcutaneous and subcuticular closed with interrupted inverted 2-0 undyed Vicryl sutures. We then cleaned the area immediately around the incision and drain exit site of all of the Betadine prep.  The skin edges were approximated with Dermabond. The drain was sutured in place with a 3-0 nylon suture. We then cleaned the remaining Betadine from the prep more distal to the incision. A dressing of sterile gauze was applied to the incision area and drain exit site.  Following surgery the patient was turned back to the supine position to be reversed and the anesthetic extubated and transferred to the recovery room for further care.    PLAN OF CARE: Admit for overnight observation  PATIENT DISPOSITION:  PACU -  hemodynamically stable.   Delay start of Pharmacological VTE agent (>24hrs) due to surgical blood loss or risk of bleeding:  yes

## 2013-10-02 NOTE — Anesthesia Postprocedure Evaluation (Signed)
  Anesthesia Post-op Note  Patient: Peter [name]  Procedure(s) Performed: Procedure(s) with comments: Lumbar Three-Four, Lumbar Four-Five Lumbar decompression/posterior lumbar interbody fusion/posterolateral arthrodesis (N/A) - POSTERIOR LUMBAR FUSION 2 LEVEL  Patient Location: PACU  Anesthesia Type:General  Level of Consciousness: awake, alert , oriented and patient cooperative  Airway and Oxygen Therapy: Patient Spontanous Breathing  Post-op Pain: mild  Post-op Assessment: Post-op Vital signs reviewed, Patient's Cardiovascular Status Stable, Respiratory Function Stable, Patent Airway, No signs of Nausea or vomiting and Pain level controlled  Post-op Vital Signs: stable  Complications: No apparent anesthesia complications

## 2013-10-02 NOTE — H&P (Signed)
Subjective: Patient is a 67 y.o. male who is admitted for treatment of neurogenic claudication secondary to multilevel multifactorial lumbar stenosis, contributed to by lumbar spondylosis, lumbar degenerative disc disease, a grade 1-2 dynamic degenerative spondylolisthesis of L4 on L5, lumbar disc bulging, and lumbar synovial cysts. Patient's been treated extensively with nonsurgical measures including Celebrex and epidural steroid injections. MRI scan shows progressive degenerative changes worst at L4-5, next also at L3-4, with more mild degenerative changes seen at L1-2, L2-3, and L5-S1. Patient admitted now for an L3-4 and L4-5 lumbar decompression including laminectomy, facetectomy, and foraminotomy with decompression of the central canal, lateral recesses, and neural foramina, a 2 level L3-4 and L4-5 posterior lumbar interbody arthrodesis with interbody implants and bone graft, and a bilateral L3-L5 posterior lateral arthrodesis with posterior instrumentation and bone graft.   Patient Active Problem List   Diagnosis Date Noted  . Lumbosacral spinal stenosis 06/09/2013  . Lumbar spondylosis with myelopathy 06/09/2013  . Tear film insufficiency, unspecified 06/05/2013  . Thoracic or lumbosacral neuritis or radiculitis, unspecified 06/05/2013  . Inflammation of shoulder joint 10/09/2011  . History of repair of left rotator cuff 10/09/2011  . Need for diphtheria-tetanus-pertussis (Tdap) vaccine, adult/adolescent 04/13/2011  . Chest pain   . Dizziness   . Physical exam, annual 03/26/2011  . DEGENERATIVE JOINT DISEASE 06/21/2010  . PSEUDOGOUT 06/20/2010  . HIP PAIN 06/20/2010  . HYPERCHOLESTEROLEMIA, BORDERLINE 07/19/2008  . TINNITUS, CHRONIC 07/19/2008  . RHEUMATIC FEVER 07/19/2008  . ALLERGIC RHINITIS 07/19/2008  . GERD 07/19/2008  . SPONDYLOSIS 07/19/2008  . COLONIC POLYPS, HX OF 07/19/2008  . MELANOMA 01/11/2008  . HYPERTENSION 01/11/2008   Past Medical History  Diagnosis Date  .  Allergic rhinitis   . Tinnitus     chronic  . Hypertension   . History of rheumatic fever   . Hypercholesteremia     borderline  . GERD (gastroesophageal reflux disease)   . History of colonic polyps   . DJD (degenerative joint disease)   . History of pseudogout   . Spondylosis   . Family history of Alzheimer's disease   . History of melanoma   . Chest pain     Nuclear 2005, normal / coronary CTA 2008, slight mixed plaque, coronary calcium score 0.65, ejection fraction 55%, echo, March, 2008  . Ejection fraction     EF 55%, echo, March, 2008.  . Dizziness     Dizziness with question of presyncope April 08, 2011  . Cancer     Melanoma   . Lumbar spondylosis with myelopathy 06/09/2013  . Complication of anesthesia     itching-not sure if oxycodone or anesthesia,and shakes  . Anxiety   . Heart murmur     67 yrs old rheumatic fever   . H/O hiatal hernia     Past Surgical History  Procedure Laterality Date  . Melanoma excision with sentinel lymph node biopsy  8/02  . Cervical discectomy  02    anterior with patellar allograft and plating  . Anterior cervical decomp/discectomy fusion    . Rotator cuff repair Left     08/13/11  08/27/2011    Prescriptions prior to admission  Medication Sig Dispense Refill  . ALPRAZolam (XANAX) 0.5 MG tablet Take 1/2 to 1 tablet by mouth three times daily as needed for anxiety  90 tablet  5  . amLODipine (NORVASC) 10 MG tablet Take 10 mg by mouth daily.      . celecoxib (CELEBREX) 200 MG capsule Take 200 mg by  mouth 2 (two) times daily.      . colchicine 0.6 MG tablet Take 0.6 mg by mouth 2 (two) times daily.       . cycloSPORINE (RESTASIS) 0.05 % ophthalmic emulsion Place 1 drop into both eyes 2 (two) times daily.      Marland Kitchen gabapentin (NEURONTIN) 300 MG capsule Take 300 mg by mouth 3 (three) times daily.      . hydrochlorothiazide (HYDRODIURIL) 25 MG tablet TAKE 1 TABLET DAILY  90 tablet  2  . pantoprazole (PROTONIX) 40 MG tablet Take 40 mg by  mouth daily.      . potassium chloride SA (K-DUR,KLOR-CON) 20 MEQ tablet Take 1 tablet (20 mEq total) by mouth daily.  90 tablet  4  . quinapril (ACCUPRIL) 10 MG tablet Take 10 mg by mouth daily.      . rosuvastatin (CRESTOR) 10 MG tablet Take 1 tablet (10 mg total) by mouth at bedtime.  90 tablet  4   Allergies  Allergen Reactions  . Gentamycin [Gentamicin]   . Neosporin [Neomycin-Bacitracin Zn-Polymyx]   . Bacitracin   . Moxifloxacin Other (See Comments)    REACTION: pt had severe red rash- he thought from Avelox Rx....  . Triamcinolone Acetonide Other (See Comments)    Unknown    . Other Other (See Comments)    Redness  bandaide    History  Substance Use Topics  . Smoking status: Never Smoker   . Smokeless tobacco: Never Used  . Alcohol Use: 0.0 oz/week    2-4 Glasses of wine per week     Comment: 3 x a week    Family History  Problem Relation Age of Onset  . Alzheimer's disease Father   . Alzheimer's disease Mother      Review of Systems A comprehensive review of systems was negative.  Objective: Vital signs in last 24 hours: Temp:  [97.8 F (36.6 C)] 97.8 F (36.6 C) (12/08 0634) Pulse Rate:  [64] 64 (12/08 0634) Resp:  [18] 18 (12/08 0634) BP: (135)/(94) 135/94 mmHg (12/08 0634) SpO2:  [97 %] 97 % (12/08 0634)  EXAM: Patient is a well-developed well-nourished white male in no acute distress. Lungs are clear to auscultation , the patient has symmetrical respiratory excursion. Heart has a regular rate and rhythm normal S1 and S2 no murmur.   Abdomen is soft nontender nondistended bowel sounds are present. Extremity examination shows no clubbing cyanosis or edema. Musculoskeletal examination shows no tenderness to palpation over the lumbar spinous processes or paralumbar musculature. He is able to flex beyond 90 able to extend beyond 10. Straight leg raising is negative bilaterally. Motor examination shows 5 over 5 strength in the lower extremities including the  iliopsoas quadriceps dorsiflexor extensor hallicus  longus and plantar flexor bilaterally. Sensation is intact to pinprick in the distal lower extremities. Reflexes are symmetrical bilaterally. No pathologic reflexes are present. Patient has a normal gait and stance.   Data Review:CBC    Component Value Date/Time   WBC 7.7 09/22/2013 1146   RBC 4.97 09/22/2013 1146   HGB 15.9 09/22/2013 1146   HCT 45.8 09/22/2013 1146   PLT 256 09/22/2013 1146   MCV 92.2 09/22/2013 1146   MCH 32.0 09/22/2013 1146   MCHC 34.7 09/22/2013 1146   RDW 12.1 09/22/2013 1146   LYMPHSABS 1.7 10/09/2011 1140   MONOABS 1.2* 10/09/2011 1140   EOSABS 0.2 10/09/2011 1140   BASOSABS 0.1 10/09/2011 1140  BMET    Component Value Date/Time   NA 139 09/22/2013 1146   K 5.4* 09/22/2013 1146   CL 103 09/22/2013 1146   CO2 30 09/22/2013 1146   GLUCOSE 98 09/22/2013 1146   BUN 19 09/22/2013 1146   CREATININE 0.93 09/22/2013 1146   CREATININE 0.97 10/09/2011 1140   CALCIUM 9.9 09/22/2013 1146   GFRNONAA 86* 09/22/2013 1146   GFRAA >90 09/22/2013 1146     Assessment/Plan: Patient with worsening neurogenic claudication with worsening multilevel, multifactorial lumbar stenosis due to extensive degenerative change lumbar spine. He is admitted for a 2 level L3-4 and L4-5 lumbar decompression and arthrodesis.  I've discussed with the patient the nature of his condition, the nature the surgical procedure, the typical length of surgery, hospital stay, and overall recuperation, the limitations postoperatively, and risks of surgery. I discussed risks including risks of infection, bleeding, possibly need for transfusion, the risk of nerve root dysfunction with pain, weakness, numbness, or paresthesias, the risk of dural tear and CSF leakage and possible need for further surgery, the risk of failure of the arthrodesis and possibly for further surgery, the risk of anesthetic complications including  myocardial infarction, stroke, pneumonia, and death. We discussed the need for postoperative immobilization in a lumbar brace. Understanding all this the patient does wish to proceed with surgery and is admitted for such.     Hewitt Shorts, MD 10/02/2013 7:08 AM

## 2013-10-02 NOTE — Transfer of Care (Signed)
Immediate Anesthesia Transfer of Care Note  Patient: Peter [name]  Procedure(s) Performed: Procedure(s) with comments: Lumbar Three-Four, Lumbar Four-Five Lumbar decompression/posterior lumbar interbody fusion/posterolateral arthrodesis (N/A) - POSTERIOR LUMBAR FUSION 2 LEVEL  Patient Location: PACU  Anesthesia Type:General  Level of Consciousness: awake, alert  and oriented  Airway & Oxygen Therapy: Patient Spontanous Breathing and Patient connected to face mask oxygen  Post-op Assessment: Report given to PACU RN, Post -op Vital signs reviewed and stable and Patient moving all extremities X 4  Post vital signs: Reviewed and stable  Complications: No apparent anesthesia complications

## 2013-10-02 NOTE — Anesthesia Procedure Notes (Signed)
Procedure Name: Intubation Date/Time: 10/02/2013 7:29 AM Performed by: Quentin Ore Pre-anesthesia Checklist: Patient identified, Emergency Drugs available, Suction available, Patient being monitored and Timeout performed Patient Re-evaluated:Patient Re-evaluated prior to inductionOxygen Delivery Method: Circle system utilized Preoxygenation: Pre-oxygenation with 100% oxygen Intubation Type: IV induction Ventilation: Mask ventilation without difficulty Laryngoscope Size: Mac and 3 Grade View: Grade I Tube type: Oral Tube size: 7.5 mm Number of attempts: 1 Airway Equipment and Method: Stylet and LTA kit utilized Placement Confirmation: ETT inserted through vocal cords under direct vision,  positive ETCO2 and breath sounds checked- equal and bilateral Secured at: 22 cm Tube secured with: Tape Dental Injury: Teeth and Oropharynx as per pre-operative assessment

## 2013-10-02 NOTE — Preoperative (Signed)
Beta Blockers   Reason not to administer Beta Blockers:Not Applicable 

## 2013-10-02 NOTE — Anesthesia Preprocedure Evaluation (Addendum)
Anesthesia Evaluation  Patient identified by MRN, date of birth, ID band Patient awake    Reviewed: Allergy & Precautions, H&P , NPO status , Patient's Chart, lab work & pertinent test results  Airway Mallampati: I      Dental  (+) Teeth Intact   Pulmonary          Cardiovascular hypertension, Pt. on medications + Valvular Problems/Murmurs     Neuro/Psych    GI/Hepatic hiatal hernia, GERD-  Medicated and Controlled,  Endo/Other    Renal/GU      Musculoskeletal   Abdominal   Peds  Hematology   Anesthesia Other Findings   Reproductive/Obstetrics                         Anesthesia Physical Anesthesia Plan  ASA: II  Anesthesia Plan: General   Post-op Pain Management:    Induction: Intravenous  Airway Management Planned: Oral ETT  Additional Equipment:   Intra-op Plan:   Post-operative Plan: Extubation in OR  Informed Consent: I have reviewed the patients History and Physical, chart, labs and discussed the procedure including the risks, benefits and alternatives for the proposed anesthesia with the patient or authorized representative who has indicated his/her understanding and acceptance.   Dental advisory given  Plan Discussed with: CRNA and Anesthesiologist  Anesthesia Plan Comments:         Anesthesia Quick Evaluation

## 2013-10-03 NOTE — Progress Notes (Signed)
Filed Vitals:   10/02/13 1454 10/02/13 1700 10/02/13 2000 10/03/13 0408  BP: 116/83 116/72 124/74 119/63  Pulse: 76 61 64 79  Temp: 97.4 F (36.3 C) 97.8 F (36.6 C) 97.4 F (36.3 C) 98.9 F (37.2 C)  TempSrc:   Oral Oral  Resp: 20 18 20 18   SpO2: 96% 92% 95% 98%    Patient continues to do well. Has been up and out of bed to the bathroom. Foley DC'd at 0600, no void yet. About 100 cc of output and Hemovac overnight, we'll leave in for now. Dressing clean and dry.  Plan: Encouraged to ambulate. Nursing staff to monitor voiding function.  Hewitt Shorts, MD 10/03/2013, 8:29 AM

## 2013-10-03 NOTE — Progress Notes (Signed)
Filed Vitals:   10/02/13 2000 10/03/13 0408 10/03/13 0836 10/03/13 1319  BP: 124/74 119/63 138/96 128/71  Pulse: 64 79 77 90  Temp:  98.9 F (37.2 C) 98.9 F (37.2 C) 98.4 F (36.9 C)  TempSrc: Oral Oral Oral   Resp: 20 18 20 18   SpO2: 95% 98% 93% 91%    Patient has been up and ambulating. Voiding well. Drainage into Hemovac has decreased, and Hemovac drain removed, dressing DC'd, wound clean and dry, wound left open to air. Mild pressure changes seen on the chin, will continue to monitor.  Plan: Continued to progress to postoperative recovery.  Hewitt Shorts, MD 10/03/2013, 6:44 PM

## 2013-10-03 NOTE — Plan of Care (Signed)
Problem: Consults Goal: Diagnosis - Spinal Surgery Outcome: Completed/Met Date Met:  10/03/13 Thoraco/Lumbar Spine Fusion     

## 2013-10-04 MED ORDER — OXYCODONE-ACETAMINOPHEN 5-325 MG PO TABS: 0.5000 | ORAL_TABLET | ORAL | Status: DC | PRN

## 2013-10-04 MED FILL — Sodium Chloride IV Soln 0.9%: INTRAVENOUS | Qty: 1000 | Status: AC

## 2013-10-04 MED FILL — Heparin Sodium (Porcine) Inj 1000 Unit/ML: INTRAMUSCULAR | Qty: 30 | Status: AC

## 2013-10-04 NOTE — Progress Notes (Signed)
I spoke with Maralyn Sago, who prepared patient for surgery. She states she remembers the patient removing his ring, handing it to his spouse and asking where she was putting it.  His spouse stated in her wallet ( or may have been his wallet).  Devonia in Neuro OR notified and she will inform Dr. Newell Coral so he can inform patient.

## 2013-10-04 NOTE — Progress Notes (Signed)
  Pt. Alert and oriented,follows simple instructions, denies pain. Incision area without swelling, redness or S/S of infection. Voiding adequate clear yellow urine. Moving all extremities well and vitals stable and documented. Patient discharged home with spouse. Lumbar surgery notes instructions given to patient and family member for home safety and precautions. Pt. and family stated understanding of instructions given.  

## 2013-10-04 NOTE — Progress Notes (Signed)
Patient's spouse Eunice Blase) was called about patient's ring, and patient's spouse stated she found the ring in her wallet. Dr. Newell Coral and Layla Barter were present during the conversation via telephone. Marin Roberts RN

## 2013-10-04 NOTE — Discharge Summary (Signed)
Physician Discharge Summary  Patient ID: RADAMES MEJORADO MRN: [id number] DOB/AGE: [birth year] 67 y.o.  Admit date: 10/02/2013 Discharge date: 10/04/2013  Admission Diagnoses:  L4-5 Spondylolisthesis, Lumbar stenosis, Lumbar spondylosis, Lumbar degenerative disc disease  Discharge Diagnoses:  L4-5 Spondylolisthesis, Lumbar stenosis, Lumbar spondylosis, Lumbar degenerative disc disease  Active Problems:   Lumbar stenosis with neurogenic claudication  Discharged Condition: good  Hospital Course: Patient was admitted, underwent an L3-4 and L4-5 lumbar decompression, PLIF, and PLA. Postoperatively he has done well. He is up and ambulate actively in the halls. He is voiding well following discontinuation of his Foley catheter. His dressing was removed, and his wound is healing well. He is to be discharged home. He has been given instructions regarding wound care and activities. He is to return for followup with me in 2 weeks.  Discharge Exam: Blood pressure 108/76, pulse 90, temperature 99 F (37.2 C), temperature source Oral, resp. rate 20, SpO2 96.00%.  Disposition: Home   Future Appointments Provider Department Dept Phone   06/11/2014 9:00 AM York Spaniel, MD [facility] [phone]       Medication List         ALPRAZolam 0.5 MG tablet  Commonly known as:  XANAX  Take 1/2 to 1 tablet by mouth three times daily as needed for anxiety     amLODipine 10 MG tablet  Commonly known as:  NORVASC  Take 10 mg by mouth daily.     celecoxib 200 MG capsule  Commonly known as:  CELEBREX  Take 200 mg by mouth 2 (two) times daily.     colchicine 0.6 MG tablet  Take 0.6 mg by mouth 2 (two) times daily.     gabapentin 300 MG capsule  Commonly known as:  NEURONTIN  Take 300 mg by mouth 3 (three) times daily.     hydrochlorothiazide 25 MG tablet  Commonly known as:  HYDRODIURIL  TAKE 1 TABLET DAILY     oxyCODONE-acetaminophen 5-325 MG per tablet  Commonly known as:   PERCOCET/ROXICET  Take 0.5-1 tablets by mouth every 4 (four) hours as needed for moderate pain or severe pain.     pantoprazole 40 MG tablet  Commonly known as:  PROTONIX  Take 40 mg by mouth daily.     potassium chloride SA 20 MEQ tablet  Commonly known as:  K-DUR,KLOR-CON  Take 1 tablet (20 mEq total) by mouth daily.     quinapril 10 MG tablet  Commonly known as:  ACCUPRIL  Take 10 mg by mouth daily.     RESTASIS 0.05 % ophthalmic emulsion  Generic drug:  cycloSPORINE  Place 1 drop into both eyes 2 (two) times daily.     rosuvastatin 10 MG tablet  Commonly known as:  CRESTOR  Take 1 tablet (10 mg total) by mouth at bedtime.         Signed: Hewitt Shorts, MD 10/04/2013, 10:32 AM

## 2013-10-25 ENCOUNTER — Other Ambulatory Visit: Payer: Self-pay | Admitting: Orthopedic Surgery

## 2013-10-25 ENCOUNTER — Encounter (HOSPITAL_BASED_OUTPATIENT_CLINIC_OR_DEPARTMENT_OTHER): Payer: Self-pay | Admitting: *Deleted

## 2013-10-25 NOTE — Progress Notes (Signed)
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

## 2013-10-27 ENCOUNTER — Encounter (HOSPITAL_BASED_OUTPATIENT_CLINIC_OR_DEPARTMENT_OTHER)
Admission: RE | Admit: 2013-10-27 | Discharge: 2013-10-27 | Disposition: A | Discharge status: Home / Self Care | Payer: PRIVATE HEALTH INSURANCE | Source: Ambulatory Visit | Attending: Orthopedic Surgery | Admitting: Orthopedic Surgery

## 2013-10-27 ENCOUNTER — Telehealth: Payer: Self-pay | Admitting: Pulmonary Disease

## 2013-10-27 DIAGNOSIS — Z01818 Encounter for other preprocedural examination: Secondary | ICD-10-CM | POA: Insufficient documentation

## 2013-10-27 LAB — BASIC METABOLIC PANEL
BUN: 15 mg/dL (ref 6–23)
CO2: 28 mEq/L (ref 19–32)
Calcium: 9.5 mg/dL (ref 8.4–10.5)
Chloride: 99 mEq/L (ref 96–112)
Creatinine, Ser: 0.83 mg/dL (ref 0.50–1.35)
GFR calc Af Amer: >90 mL/min (ref >90)
GFR calc non Af Amer: >90 mL/min (ref >90)
Glucose, Bld: 103 mg/dL — ABNORMAL HIGH (ref 70–99)
Potassium: 4.6 mEq/L (ref 3.7–5.3)
Sodium: 139 mEq/L (ref 137–147)

## 2013-10-27 NOTE — Telephone Encounter (Signed)
Received 40 pages from [facility], sent to Dr. Lenna Gilford. 10/27/13/ss.

## 2013-10-30 NOTE — H&P (Signed)
Peter [name] is an 68 y.o. male.   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  He has also had significant stress to the left hand using his Dictaphone on a voice recognition program.  He is having numbness that awakens him at night 7 out of 7 nights a week.  We had a detailed discussion of his symptom pattern.  He is obviously is developing carpal tunnel syndrome.     Past Medical History  Diagnosis Date  . Allergic rhinitis   . Tinnitus     chronic  . Hypertension   . History of rheumatic fever   . Hypercholesteremia     borderline  . GERD (gastroesophageal reflux disease)   . History of colonic polyps   . DJD (degenerative joint disease)   . History of pseudogout   . Spondylosis   . Family history of Alzheimer's disease   . History of melanoma   . Chest pain     Nuclear 2005, normal / coronary CTA 2008, slight mixed plaque, coronary calcium score 0.65, ejection fraction 55%, echo, March, 2008  . Ejection fraction     EF 55%, echo, March, 2008.  . Dizziness     Dizziness with question of presyncope April 08, 2011  . Cancer     Melanoma   . Lumbar spondylosis with myelopathy 06/09/2013  . Complication of anesthesia     itching-not sure if oxycodone or anesthesia,and shakes  . Anxiety   . H/O hiatal hernia   . Heart murmur     68 yrs old rheumatic fever   . Heart murmur 2008    trivial tricusp regurg    Past Surgical History  Procedure Laterality Date  . Melanoma excision with sentinel lymph node biopsy  8/02    bilat ing node bx  . Anterior cervical decomp/discectomy fusion  2003  . Rotator cuff repair Left     08/13/11  08/27/2011  . Lumbar fusion  12/14  . Cervical discectomy      anterior with patellar allograft and plating    Family History  Problem Relation Age of Onset  . Alzheimer's disease  Father   . Alzheimer's disease Mother    Social History:  reports that he has never smoked. He has never used smokeless tobacco. He reports that he drinks alcohol. He reports that he does not use illicit drugs.  Allergies:  Allergies  Allergen Reactions  . Gentamycin [Gentamicin]   . Neosporin [Neomycin-Bacitracin Zn-Polymyx]   . Bacitracin   . Moxifloxacin Other (See Comments)    REACTION: pt had severe red rash- he thought from Avelox Rx....  . Triamcinolone Acetonide Other (See Comments)    Unknown    . Other Other (See Comments)    Redness  bandaide    No prescriptions prior to admission    No results found for this or any previous visit (from the past 48 hour(s)).  No results found.   Pertinent items are noted in HPI.  Height 5' 9.5" (1.765 m), weight 83.915 kg (185 lb).  General appearance: alert Head: Normocephalic, without obvious abnormality Neck: supple, symmetrical, trachea midline Resp: clear to auscultation bilaterally Cardio: regular rate and rhythm GI: normal findings: bowel sounds normal Extremities: On exam in our office today he has a positive Phalen's and Tinel's.  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  Dr. Zebedee Iba completed detailed electrodiagnostic studies.  These revealed moderate bilateral carpal tunnel syndrome.  His amplitudes are 47.7 right and 41.7 left sensory.   Pulses: 2+ and symmetric Skin: mobility and turgor normal Neurologic: Grossly normal    Assessment/Plan Impression: Right CTS and mild Dupuytren's nodule right palm ring finger pretendinous cord with stenosing tenosynovitis at A 1 pulley.  Plan: To the OR for right CTR and resection of Dupuytren's right palm/ring finger.  A release of the ring finger A 1 pulley will be evaluated in the holding area.  The procedure, risks,benefits and post-op course were discussed with the patient at length and they were in agreement with  the plan.  DASNOIT,Bocephus Cali J 10/30/2013, 12:26 PM  H&P documentation: 10/31/2013  -History and Physical Reviewed  -Patient has been re-examined  -No change in the plan of care  Cammie Sickle, MD

## 2013-10-31 ENCOUNTER — Encounter (HOSPITAL_BASED_OUTPATIENT_CLINIC_OR_DEPARTMENT_OTHER): Payer: Self-pay

## 2013-10-31 ENCOUNTER — Encounter (HOSPITAL_BASED_OUTPATIENT_CLINIC_OR_DEPARTMENT_OTHER)
Admission: RE | Disposition: A | Discharge status: Home / Self Care | Payer: Self-pay | Source: Ambulatory Visit | Attending: Orthopedic Surgery

## 2013-10-31 ENCOUNTER — Ambulatory Visit (HOSPITAL_BASED_OUTPATIENT_CLINIC_OR_DEPARTMENT_OTHER)
Admission: RE | Admit: 2013-10-31 | Discharge: 2013-10-31 | Disposition: A | Discharge status: Home / Self Care | Payer: PRIVATE HEALTH INSURANCE | Source: Ambulatory Visit | Attending: Orthopedic Surgery | Admitting: Orthopedic Surgery

## 2013-10-31 ENCOUNTER — Ambulatory Visit (HOSPITAL_BASED_OUTPATIENT_CLINIC_OR_DEPARTMENT_OTHER): Payer: PRIVATE HEALTH INSURANCE | Admitting: Anesthesiology

## 2013-10-31 ENCOUNTER — Encounter (HOSPITAL_BASED_OUTPATIENT_CLINIC_OR_DEPARTMENT_OTHER): Payer: PRIVATE HEALTH INSURANCE | Admitting: Anesthesiology

## 2013-10-31 DIAGNOSIS — Z85820 Personal history of malignant melanoma of skin: Secondary | ICD-10-CM | POA: Insufficient documentation

## 2013-10-31 DIAGNOSIS — G5600 Carpal tunnel syndrome, unspecified upper limb: Secondary | ICD-10-CM | POA: Insufficient documentation

## 2013-10-31 DIAGNOSIS — R011 Cardiac murmur, unspecified: Secondary | ICD-10-CM | POA: Insufficient documentation

## 2013-10-31 DIAGNOSIS — K219 Gastro-esophageal reflux disease without esophagitis: Secondary | ICD-10-CM | POA: Insufficient documentation

## 2013-10-31 DIAGNOSIS — M47817 Spondylosis without myelopathy or radiculopathy, lumbosacral region: Secondary | ICD-10-CM | POA: Insufficient documentation

## 2013-10-31 DIAGNOSIS — M65849 Other synovitis and tenosynovitis, unspecified hand: Secondary | ICD-10-CM

## 2013-10-31 DIAGNOSIS — M65839 Other synovitis and tenosynovitis, unspecified forearm: Secondary | ICD-10-CM | POA: Insufficient documentation

## 2013-10-31 DIAGNOSIS — I10 Essential (primary) hypertension: Secondary | ICD-10-CM | POA: Insufficient documentation

## 2013-10-31 DIAGNOSIS — M720 Palmar fascial fibromatosis [Dupuytren]: Secondary | ICD-10-CM | POA: Insufficient documentation

## 2013-10-31 HISTORY — PX: CARPAL TUNNEL RELEASE: SHX101

## 2013-10-31 LAB — POCT HEMOGLOBIN-HEMACUE: Hemoglobin: 13.8 g/dL (ref 13.0–17.0)

## 2013-10-31 SURGERY — CARPAL TUNNEL RELEASE
Anesthesia: General | Site: Wrist | Laterality: Right

## 2013-10-31 MED ORDER — ONDANSETRON HCL 4 MG/2ML IJ SOLN
INTRAMUSCULAR | Status: DC | PRN
  Administered 2013-10-31: 4 mg via INTRAVENOUS

## 2013-10-31 MED ORDER — LIDOCAINE HCL (CARDIAC) 20 MG/ML IV SOLN
INTRAVENOUS | Status: DC | PRN
  Administered 2013-10-31: 100 mg via INTRAVENOUS

## 2013-10-31 MED ORDER — LIDOCAINE HCL 2 % IJ SOLN
INTRAMUSCULAR | Status: DC | PRN
  Administered 2013-10-31: 3 mL

## 2013-10-31 MED ORDER — OXYCODONE HCL 5 MG/5ML PO SOLN: 5.0000 mg | Freq: Once | ORAL | Status: DC | PRN

## 2013-10-31 MED ORDER — FENTANYL CITRATE 0.05 MG/ML IJ SOLN: 50.0000 ug | INTRAMUSCULAR | Status: DC | PRN

## 2013-10-31 MED ORDER — PROPOFOL 10 MG/ML IV BOLUS
INTRAVENOUS | Status: DC | PRN
  Administered 2013-10-31: 200 mg via INTRAVENOUS

## 2013-10-31 MED ORDER — PROPOFOL INFUSION 10 MG/ML OPTIME
INTRAVENOUS | Status: DC | PRN
  Administered 2013-10-31: 200 ug/kg/min via INTRAVENOUS

## 2013-10-31 MED ORDER — MIDAZOLAM HCL 2 MG/2ML IJ SOLN: 1.0000 mg | INTRAMUSCULAR | Status: DC | PRN

## 2013-10-31 MED ORDER — CHLORHEXIDINE GLUCONATE 4 % EX LIQD: 60.0000 mL | Freq: Once | CUTANEOUS | Status: DC

## 2013-10-31 MED ORDER — MIDAZOLAM HCL 2 MG/2ML IJ SOLN
INTRAMUSCULAR | Status: AC
  Filled 2013-10-31: qty 2

## 2013-10-31 MED ORDER — OXYCODONE HCL 5 MG PO TABS: 5.0000 mg | ORAL_TABLET | Freq: Once | ORAL | Status: DC | PRN

## 2013-10-31 MED ORDER — MIDAZOLAM HCL 5 MG/5ML IJ SOLN: INTRAMUSCULAR | Status: DC | PRN

## 2013-10-31 MED ORDER — FENTANYL CITRATE 0.05 MG/ML IJ SOLN: 50.0000 ug | Freq: Once | INTRAMUSCULAR | Status: DC

## 2013-10-31 MED ORDER — FENTANYL CITRATE 0.05 MG/ML IJ SOLN
INTRAMUSCULAR | Status: AC
  Filled 2013-10-31: qty 4

## 2013-10-31 MED ORDER — OXYCODONE-ACETAMINOPHEN 5-325 MG PO TABS: ORAL_TABLET | ORAL | Status: DC

## 2013-10-31 MED ORDER — HYDROMORPHONE HCL PF 1 MG/ML IJ SOLN: 0.2500 mg | INTRAMUSCULAR | Status: DC | PRN

## 2013-10-31 MED ORDER — LACTATED RINGERS IV SOLN
INTRAVENOUS | Status: DC
  Administered 2013-10-31 (×2): via INTRAVENOUS

## 2013-10-31 MED ORDER — PROMETHAZINE HCL 25 MG/ML IJ SOLN: 6.2500 mg | INTRAMUSCULAR | Status: DC | PRN

## 2013-10-31 SURGICAL SUPPLY — 55 items
BANDAGE ADH SHEER 1  50/CT (GAUZE/BANDAGES/DRESSINGS) IMPLANT
BANDAGE ELASTIC 3 VELCRO ST LF (GAUZE/BANDAGES/DRESSINGS) ×1 IMPLANT
BLADE MINI RND TIP GREEN BEAV (BLADE) ×1 IMPLANT
BLADE SURG 15 STRL LF DISP TIS (BLADE) ×2 IMPLANT
BLADE SURG 15 STRL SS (BLADE) ×3
BNDG CMPR 9X4 STRL LF SNTH (GAUZE/BANDAGES/DRESSINGS) ×2
BNDG COHESIVE 3X5 TAN STRL LF (GAUZE/BANDAGES/DRESSINGS) ×2 IMPLANT
BNDG CONFORM 3 STRL LF (GAUZE/BANDAGES/DRESSINGS) IMPLANT
BNDG ESMARK 4X9 LF (GAUZE/BANDAGES/DRESSINGS) ×3 IMPLANT
BNDG GAUZE ELAST 4 BULKY (GAUZE/BANDAGES/DRESSINGS) ×2 IMPLANT
BRUSH SCRUB EZ PLAIN DRY (MISCELLANEOUS) ×3 IMPLANT
CORDS BIPOLAR (ELECTRODE) ×3 IMPLANT
COVER MAYO STAND STRL (DRAPES) ×3 IMPLANT
COVER TABLE BACK 60X90 (DRAPES) ×3 IMPLANT
CUFF TOURNIQUET SINGLE 18IN (TOURNIQUET CUFF) ×2 IMPLANT
DECANTER SPIKE VIAL GLASS SM (MISCELLANEOUS) IMPLANT
DRAPE EXTREMITY T 121X128X90 (DRAPE) ×3 IMPLANT
DRAPE SURG 17X23 STRL (DRAPES) ×3 IMPLANT
DRSG EMULSION OIL 3X3 NADH (GAUZE/BANDAGES/DRESSINGS) ×1 IMPLANT
GLOVE BIO SURGEON STRL SZ 6.5 (GLOVE) ×2 IMPLANT
GLOVE BIOGEL M STRL SZ7.5 (GLOVE) ×1 IMPLANT
GLOVE BIOGEL PI IND STRL 7.0 (GLOVE) ×2 IMPLANT
GLOVE BIOGEL PI INDICATOR 7.0 (GLOVE) ×2
GLOVE ORTHO TXT STRL SZ7.5 (GLOVE) ×3 IMPLANT
GOWN STRL REUS W/ TWL LRG LVL3 (GOWN DISPOSABLE) ×2 IMPLANT
GOWN STRL REUS W/TWL LRG LVL3 (GOWN DISPOSABLE) ×3
GOWN STRL REUS W/TWL XL LVL3 (GOWN DISPOSABLE) ×4 IMPLANT
LOOP VESSEL MAXI BLUE (MISCELLANEOUS) IMPLANT
NDL HYPO 25X1 1.5 SAFETY (NEEDLE) IMPLANT
NEEDLE 27GAX1X1/2 (NEEDLE) IMPLANT
NEEDLE HYPO 25X1 1.5 SAFETY (NEEDLE) IMPLANT
NS IRRIG 1000ML POUR BTL (IV SOLUTION) ×1 IMPLANT
PACK BASIN DAY SURGERY FS (CUSTOM PROCEDURE TRAY) ×3 IMPLANT
PAD CAST 3X4 CTTN HI CHSV (CAST SUPPLIES) ×2 IMPLANT
PADDING CAST ABS 4INX4YD NS (CAST SUPPLIES) ×1
PADDING CAST ABS COTTON 4X4 ST (CAST SUPPLIES) ×2 IMPLANT
PADDING CAST COTTON 3X4 STRL (CAST SUPPLIES) ×3
SLEEVE SCD COMPRESS KNEE MED (MISCELLANEOUS) ×1 IMPLANT
SPLINT PLASTER CAST XFAST 3X15 (CAST SUPPLIES) ×13 IMPLANT
SPLINT PLASTER XTRA FASTSET 3X (CAST SUPPLIES) ×5
SPONGE GAUZE 4X4 12PLY (GAUZE/BANDAGES/DRESSINGS) ×3 IMPLANT
STOCKINETTE 4X48 STRL (DRAPES) ×3 IMPLANT
STRIP CLOSURE SKIN 1/2X4 (GAUZE/BANDAGES/DRESSINGS) ×3 IMPLANT
SUT ETHILON 5 0 P 3 18 (SUTURE)
SUT NYLON ETHILON 5-0 P-3 1X18 (SUTURE) ×2 IMPLANT
SUT PROLENE 3 0 PS 2 (SUTURE) ×3 IMPLANT
SUT SILK 4 0 PS 2 (SUTURE) ×1 IMPLANT
SUT VIC AB 4-0 P2 18 (SUTURE) IMPLANT
SYR 3ML 23GX1 SAFETY (SYRINGE) IMPLANT
SYR BULB 3OZ (MISCELLANEOUS) ×1 IMPLANT
SYR CONTROL 10ML LL (SYRINGE) ×2 IMPLANT
TOWEL OR 17X24 6PK STRL BLUE (TOWEL DISPOSABLE) ×4 IMPLANT
TOWEL OR NON WOVEN STRL DISP B (DISPOSABLE) ×3 IMPLANT
TRAY DSU PREP LF (CUSTOM PROCEDURE TRAY) ×3 IMPLANT
UNDERPAD 30X30 INCONTINENT (UNDERPADS AND DIAPERS) ×3 IMPLANT

## 2013-10-31 NOTE — Transfer of Care (Signed)
Immediate Anesthesia Transfer of Care Note  Patient: Peter [name]  Procedure(s) Performed: Procedure(s): RIGHT CARPAL TUNNEL RELEASE (Right)  Patient Location: PACU  Anesthesia Type:General  Level of Consciousness: sedated  Airway & Oxygen Therapy: Patient Spontanous Breathing and Patient connected to face mask oxygen  Post-op Assessment: Report given to PACU RN and Post -op Vital signs reviewed and stable  Post vital signs: Reviewed and stable  Complications: No apparent anesthesia complications

## 2013-10-31 NOTE — Anesthesia Preprocedure Evaluation (Signed)
Anesthesia Evaluation  Patient identified by MRN, date of birth, ID band Patient awake    Reviewed: Allergy & Precautions, H&P , NPO status , Patient's Chart, lab work & pertinent test results  Airway Mallampati: I TM Distance: >3 FB Neck ROM: Full    Dental  (+) Teeth Intact   Pulmonary  breath sounds clear to auscultation        Cardiovascular hypertension, Pt. on medications + Valvular Problems/Murmurs Rhythm:Regular Rate:Normal     Neuro/Psych Anxiety Chronic back pain    GI/Hepatic hiatal hernia, GERD-  Medicated and Controlled,  Endo/Other    Renal/GU      Musculoskeletal   Abdominal   Peds  Hematology   Anesthesia Other Findings   Reproductive/Obstetrics                           Anesthesia Physical Anesthesia Plan  ASA: II  Anesthesia Plan: General   Post-op Pain Management:    Induction: Intravenous  Airway Management Planned: LMA  Additional Equipment:   Intra-op Plan:   Post-operative Plan: Extubation in OR  Informed Consent: I have reviewed the patients History and Physical, chart, labs and discussed the procedure including the risks, benefits and alternatives for the proposed anesthesia with the patient or authorized representative who has indicated his/her understanding and acceptance.     Plan Discussed with: CRNA and Surgeon  Anesthesia Plan Comments:         Anesthesia Quick Evaluation

## 2013-10-31 NOTE — Op Note (Signed)
[id number]  

## 2013-10-31 NOTE — Discharge Instructions (Addendum)

## 2013-10-31 NOTE — Anesthesia Postprocedure Evaluation (Signed)
  Anesthesia Post-op Note  Patient: Peter [name]  Procedure(s) Performed: Procedure(s): RIGHT CARPAL TUNNEL RELEASE (Right)  Patient Location: PACU  Anesthesia Type:General  Level of Consciousness: awake and alert   Airway and Oxygen Therapy: Patient Spontanous Breathing  Post-op Pain: none  Post-op Assessment: Post-op Vital signs reviewed, Patient's Cardiovascular Status Stable, Respiratory Function Stable, Patent Airway, No signs of Nausea or vomiting and Pain level controlled  Post-op Vital Signs: Reviewed and stable  Complications: No apparent anesthesia complications

## 2013-10-31 NOTE — Brief Op Note (Signed)
10/31/2013  10:19 AM  PATIENT:  Peter [name]  68 y.o. male  PRE-OPERATIVE DIAGNOSIS:  Carpal tunnel syndrome, right  dupuytrens   POST-OPERATIVE DIAGNOSIS:  Carpal tunnel syndrome, right  dupuytrens   PROCEDURE:  Procedure(s): RIGHT CARPAL TUNNEL RELEASE (Right)  SURGEON:  Surgeon(s) and Role:    * Cammie Sickle., MD - Primary  PHYSICIAN ASSISTANT:   ASSISTANTS: surgical tech   ANESTHESIA:   general  EBL:  Total I/O In: 1000 [I.V.:1000] Out: -   BLOOD ADMINISTERED:none  DRAINS: none   LOCAL MEDICATIONS USED:  XYLOCAINE   SPECIMEN:  No Specimen  DISPOSITION OF SPECIMEN:  N/A  COUNTS:  YES  TOURNIQUET:   Total Tourniquet Time Documented: Upper Arm (Left) - 27 minutes Total: Upper Arm (Left) - 27 minutes   DICTATION: .Other Dictation: Dictation Number [id number]  PLAN OF CARE: Discharge to home after PACU  PATIENT DISPOSITION:  PACU - hemodynamically stable.   Delay start of Pharmacological VTE agent (>24hrs) due to surgical blood loss or risk of bleeding: not applicable

## 2013-11-01 ENCOUNTER — Encounter (HOSPITAL_BASED_OUTPATIENT_CLINIC_OR_DEPARTMENT_OTHER): Payer: Self-pay | Admitting: Orthopedic Surgery

## 2013-11-01 NOTE — Op Note (Signed)
NAME:  Peter [name], Peter [name] NO.:  [id number]  MEDICAL RECORD NO.:  [id number]  LOCATION:                                 FACILITY:  PHYSICIAN:  Peter [name], [name] DATE OF BIRTH:  1946-09-06  DATE OF PROCEDURE:  10/31/2013 DATE OF DISCHARGE:                              OPERATIVE REPORT   PREOPERATIVE DIAGNOSES:  Right carpal tunnel syndrome documented with electrodiagnostic studies in 2013 with failure to respond to nonoperative measures including injections, splinting, and activity modification.  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  Also history of pseudogout.  POSTOPERATIVE DIAGNOSES:  Right carpal tunnel syndrome documented with electrodiagnostic studies in 2013 with failure to respond to nonoperative measures including injections, splinting, and activity modification.  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  Also history of pseudogout.  OPERATION:  Release of right transverse carpal ligament, identification of unusual palmaris profundus muscle, and no evidence of anomalous motor branch.  OPERATING SURGEON:  Peter Mighty. Sammy Cassar, MD.  ASSISTANT:  Surgical technician.  ANESTHESIA:  General sedation with propofol and supplemented by 2% lidocaine palm block and distal forearm field block for postoperative comfort.  SUPERVISING ANESTHESIOLOGIST:  Peter Quail, MD.  INDICATIONS:  Peter [name] is a 68 year old, right-hand dominant, esteemed Radiology colleagues who presented for evaluation several years ago of hand numbness, triggering of his right ring finger, and early Dupuytren's palmar fibromatosis.  Dr.  Alvester [name] I have had a longstanding dialogue regarding management of these issues.  In June 2013, he had electrodiagnostic studies completed by Dr. Edwyna [name] that revealed bilateral carpal tunnel syndrome.  For the past 18 months we have been managing his symptoms with occasional steroid injections, splinting, and activity modification.  Peter [name] has been noted to have pseudogout and a number of other orthopedic predicaments including spinal stenosis and cervical degenerative disk disease.  As we have worked our way through this list of musculoskeletal issues, Peter [name] has prioritize management of his spinal issues initially and while recovering from a lumbar procedure performed by Dr. Sherwood [name], now requested proceeding with release of his right transverse carpal ligament.  Over the years, we have injected his right ring finger flexor sheath 3 times with steroid and at this point in time, he has no evidence of active stenosing tenosynovitis.  He does have a small degree of activation of Dupuytren's palmar fibromatosis involving the pretendinous fibers to his right ring finger and his right thumb index webspace natatory ligaments.  We at one point discussed possible needle epidurotomy or fasciotomy of this cord; however, upon examination in the holding area at this time, this was relatively quiet and we decided as a team not to address it today.  After informed consent, Peter [name] was brought to the operating room anticipating release of the transverse carpal ligament only.  Preoperatively, he was interviewed by Dr. Chriss [name] of Anesthesia.  After  detailed anesthesia informed consent, a propofol sedation was recommended and accepted supplemented by local anesthesia of the palm and forearm.  After informed consent, Peter [name] was brought to the operating room at this time.  PROCEDURE:  Peter [name] was brought to room 2 of the [facility] and placed in supine position upon  the operating table.  Under Dr. Osborne Oman direct supervision, IV sedation with propofol was provided.  The right hand and arm were prepped with Betadine soap and solution, sterilely draped.  A pneumatic tourniquet was applied to the proximal right brachium.  Following a routine surgical time-out, procedure commenced with confirmation of satisfactory sedation.  Peter [name] was essentially anesthetic under propofol, therefore we proceeded with a short incision in line of the ring finger and the palm.  Subcutaneous tissues were carefully divided revealing a very thickened palmar fascia.  This was dissected with a split of the fibers longitudinally revealing a complex muscle anomaly in the mid palm.  Peter [name] had what is probably termed a palmaris profundus with a very significant muscle belly at the distal margin of transverse carpal ligament.  This extended towards the hypothenar musculature.  With great care, the muscle fibers were teased apart with a micro tenotomy scissors and a Penfield 4 elevator looking for anomalous plus motor branches to either the thenar or hypothenar muscles.  Ultimately, the transverse carpal ligament proper was identified.  We then sequentially released the fibers of the transverse carpal ligament along its ulnar border directly off the hook of the hamate, ultimately releasing the transverse carpal ligament from the mid palm proximally into the forearm.  This widely opened the carpal canal.  We did not identify any evidence of pseudogout crystals in the ulnar bursa.  The transverse carpal ligament was thickened substantially.  The volar forearm fascia was released subcutaneously 4 cm off the distal forearm.  The contents of the carpal canal, ulnar bursa, and pronator space were inspected and found to be otherwise normal.  The area of maximum compression appeared to be at the distal margin of the transverse carpal ligament deep to the anomalous  muscle.  Bleeding points were electrocauterized with bipolar current followed by repair of the wound with intradermal 3-0 Prolene.  Before wound closure, the tourniquet was released.  No bleeding issues were encountered. There was immediate capillary refill of the fingers with normal turgor and the thumb revealed normal turgor.  The wound was then dressed with Steri-Strips, sterile gauze, sterile Webril, and a volar plaster splint maintaining the wrist in 15 degrees dorsiflexion.  For aftercare, Peter [name] was provided prescription for oxycodone 5 mg 1 or 2 p.o. q.4-6 hours p.r.n. pain, 30 tablets without refill.     Peter Mighty Kandee Escalante, [name]     RVS/MEDQ  D:  10/31/2013  T:  11/01/2013  Job:  [id number]  cc:   Deborra Medina. Lenna Gilford, MD

## 2013-11-10 ENCOUNTER — Other Ambulatory Visit: Payer: Self-pay | Admitting: Pulmonary Disease

## 2013-11-17 ENCOUNTER — Other Ambulatory Visit: Payer: Self-pay | Admitting: Neurosurgery

## 2013-11-17 DIAGNOSIS — M48061 Spinal stenosis, lumbar region without neurogenic claudication: Secondary | ICD-10-CM

## 2013-11-17 DIAGNOSIS — Z79899 Other long term (current) drug therapy: Secondary | ICD-10-CM

## 2013-11-21 ENCOUNTER — Other Ambulatory Visit: Payer: Self-pay | Admitting: Orthopedic Surgery

## 2013-11-22 ENCOUNTER — Encounter (HOSPITAL_BASED_OUTPATIENT_CLINIC_OR_DEPARTMENT_OTHER): Payer: Self-pay | Admitting: *Deleted

## 2013-11-22 NOTE — Progress Notes (Signed)
Pt was here 10/27/13 for rt ctr-wants same anesthesia-may need istat if [employer]

## 2013-11-27 NOTE — H&P (Signed)
Peter [name] is an 68 y.o. male.   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 He has also had significant stress to the left hand using his Dictaphone on a voice recognition program. He is having numbness that awakens him at night 7 out of 7 nights a week. We had a detailed discussion of his symptom pattern. He is obviously is developing carpal tunnel syndrome.   Past Medical History  Diagnosis Date  . Allergic rhinitis   . Tinnitus     chronic  . Hypertension   . History of rheumatic fever   . Hypercholesteremia     borderline  . GERD (gastroesophageal reflux disease)   . History of colonic polyps   . DJD (degenerative joint disease)   . History of pseudogout   . Spondylosis   . Family history of Alzheimer's disease   . History of melanoma   . Chest pain     Nuclear 2005, normal / coronary CTA 2008, slight mixed plaque, coronary calcium score 0.65, ejection fraction 55%, echo, March, 2008  . Ejection fraction     EF 55%, echo, March, 2008.  . Dizziness     Dizziness with question of presyncope April 08, 2011  . Cancer     Melanoma   . Lumbar spondylosis with myelopathy 06/09/2013  . Complication of anesthesia     itching-not sure if oxycodone or anesthesia,and shakes  . Anxiety   . H/O hiatal hernia   . Heart murmur     67 yrs old rheumatic fever   . Heart murmur 2008    trivial tricusp regurg    Past Surgical History  Procedure Laterality Date  . Melanoma excision with sentinel lymph node biopsy  8/02    bilat ing node bx  . Anterior cervical decomp/discectomy fusion  2003  . Rotator cuff repair Left     08/13/11  08/27/2011  . Lumbar fusion  12/14  . Cervical discectomy      anterior with patellar allograft and plating  . Carpal tunnel release Right 10/31/2013    Procedure: RIGHT CARPAL TUNNEL RELEASE;   Surgeon: Cammie Sickle., MD;  Location: [facility];  Service: Orthopedics;  Laterality: Right;    Family History  Problem Relation Age of Onset  . Alzheimer's disease Father   . Alzheimer's disease Mother    Social History:  reports that he has never smoked. He has never used smokeless tobacco. He reports that he drinks alcohol. He reports that he does not use illicit drugs.  Allergies:  Allergies  Allergen Reactions  . Gentamycin [Gentamicin]   . Neosporin [Neomycin-Bacitracin Zn-Polymyx]   . Bacitracin   . Moxifloxacin Other (See Comments)    REACTION: pt had severe red rash- he thought from Avelox Rx....  . Triamcinolone Acetonide Other (See Comments)    Unknown    . Other Other (See Comments)    Redness  bandaide    No prescriptions prior to admission    No results found for this or any previous visit (from the past 48 hour(s)).  No results found.   Pertinent items are noted in HPI.  Height 5' 9.5" (1.765 m), weight 83.915 kg (185 lb).  General appearance: alert Head: Normocephalic, without obvious abnormality Neck: supple, symmetrical, trachea midline Resp: clear to auscultation bilaterally Cardio: regular rate and  rhythm GI: normal findings: bowel sounds normal Extremities: On exam in our office today he has a positive Phalen's and Tinel's. Progressive Dupuytren's palmar fibromatosis with pretendinous fibers to the right ring finger. Dr. Zebedee Iba completed detailed electrodiagnostic studies. These revealed moderate bilateral carpal tunnel syndrome. His amplitudes are 47.7 right and 41.7 left sensory.  Pulses: 2+ and symmetric Skin: normal Neurologic: Grossly normal    Assessment/Plan Impression: Left CTS  Plan: To the OR for left CTR.The procedure, risks,benefits and post-op course were discussed with the patient at length and they were in agreement with the plan.  DASNOIT,Nihar Klus J 11/27/2013, 2:39 PM  H&P documentation:  11/28/2013  -History and Physical Reviewed  -Patient has been re-examined  -No change in the plan of care  Cammie Sickle, MD

## 2013-11-28 ENCOUNTER — Encounter (HOSPITAL_BASED_OUTPATIENT_CLINIC_OR_DEPARTMENT_OTHER): Payer: PRIVATE HEALTH INSURANCE | Admitting: Anesthesiology

## 2013-11-28 ENCOUNTER — Encounter (HOSPITAL_BASED_OUTPATIENT_CLINIC_OR_DEPARTMENT_OTHER): Payer: Self-pay | Admitting: *Deleted

## 2013-11-28 ENCOUNTER — Ambulatory Visit (HOSPITAL_BASED_OUTPATIENT_CLINIC_OR_DEPARTMENT_OTHER): Payer: PRIVATE HEALTH INSURANCE | Admitting: Anesthesiology

## 2013-11-28 ENCOUNTER — Encounter (HOSPITAL_BASED_OUTPATIENT_CLINIC_OR_DEPARTMENT_OTHER)
Admission: RE | Disposition: A | Discharge status: Home / Self Care | Payer: Self-pay | Source: Ambulatory Visit | Attending: Orthopedic Surgery

## 2013-11-28 ENCOUNTER — Ambulatory Visit (HOSPITAL_BASED_OUTPATIENT_CLINIC_OR_DEPARTMENT_OTHER)
Admission: RE | Admit: 2013-11-28 | Discharge: 2013-11-28 | Disposition: A | Discharge status: Home / Self Care | Payer: PRIVATE HEALTH INSURANCE | Source: Ambulatory Visit | Attending: Orthopedic Surgery | Admitting: Orthopedic Surgery

## 2013-11-28 DIAGNOSIS — G5600 Carpal tunnel syndrome, unspecified upper limb: Secondary | ICD-10-CM | POA: Insufficient documentation

## 2013-11-28 DIAGNOSIS — F411 Generalized anxiety disorder: Secondary | ICD-10-CM | POA: Insufficient documentation

## 2013-11-28 DIAGNOSIS — K219 Gastro-esophageal reflux disease without esophagitis: Secondary | ICD-10-CM | POA: Insufficient documentation

## 2013-11-28 DIAGNOSIS — K449 Diaphragmatic hernia without obstruction or gangrene: Secondary | ICD-10-CM | POA: Insufficient documentation

## 2013-11-28 DIAGNOSIS — R011 Cardiac murmur, unspecified: Secondary | ICD-10-CM | POA: Insufficient documentation

## 2013-11-28 DIAGNOSIS — I10 Essential (primary) hypertension: Secondary | ICD-10-CM | POA: Insufficient documentation

## 2013-11-28 HISTORY — PX: CARPAL TUNNEL RELEASE: SHX101

## 2013-11-28 SURGERY — CARPAL TUNNEL RELEASE
Anesthesia: General | Site: Wrist | Laterality: Left

## 2013-11-28 MED ORDER — FENTANYL CITRATE 0.05 MG/ML IJ SOLN
INTRAMUSCULAR | Status: AC
  Filled 2013-11-28: qty 2

## 2013-11-28 MED ORDER — PROPOFOL 10 MG/ML IV EMUL
INTRAVENOUS | Status: AC
  Filled 2013-11-28: qty 50

## 2013-11-28 MED ORDER — CHLORHEXIDINE GLUCONATE 4 % EX LIQD: 60.0000 mL | Freq: Once | CUTANEOUS | Status: DC

## 2013-11-28 MED ORDER — LIDOCAINE HCL 2 % IJ SOLN
INTRAMUSCULAR | Status: AC
  Filled 2013-11-28: qty 20

## 2013-11-28 MED ORDER — ONDANSETRON HCL 4 MG/2ML IJ SOLN
INTRAMUSCULAR | Status: DC | PRN
  Administered 2013-11-28: 4 mg via INTRAVENOUS

## 2013-11-28 MED ORDER — LIDOCAINE HCL (CARDIAC) 20 MG/ML IV SOLN
INTRAVENOUS | Status: DC | PRN
  Administered 2013-11-28: 75 mg via INTRAVENOUS

## 2013-11-28 MED ORDER — LACTATED RINGERS IV SOLN
INTRAVENOUS | Status: DC
  Administered 2013-11-28: 08:00:00 via INTRAVENOUS

## 2013-11-28 MED ORDER — PROPOFOL 10 MG/ML IV BOLUS
INTRAVENOUS | Status: DC | PRN
  Administered 2013-11-28: 200 mg via INTRAVENOUS

## 2013-11-28 MED ORDER — DEXAMETHASONE SODIUM PHOSPHATE 4 MG/ML IJ SOLN
INTRAMUSCULAR | Status: DC | PRN
  Administered 2013-11-28: 10 mg via INTRAVENOUS

## 2013-11-28 MED ORDER — HYDROMORPHONE HCL PF 1 MG/ML IJ SOLN: 0.2500 mg | INTRAMUSCULAR | Status: DC | PRN

## 2013-11-28 MED ORDER — MIDAZOLAM HCL 2 MG/2ML IJ SOLN
INTRAMUSCULAR | Status: AC
  Filled 2013-11-28: qty 2

## 2013-11-28 MED ORDER — FENTANYL CITRATE 0.05 MG/ML IJ SOLN
INTRAMUSCULAR | Status: DC | PRN
  Administered 2013-11-28: 50 ug via INTRAVENOUS

## 2013-11-28 MED ORDER — OXYCODONE HCL 5 MG/5ML PO SOLN: 5.0000 mg | Freq: Once | ORAL | Status: DC | PRN

## 2013-11-28 MED ORDER — PROPOFOL INFUSION 10 MG/ML OPTIME
INTRAVENOUS | Status: DC | PRN
  Administered 2013-11-28: 100 ug/kg/min via INTRAVENOUS

## 2013-11-28 MED ORDER — MIDAZOLAM HCL 5 MG/5ML IJ SOLN
INTRAMUSCULAR | Status: DC | PRN
  Administered 2013-11-28: 1 mg via INTRAVENOUS

## 2013-11-28 MED ORDER — OXYCODONE HCL 5 MG PO TABS: 5.0000 mg | ORAL_TABLET | Freq: Once | ORAL | Status: DC | PRN

## 2013-11-28 MED ORDER — MIDAZOLAM HCL 2 MG/2ML IJ SOLN: 1.0000 mg | INTRAMUSCULAR | Status: DC | PRN

## 2013-11-28 MED ORDER — PROMETHAZINE HCL 25 MG/ML IJ SOLN: 6.2500 mg | INTRAMUSCULAR | Status: DC | PRN

## 2013-11-28 MED ORDER — FENTANYL CITRATE 0.05 MG/ML IJ SOLN: 50.0000 ug | INTRAMUSCULAR | Status: DC | PRN

## 2013-11-28 MED ORDER — LIDOCAINE HCL 2 % IJ SOLN
INTRAMUSCULAR | Status: DC | PRN
  Administered 2013-11-28: 3 mL

## 2013-11-28 SURGICAL SUPPLY — 44 items
BANDAGE ADH SHEER 1  50/CT (GAUZE/BANDAGES/DRESSINGS) IMPLANT
BANDAGE ELASTIC 3 VELCRO ST LF (GAUZE/BANDAGES/DRESSINGS) ×1 IMPLANT
BLADE SURG 15 STRL LF DISP TIS (BLADE) ×1 IMPLANT
BLADE SURG 15 STRL SS (BLADE) ×2
BNDG CMPR 9X4 STRL LF SNTH (GAUZE/BANDAGES/DRESSINGS) ×1
BNDG COHESIVE 3X5 TAN STRL LF (GAUZE/BANDAGES/DRESSINGS) ×1 IMPLANT
BNDG ESMARK 4X9 LF (GAUZE/BANDAGES/DRESSINGS) ×1 IMPLANT
BRUSH SCRUB EZ PLAIN DRY (MISCELLANEOUS) ×2 IMPLANT
CORDS BIPOLAR (ELECTRODE) ×1 IMPLANT
COVER MAYO STAND STRL (DRAPES) ×2 IMPLANT
COVER TABLE BACK 60X90 (DRAPES) ×2 IMPLANT
CUFF TOURNIQUET SINGLE 18IN (TOURNIQUET CUFF) ×1 IMPLANT
DECANTER SPIKE VIAL GLASS SM (MISCELLANEOUS) IMPLANT
DRAPE EXTREMITY T 121X128X90 (DRAPE) ×2 IMPLANT
DRAPE SURG 17X23 STRL (DRAPES) ×2 IMPLANT
GLOVE BIOGEL M STRL SZ7.5 (GLOVE) ×1 IMPLANT
GLOVE BIOGEL PI IND STRL 7.0 (GLOVE) IMPLANT
GLOVE BIOGEL PI INDICATOR 7.0 (GLOVE) ×3
GLOVE ECLIPSE 6.5 STRL STRAW (GLOVE) ×1 IMPLANT
GLOVE ORTHO TXT STRL SZ7.5 (GLOVE) ×2 IMPLANT
GLOVE SURG SS PI 7.5 STRL IVOR (GLOVE) ×1 IMPLANT
GOWN STRL REUS W/ TWL LRG LVL3 (GOWN DISPOSABLE) ×1 IMPLANT
GOWN STRL REUS W/ TWL XL LVL3 (GOWN DISPOSABLE) ×2 IMPLANT
GOWN STRL REUS W/TWL LRG LVL3 (GOWN DISPOSABLE) ×4
GOWN STRL REUS W/TWL XL LVL3 (GOWN DISPOSABLE) ×2
NDL SAFETY ECLIPSE 18X1.5 (NEEDLE) IMPLANT
NEEDLE 27GAX1X1/2 (NEEDLE) ×1 IMPLANT
NEEDLE HYPO 18GX1.5 SHARP (NEEDLE) ×2
PACK BASIN DAY SURGERY FS (CUSTOM PROCEDURE TRAY) ×2 IMPLANT
PAD CAST 3X4 CTTN HI CHSV (CAST SUPPLIES) ×1 IMPLANT
PADDING CAST ABS 4INX4YD NS (CAST SUPPLIES) ×1
PADDING CAST ABS COTTON 4X4 ST (CAST SUPPLIES) ×1 IMPLANT
PADDING CAST COTTON 3X4 STRL (CAST SUPPLIES) ×2
SPLINT PLASTER CAST XFAST 3X15 (CAST SUPPLIES) ×5 IMPLANT
SPLINT PLASTER XTRA FASTSET 3X (CAST SUPPLIES) ×5
SPONGE GAUZE 4X4 12PLY (GAUZE/BANDAGES/DRESSINGS) ×2 IMPLANT
STOCKINETTE 4X48 STRL (DRAPES) ×2 IMPLANT
STRIP CLOSURE SKIN 1/2X4 (GAUZE/BANDAGES/DRESSINGS) ×2 IMPLANT
SUT PROLENE 3 0 PS 2 (SUTURE) ×2 IMPLANT
SYR 3ML 23GX1 SAFETY (SYRINGE) IMPLANT
SYR CONTROL 10ML LL (SYRINGE) ×1 IMPLANT
TOWEL OR 17X24 6PK STRL BLUE (TOWEL DISPOSABLE) ×2 IMPLANT
TRAY DSU PREP LF (CUSTOM PROCEDURE TRAY) ×2 IMPLANT
UNDERPAD 30X30 INCONTINENT (UNDERPADS AND DIAPERS) ×2 IMPLANT

## 2013-11-28 NOTE — Transfer of Care (Signed)
Immediate Anesthesia Transfer of Care Note  Patient: Peter [name]  Procedure(s) Performed: Procedure(s): LEFT CARPAL TUNNEL RELEASE (Left)  Patient Location: PACU  Anesthesia Type:General  Level of Consciousness: awake, sedated and patient cooperative  Airway & Oxygen Therapy: Patient Spontanous Breathing and Patient connected to face mask oxygen  Post-op Assessment: Report given to PACU RN and Post -op Vital signs reviewed and stable  Post vital signs: Reviewed and stable  Complications: No apparent anesthesia complications

## 2013-11-28 NOTE — Discharge Instructions (Addendum)

## 2013-11-28 NOTE — Op Note (Signed)
[id number] 

## 2013-11-28 NOTE — Anesthesia Postprocedure Evaluation (Signed)
  Anesthesia Post-op Note  Patient: Peter [name]  Procedure(s) Performed: Procedure(s): LEFT CARPAL TUNNEL RELEASE (Left)  Patient Location: PACU  Anesthesia Type:General  Level of Consciousness: awake  Airway and Oxygen Therapy: Patient Spontanous Breathing  Post-op Pain: none  Post-op Assessment: Post-op Vital signs reviewed  Post-op Vital Signs: stable  Complications: No apparent anesthesia complications

## 2013-11-28 NOTE — Anesthesia Preprocedure Evaluation (Signed)
Anesthesia Evaluation  Patient identified by MRN, date of birth, ID band Patient awake    Reviewed: Allergy & Precautions, H&P , NPO status , Patient's Chart, lab work & pertinent test results  Airway Mallampati: I TM Distance: >3 FB Neck ROM: Full    Dental  (+) Teeth Intact   Pulmonary  breath sounds clear to auscultation        Cardiovascular hypertension, Pt. on medications + Valvular Problems/Murmurs Rhythm:Regular Rate:Normal     Neuro/Psych Anxiety Chronic back pain    GI/Hepatic hiatal hernia, GERD-  Medicated and Controlled,  Endo/Other    Renal/GU      Musculoskeletal   Abdominal   Peds  Hematology   Anesthesia Other Findings   Reproductive/Obstetrics                           Anesthesia Physical Anesthesia Plan  ASA: II  Anesthesia Plan: General   Post-op Pain Management:    Induction: Intravenous  Airway Management Planned: LMA  Additional Equipment:   Intra-op Plan:   Post-operative Plan: Extubation in OR  Informed Consent:   Dental advisory given  Plan Discussed with: CRNA and Surgeon  Anesthesia Plan Comments:         Anesthesia Quick Evaluation

## 2013-11-28 NOTE — Brief Op Note (Signed)
11/28/2013  9:03 AM  PATIENT:  [name]  68 y.o. male  PRE-OPERATIVE DIAGNOSIS:  LEFT CARPAL TUNNEL SYNDROME  POST-OPERATIVE DIAGNOSIS:  LEFT CARPAL TUNNEL SYNDROME  PROCEDURE:  Procedure(s): LEFT CARPAL TUNNEL RELEASE (Left)  SURGEON:  Surgeon(s) and Role:    * Cammie Sickle., MD - Primary  PHYSICIAN ASSISTANT:   ASSISTANTS: Surgical technician  ANESTHESIA:   general  EBL:     BLOOD ADMINISTERED:none  DRAINS: none   LOCAL MEDICATIONS USED:  XYLOCAINE   SPECIMEN:  No Specimen  DISPOSITION OF SPECIMEN:  N/A  COUNTS:  YES  TOURNIQUET:   Total Tourniquet Time Documented: Upper Arm (Left) - 15 minutes Total: Upper Arm (Left) - 15 minutes   DICTATION: .Other Dictation: Dictation Number [id number]  PLAN OF CARE: Discharge to home after PACU  PATIENT DISPOSITION:  PACU - hemodynamically stable.   Delay start of Pharmacological VTE agent (>24hrs) due to surgical blood loss or risk of bleeding: not applicable

## 2013-11-29 LAB — POCT I-STAT, CHEM 8
BUN: 16 mg/dL (ref 6–23)
Calcium, Ion: 1.21 mmol/L (ref 1.13–1.30)
Chloride: 105 mEq/L (ref 96–112)
Creatinine, Ser: 1 mg/dL (ref 0.50–1.35)
Glucose, Bld: 100 mg/dL — ABNORMAL HIGH (ref 70–99)
HCT: 47 % (ref 39.0–52.0)
Hemoglobin: 16 g/dL (ref 13.0–17.0)
Potassium: 3.8 mEq/L (ref 3.7–5.3)
Sodium: 141 mEq/L (ref 137–147)
TCO2: 23 mmol/L (ref 0–100)

## 2013-11-29 NOTE — Op Note (Signed)
NAME:  Peter, [name] NO.:  [id number]  MEDICAL RECORD NO.:  [id number]  LOCATION:                                 FACILITY:  PHYSICIAN:  Youlanda Mighty. Lacrisha Bielicki, [name] DATE OF BIRTH:  January 10, 1946  DATE OF PROCEDURE:  11/28/2013 DATE OF DISCHARGE:                              OPERATIVE REPORT   PREOPERATIVE DIAGNOSIS:  Chronic left median entrapment neuropathy, left carpal tunnel.  POSTOPERATIVE DIAGNOSIS:  Chronic left median entrapment neuropathy, left carpal tunnel.  OPERATIONS:  Release of left transverse carpal ligament.  OPERATING SURGEON:  Youlanda Mighty. Nicasio Barlowe, [name]  ASSISTANT:  Surgical technician.  ANESTHESIA:  General by LMA.  SUPERVISING ANESTHESIOLOGIST:  Ala Dach, [name]  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  He has had a history of multiple trigger fingers, calcium pyrophosphate deposition disease, and a history of a rotator cuff tear on the left.  He had electrodiagnostic studies in June 2013 documenting bilateral carpal tunnel syndrome.  He is status post release of his right transverse carpal ligament 4 weeks prior.  He now returns for release of his left transverse carpal ligament.  Preoperatively, he was reminded of the potential risks and benefits of surgery.  Questions were invited and answered in detail.  He noted that he had a significant amount of pain medication left over from his prior surgery, therefore he will not require a prescription at this time.  Preoperatively, he was interviewed in the holding area and his proper surgical site of the left hand was marked with a marking pen per protocol.  He was interviewed by Dr. Orene Desanctis who reviewed anesthesia choices and recommended general anesthesia by LMA technique.  PROCEDURE:  Berl Bonfanti was brought to room 2 of the [facility] and placed in a supine position upon the operating table.  Following  the induction of general anesthesia by LMA technique under Dr. Griffin Dakin direct supervision, the left hand and arm were prepped with Betadine soap and solution, sterilely draped.  Following exsanguination of the left arm with Esmarch bandage, the arterial tourniquet on the proximal brachium was inflated to 220 mmHg.  Routine surgical time-out was accomplished.  Procedure commenced with a 2-cm incision in the line of the ring finger in the palm.  Subcutaneous tissues were carefully divided revealing the palmar fascia.  This was split longitudinally revealing the common sensory branches of the median nerve.  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  The distal margin of the transverse carpal ligament was identified and released with scissors followed by sounding the canal with a Insurance account manager.  The ulnar aspect of the transverse carpal ligament was then sequentially released with scissors into the distal forearm.  The volar forearm fascia was released subcutaneously.  Bleeding points were electrocauterized with bipolar current followed by repair of the skin with intradermal 3-0 Prolene suture.  The wound margins were infiltrated with 2% lidocaine for postoperative comfort followed by application of Steri-Strips, sterile gauze, sterile Webril, and a volar plaster splint maintaining the  wrist in 15 degrees of dorsiflexion.  For aftercare, [name] was provided a prescription for Percocet at his last surgery.  He will not require medications at this time.     Youlanda Mighty Devlyn Retter, [name]     RVS/MEDQ  D:  11/28/2013  T:  11/29/2013  Job:  [id number]  cc:   Deborra Medina. Lenna Gilford, MD

## 2013-11-30 ENCOUNTER — Ambulatory Visit
Admission: RE | Admit: 2013-11-30 | Discharge: 2013-11-30 | Disposition: A | Discharge status: Home / Self Care | Payer: PRIVATE HEALTH INSURANCE | Source: Ambulatory Visit | Attending: Neurosurgery | Admitting: Neurosurgery

## 2013-11-30 ENCOUNTER — Encounter (HOSPITAL_BASED_OUTPATIENT_CLINIC_OR_DEPARTMENT_OTHER): Payer: Self-pay | Admitting: Orthopedic Surgery

## 2013-11-30 DIAGNOSIS — Z79899 Other long term (current) drug therapy: Secondary | ICD-10-CM

## 2013-12-03 ENCOUNTER — Other Ambulatory Visit: Payer: Self-pay | Admitting: Pulmonary Disease

## 2013-12-28 ENCOUNTER — Telehealth: Payer: Self-pay | Admitting: Pulmonary Disease

## 2013-12-28 MED ORDER — MOMETASONE FUROATE 50 MCG/ACT NA SUSP: NASAL | Status: DC

## 2013-12-28 MED ORDER — AZITHROMYCIN 250 MG PO TABS: ORAL_TABLET | ORAL | Status: AC

## 2013-12-28 NOTE — Telephone Encounter (Signed)
Pt is aware of SN's recs. Both prescriptions have been sent in. Nothing further is needed.

## 2013-12-28 NOTE — Telephone Encounter (Signed)
Per SN---  Ok to call in zpak #1  Take as directed nasonex 2 sprays in each nostril bid with 5 refills. thanks

## 2013-12-28 NOTE — Telephone Encounter (Signed)
Spoke with pt. SN sent in a Medrol dose pak for him already today. He was under the impression that a Zpack was going to sent in too. Also would like Nasonex sent in because he has used Afrin the past 2 nights.  Allergies  Allergen Reactions  . Gentamycin [Gentamicin]   . Neosporin [Neomycin-Bacitracin Zn-Polymyx]   . Bacitracin   . Moxifloxacin Other (See Comments)    REACTION: pt had severe red rash- he thought from Avelox Rx....  . Triamcinolone Acetonide Other (See Comments)    Unknown    . Other Other (See Comments)    Redness  bandaide   SN - please advise. Thanks.

## 2014-02-01 ENCOUNTER — Other Ambulatory Visit: Payer: Self-pay | Admitting: Orthopaedic Surgery

## 2014-02-01 ENCOUNTER — Ambulatory Visit
Admission: RE | Admit: 2014-02-01 | Discharge: 2014-02-01 | Disposition: A | Discharge status: Home / Self Care | Payer: PRIVATE HEALTH INSURANCE | Source: Ambulatory Visit | Attending: Orthopaedic Surgery | Admitting: Orthopaedic Surgery

## 2014-02-01 DIAGNOSIS — M25561 Pain in right knee: Secondary | ICD-10-CM

## 2014-02-01 DIAGNOSIS — M25562 Pain in left knee: Principal | ICD-10-CM

## 2014-02-01 DIAGNOSIS — M79642 Pain in left hand: Secondary | ICD-10-CM

## 2014-02-01 DIAGNOSIS — M79641 Pain in right hand: Secondary | ICD-10-CM

## 2014-02-15 DIAGNOSIS — E785 Hyperlipidemia, unspecified: Secondary | ICD-10-CM | POA: Insufficient documentation

## 2014-03-23 ENCOUNTER — Encounter (INDEPENDENT_AMBULATORY_CARE_PROVIDER_SITE_OTHER): Payer: Self-pay

## 2014-03-23 ENCOUNTER — Ambulatory Visit
Admission: RE | Admit: 2014-03-23 | Discharge: 2014-03-23 | Disposition: A | Discharge status: Home / Self Care | Payer: PRIVATE HEALTH INSURANCE | Source: Ambulatory Visit | Attending: Neurosurgery | Admitting: Neurosurgery

## 2014-03-30 ENCOUNTER — Telehealth: Payer: Self-pay | Admitting: Pulmonary Disease

## 2014-03-30 NOTE — Telephone Encounter (Signed)
Called and spoke with pt and appt has been scheduled for the pt to see SN on 6/26.  Nothing further is needed.

## 2014-04-07 ENCOUNTER — Other Ambulatory Visit: Payer: Self-pay | Admitting: Pulmonary Disease

## 2014-04-20 ENCOUNTER — Ambulatory Visit (INDEPENDENT_AMBULATORY_CARE_PROVIDER_SITE_OTHER): Payer: PRIVATE HEALTH INSURANCE | Admitting: Pulmonary Disease

## 2014-04-20 ENCOUNTER — Encounter: Payer: Self-pay | Admitting: Pulmonary Disease

## 2014-04-20 VITALS — BP 132/70 | HR 71 | Temp 97.7°F | Ht 70.0 in | Wt 183.0 lb

## 2014-04-20 DIAGNOSIS — K219 Gastro-esophageal reflux disease without esophagitis: Secondary | ICD-10-CM

## 2014-04-20 DIAGNOSIS — M4716 Other spondylosis with myelopathy, lumbar region: Secondary | ICD-10-CM

## 2014-04-20 DIAGNOSIS — J309 Allergic rhinitis, unspecified: Secondary | ICD-10-CM

## 2014-04-20 DIAGNOSIS — Z23 Encounter for immunization: Secondary | ICD-10-CM

## 2014-04-20 DIAGNOSIS — M1990 Unspecified osteoarthritis, unspecified site: Secondary | ICD-10-CM

## 2014-04-20 DIAGNOSIS — E785 Hyperlipidemia, unspecified: Secondary | ICD-10-CM

## 2014-04-20 DIAGNOSIS — C439 Malignant melanoma of skin, unspecified: Secondary | ICD-10-CM

## 2014-04-20 DIAGNOSIS — Z86010 Personal history of colonic polyps: Secondary | ICD-10-CM

## 2014-04-20 DIAGNOSIS — I10 Essential (primary) hypertension: Secondary | ICD-10-CM

## 2014-04-20 DIAGNOSIS — Z0000 Encounter for general adult medical examination without abnormal findings: Secondary | ICD-10-CM

## 2014-04-20 MED ORDER — ALPRAZOLAM 0.5 MG PO TABS: ORAL_TABLET | ORAL | Status: DC

## 2014-04-20 NOTE — Patient Instructions (Signed)
Today we updated your med list in our EPIC system...    Continue your current medications the same...    We refilled your meds per request...  We reviewed your prev LABS, XRAYS, and EKG...  Stay as active as poss, & keep up the good work w/ diet etc...  Call for any questions or if I can be of service in any way.Marland KitchenMarland Kitchen

## 2014-04-20 NOTE — Progress Notes (Signed)
Subjective:    Patient ID: Peter [name], male    DOB: 1946-05-29, 68 y.o.   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  see prob list below:  ~  June 20, 2010:  Add-on today at pt request for intermittent R>L hip area pain, worse recently & Rx w/ OTC Ibuprofen w/ some help... no prev ortho problems, but mild OA apparent on exam fingers etc... exam shows good ROM w/o pain, non-tender, no skin lesions, etc... we discussed Rx w/ MOBIC, check XRays, & next step= Ortho eval for poss shot in the area.  ~  April 24, 2011:  90mo ROV & CPX> [name] has had a number of medical complaints as outlined in his accompanying note> wife was concerned all this was having a toll & that [name] might be depressed, we discussed this & decided to hold off on any new meds at this time...    He had dizziness episode, near syncope, & saw [facility] 6/12 for Cards eval> EKG NSR, WNL; CDopplers w/ min plaque, no signif stenoses; he did not feel that further eval was warranted...    He's had an excellent Rheum eval from [facility] w/ OA & CPPD> on Colcrys 1.5 tabs daily, plus Celebrex 200mg  Bid prn... He's had Dupytren's injection in hands from DrSypher as well and some triggering of the right 4th digit...    [name] noted muscle fasciculations & had a thorough eval from [place] [facility] (we don't have his notes)> pt indicates that they are felt to be secondary to his lumbar spondylosis (3 levels) for which he has had several epidural steroid shots & has been checked by [facility]; EMG/ NCV were normal & no signs of upper motor neuron disease...  CXR 9/09 showed clear, WNL;  EKG= NSR, WNL;  LABS= FLP off Statin showed TChol 228 & LDL 152; Chems all wnl x LFTs borderline elev; CBC wnl; TSH/ PSA/ Sed all wnl...  ~  April 11, 2012:  Yearly ROV & CPX>  [name] has had a number of issues this past yr- mostly of an Orthopedic/ Neurological/ Allergic nature and outlined below...    No further severe dizzy spells/ near syncope;  BP remains stable, no chest pain/ angina, etc;  He tried off Statins for some time, no change in his status therefore restarted Crestor10mg /d...    Ortho problems included left rotator cuff tear and surg by DrWhitfield 00/86 complicated by an unusual allergic reaction to the suture material w/ erythema in skin and a blister, he took antibiotics & was seen by ID, required a second operation w/ sutures changed 11/12, and a subspecialty Derm eval at [facility] showing allergic reactions to Gentamicin, Bacitracin, Neosporin, Polysporin, Iodine, Triamcinolone...    He continues to f/u w/ DrHawkes for Rheum- CPPD/Pseudogout on Colchicine 0.6mg Bid & Celebrex 200mg  1-2 daily & tolerated well...    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    We reviewed prob list, meds, xrays and labs> see below>>  CXR 6/13 showed normal heart size, clear lungs, radiopaque sutures seen in left shoulder, distal clav resection, lower CSpine  plate & screws, mild thoracic spondylosis...  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     HEARING LOSS> he has bilat hearing aides from [employer]...     HBP> on Amlod10, Accupril10, HCT25, K20; BP= 132/70 and he denies CP, palpit, SOB, edema; he exercises at the gym & walking; not yet back to playing golf due to his back surg...     Hx Rheumatic fever age14, prob pericarditis in 2008> see results of prev evaluations w/ Myoview, CoronaryCTA, 2DEcho, CDopplers below...     CHOL> on Crestor10; FLP done 4/15 [facility] showed TChol 174, TG 62, HDL 60, LDL 102...    GI- GERD, Polyps> on Zantac150Bid; followed by [facility], last colonoscopy was 5/11- neg, no recurrent polyps & last polyp removed 4/04= hyperplastic...     ORTHO- DJD, CPPD, Hx left rotator cuff repair 10/12 by DrWhitfield, CSpine surg 2003 by [facility], Lumbar spine decompression & 2 level fusion 12/14 by [facility] (for spinal stenosis & myelopathy- improved), s/p bilat carpal tunnel releases by DrSypher 1/15 & 2/15...  He has resid LBP, DJD & pseudogout symptoms- on Celebrex200Bid, Colchicine0.6Bid, & Plaquenil200 started by rheum= Dr. Berenda Morale at [facility]...     Hx Melanoma> level 2 SSM removed from abd wall 2002, followed by Derm- Dr. Milagros Reap at [facility]... No known recurrence...    Fam Hx alzheimer's dis> both parents had severe AD...  We reviewed prob list, meds, xrays and labs> see below for updates >>   CXR 11/14 showed norm heart size, clear lungs, NAD; plate & screw fixation device in lower Cspine, sutures seen over the left shoulder, s/p resection of the distal left clavicle, DDD in TSpine...   EKG 11/14 showed SBrady, rate58, wnl, NAD...   LABS 4/15:  FLP- at goals on Cres10 x LDL=102;  Chems- wnl;  CBC- wnl;  TSH=1.72;  PSA= 1.74...  BONE DENSITY 2/15:  Normal w/ lowest Tscore= -0.5 in left femoral neck...     Problem List:  ALLERGIC RHINITIS (ICD-477.9) - eval by DrKozlow et al in 2006... seasonal symptoms treated w/ OTC meds as needed... prev on SINGULAIR 10mg  prn, and NASONEX 2sp Qhs... ~  Note: he had allergic reaction to suture material after left rotator cuff surg  10/12; Specialty Derm eval at [facility] showed allergic to Gentamicin, Bacitracin, Neosporin, Polysporin, Iodine, Triamcinolone...  TINNITUS, CHRONIC - eval by DrKraus in the past... chronic symptom- deals with it effectively using white noise etc... HEARING LOSS - he now has bilat hearing aides and doing better...  HYPERTENSION (ICD-401.9) - controlled on meds- ASA 81mg /d,  NORVASC 10mg /d,  ACCUPRIL 10mg /d,  HCTZ 25mg /d,  KCl 28mEq/d...   ~  6/12:  BP= 134/84 today, takes meds regularly & tol well w/o side effects... BP checks at home and work are similar... denies HA, fatigue, visual changes, CP, palipit, syncope, dyspnea, edema, etc...  ~  6/13:  BP= 138/76 & he remains asymptomatic w/o CP, palpit, SOB, edema, etc... ~  CXR 11/14 showed norm heart size, clear lungs,  post op ortho surg changes...  ~  6/15:  BP= 132/70 & he denies any CP, palpit, SOB, edema, etc...  Hx of RHEUMATIC FEVER (ICD-390) - age 65, hosp for 3 days- no known sequelae; eval by cardiology in the 80's & no valvular heart disease...  Hx of CHEST PAIN w/ neg cardiac eval in 2008... EKG is normal> Episode of DIZZINESS/ Presyncope 6/12 w/ eval by Benay Spice... ~  Cardiolite in 2005 was neg- no ischemia & EF=60%...  ~  he was hosp w/ CP episode 3/08 by DrBBrodie- felt to be prob pericarditis...  ~  Coronary CTA in 2008 showing sl plaque at an LAD branch vessel (D2), mixed plaque in CIRC, no plaque in prox-mid RCA; Calcium score 0.65 (<25th percentlie)...  ~  2DEcho 3/08 showed no signif valvular lesions, LVwall thickness at upper limit, no regional wall motion abn, LVF= 50-55%... ~  CDopplers 6/12 showed min plaque, no signif ICA stenoses... ~  6/13:  No recurrence of the severe dizziness, near syncope, etc... ~  EKG 11/14 showed SBrady, rate58, wnl, NAD.  VENOUS INSUFFIC >> he has chr ven insuffic changes in LEs but no signif edema on the HCT & low salt diet...   HYPERCHOLESTEROLEMIA, BORDERLINE (ICD-272.4) - he started taking  Lipitor prev due to reports of poss benefits in Alzheimer's prevention; Prev on LIPITOR 40mg /d and LOVAZA 4gm/d; we stopped the Statin Rx w/ his fasciculations & other symptoms but there was no change off the medication; f/u FLP 6/12 w/ LDL 152 & we decided to try CRESTOR 10mg /d as a trial. ~  FLP 4/08 on Lipitor40 showed TChol 138, TG 45, HDL 49, LDL 80 ~  FLP 9/09 on ?diet alone showed TChol 171, TG 54, HDL 56, LDL 104 ~  FLP 3/11 on Lip40+Lovaza showed TChol 124, TG 51, HDL 63, LDL 51 ~  FLP 6/12 off Lip40 on Lovaza showed TChol 228, TG 53, HDL 58, LDL 152... rec Cres10. ~  FLP 3/13 on Cres10 ([facility]) showed TChol 165, TG 57, HDL 65, LDL 89... Continue same. ~  Addison 4/15 on Cres10 ([facility] labs) showed TChol 174, TG 62, HDL 60, LDL 102  GERD (ICD-530.81) - he has been evaluated by [facility]... Prev on PROTONIX but now uses ZANTAC 150Bid...   COLONIC POLYPS, HX OF (ICD-V12.72) - colonoscopy 4/04 by [facility] w/ diminutive hyperplastic polyp removed & sm int hem...   ~  f/u colonoscopy done 5/11 was neg- no polyps...  DEGENERATIVE JOINT DISEASE (ICD-715.90)> on CELEBREX 200mg  Bid... Hx of PSEUDOGOUT (ICD-275.49) - he has hx CPPD w/ eval by Rheum here, [facility], & [facility]> Rx w/ COLCRYS 0.6mg  Bid... ~  8/11: presented w/ c/o R>L hip pain, XRays showed degen arthritis & mild chondrocalcinosis. ~  Subsequent evals by Rheum- DrHawkes & [facility] confirmed CPPD ~  He has right hip area pain> from lumbar spondylosis & improved after several ESI injections... ~  s/p bilat carpal tunnel releases by DrSypher 1/15 & 2/15... ~  6/15: now followed by Dr. Berenda Morale at [facility] on PLAQUENIL 200mg /d...  LEFT SHOULDER ROTATOR CUFF TEAR >> he is s/p left shoulder surg 10/12 by DrWhitfield, followed by post op left shoulder inflammation- ?infectious (none found), ?allergic reaction to suture material, required second operation 11/12 to remove sutures & replace w/ stainless steel sutures==> all resolved now, operative notes  reviewed...  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 known thoracic spondylosis  w/ osteophytes at T8 & T9 seen on his prev CoronaryCTA done 3/08... also lumbar spondy w/ spurs at L4-5 seen on prev CTAbd done 12/06... subseq MRI Lumbar area w/ multilevel spondylosis, mod central canal stenosis & 3 level lateral recess stenosis> several epid steroid shots by IR w/ partial relief... ~  6/13:  He reports being on GABAPENTIN 300mg  Tid per DrLove... ~  12/14:  He had Lumbar spine decompression & 2 level fusion by DrNudelman (for spinal stenosis & myelopathy- improved)...  Hx of MELANOMA (ICD-172.9) - hx of level II SSMelanoma 0.71mm thick removed from abd wall (just to the right of navel) w/ wide excision and neg sentinel node biopsy by DrPYoung 8/02 (bilat inguinal node surg)... followed closely by DrPatMauro at [facility] & no known recurrence (switching to [facility]- DrJones)...  ALZHEIMER'S DISEASE, FAMILY HX (ICD-V17.0) - family hx of Alzheimer's disease in both his mother and father (both died in their 51's w/ this disease)...  R/O Depression>  6/12 wife shared her concern for Koltyn's mood esp as it regards his medical illnesses; after 3way discussion we decided to hold off on any med Rx but will reconsider if needed in follow up... ~  6/13:  He reports doing better after his surgeries, ESI shots, Derm/Allergy eval at [facility], etc;  He is getting more exercise & played 9holes of golf recently...  DERMATOLOGY/ ALLERGY TESTING at [facility] >> pages supplied by DrBarry scanned into the EPIC EMR for reference- +reaction to Triamcinolone (should avoid Budesonide, Flunisolide, Fluocinolone, hydrocortisone)...   Past Medical History  Diagnosis Date  . Allergic rhinitis   . Tinnitus     chronic  . Hypertension   . History of rheumatic fever   . Hypercholesteremia     borderline  . GERD  (gastroesophageal reflux disease)   . History of colonic polyps   . DJD (degenerative joint disease)   . History of pseudogout   . Spondylosis   . Family history of Alzheimer's disease   . History of melanoma   . Chest pain     Nuclear 2005, normal / coronary CTA 2008, slight mixed plaque, coronary calcium score 0.65, ejection fraction 55%, echo, March, 2008  . Ejection fraction     EF 55%, echo, March, 2008.  . Dizziness     Dizziness with question of presyncope April 08, 2011  . Cancer     Melanoma   . Lumbar spondylosis with myelopathy 06/09/2013  . Complication of anesthesia     itching-not sure if oxycodone or anesthesia,and shakes  . Anxiety   . H/O hiatal hernia   . Heart murmur     68 yrs old rheumatic fever   . Heart murmur 2008    trivial tricusp regurg    Past Surgical History  Procedure Laterality Date  . Melanoma excision with sentinel lymph node biopsy  8/02    bilat ing node bx  . Anterior cervical decomp/discectomy fusion  2003  . Rotator cuff repair Left     08/13/11  08/27/2011  . Lumbar fusion  12/14  . Cervical discectomy      anterior with patellar allograft and plating  . Carpal tunnel release Right 10/31/2013    Procedure: RIGHT CARPAL TUNNEL RELEASE;  Surgeon: Cammie Sickle., MD;  Location: [place];  Service: Orthopedics;  Laterality: Right;  . Carpal tunnel release Left 11/28/2013    Procedure: LEFT CARPAL TUNNEL RELEASE;  Surgeon: Cammie Sickle., MD;  Location: [facility];  Service: Orthopedics;  Laterality: Left;    Outpatient Encounter Prescriptions as of 04/20/2014  Medication Sig  . ALPRAZolam (XANAX) 0.5 MG tablet Take 1/2 to 1 tablet by mouth three times daily as needed for anxiety  . amLODipine (NORVASC) 10 MG tablet TAKE 1 TABLET DAILY  . celecoxib (CELEBREX) 200 MG capsule Take 200 mg by mouth 2 (two) times daily.  . colchicine 0.6 MG tablet Take 0.6 mg by mouth 2 (two) times daily.   . CRESTOR 10  MG tablet TAKE 1 TABLET AT BEDTIME  . cycloSPORINE (RESTASIS) 0.05 % ophthalmic emulsion Place 1 drop into both eyes 2 (two) times daily.  . hydrochlorothiazide (HYDRODIURIL) 25 MG tablet TAKE 1 TABLET DAILY  . hydroxychloroquine (PLAQUENIL) 200 MG tablet Take 200 mg by mouth daily.  . potassium chloride SA (K-DUR,KLOR-CON) 20 MEQ tablet Take 1 tablet (20 mEq total) by mouth daily.  . quinapril (ACCUPRIL) 10 MG tablet Take 10 mg by mouth daily.  . ranitidine (ZANTAC) 150 MG capsule Take 150 mg by mouth 2 (two) times daily.  . [DISCONTINUED] ALPRAZolam (XANAX) 0.5 MG tablet Take 1/2 to 1 tablet by mouth three times daily as needed for anxiety  . [DISCONTINUED] gabapentin (NEURONTIN) 300 MG capsule Take 300 mg by mouth 2 (two) times daily.   . [DISCONTINUED] mometasone (NASONEX) 50 MCG/ACT nasal spray Place 2 sprays in each nostril twice daily  . [DISCONTINUED] pantoprazole (PROTONIX) 40 MG tablet Take 40 mg by mouth daily.    Allergies  Allergen Reactions  . Gentamycin [Gentamicin]   . Neosporin [Neomycin-Bacitracin Zn-Polymyx]   . Bacitracin   . Moxifloxacin Other (See Comments)    REACTION: pt had severe red rash- he thought from Avelox Rx....  . Triamcinolone Acetonide Other (See Comments)    Unknown    . Other Other (See Comments)    Redness  bandaide    Current Medications, Allergies, Past Medical History, Past Surgical History, Family History, and Social History were reviewed in [employer] record.   Review of Systems         See HPI - all other systems neg except as noted...  The patient denies anorexia, fever, weight loss, weight gain, vision loss, decreased hearing, hoarseness, chest pain, syncope, dyspnea on exertion, peripheral edema, prolonged cough, headaches, hemoptysis, abdominal pain, melena, hematochezia, severe indigestion/heartburn, hematuria, incontinence, muscle weakness, suspicious skin lesions, transient blindness, difficulty walking,  depression, unusual weight change, abnormal bleeding, enlarged lymph nodes, and angioedema.     Objective:   Physical Exam     WD, WN, 68 y/o WM in NAD... Vital Signs:  Reviewed... GENERAL:  Alert & oriented; pleasant & cooperative... HEENT:  [place]/AT, EOM-wnl, PERRLA, EACs-clear, TMs-wnl, NOSE-clear, THROAT-clear & wnl. NECK:  Supple w/ fairROM s/p surg; no JVD; normal carotid impulses w/o bruits; no thyromegaly or nodules palpated; no lymphadenopathy. CHEST:  Clear to P & A; without wheezes/ rales/ or rhonchi. HEART:  Regular Rhythm; without murmurs/ rubs/ or gallops. ABDOMEN:  Soft & nontender; right peri-umbil scar of melanoma surg; normal bowel sounds; no organomegaly or masses palpated. (RECTAL:  Neg - prostate 2+ & nontender w/o nodules; stool hematest neg; groin lymphoceles) EXT:  mild OA fingers- sl decr ROM hips/ knees; no varicose veins/ +venous insuffic/ no edema; pulses intact & WNL. NEURO:  CN's intact; motor testing normal; sensory testing normal; gait normal & balance OK; scar of prev Lumbar & CSpine surg... DERM:  No lesions noted; scar left shoulder & scar to  right of umbilicus.  RADIOLOGY DATA:  Reviewed in the EPIC EMR & discussed w/ the patient...  LABORATORY DATA:  Reviewed in the EPIC EMR & discussed w/ the patient...   Assessment & Plan:    HBP>  BP controlled on meds, continue same...  CARDIAC EVAL by Benay Spice in 2012 as outlined above... Pt is reassured...  HYPERCHOLESTEROLEMIA>  His LDL off Lipitor was 152 & he started on Crestor10 and f/u FLP looks good...  GI>  GERD, Polyps>  Stable on Zantac & up to date on screening...  RHEUM>  OA, CPPD>  Followed by Kathlynn Grate on Celebrex & Colcrys...  Hx CSpine fusion, LBP w/ decompression & fusion>  Known spondylosis & s/p several epidural steroid shots, ultimately had surg 12/14 by [facility] & improved...  S/P LEFT SHOULDER SURG w/ Complications>  As outlined above; he is finally better, w/ good ROM.Marland KitchenMarland Kitchen  Hx of  Muscle Fasciculations>  He's had a thorough Neuro eval from [facility] w/ neg EMG/ NCV (no evid for upper motor neuron dis) & he is reassured...  ALLERGIC REACTION TO SUTURE MATERIAL>  This occurred after his left rotator cuff surg 10/12 7 required re-operation & chanfe to stainless steel wire sutures 11/12...  DERMATOLOGY Eval at [facility]>  Hx Melanoma w/o recurrence; [facility] eval w/ finding of reactions to Gentamicin, Bacitracin, Neosporin, Polysporin, Iodine, Triamcinolone, Gold, others...   Patient's Medications  New Prescriptions   No medications on file  Previous Medications   AMLODIPINE (NORVASC) 10 MG TABLET    TAKE 1 TABLET DAILY   CELECOXIB (CELEBREX) 200 MG CAPSULE    Take 200 mg by mouth 2 (two) times daily.   COLCHICINE 0.6 MG TABLET    Take 0.6 mg by mouth 2 (two) times daily.    CRESTOR 10 MG TABLET    TAKE 1 TABLET AT BEDTIME   CYCLOSPORINE (RESTASIS) 0.05 % OPHTHALMIC EMULSION    Place 1 drop into both eyes 2 (two) times daily.   HYDROCHLOROTHIAZIDE (HYDRODIURIL) 25 MG TABLET    TAKE 1 TABLET DAILY   HYDROXYCHLOROQUINE (PLAQUENIL) 200 MG TABLET    Take 200 mg by mouth daily.   POTASSIUM CHLORIDE SA (K-DUR,KLOR-CON) 20 MEQ TABLET    Take 1 tablet (20 mEq total) by mouth daily.   QUINAPRIL (ACCUPRIL) 10 MG TABLET    Take 10 mg by mouth daily.   RANITIDINE (ZANTAC) 150 MG CAPSULE    Take 150 mg by mouth 2 (two) times daily.  Modified Medications   Modified Medication Previous Medication   ALPRAZOLAM (XANAX) 0.5 MG TABLET ALPRAZolam (XANAX) 0.5 MG tablet      Take 1/2 to 1 tablet by mouth three times daily as needed for anxiety    Take 1/2 to 1 tablet by mouth three times daily as needed for anxiety  Discontinued Medications   GABAPENTIN (NEURONTIN) 300 MG CAPSULE    Take 300 mg by mouth 2 (two) times daily.    MOMETASONE (NASONEX) 50 MCG/ACT NASAL SPRAY    Place 2 sprays in each nostril twice daily   PANTOPRAZOLE (PROTONIX) 40 MG TABLET    Take 40 mg by mouth daily.

## 2014-05-05 ENCOUNTER — Inpatient Hospital Stay: Admission: RE | Admit: 2014-05-05 | Payer: PRIVATE HEALTH INSURANCE | Source: Ambulatory Visit

## 2014-05-05 ENCOUNTER — Other Ambulatory Visit: Payer: Self-pay | Admitting: Orthopaedic Surgery

## 2014-05-05 ENCOUNTER — Ambulatory Visit
Admission: RE | Admit: 2014-05-05 | Discharge: 2014-05-05 | Disposition: A | Discharge status: Home / Self Care | Payer: PRIVATE HEALTH INSURANCE | Source: Ambulatory Visit | Attending: Orthopaedic Surgery | Admitting: Orthopaedic Surgery

## 2014-05-05 DIAGNOSIS — M25561 Pain in right knee: Secondary | ICD-10-CM

## 2014-05-07 ENCOUNTER — Other Ambulatory Visit: Payer: PRIVATE HEALTH INSURANCE

## 2014-05-26 ENCOUNTER — Other Ambulatory Visit: Payer: Self-pay | Admitting: Pulmonary Disease

## 2014-05-28 ENCOUNTER — Other Ambulatory Visit: Payer: Self-pay | Admitting: Pulmonary Disease

## 2014-05-28 MED ORDER — COLCHICINE 0.6 MG PO TABS: 0.6000 mg | ORAL_TABLET | Freq: Two times a day (BID) | ORAL | Status: DC

## 2014-06-05 ENCOUNTER — Other Ambulatory Visit: Payer: Self-pay | Admitting: Orthopaedic Surgery

## 2014-06-05 ENCOUNTER — Ambulatory Visit
Admission: RE | Admit: 2014-06-05 | Discharge: 2014-06-05 | Disposition: A | Discharge status: Home / Self Care | Payer: PRIVATE HEALTH INSURANCE | Source: Ambulatory Visit | Attending: Orthopaedic Surgery | Admitting: Orthopaedic Surgery

## 2014-06-05 DIAGNOSIS — M25561 Pain in right knee: Secondary | ICD-10-CM

## 2014-06-11 ENCOUNTER — Ambulatory Visit: Payer: PRIVATE HEALTH INSURANCE | Admitting: Neurology

## 2014-08-30 NOTE — Telephone Encounter (Signed)
Error

## 2014-09-06 ENCOUNTER — Other Ambulatory Visit: Payer: Self-pay | Admitting: Pulmonary Disease

## 2014-09-06 MED ORDER — ROSUVASTATIN CALCIUM 10 MG PO TABS: ORAL_TABLET | ORAL | Status: DC

## 2014-09-12 ENCOUNTER — Encounter: Payer: Self-pay | Admitting: Neurology

## 2014-09-18 ENCOUNTER — Encounter: Payer: Self-pay | Admitting: Neurology

## 2014-11-23 ENCOUNTER — Other Ambulatory Visit: Payer: Self-pay | Admitting: Pulmonary Disease

## 2014-11-23 MED ORDER — POTASSIUM CHLORIDE CRYS ER 20 MEQ PO TBCR: 20.0000 meq | EXTENDED_RELEASE_TABLET | Freq: Every day | ORAL | Status: DC

## 2014-12-19 ENCOUNTER — Telehealth: Payer: Self-pay | Admitting: Pulmonary Disease

## 2014-12-19 MED ORDER — MONTELUKAST SODIUM 10 MG PO TABS: 10.0000 mg | ORAL_TABLET | Freq: Every day | ORAL | Status: DC

## 2014-12-19 MED ORDER — AZITHROMYCIN 250 MG PO TABS: ORAL_TABLET | ORAL | Status: DC

## 2014-12-19 MED ORDER — MOMETASONE FUROATE 50 MCG/ACT NA SUSP: 2.0000 | Freq: Every day | NASAL | Status: DC

## 2014-12-19 MED ORDER — PROMETHAZINE-CODEINE 6.25-10 MG/5ML PO SYRP: 5.0000 mL | ORAL_SOLUTION | Freq: Four times a day (QID) | ORAL | Status: DC | PRN

## 2014-12-19 NOTE — Telephone Encounter (Signed)
Pt called in today and stated that he has cough with chest congestion and hoarseness.  Requesting that cough meds, zpak, singular and nasonex be sent to his pharmacy.  Per SN--- Ok to send these in.  Called and spoke with pt and he stated that he will pick these up later today.  Nothing further is needed.

## 2014-12-24 ENCOUNTER — Telehealth: Payer: Self-pay | Admitting: Pulmonary Disease

## 2014-12-24 NOTE — Telephone Encounter (Signed)
Dr. Judie Grieve with pt

## 2014-12-24 NOTE — Telephone Encounter (Signed)
Called and spoke to pt. Pt is requesting albuterol inhaler. Pt stated he "feels there are spider webs in airway upon exhalation". Pt stated he is still having a dry cough and orthopnea. Pt stated he not sleeping well d/t the need for elevation of head. Pt aware SN is unavailable today but requested the input of TP. Pt stated he does not want steroids in any form. Pt stated TP can call if she is needing further s/s.   TP please advise.   Allergies  Allergen Reactions  . Gentamycin [Gentamicin]   . Neosporin [Neomycin-Bacitracin Zn-Polymyx]   . Bacitracin   . Moxifloxacin Other (See Comments)    REACTION: pt had severe red rash- he thought from Avelox Rx....  . Triamcinolone Acetonide Other (See Comments)    Unknown    . Other Other (See Comments)    Redness  bandaide

## 2015-01-16 ENCOUNTER — Other Ambulatory Visit: Payer: Self-pay | Admitting: Neurosurgery

## 2015-01-16 DIAGNOSIS — M5416 Radiculopathy, lumbar region: Secondary | ICD-10-CM

## 2015-01-18 ENCOUNTER — Ambulatory Visit
Admission: RE | Admit: 2015-01-18 | Discharge: 2015-01-18 | Disposition: A | Discharge status: Home / Self Care | Payer: PRIVATE HEALTH INSURANCE | Source: Ambulatory Visit | Attending: Neurosurgery | Admitting: Neurosurgery

## 2015-01-18 DIAGNOSIS — M5416 Radiculopathy, lumbar region: Secondary | ICD-10-CM

## 2015-01-18 MED ORDER — GADOBENATE DIMEGLUMINE 529 MG/ML IV SOLN
16.0000 mL | Freq: Once | INTRAVENOUS | Status: AC | PRN
  Administered 2015-01-18: 16 mL via INTRAVENOUS

## 2015-01-21 ENCOUNTER — Other Ambulatory Visit: Payer: PRIVATE HEALTH INSURANCE

## 2015-02-04 ENCOUNTER — Encounter: Payer: Self-pay | Admitting: Diagnostic Neuroimaging

## 2015-02-04 ENCOUNTER — Ambulatory Visit (INDEPENDENT_AMBULATORY_CARE_PROVIDER_SITE_OTHER): Payer: PRIVATE HEALTH INSURANCE | Admitting: Diagnostic Neuroimaging

## 2015-02-04 VITALS — BP 146/84 | HR 85 | Ht 68.0 in | Wt 174.6 lb

## 2015-02-04 DIAGNOSIS — R208 Other disturbances of skin sensation: Secondary | ICD-10-CM

## 2015-02-04 DIAGNOSIS — R200 Anesthesia of skin: Secondary | ICD-10-CM

## 2015-02-04 NOTE — Progress Notes (Signed)
[facility]  PATIENT: Peter [name] DOB: 1946-08-05  REFERRING CLINICIAN: Nudelman HISTORY FROM: patient and wife  REASON FOR VISIT: new consult    HISTORICAL  CHIEF COMPLAINT:  Chief Complaint  Patient presents with  . 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, here accompanied by his wife for consultation of numbness in his toes and feet since approximately October 2015, at the request of Dr. Sherwood Gambler. Patient has past medical history of osteoarthritis, calcium pyrophosphate deficiency arthropathy, hypertension, cervical spine decompression and fusion C6-7 in 2002, lumbar decompression fusion L3-4 and L4-5 in Dec 2014.   Patient was followed by Dr. 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 Patient was treated conservatively with epidural steroid injections and physical therapy. He followed up with Dr. Jannifer Franklin in Aug 2014, and ultimately was referred back to [facility] for lumbar spine surgical evaluation. He underwent decompression surgery by Dr. Sherwood Gambler in December 2014 with significant improvement in low back pain symptoms, leg pain, muscle spasms, and functional ability. In 2015 he was able to walk up to 2 miles without difficulty.  Towards the end of 2015 patient noticed onset of a numbness sensation in his toes and balls of feet. He feels as though socks are bunched up under his feet. Numbness has minimally progressed over the past 6 months. He denies any pins and needles, tingling, burning or electrical sensation. He denies any pain in his toes or feet. He denies any low back pain or symptoms similar to prior lumbar radiculopathy problem. Occasionally he has some numbness in his right anterolateral thigh region. Symptoms seem to be worse  when he walks or stands for a long time. He no longer feels comfortable walking as long distances as before, mainly out of concern and worry about what is going on with his feet. He is worried that he may be developing nerve damage/neuropathy, which may progress over time leading to disability. He denies any weakness in his legs with walking long distances. However he may have some nonspecific fatigue that prevents him from walking as long as he would like. This was noticed a few months ago when patient and his wife were at the beach and he had to take breaks from their walks.  Patient was evaluated by Dr. Sherwood Gambler recently, and had repeat MRI lumbar spine in March 2016, which showed adequate decompression, multilevel degenerative changes, without any significant neural foraminal or central canal stenosis. Due to the change in symptoms, and relatively unremarkable lumbar spine imaging, consideration for onset of new peripheral neuropathy etiology was raised. Patient referred to me for evaluation of this possibility.  Patient and wife did raise concern about medication side effect as a possible cause for neuropathy. Specifically they were concerned about amlodipine and Crestor. Apparently patient started Crestor 10 years ago out of concern and potential ability to reduce progression to develop Alzheimer's disease. Apparently patient's cholesterol numbers were not "too bad". Patient has discussed this recently with PCP and has stopped Crestor recently.  UPDATE (06/09/13, Dr. 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 The patient has a three-level problem, however. The patient has pseudo-claudication that mainly involves pain into the right hip area with walking greater than 200  yards. The patient will have to stop and rest, and he is able to walk again. The patient also has some stiffness in the back and hip  area of when he first gets up in the morning, but this improves when he moves around and stretches. The patient is exercising 3 times a week, and he does yoga which is beneficial. The patient has lost weight to help his back issues. The patient has had cervical spine surgery previously as well. The patient denies any weakness of the legs, or problems with balance or problems controlling the bowels or the bladder. The patient is on gabapentin and Celebrex, and he has had a recent injection in the left knee for pseudogout. The patient returns for an evaluation. He does not note any decline in his physical abilities over the last several years."  PRIOR HPI (01/04/13, Dr. Erling Cruz): "69 year old right-handed white married male with a history of altered sensation along the bottoms of his feet, worse after lying down. He  also noted leg cramps in his feet and twitching in the left upper arm at the triceps which subsequently resolved. He has muscle twitching  in the lower legs bilaterally. It is worse after lying down.  He has an altered sensation on the bottom of his feet that  is hard to characterize. 3-1/2 years ago after he was hiking with friends and  had a "crawling" sensation in his right lower leg. Thinking there was an insect or bug on his leg, he kicked his right leg with the left, and caused  bleeding of the right leg.There was loss of sensation in the right lower leg which resolved. He discontinued Lipitor 3-1/2 years ago. He has a history of cervical degenerative disc disease causing right  upper chest pain and underwent C6-7 fusion in 2001 by Dr. Sherwood Gambler. He has lumbar degenerative disc disease with MRI of the lumbar spine without contrast 10/12/2010 showing spondylosis worse at L4-5 with severe central canal stenosis and bilateral recess stenosis, right worse than left. He has narrowing of the right lateral recess at L3-4 and moderate central disease and bilateral foraminal  narrowing at L2-3. He has undergone   L5-S1 epidural steroid injections by Dr. Maree Erie 10/13/2010, right L4-5 epidural steroid injection 11/16/2010 by Dr. Patton Salles,  a third epidural without benefit in right hip pain, and an injection into the right  hip 3/15/213 by Dr. Maree Erie with  improvement. His sensation of twitching in his lower extremities got worse after the epidural injections.He has not lost weight or noted focal weakness. He had 2 L5-S1 epidural steroid injections prior to a trip to [facility]. He did a lot of walking and  on return noted fasciculations and cramping  pain occurring in the right at times left anterior tibialis that has improved. He has intermittent numbness left fourth and fifth toe . He will have 2 L5-S1 epidural steroid injections prior to a trip to [place] and 2 more prior to a trip  to [place]. After the L5-S1 injections he lies down on his right side He had a total CPK of  233.  EMG/NCV 01/26/2011 was abnormal demonstrating right L4 and bilateral L5-S1 radiculopathies. Mild spontaneous activity was seen in the right L4-5 paraspinal muscles consistent with nerve root irritation.  No fasciculations were seen.  Left L4-5 paraspinal muscles were normal and  the left arm was normal.  He notices right hip pain while walking without back pain, which stops when he sits down suggestive of neurogenic claudication. His  symptoms definitely improved following a right hip injection 01/08/12. He was able to walk 1 mile. He does not notice the sensation while sitting or lying.  He denies changes in pain with bending over, cough, or sneeze. He has pseudogout and is on colchicine which has been increased to 1 bid. Last blood studies with Dr. Sanda Linger 11/2012, the patient reports were normalHe injured his left rotator cuff and underwent surgery 93/57/01 complicated by allergic reactions to the suture material. He had sutures removed and with a second operation 09/01/11. His left shoulder remained inflamed and he took Keflex for 10 days. He is  followed by a dermatologist, Dr. Sabra Heck at [facility] with allergies to gentamicin, bacitracin, Neosporin, and Polysporin.Marland KitchenHe has a positive family history of Alzheimer's disease."    REVIEW OF SYSTEMS: Full 14 system review of systems performed and notable only for hearing loss ringing in ears joint pain and numbness.  ALLERGIES: Allergies  Allergen Reactions  . Gentamycin [Gentamicin]   . Neosporin [Neomycin-Bacitracin Zn-Polymyx]   . Bacitracin   . Moxifloxacin Other (See Comments)    REACTION: pt had severe red rash- he thought from Avelox Rx....  . Triamcinolone Acetonide Other (See Comments)    Unknown    . Other Other (See Comments)    Redness  bandaide    HOME MEDICATIONS: Outpatient Prescriptions Prior to Visit  Medication Sig Dispense Refill  . ALPRAZolam (XANAX) 0.5 MG tablet Take 1/2 to 1 tablet by mouth three times daily as needed for anxiety 90 tablet 5  . amLODipine (NORVASC) 10 MG tablet TAKE 1 TABLET DAILY 90 tablet 3  . celecoxib (CELEBREX) 200 MG capsule TAKE 1 CAPSULE TWO TIMES A DAY 180 capsule 2  . colchicine 0.6 MG tablet Take 1 tablet (0.6 mg total) by mouth 2 (two) times daily. 180 tablet 3  . cycloSPORINE (RESTASIS) 0.05 % ophthalmic emulsion Place 1 drop into both eyes 2 (two) times daily.    . hydrochlorothiazide (HYDRODIURIL) 25 MG tablet TAKE 1 TABLET DAILY 90 tablet 1  . mometasone (NASONEX) 50 MCG/ACT nasal spray Place 2 sprays into the nose daily. 17 g 11  . montelukast (SINGULAIR) 10 MG tablet Take 1 tablet (10 mg total) by mouth at bedtime. 30 tablet 11  . potassium chloride SA (K-DUR,KLOR-CON) 20 MEQ tablet Take 1 tablet (20 mEq total) by mouth daily. 90 tablet 3  . quinapril (ACCUPRIL) 10 MG tablet TAKE 1 TABLET DAILY 90 tablet 2  . ranitidine (ZANTAC) 150 MG capsule Take 150 mg by mouth 2 (two) times daily.    . rosuvastatin (CRESTOR) 10 MG tablet TAKE 1 TABLET AT BEDTIME (Patient not taking: Reported on 02/04/2015) 90 tablet 1  . azithromycin  (ZITHROMAX) 250 MG tablet Take as directed 6 tablet 2  . hydroxychloroquine (PLAQUENIL) 200 MG tablet Take 200 mg by mouth daily.    . promethazine-codeine (PHENERGAN WITH CODEINE) 6.25-10 MG/5ML syrup Take 5 mLs by mouth every 6 (six) hours as needed for cough. 200 mL 0   No facility-administered medications prior to visit.    PAST MEDICAL HISTORY: Past Medical History  Diagnosis Date  . Allergic rhinitis   . Tinnitus     chronic  . Hypertension   . History of rheumatic fever   . Hypercholesteremia     borderline  . GERD (gastroesophageal reflux disease)   . History of colonic polyps   . DJD (degenerative joint disease)   . History of pseudogout   . Spondylosis   .  Family history of Alzheimer's disease   . History of melanoma   . Chest pain     Nuclear 2005, normal / coronary CTA 2008, slight mixed plaque, coronary calcium score 0.65, ejection fraction 55%, echo, March, 2008  . Ejection fraction     EF 55%, echo, March, 2008.  . Dizziness     Dizziness with question of presyncope April 08, 2011  . Cancer     Melanoma   . Lumbar spondylosis with myelopathy 06/09/2013  . Complication of anesthesia     itching-not sure if oxycodone or anesthesia,and shakes  . Anxiety   . H/O hiatal hernia   . Heart murmur     69 yrs old rheumatic fever   . Heart murmur 2008    trivial tricusp regurg    PAST SURGICAL HISTORY: Past Surgical History  Procedure Laterality Date  . Melanoma excision with sentinel lymph node biopsy  8/02    bilat ing node bx  . Anterior cervical decomp/discectomy fusion  2003  . Rotator cuff repair Left     08/13/11  08/27/2011  . Lumbar fusion  12/14  . Cervical discectomy      anterior with patellar allograft and plating  . Carpal tunnel release Right 10/31/2013    Procedure: RIGHT CARPAL TUNNEL RELEASE;  Surgeon: Cammie Sickle., MD;  Location: [facility];  Service: Orthopedics;  Laterality: Right;  . Carpal tunnel release Left  11/28/2013    Procedure: LEFT CARPAL TUNNEL RELEASE;  Surgeon: Cammie Sickle., MD;  Location: [facility];  Service: Orthopedics;  Laterality: Left;    FAMILY HISTORY: Family History  Problem Relation Age of Onset  . Alzheimer's disease Father   . Alzheimer's disease Mother     SOCIAL HISTORY:  History   Social History  . Marital Status: Married    Spouse Name: Jackelyn Poling  . Number of Children: 2  . Years of Education: MD   Occupational History  . RADIOLOGIST    Social History Main Topics  . Smoking status: Never Smoker   . Smokeless tobacco: Never Used  . Alcohol Use: 0.0 oz/week    2-4 Glasses of wine per week     Comment: 3 x a week  . Drug Use: No  . Sexual Activity: Not on file   Other Topics Concern  . Not on file   Social History Narrative   Married - wife = [name]   2 children- Daughter=lawyer, Son=physician   Non smoker   Exercises 3 times per week   Caffeine use: 2 cups/d   etoh use: 1-2 glasses of wine nightly     PHYSICAL EXAM  Filed Vitals:   02/04/15 0850  BP: 146/84  Pulse: 85  Height: 5\' 8"  (1.727 m)  Weight: 174 lb 9.6 oz (79.198 kg)    Body mass index is 26.55 kg/(m^2).   Visual Acuity Screening   Right eye Left eye Both eyes  Without correction:     With correction: 20/40 20/40     No flowsheet data found.  GENERAL EXAM: Patient is in no distress; well developed, nourished and groomed; neck is supple  CARDIOVASCULAR: Regular rate and rhythm, no murmurs, no carotid bruits  NEUROLOGIC: MENTAL STATUS: awake, alert, oriented to person, place and time, recent and remote memory intact, normal attention and concentration, language fluent, comprehension intact, naming intact, fund of knowledge appropriate CRANIAL NERVE: no papilledema on fundoscopic exam, pupils equal and reactive to light, visual fields  full to confrontation, extraocular muscles intact, no nystagmus, facial sensation and strength symmetric, hearing  intact, palate elevates symmetrically, uvula midline, shoulder shrug symmetric, tongue midline. MOTOR: normal bulk and tone, full strength in the BUE, BLE SENSORY: normal and symmetric to light touch, proprioception; DECR VIB IN TOES (RIGHT TOE 3 SEC, LEFT TOE 8 SEC); DECR PP IN BILATERAL TOES AND FEET, DISTALLY WITH GRADIENT UP TO KNEED, WITH RIGHT FOOT MORE AFFECTED THAN LEFT; DECR TEMP IN FEET COMPARED TO HANDS COORDINATION: finger-nose-finger, fine finger movements normal REFLEXES: BUE TRACE; RIGHT KNEE 0, LEFT KNEE 1, ANKLES 0; MUTE TOES GAIT/STATION: narrow based gait; able to walk on toes, heels and tandem; romberg is negative     DIAGNOSTIC DATA (LABS, IMAGING, TESTING) - I reviewed patient records, labs, notes, testing and imaging myself where available.  Lab Results  Component Value Date   WBC 7.7 09/22/2013   HGB 16.0 11/28/2013   HCT 47.0 11/28/2013   MCV 92.2 09/22/2013   PLT 256 09/22/2013      Component Value Date/Time   NA 141 11/28/2013 0804   K 3.8 11/28/2013 0804   CL 105 11/28/2013 0804   CO2 28 10/27/2013 1200   GLUCOSE 100* 11/28/2013 0804   BUN 16 11/28/2013 0804   CREATININE 1.00 11/28/2013 0804   CREATININE 0.97 10/09/2011 1140   CALCIUM 9.5 10/27/2013 1200   PROT 7.1 10/09/2011 1140   ALBUMIN 4.7 10/09/2011 1140   AST 27 10/09/2011 1140   ALT 37 10/09/2011 1140   ALKPHOS 88 10/09/2011 1140   BILITOT 0.6 10/09/2011 1140   GFRNONAA >90 10/27/2013 1200   GFRAA >90 10/27/2013 1200   Lab Results  Component Value Date   CHOL 228* 04/10/2011   HDL 57.70 04/10/2011   LDLCALC 104* 07/16/2008   LDLDIRECT 151.6 04/10/2011   TRIG 53.0 04/10/2011   CHOLHDL 4 04/10/2011   No results found for: HGBA1C No results found for: VITAMINB12 Lab Results  Component Value Date   TSH 1.18 04/10/2011    01/26/11 EMG/NCS - This is an abnormal study demonstrating electrodiagnostic evidence of bilateral lumbosacral (right L4, bilateral L5 and S1) radiculopathies.   Mild spontaneous activity seen in the right L4-5 paraspinal muscles, consistent with nerve root irritation.  No fasciculations are seen. The left median, left ulnar and bilateral sural sensory responses are normal.    01/19/15 MRI lumbar spine (report only; [facility] PACS not working to [facility]) - Decompression, diskectomy and fusion from L3 through L5 has a good appearance. - Adjacent segment degenerative disease at L2-3 with circumferential protrusion of the disc and facet and ligamentous hypertrophy. Narrowing of the canal with crowding of the nerve roots. There is potential for neural compression at this level. - Worsening of degenerative disc disease at L5-S1, but without visible compressive stenosis of the canal or foramina.    ASSESSMENT AND PLAN  69 y.o. year old male here with history of cervical and lumbar degenerative spine disease, status post cervical and lumbar decompression surgeries. Patient was doing well until 6 months ago, developed onset of numbness in toes and feet. May represent insidious onset of peripheral neuropathy. Pursue additional testing to look for treatable, reversible causes.   Ddx: neuropathy (metabolic, toxic, autoimmune, small fiber/idiopathic) vs lumbar spine dz (L2-3 central canal narrowing at noted above; post-op fibrosis/gliosis; but less likely given clinical course of symptoms)  PLAN: - EMG/NCS to look for evidence of superimposed peripheral neuropathy on prior lumbar radiculopathy - if demyelinating or multi-focal neuropathy found on  EMG/NCS, then will order pertinent lab testing, consider lumbar puncture and also consider possible nerve biopsy; otherwise check general neuropathy lab screening - awaiting access to imaging as [facility] PACS not working currently; will plan to review at time of patient return for EMG/NCS testing  Orders Placed This Encounter  Procedures  . NCV with EMG(electromyography)   Return for for NCV/EMG.    Penni Bombard, MD 02/25/8881, 8:00 AM Certified in Neurology, Neurophysiology and Neuroimaging  [facility] [address] [place], [place] [address] [phone]

## 2015-02-04 NOTE — Patient Instructions (Signed)
I will check EMG/NCS.  Reduce alcohol use.

## 2015-02-06 ENCOUNTER — Telehealth: Payer: Self-pay | Admitting: *Deleted

## 2015-02-06 NOTE — Telephone Encounter (Signed)
Spoke with the pt on the phone and explained about what Dr. Leta Baptist said and he asked me to have Dr. Leta Baptist call him and discuss things with him since he was feeling frustrated with things. Will inform Dr. Leta Baptist

## 2015-02-06 NOTE — Telephone Encounter (Signed)
Pt called me this am and asked if we could get lab work done today through [facility] next to his work so that he could discuss things with Dr. Leta Baptist at the [place] study appt.   I spoke with Dr. Leta Baptist who said he wanted to do the labs after the appt so that he could determine what he needed to order from the study. Asked me to call the pt back.

## 2015-02-21 ENCOUNTER — Ambulatory Visit (INDEPENDENT_AMBULATORY_CARE_PROVIDER_SITE_OTHER): Payer: PRIVATE HEALTH INSURANCE | Admitting: Diagnostic Neuroimaging

## 2015-02-21 ENCOUNTER — Encounter (INDEPENDENT_AMBULATORY_CARE_PROVIDER_SITE_OTHER): Payer: Self-pay | Admitting: Radiology

## 2015-02-21 DIAGNOSIS — M4807 Spinal stenosis, lumbosacral region: Secondary | ICD-10-CM

## 2015-02-21 DIAGNOSIS — R200 Anesthesia of skin: Secondary | ICD-10-CM

## 2015-02-21 DIAGNOSIS — G609 Hereditary and idiopathic neuropathy, unspecified: Secondary | ICD-10-CM

## 2015-02-21 DIAGNOSIS — Z0289 Encounter for other administrative examinations: Secondary | ICD-10-CM

## 2015-02-22 ENCOUNTER — Other Ambulatory Visit: Payer: Self-pay | Admitting: Diagnostic Neuroimaging

## 2015-02-22 DIAGNOSIS — G609 Hereditary and idiopathic neuropathy, unspecified: Secondary | ICD-10-CM

## 2015-02-23 NOTE — Procedures (Signed)
   [facility]  NCS (NERVE CONDUCTION STUDY) WITH EMG (ELECTROMYOGRAPHY) REPORT   STUDY DATE: 02/21/15 PATIENT NAME: Peter [name] DOB: 1945/12/14 MRN: [id number]  ORDERING CLINICIAN: Andrey Spearman, MD  TECHNOLOGIST: Towana Badger  ELECTROMYOGRAPHER: Earlean Polka. Alyzza Andringa, MD  CLINICAL INFORMATION: 69 year old male here for evaluation of lower extremity numbness. Evaluate for neuropathy versus lumbar radiculopathy.  FINDINGS: NERVE CONDUCTION STUDY: Right peroneal motor response has normal distal latency, decreased amplitude, normal conduction velocity and prolonged F-wave latency. Left peroneal motor response is normal distal latency, decreased amplitude, slow conduction velocity above-the-knee, normal conduction velocity below the knee, prolonged F-wave latency. Right tibial motor response and F-wave latency are normal. Left tibial motor response is normal. Left tibial F wave latency is prolonged.  Right sural sensory response has decreased amplitude and slow conduction velocity. Left sural sensory response has decreased amplitude and normal conduction velocity.   NEEDLE ELECTROMYOGRAPHY: Needle examination of left lower extremity vastus medialis, tibialis anterior, gastrocnemius is normal. Left L2-3 and L5-S1 paraspinal muscles and straight 1+ positive sharp waves and fibrillation potentials.   IMPRESSION:  Abnormal study demonstrate: 1. Evidence of bilateral lumbar radiculopathies based on nerve conduction and needle EMG findings. 2. 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     INTERPRETING PHYSICIAN:  Penni Bombard, MD Certified in Neurology, Neurophysiology and Neuroimaging  [facility] [address] [place], [place] [address] [phone]

## 2015-02-26 LAB — NEUROPATHY PANEL
A/G Ratio: 1.6 (ref 0.7–2.0)
Albumin ELP: 4.1 g/dL (ref 3.2–5.6)
Alpha 1: 0.2 g/dL (ref 0.1–0.4)
Alpha 2: 0.6 g/dL (ref 0.4–1.2)
Angio Convert Enzyme: <14 U/L — ABNORMAL LOW (ref 14–82)
Anti Nuclear Antibody(ANA): NEGATIVE
Beta: 1 g/dL (ref 0.6–1.3)
Gamma Globulin: 0.8 g/dL (ref 0.5–1.6)
Globulin, Total: 2.6 g/dL (ref 2.0–4.5)
Rhuematoid fact SerPl-aCnc: 8.8 IU/mL (ref 0.0–13.9)
TSH: 2.11 u[IU]/mL (ref 0.450–4.500)
Total Protein: 6.7 g/dL (ref 6.0–8.5)
Vit D, 25-Hydroxy: 39.6 ng/mL (ref 30.0–100.0)
Vitamin B-12: 885 pg/mL (ref 211–946)

## 2015-02-26 LAB — B. BURGDORFI ANTIBODIES: Lyme IgG/IgM Ab: <0.91 {ISR} (ref 0.00–0.90)

## 2015-02-26 LAB — VITAMIN B1: Thiamine: 158.9 nmol/L (ref 66.5–200.0)

## 2015-03-11 ENCOUNTER — Other Ambulatory Visit: Payer: Self-pay | Admitting: Pulmonary Disease

## 2015-03-11 MED ORDER — HYDROCHLOROTHIAZIDE 25 MG PO TABS: 25.0000 mg | ORAL_TABLET | Freq: Every day | ORAL | Status: DC

## 2015-03-20 ENCOUNTER — Other Ambulatory Visit: Payer: Self-pay | Admitting: Neurosurgery

## 2015-03-20 DIAGNOSIS — G8929 Other chronic pain: Secondary | ICD-10-CM

## 2015-03-20 DIAGNOSIS — M545 Low back pain, unspecified: Secondary | ICD-10-CM

## 2015-03-26 ENCOUNTER — Other Ambulatory Visit: Payer: Self-pay | Admitting: Pulmonary Disease

## 2015-03-26 MED ORDER — CELECOXIB 200 MG PO CAPS: ORAL_CAPSULE | ORAL | Status: DC

## 2015-03-26 MED ORDER — QUINAPRIL HCL 10 MG PO TABS: 10.0000 mg | ORAL_TABLET | Freq: Every day | ORAL | Status: DC

## 2015-03-28 ENCOUNTER — Other Ambulatory Visit: Payer: Self-pay | Admitting: Emergency Medicine

## 2015-03-28 MED ORDER — CELECOXIB 200 MG PO CAPS: ORAL_CAPSULE | ORAL | Status: DC

## 2015-04-03 ENCOUNTER — Other Ambulatory Visit: Payer: PRIVATE HEALTH INSURANCE

## 2015-04-22 ENCOUNTER — Encounter: Payer: Self-pay | Admitting: Pulmonary Disease

## 2015-04-22 ENCOUNTER — Ambulatory Visit (INDEPENDENT_AMBULATORY_CARE_PROVIDER_SITE_OTHER): Payer: PRIVATE HEALTH INSURANCE | Admitting: Pulmonary Disease

## 2015-04-22 VITALS — BP 134/74 | HR 80 | Temp 97.8°F | Resp 16 | Ht 70.0 in | Wt 172.0 lb

## 2015-04-22 DIAGNOSIS — M6289 Other specified disorders of muscle: Secondary | ICD-10-CM

## 2015-04-22 DIAGNOSIS — M629 Disorder of muscle, unspecified: Secondary | ICD-10-CM | POA: Insufficient documentation

## 2015-04-22 NOTE — Patient Instructions (Signed)
Today we updated your med list in our EPIC system...    Continue your current medications the same...  Watch the area of the knot in your anterior right thigh & call for any changes...  Dr. Charlann Boxer recommends a recheck ultrasound eval in about 6 weeks.Marland KitchenMarland Kitchen

## 2015-04-22 NOTE — Progress Notes (Addendum)
Subjective:    Patient ID: Peter [name], male    DOB: 08-28-1946, 69 y.o.   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  see prob list below:  ~  June 20, 2010:  Add-on today at pt request for intermittent R>L hip area pain, worse recently & Rx w/ OTC Ibuprofen w/ some help... no prev ortho problems, but mild OA apparent on exam fingers etc... exam shows good ROM w/o pain, non-tender, no skin lesions, etc... we discussed Rx w/ MOBIC, check XRays, & next step= Ortho eval for poss shot in the area.  ~  April 24, 2011:  12mo ROV & CPX> Peter [name] has had a number of medical complaints as outlined in his accompanying note> wife was concerned all this was having a toll & that Acen might be depressed, we discussed this & decided to hold off on any new meds at this time...    He had dizziness episode, near syncope, & saw [facility] 6/12 for Cards eval> EKG NSR, WNL; CDopplers w/ min plaque, no signif stenoses; he did not feel that further eval was warranted...    He's had an excellent Rheum eval from [facility] w/ OA & CPPD> on Colcrys 1.5 tabs daily, plus Celebrex 200mg  Bid prn... He's had Dupytren's injection in hands from DrSypher as well and some triggering of the right 4th digit...    Peter [name] noted muscle fasciculations & had a thorough eval from [place] [facility] (we don't have his notes)> pt indicates that they are felt to be secondary to his lumbar spondylosis (3 levels) for which he has had several epidural steroid shots & has been checked by [facility]; EMG/ NCV were normal & no signs of upper motor neuron disease...  CXR 9/09 showed clear, WNL;  EKG= NSR, WNL;  LABS= FLP off Statin showed TChol 228 & LDL 152; Chems all wnl x LFTs borderline elev; CBC wnl; TSH/ PSA/ Sed all wnl...  ~  April 11, 2012:  Yearly ROV & CPX>  Peter [name] has had a number of issues this past yr- mostly of an Orthopedic/ Neurological/ Allergic nature and outlined below...    No further severe dizzy spells/ near syncope;  BP remains stable, no chest pain/ angina, etc;  He tried off Statins for some time, no change in his status therefore restarted Crestor10mg /d...    Ortho problems included left rotator cuff tear and surg by DrWhitfield 44/03 complicated by an unusual allergic reaction to the suture material w/ erythema in skin and a blister, he took antibiotics & was seen by ID, required a second operation w/ sutures changed 11/12, and a subspecialty Derm eval at [facility] showing allergic reactions to Gentamicin, Bacitracin, Neosporin, Polysporin, Iodine, Triamcinolone...    He continues to f/u w/ DrHawkes for Rheum- CPPD/Pseudogout on Colchicine 0.6mg Bid & Celebrex 200mg  1-2 daily & tolerated well...    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    We reviewed prob list, meds, xrays and labs> see below>>  CXR 6/13 showed normal heart size, clear lungs, radiopaque sutures seen in left shoulder, distal clav resection, lower CSpine  plate & screws, mild thoracic spondylosis...  ~  April 20, 2014:  67yr ROV & CPX>  Peter [name] has gone part time for [employer] [facility] & he is doing better overall; continued Orthopedic & Rheum issues as outlined below;  We reviewed the following medical problems during today's office visit >>     ALLERGIES> he has AR and demonstrated allergic reaction to suture material used on his left shoulder surg w/ erythema in skin and a blister; he reports Derm eval at [facility] showing allergic reactions to Gentamicin, Bacitracin, Neosporin, Polysporin, Iodine, & Triamcinolone...     HEARING LOSS> he has bilat hearing aides from [employer]...     HBP> on Amlod10, Accupril10, HCT25, K20; BP= 132/70 and he denies CP, palpit, SOB, edema; he exercises at the gym & walking; not yet back to playing golf due to his back surg...     Hx Rheumatic fever age14, prob pericarditis in 2008> see results of prev evaluations w/ Myoview, CoronaryCTA, 2DEcho, CDopplers below...     CHOL> on Crestor10; FLP done 4/15 [facility] showed TChol 174, TG 62, HDL 60, LDL 102...    GI- GERD, Polyps> on Zantac150Bid; followed by [facility], last colonoscopy was 5/11- neg, no recurrent polyps & last polyp removed 4/04= hyperplastic...     ORTHO- DJD, CPPD, Hx left rotator cuff repair 10/12 by DrWhitfield, CSpine surg 2003 by [facility], Lumbar spine decompression & 2 level fusion 12/14 by [facility] (for spinal stenosis & myelopathy- improved), s/p bilat carpal tunnel releases by DrSypher 1/15 & 2/15...  He has resid LBP, DJD & pseudogout symptoms- on Celebrex200Bid, Colchicine0.6Bid, & Plaquenil200 started by rheum= Dr. Berenda Morale at [facility]...     Hx Melanoma> level 2 SSM removed from abd wall 2002, followed by Derm- Dr. Milagros Reap at [facility]... No known recurrence...    Fam Hx alzheimer's dis> both parents had severe AD...  We reviewed prob list, meds, xrays and labs> see below for updates >>   CXR 11/14 showed norm heart size, clear lungs, NAD; plate & screw fixation device in lower Cspine, sutures seen over the left shoulder, s/p resection of the distal left clavicle, DDD in TSpine...   EKG 11/14 showed SBrady, rate58, wnl, NAD...   LABS 4/15:  FLP- at goals on Cres10 x LDL=102;  Chems- wnl;  CBC- wnl;  TSH=1.72;  PSA= 1.74...  BONE DENSITY 2/15:  Normal w/ lowest Tscore= -0.5 in left femoral neck...   ~  April 22, 2015:  48yr ROV & add-on appt requested for painless knot in right mid thigh anteriorly>  He noticed this abn recently while wearing shorts and rubbing his leg; no skin lesion & knot not visible on inspection; no known trauma or bruising; he has not exercised in  quite awhile due to his severe pseudogout/CPPD; palpation of the area yields an oblong knot under the skin in upper/mid thigh w/ palp extension prox & distally that indicates the lesion is in the rectus femoris muscle;  It is totally non-tender;  On the left leg one can feel the rectus femoris muscle w/o this knot like area present...    I asked Dr. Charlann Boxer to check this lesion w/ his ultrasound machine & it was his opinion that this looked most like a sm hematoma in the muscle=> recommendation to follow this clinically & recheck in about 6 weeks.     We reviewed the following medical problems during today's office visit >> this visit will serve as his yearly physical as well >>  ALLERGIES> he has AR and demonstrated allergic reaction to suture material used on his left shoulder surg w/ erythema in skin and a blister; he reports Derm eval at [facility] showing allergic reactions to Gentamicin, Bacitracin, Neosporin, Polysporin, Iodine, & Triamcinolone...     HEARING LOSS> he has bilat hearing aides from [employer]...    HBP> on Amlod10, Accupril10, HCT25, K20; BP= 134/74 and he denies CP, palpit, SOB, edema; he was exercises at the gym & walking; we discussed returning to exercise program...    Hx Rheumatic fever age14, prob pericarditis in 2008> see results of prev evaluations w/ Myoview, CoronaryCTA, 2DEcho, CDopplers below...     CHOL> on Crestor10; FLP done 3/16 [facility] showed TChol 169, TG 79, HDL 63, LDL 90...    GI- GERD, Polyps> on Zantac150Bid; followed by [name], last colonoscopy was 5/11- neg, no recurrent polyps & last polyp removed 4/04= hyperplastic...     ORTHO- DJD, CPPD, Hx left rotator cuff repair 10/12 by DrWhitfield, CSpine surg 2003 by [facility], Lumbar spine decompression & 2 level fusion 12/14 by [facility] (for spinal stenosis & myelopathy- improved), s/p bilat carpal tunnel releases by DrSypher 1/15 & 2/15...  He has resid LBP, DJD & pseudogout symptoms- on  Celebrex200Bid, Colchicine0.6Bid, & off Plaquenil prev rx by Rheum= Dr. Berenda Morale at [facility]...     Hx Melanoma> level 2 SSM removed from abd wall 2002, followed by Derm- Dr. Milagros Reap at [facility]... No known recurrence...    Fam Hx alzheimer's dis> both parents had severe AD...  We reviewed prob list, meds, xrays and labs> see below for updates >>   LABS 3/16 showed FLP- at goals on Cres10;  Chems- wnl;  CBC- wnl;  TSH=2.74;  PSA=2.68  [employer] required biometric screening>    Height= 5' 9.5"  Weight= 171 lbs  Waist=34"  Neck circumference= 16"  Body Fat Calculation= 19%   BP= 134/74   BS= 98 and no need for HgA1c measurement w/ all normal blod sugars   FLP= TChol 169, TG 79, HDL 63, LDL 90  ADDENDUM>> MRI right thigh area 04/26/15 rec by DrMaxwell> Focal area of thickening of the rectus femoris tendon in the anterior aspect of the mid right thigh with an area of fatty atrophy surrounding the area tendon thickening consistent with remote trauma. There are small calcifications in the tendon distal to this area of fatty atrophy, demonstrated on informal ultrasound examination...    Problem List:  ALLERGIC RHINITIS (ICD-477.9) - eval by DrKozlow et al in 2006... seasonal symptoms treated w/ OTC meds as needed... prev on SINGULAIR 10mg  prn, and NASONEX 2sp Qhs... ~  Note: he had allergic reaction to suture material after left rotator cuff surg 10/12; Specialty Derm eval at [facility] showed allergic to Gentamicin, Bacitracin, Neosporin, Polysporin, Iodine, Triamcinolone...  TINNITUS, CHRONIC - eval by DrKraus in the past... chronic symptom- deals with it effectively using white noise etc... HEARING LOSS - he now has bilat hearing aides and doing better...  HYPERTENSION (ICD-401.9) - controlled on meds- ASA 81mg /d,  NORVASC 10mg /d,  ACCUPRIL 10mg /d,  HCTZ 25mg /d,  KCl 79mEq/d...   ~  6/12:  BP= 134/84 today, takes meds regularly & tol well w/o side effects... BP checks at home and work are similar...  denies HA, fatigue, visual changes, CP, palipit, syncope, dyspnea, edema, etc...  ~  6/13:  BP= 138/76 & he remains asymptomatic w/o CP, palpit, SOB, edema, etc... ~  CXR 11/14 showed norm heart size, clear lungs, post op  ortho surg changes...  ~  6/15:  BP= 132/70 & he denies any CP, palpit, SOB, edema, etc... ~  6/16:  BP= 134/74 & he remains asymptomatic on  Amlod10, Accupril10, HCT25, K20...  Hx of RHEUMATIC FEVER (ICD-390) - age 67, hosp for 3 days- no known sequelae; eval by cardiology in the 80's & no valvular heart disease...  Hx of CHEST PAIN w/ neg cardiac eval in 2008... EKG is normal> Episode of DIZZINESS/ Presyncope 6/12 w/ eval by Benay Spice... ~  Cardiolite in 2005 was neg- no ischemia & EF=60%...  ~  he was hosp w/ CP episode 3/08 by DrBBrodie- felt to be prob pericarditis...  ~  Coronary CTA in 2008 showing sl plaque at an LAD branch vessel (D2), mixed plaque in CIRC, no plaque in prox-mid RCA; Calcium score 0.65 (<25th percentlie)...  ~  2DEcho 3/08 showed no signif valvular lesions, LVwall thickness at upper limit, no regional wall motion abn, LVF= 50-55%... ~  CDopplers 6/12 showed min plaque, no signif ICA stenoses... ~  6/13:  No recurrence of the severe dizziness, near syncope, etc... ~  EKG 11/14 showed SBrady, rate58, wnl, NAD.  VENOUS INSUFFIC >> he has chr ven insuffic changes in LEs but no signif edema on the HCT & low salt diet...   HYPERCHOLESTEROLEMIA, BORDERLINE (ICD-272.4) - he started taking Lipitor prev due to reports of poss benefits in Alzheimer's prevention; Prev on LIPITOR 40mg /d and LOVAZA 4gm/d; we stopped the Statin Rx w/ his fasciculations & other symptoms but there was no change off the medication; f/u FLP 6/12 w/ LDL 152 & we decided to try CRESTOR 10mg /d as a trial. ~  FLP 4/08 on Lipitor40 showed TChol 138, TG 45, HDL 49, LDL 80 ~  FLP 9/09 on ?diet alone showed TChol 171, TG 54, HDL 56, LDL 104 ~  FLP 3/11 on Lip40+Lovaza showed TChol 124, TG 51, HDL  63, LDL 51 ~  FLP 6/12 off Lip40 on Lovaza showed TChol 228, TG 53, HDL 58, LDL 152... rec Cres10. ~  FLP 3/13 on Cres10 ([facility]) showed TChol 165, TG 57, HDL 65, LDL 89... Continue same. ~  [place]man 4/15 on Cres10 ([facility] labs) showed TChol 174, TG 62, HDL 60, LDL 102 ~  FLP 3/16 on Cres10 ([facility] labs) showed  TChol 169, TG 79, HDL 63, LDL 90  GERD (ICD-530.81) - he has been evaluated by [facility]... Prev on PROTONIX but now uses ZANTAC 150Bid...   COLONIC POLYPS, HX OF (ICD-V12.72) - colonoscopy 4/04 by [facility] w/ diminutive hyperplastic polyp removed & sm int hem...   ~  f/u colonoscopy done 5/11 was neg- no polyps...  DEGENERATIVE JOINT DISEASE (ICD-715.90)> on CELEBREX 200mg  Bid... Hx of PSEUDOGOUT (ICD-275.49) - he has hx CPPD w/ eval by Rheum here, [facility], & [facility]> Rx w/ COLCRYS 0.6mg  Bid... ~  8/11: presented w/ c/o R>L hip pain, XRays showed degen arthritis & mild chondrocalcinosis. ~  Subsequent evals by Rheum- DrHawkes & [facility] confirmed CPPD ~  He has right hip area pain> from lumbar spondylosis & improved after several ESI injections... ~  s/p bilat carpal tunnel releases by DrSypher 1/15 & 2/15... ~  6/15: now followed by Dr. Berenda Morale at [facility] on PLAQUENIL 200mg /d... ~  6/16: he remains on Celebrex200Bid, colchicine 0.6Bid, & off the Plaquenil now...  LEFT SHOULDER ROTATOR CUFF TEAR >> he is s/p left shoulder surg 10/12 by DrWhitfield, followed by post op left shoulder inflammation- ?infectious (none found), ?allergic reaction to suture material,  required second operation 11/12 to remove sutures & replace w/ stainless steel sutures==> all resolved now, operative notes reviewed...  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 also lumbar spondy w/ spurs at L4-5 seen on prev CTAbd done 12/06...  subseq MRI Lumbar area w/ multilevel spondylosis, mod central canal stenosis & 3 level lateral recess stenosis> several epid steroid shots by IR w/ partial relief... ~  6/13:  He reports being on GABAPENTIN 300mg  Tid per DrLove... ~  12/14:  He had Lumbar spine decompression & 2 level fusion by DrNudelman (for spinal stenosis & myelopathy- improved)...  Hx of MELANOMA (ICD-172.9) - hx of level II SSMelanoma 0.36mm thick removed from abd wall (just to the right of navel) w/ wide excision and neg sentinel node biopsy by DrPYoung 8/02 (bilat inguinal node surg)... followed closely by DrPatMauro at [facility] & no known recurrence (switching to [facility]- DrJones)...  ALZHEIMER'S DISEASE, FAMILY HX (ICD-V17.0) - family hx of Alzheimer's disease in both his mother and father (both died in their 31's w/ this disease)...  R/O Depression>  6/12 wife shared her concern for Peter [name]'s mood esp as it regards his medical illnesses; after 3way discussion we decided to hold off on any med Rx but will reconsider if needed in follow up... ~  6/13:  He reports doing better after his surgeries, ESI shots, Derm/Allergy eval at [facility], etc;  He is getting more exercise & played 9holes of golf recently...  DERMATOLOGY/ ALLERGY TESTING at [facility] >> pages supplied by DrBarry scanned into the EPIC EMR for reference- +reaction to Triamcinolone (should avoid Budesonide, Flunisolide, Fluocinolone, hydrocortisone)...   Past Medical History  Diagnosis Date  . Allergic rhinitis   . Tinnitus     chronic  . Hypertension   . History of rheumatic fever   . Hypercholesteremia     borderline  . GERD (gastroesophageal reflux disease)   . History of colonic polyps   . DJD (degenerative joint disease)   . History of pseudogout   . Spondylosis   . Family history of Alzheimer's disease   . History of melanoma   . Chest pain     Nuclear 2005, normal / coronary CTA 2008, slight mixed plaque, coronary calcium score 0.65, ejection  fraction 55%, echo, March, 2008  . Ejection fraction     EF 55%, echo, March, 2008.  . Dizziness     Dizziness with question of presyncope April 08, 2011  . Cancer     Melanoma   . Lumbar spondylosis with myelopathy 06/09/2013  . Complication of anesthesia     itching-not sure if oxycodone or anesthesia,and shakes  . Anxiety   . H/O hiatal hernia   . Heart murmur     69 yrs old rheumatic fever   . Heart murmur 2008    trivial tricusp regurg    Past Surgical History  Procedure Laterality Date  . Melanoma excision with sentinel lymph node biopsy  8/02    bilat ing node bx  . Anterior cervical decomp/discectomy fusion  2003  . Rotator cuff repair Left     08/13/11  08/27/2011  . Lumbar fusion  12/14  . Cervical discectomy      anterior with patellar allograft and plating  . Carpal tunnel release Right 10/31/2013    Procedure: RIGHT CARPAL TUNNEL  RELEASE;  Surgeon: Cammie Sickle., MD;  Location: [place];  Service: Orthopedics;  Laterality: Right;  . Carpal tunnel release Left 11/28/2013    Procedure: LEFT CARPAL TUNNEL RELEASE;  Surgeon: Cammie Sickle., MD;  Location: [place];  Service: Orthopedics;  Laterality: Left;    Outpatient Encounter Prescriptions as of 04/22/2015  Medication Sig  . ALPRAZolam (XANAX) 0.5 MG tablet Take 1/2 to 1 tablet by mouth three times daily as needed for anxiety  . amLODipine (NORVASC) 10 MG tablet TAKE 1 TABLET DAILY  . aspirin 81 MG tablet Take 81 mg by mouth daily.  Marland Kitchen buPROPion (WELLBUTRIN) 75 MG tablet Take 75 mg by mouth 2 (two) times daily.  . celecoxib (CELEBREX) 200 MG capsule TAKE 1 CAPSULE TWO TIMES A DAY  . colchicine 0.6 MG tablet Take 1 tablet (0.6 mg total) by mouth 2 (two) times daily.  . cycloSPORINE (RESTASIS) 0.05 % ophthalmic emulsion Place 1 drop into both eyes 2 (two) times daily.  . hydrochlorothiazide (HYDRODIURIL) 25 MG tablet Take 1 tablet (25 mg total) by mouth daily.  . mometasone  (NASONEX) 50 MCG/ACT nasal spray Place 2 sprays into the nose daily.  . montelukast (SINGULAIR) 10 MG tablet Take 1 tablet (10 mg total) by mouth at bedtime.  . potassium chloride SA (K-DUR,KLOR-CON) 20 MEQ tablet Take 1 tablet (20 mEq total) by mouth daily.  . quinapril (ACCUPRIL) 10 MG tablet Take 1 tablet (10 mg total) by mouth daily.  . ranitidine (ZANTAC) 150 MG capsule Take 150 mg by mouth 2 (two) times daily.  . traMADol (ULTRAM) 50 MG tablet Take 1-2 mg by mouth daily.  . rosuvastatin (CRESTOR) 10 MG tablet TAKE 1 TABLET AT BEDTIME (Patient not taking: Reported on 04/22/2015)   No facility-administered encounter medications on file as of 04/22/2015.    Allergies  Allergen Reactions  . Gentamycin [Gentamicin]   . Neosporin [Neomycin-Bacitracin Zn-Polymyx]   . Bacitracin   . Moxifloxacin Other (See Comments)    REACTION: pt had severe red rash- he thought from Avelox Rx....  . Triamcinolone Acetonide Other (See Comments)    Unknown    . Other Other (See Comments)    Redness  bandaide    Current Medications, Allergies, Past Medical History, Past Surgical History, Family History, and Social History were reviewed in [employer] record.   Review of Systems         See HPI - all other systems neg except as noted...  The patient denies anorexia, fever, weight loss, weight gain, vision loss, decreased hearing, hoarseness, chest pain, syncope, dyspnea on exertion, peripheral edema, prolonged cough, headaches, hemoptysis, abdominal pain, melena, hematochezia, severe indigestion/heartburn, hematuria, incontinence, muscle weakness, suspicious skin lesions, transient blindness, difficulty walking, depression, unusual weight change, abnormal bleeding, enlarged lymph nodes, and angioedema.     Objective:   Physical Exam     WD, WN, 69 y/o WM in NAD... Vital Signs:  Reviewed... GENERAL:  Alert & oriented; pleasant & cooperative... HEENT:  [place]/AT, EOM-wnl, PERRLA,  EACs-clear, TMs-wnl, NOSE-clear, THROAT-clear & wnl. NECK:  Supple w/ fairROM s/p surg; no JVD; normal carotid impulses w/o bruits; no thyromegaly or nodules palpated; no lymphadenopathy. CHEST:  Clear to P & A; without wheezes/ rales/ or rhonchi. HEART:  Regular Rhythm; without murmurs/ rubs/ or gallops. ABDOMEN:  Soft & nontender; right peri-umbil scar of melanoma surg; normal bowel sounds; no organomegaly or masses palpated. (RECTAL:  Neg - prostate 2+ & nontender  w/o nodules; stool hematest neg; groin lymphoceles) EXT:  mild OA fingers- sl decr ROM hips/ knees; no varicose veins/ +venous insuffic/ no edema; pulses intact & WNL. NEURO:  CN's intact; motor testing normal; sensory testing normal; gait normal & balance OK; scar of prev Lumbar & CSpine surg... DERM:  No lesions noted; scar left shoulder & scar to right of umbilicus.  RADIOLOGY DATA:  Reviewed in the EPIC EMR & discussed w/ the patient...  LABORATORY DATA:  Reviewed in the EPIC EMR & discussed w/ the patient...   Assessment & Plan:    Small knot in ant mid right thigh, appears to be in the rectus femoris muscle, ultrasound by DrZSmith favors a small hematoma...    REC to follow this lesion clinically & recheck the sonar in about 6 weeks...   HBP>  BP controlled on meds, continue same...  CARDIAC EVAL by Benay Spice in 2012 as outlined above... Pt is reassured...  HYPERCHOLESTEROLEMIA>  His LDL off Lipitor was 152 & he started on Crestor10 and f/u FLP looks good...  GI>  GERD, Polyps>  Stable on Zantac & up to date on screening...  RHEUM>  OA, CPPD>  Followed by Kathlynn Grate on Celebrex & Colcrys...  Hx CSpine fusion, LBP w/ decompression & fusion>  Known spondylosis & s/p several epidural steroid shots, ultimately had surg 12/14 by [facility] & improved...  S/P LEFT SHOULDER SURG w/ Complications>  As outlined above; he is finally better, w/ good ROM.Marland KitchenMarland Kitchen  Hx of Muscle Fasciculations>  He's had a thorough Neuro eval from  [facility] w/ neg EMG/ NCV (no evid for upper motor neuron dis) & he is reassured...  ALLERGIC REACTION TO SUTURE MATERIAL>  This occurred after his left rotator cuff surg 10/12 7 required re-operation & chanfe to stainless steel wire sutures 11/12...  DERMATOLOGY Eval at [facility]>  Hx Melanoma w/o recurrence; [facility] eval w/ finding of reactions to Gentamicin, Bacitracin, Neosporin, Polysporin, Iodine, Triamcinolone, Gold, others...   Patient's Medications  New Prescriptions   No medications on file  Previous Medications   ALPRAZOLAM (XANAX) 0.5 MG TABLET    Take 1/2 to 1 tablet by mouth three times daily as needed for anxiety   AMLODIPINE (NORVASC) 10 MG TABLET    TAKE 1 TABLET DAILY   ASPIRIN 81 MG TABLET    Take 81 mg by mouth daily.   BUPROPION (WELLBUTRIN) 75 MG TABLET    Take 75 mg by mouth 2 (two) times daily.   CELECOXIB (CELEBREX) 200 MG CAPSULE    TAKE 1 CAPSULE TWO TIMES A DAY   COLCHICINE 0.6 MG TABLET    Take 1 tablet (0.6 mg total) by mouth 2 (two) times daily.   CYCLOSPORINE (RESTASIS) 0.05 % OPHTHALMIC EMULSION    Place 1 drop into both eyes 2 (two) times daily.   HYDROCHLOROTHIAZIDE (HYDRODIURIL) 25 MG TABLET    Take 1 tablet (25 mg total) by mouth daily.   MOMETASONE (NASONEX) 50 MCG/ACT NASAL SPRAY    Place 2 sprays into the nose daily.   MONTELUKAST (SINGULAIR) 10 MG TABLET    Take 1 tablet (10 mg total) by mouth at bedtime.   POTASSIUM CHLORIDE SA (K-DUR,KLOR-CON) 20 MEQ TABLET    Take 1 tablet (20 mEq total) by mouth daily.   QUINAPRIL (ACCUPRIL) 10 MG TABLET    Take 1 tablet (10 mg total) by mouth daily.   RANITIDINE (ZANTAC) 150 MG CAPSULE    Take 150 mg by mouth 2 (two) times daily.  ROSUVASTATIN (CRESTOR) 10 MG TABLET    TAKE 1 TABLET AT BEDTIME   TRAMADOL (ULTRAM) 50 MG TABLET    Take 1-2 mg by mouth daily.  Modified Medications   No medications on file  Discontinued Medications   No medications on file

## 2015-04-24 ENCOUNTER — Other Ambulatory Visit: Payer: Self-pay

## 2015-04-24 DIAGNOSIS — M25551 Pain in right hip: Secondary | ICD-10-CM

## 2015-04-26 ENCOUNTER — Ambulatory Visit
Admission: RE | Admit: 2015-04-26 | Discharge: 2015-04-26 | Disposition: A | Discharge status: Home / Self Care | Payer: PRIVATE HEALTH INSURANCE | Source: Ambulatory Visit | Attending: Pulmonary Disease | Admitting: Pulmonary Disease

## 2015-04-26 DIAGNOSIS — M25551 Pain in right hip: Secondary | ICD-10-CM

## 2015-04-26 MED ORDER — GADOBENATE DIMEGLUMINE 529 MG/ML IV SOLN
15.0000 mL | Freq: Once | INTRAVENOUS | Status: AC | PRN
  Administered 2015-04-26: 15 mL via INTRAVENOUS

## 2015-05-27 ENCOUNTER — Other Ambulatory Visit: Payer: Self-pay | Admitting: *Deleted

## 2015-05-27 MED ORDER — AMLODIPINE BESYLATE 10 MG PO TABS: 10.0000 mg | ORAL_TABLET | Freq: Every day | ORAL | Status: DC

## 2015-05-27 MED ORDER — COLCHICINE 0.6 MG PO TABS: 0.6000 mg | ORAL_TABLET | Freq: Two times a day (BID) | ORAL | Status: DC

## 2015-05-28 ENCOUNTER — Encounter: Payer: Self-pay | Admitting: Pulmonary Disease

## 2015-05-29 ENCOUNTER — Other Ambulatory Visit: Payer: Self-pay | Admitting: Pulmonary Disease

## 2015-05-29 ENCOUNTER — Other Ambulatory Visit (INDEPENDENT_AMBULATORY_CARE_PROVIDER_SITE_OTHER): Payer: PRIVATE HEALTH INSURANCE

## 2015-05-29 ENCOUNTER — Encounter: Payer: Self-pay | Admitting: Pulmonary Disease

## 2015-05-29 DIAGNOSIS — I209 Angina pectoris, unspecified: Secondary | ICD-10-CM | POA: Diagnosis not present

## 2015-05-29 LAB — BASIC METABOLIC PANEL
BUN: 23 mg/dL (ref 6–23)
CO2: 31 mEq/L (ref 19–32)
Calcium: 9.7 mg/dL (ref 8.4–10.5)
Chloride: 101 mEq/L (ref 96–112)
Creatinine, Ser: 1.02 mg/dL (ref 0.40–1.50)
GFR: 77.36 mL/min (ref >60.00)
Glucose, Bld: 95 mg/dL (ref 70–99)
Potassium: 4.8 mEq/L (ref 3.5–5.1)
Sodium: 139 mEq/L (ref 135–145)

## 2015-05-29 LAB — TSH: TSH: 2.1 u[IU]/mL (ref 0.35–4.50)

## 2015-05-29 NOTE — Progress Notes (Signed)
Patient ID: Peter [name], male   DOB: [birth year], 69 y.o.   MRN: [id number]        [name] came by the office today c/o palpit-  several day hx of skip beats, several per min but irreg spacing & intermittent, w/ some lightheadedness (no syncope or presyncope), no CP, no SOB;  He's been rather sedentary at work & not exercising as much as in the past due to his arthritis & pseudogout;  Drinks 2 cups of coffee daily, never smoked, no pseudophed etc;  Hx HBP on ASA81, Amlod10, Accupril10, HCT25, K20- labs were all wnl last spring for doctor's day lab check...      EXAM is unremarkable w/ BP 130/70, pulse 66 & reg;  Chest- clear w/o w/r/r;  Heart- RR w/o m/r/g;  Ext- neg w/o c/c/e...      EKG shows NSR, rate 66, no skips on the 1st tracing & it is wnl & similar to EKGs from 2009 & 2012 in [place]...       Rhythm strips show isolated PVCs which appear unifocal & fix couped to my review, intermittent w/ max freq of 1/5 beats, no pairs or runs...       I discussed this w/ DrAllred on the phone & he agreed w/ checking lab- BMet & TSH;  Sched for 24H holter monitor to check PVC burden;  Sched f/u 2DEcho to assess LVF & valves;  Pt will avoid caffeine etc and plan appt w/ DrAllred wk of 06/10/15 to review these results and discuss w/ Cards/EP to see if any change in meds is warranted (he will be leaving on a planned [place] vacation 06/15/15)...        ADDENDUM>>  See my prev EPIC EMR notes>> CARDIAC Hx follows>>   HYPERTENSION (ICD-401.9) - controlled on meds- ASA 81mg /d, NORVASC 10mg /d, ACCUPRIL 10mg /d, HCTZ 25mg /d, KCl 51mEq/d...  ~ 6/12: BP= 134/84 today, takes meds regularly & tol well w/o side effects... BP checks at home and work are similar... denies HA, fatigue, visual changes, CP, palipit, syncope, dyspnea, edema, etc...  ~ 6/13: BP= 138/76 & he remains asymptomatic w/o CP, palpit, SOB, edema, etc... ~ CXR 11/14 showed norm heart size, clear lungs, post op ortho surg changes...  ~ 6/15: BP=  132/70 & he denies any CP, palpit, SOB, edema, etc... ~ 6/16: BP= 134/74 & he remains asymptomatic on Amlod10, Accupril10, HCT25, K20...  Hx of RHEUMATIC FEVER (ICD-390) - age 49, hosp for 3 days- no known sequelae; eval by cardiology in the 80's & no valvular heart disease...  Hx of CHEST PAIN w/ neg cardiac eval in 2008... EKG is normal> Episode of DIZZINESS/ Presyncope 6/12 w/ eval by Benay Spice... ~ Cardiolite in 2005 was neg- no ischemia & EF=60%...  ~ he was hosp w/ CP episode 3/08 by DrBBrodie- felt to be prob pericarditis...  ~ Coronary CTA in 2008 showing sl plaque at an LAD branch vessel (D2), mixed plaque in CIRC, no plaque in prox-mid RCA; Calcium score 0.65 (<25th percentlie)...  ~ 2DEcho 3/08 showed no signif valvular lesions, LVwall thickness at upper limit, no regional wall motion abn, LVF= 50-55%... ~ CDopplers 6/12 showed min plaque, no signif ICA stenoses... ~ 6/13: No recurrence of the severe dizziness, near syncope, etc... ~ EKG 11/14 showed SBrady, rate58, wnl, NAD.

## 2015-05-31 ENCOUNTER — Ambulatory Visit (INDEPENDENT_AMBULATORY_CARE_PROVIDER_SITE_OTHER): Payer: PRIVATE HEALTH INSURANCE

## 2015-05-31 ENCOUNTER — Other Ambulatory Visit: Payer: Self-pay | Admitting: Pulmonary Disease

## 2015-05-31 DIAGNOSIS — I493 Ventricular premature depolarization: Secondary | ICD-10-CM

## 2015-05-31 DIAGNOSIS — I209 Angina pectoris, unspecified: Secondary | ICD-10-CM

## 2015-06-03 ENCOUNTER — Ambulatory Visit (HOSPITAL_COMMUNITY): Payer: PRIVATE HEALTH INSURANCE | Attending: Cardiology

## 2015-06-03 ENCOUNTER — Other Ambulatory Visit: Payer: Self-pay

## 2015-06-03 DIAGNOSIS — I209 Angina pectoris, unspecified: Secondary | ICD-10-CM | POA: Diagnosis not present

## 2015-06-03 DIAGNOSIS — I10 Essential (primary) hypertension: Secondary | ICD-10-CM | POA: Insufficient documentation

## 2015-06-03 DIAGNOSIS — I071 Rheumatic tricuspid insufficiency: Secondary | ICD-10-CM | POA: Diagnosis not present

## 2015-06-10 ENCOUNTER — Ambulatory Visit (INDEPENDENT_AMBULATORY_CARE_PROVIDER_SITE_OTHER): Payer: PRIVATE HEALTH INSURANCE | Admitting: Internal Medicine

## 2015-06-10 ENCOUNTER — Encounter: Payer: Self-pay | Admitting: Internal Medicine

## 2015-06-10 VITALS — BP 136/82 | HR 77 | Ht 70.0 in | Wt 173.0 lb

## 2015-06-10 DIAGNOSIS — R002 Palpitations: Secondary | ICD-10-CM

## 2015-06-10 MED ORDER — DILTIAZEM HCL 30 MG PO TABS: 30.0000 mg | ORAL_TABLET | Freq: Every day | ORAL | Status: DC | PRN

## 2015-06-10 NOTE — Progress Notes (Signed)
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  He has a history of pseudogout (CPDD) for which he started taking turmeric. In the wake of this he started noting palpitations. He saw Dr.SNl. It was thought that he had PVCs and a Holter monitor was obtained confirming this diagnosis. 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 He stopped taking the turmeric and the symptoms have largely abated. Echocardiogram demonstrated normal LV function and normal structure.  He denies exertional chest pain or shortness of breath. He's had no peripheral edema nocturnal dyspnea or orthopnea.  He has had no syncope.  He uses caffeine. He has not noted a strong correlation.  He has treated hypertension and is on a diuretic. Last potassium 8/16 was 4.8. He takes potassium supplementation but no magnesium supplementation.  Past Medical History  Diagnosis Date  . Allergic rhinitis   . Tinnitus     chronic  . Hypertension   . History of rheumatic fever   . Hypercholesteremia     borderline  . GERD (gastroesophageal reflux disease)   . History of colonic polyps   . DJD (degenerative joint disease)   . History of pseudogout   . Spondylosis   . Family history of Alzheimer's disease   . History of melanoma   . Chest pain     Nuclear 2005, normal / coronary CTA 2008, slight mixed plaque, coronary calcium score 0.65, ejection fraction 55%, echo, March, 2008  . Ejection fraction     EF 55%, echo, March, 2008.  . Dizziness     Dizziness with question of presyncope April 08, 2011  . Cancer     Melanoma   . Lumbar spondylosis with myelopathy 06/09/2013  . Complication  of anesthesia     itching-not sure if oxycodone or anesthesia,and shakes  . Anxiety   . H/O hiatal hernia   . Heart murmur     69 yrs old rheumatic fever   . Heart murmur 2008    trivial tricusp regurg      Surgical History:  Past Surgical History  Procedure Laterality Date  . Melanoma excision with sentinel lymph node biopsy  8/02    bilat ing node bx  . Anterior cervical decomp/discectomy fusion  2003  . Rotator cuff repair Left     08/13/11  08/27/2011  . Lumbar fusion  12/14  . Cervical discectomy      anterior with patellar allograft and plating  . Carpal tunnel release Right 10/31/2013    Procedure: RIGHT CARPAL TUNNEL RELEASE;  Surgeon: Cammie Sickle., MD;  Location: [facility];  Service: Orthopedics;  Laterality: Right;  . Carpal tunnel release Left 11/28/2013    Procedure: LEFT CARPAL TUNNEL RELEASE;  Surgeon: Cammie Sickle., MD;  Location: [facility];  Service: Orthopedics;  Laterality: Left;     Home Meds: Prior to Admission medications   Medication Sig Start Date End Date Taking? Authorizing Provider  ALPRAZolam Duanne Moron) 0.5 MG tablet Take 1/2 to 1 tablet by mouth three times daily as needed for anxiety 04/20/14  Yes Noralee Space, MD  amLODipine (NORVASC) 10 MG tablet Take 1 tablet (  10 mg total) by mouth daily. 05/27/15  Yes Noralee Space, MD  aspirin 81 MG tablet Take 81 mg by mouth daily.   Yes Historical Provider, MD  buPROPion (WELLBUTRIN) 75 MG tablet Take 75 mg by mouth 2 (two) times daily.   Yes Historical Provider, MD  celecoxib (CELEBREX) 200 MG capsule TAKE 1 CAPSULE TWO TIMES A DAY 03/28/15  Yes Noralee Space, MD  colchicine 0.6 MG tablet Take 1 tablet (0.6 mg total) by mouth 2 (two) times daily. 05/27/15  Yes Noralee Space, MD  cycloSPORINE (RESTASIS) 0.05 % ophthalmic emulsion Place 1 drop into both eyes 2 (two) times daily.   Yes Historical Provider, MD  hydrochlorothiazide (HYDRODIURIL) 25 MG tablet Take 1 tablet (25 mg  total) by mouth daily. 03/11/15  Yes Noralee Space, MD  mometasone (NASONEX) 50 MCG/ACT nasal spray Place 2 sprays into the nose daily. 12/19/14  Yes Noralee Space, MD  montelukast (SINGULAIR) 10 MG tablet Take 1 tablet (10 mg total) by mouth at bedtime. 12/19/14  Yes Noralee Space, MD  potassium chloride SA (K-DUR,KLOR-CON) 20 MEQ tablet Take 1 tablet (20 mEq total) by mouth daily. 11/23/14  Yes Noralee Space, MD  quinapril (ACCUPRIL) 10 MG tablet Take 1 tablet (10 mg total) by mouth daily. 03/26/15  Yes Noralee Space, MD  ranitidine (ZANTAC) 150 MG capsule Take 150 mg by mouth 2 (two) times daily.   Yes Historical Provider, MD  rosuvastatin (CRESTOR) 10 MG tablet TAKE 1 TABLET AT BEDTIME 09/06/14  Yes Noralee Space, MD  traMADol (ULTRAM) 50 MG tablet Take 1-2 mg by mouth daily.   Yes Historical Provider, MD      Allergies:  Allergies  Allergen Reactions  . Gentamycin [Gentamicin]   . Neosporin [Neomycin-Bacitracin Zn-Polymyx]   . Bacitracin   . Moxifloxacin Other (See Comments)    REACTION: pt had severe red rash- he thought from Avelox Rx....  . Triamcinolone Acetonide Other (See Comments)    Unknown    . Other Other (See Comments)    Redness  bandaide    Social History   Social History  . Marital Status: Married    Spouse Name: Jackelyn Poling  . Number of Children: 2  . Years of Education: MD   Occupational History  . RADIOLOGIST    Social History Main Topics  . Smoking status: Never Smoker   . Smokeless tobacco: Never Used  . Alcohol Use: 0.0 oz/week    2-4 Glasses of wine per week     Comment: 3 x a week  . Drug Use: No  . Sexual Activity: Not on file   Other Topics Concern  . Not on file   Social History Narrative   Married - wife = [name]   2 children- Daughter=lawyer, Son=physician   Non smoker   Exercises 3 times per week   Caffeine use: 2 cups/d   etoh use: 1-2 glasses of wine nightly     Family History  Problem Relation Age of Onset  . Alzheimer's disease  Father   . Alzheimer's disease Mother      ROS:  Please see the history of present illness.     All other systems reviewed and negative.    Physical Exam: Blood pressure 136/82, pulse 77, height 5\' 10"  (1.778 m), weight 173 lb (78.472 kg). General: Well developed, well nourished male in no acute distress. Head: Normocephalic, atraumatic, sclera non-icteric, no xanthomas, nares are without discharge. EENT: normal Lymph  Nodes:  none Back: without scoliosis/kyphosis, no CVA tendersness Neck: Negative for carotid bruits. JVD not elevated. Lungs: Clear bilaterally to auscultation without wheezes, rales, or rhonchi. Breathing is unlabored. Heart: RRR with S1 S2. No murmur , rubs, or gallops appreciated. Abdomen: Soft, non-tender, non-distended with normoactive bowel sounds. No hepatomegaly. No rebound/guarding. No obvious abdominal masses. Msk:  Strength and tone appear normal for age. Extremities: No clubbing or cyanosis. No edema.  Distal pedal pulses are 2+ and equal bilaterally. Skin: Warm and Dry Neuro: Alert and oriented X 3. CN III-XII intact Grossly normal sensory and motor function . Psych:  Responds to questions appropriately with a normal affect.      Labs: Cardiac Enzymes No results for input(s): CKTOTAL, CKMB, TROPONINI in the last 72 hours. CBC Lab Results  Component Value Date   WBC 7.7 09/22/2013   HGB 16.0 11/28/2013   HCT 47.0 11/28/2013   MCV 92.2 09/22/2013   PLT 256 09/22/2013   PROTIME: No results for input(s): LABPROT, INR in the last 72 hours. Chemistry No results for input(s): NA, K, CL, CO2, BUN, CREATININE, CALCIUM, PROT, BILITOT, ALKPHOS, ALT, AST, GLUCOSE in the last 168 hours.  Invalid input(s): LABALBU Lipids Lab Results  Component Value Date   CHOL 228* 04/10/2011   HDL 57.70 04/10/2011   LDLCALC 104* 07/16/2008   TRIG 53.0 04/10/2011   BNP No results found for: PROBNP Thyroid Function Tests: No results for input(s): TSH, T4TOTAL,  T3FREE, THYROIDAB in the last 72 hours.  Invalid input(s): FREET3    Miscellaneous No results found for: DDIMER  Radiology/Studies:  No results found.  EKG: Sinus rhythm at 77 Intervals 15/09/38 Axis XLII Nonspecific T-wave changes  Holter monitor as recorded above mean heart rate 71 range 41-129. 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 Overdrive suppression noted.  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 In the context of normal LV function and a normal echocardiogram these are likely benign. I have explained this to the family.  The temporal relationship to the exposure to turmeric raises the possibility that they were causally related. He has stopped the supplementation and his PVCs have abated.  I've given him a prescription for diltiazem 30 mg to take as needed the event that thy become problematic again. We have also discussed  Virl Axe

## 2015-06-10 NOTE — Patient Instructions (Signed)
Medication Instructions:  Your physician has recommended you make the following change in your medication:  1) START Diltiazem 30 mg -- take one daily, as needed, for palptiations  Labwork: None ordered  Testing/Procedures: None ordered  Follow-Up: No follow up is needed at this time with Dr. [name].  He will see you on an as needed basis.  Any Other Special Instructions Will Be Listed Below (If Applicable). Thank you for choosing [facility]!!

## 2015-06-11 ENCOUNTER — Encounter: Payer: PRIVATE HEALTH INSURANCE | Admitting: Internal Medicine

## 2015-07-10 ENCOUNTER — Ambulatory Visit
Admission: RE | Admit: 2015-07-10 | Discharge: 2015-07-10 | Disposition: A | Discharge status: Home / Self Care | Payer: PRIVATE HEALTH INSURANCE | Source: Ambulatory Visit | Attending: Orthopedic Surgery | Admitting: Orthopedic Surgery

## 2015-07-10 ENCOUNTER — Other Ambulatory Visit: Payer: Self-pay | Admitting: Orthopedic Surgery

## 2015-07-10 DIAGNOSIS — M25561 Pain in right knee: Principal | ICD-10-CM

## 2015-07-10 DIAGNOSIS — G8929 Other chronic pain: Secondary | ICD-10-CM

## 2015-07-10 DIAGNOSIS — M25562 Pain in left knee: Principal | ICD-10-CM

## 2015-07-20 ENCOUNTER — Other Ambulatory Visit: Payer: Self-pay | Admitting: Pulmonary Disease

## 2015-08-04 ENCOUNTER — Other Ambulatory Visit: Payer: Self-pay | Admitting: Pulmonary Disease

## 2015-08-05 ENCOUNTER — Ambulatory Visit: Payer: PRIVATE HEALTH INSURANCE | Admitting: Physical Therapy

## 2015-08-09 ENCOUNTER — Encounter: Payer: PRIVATE HEALTH INSURANCE | Admitting: Physical Therapy

## 2015-08-12 ENCOUNTER — Ambulatory Visit: Payer: PRIVATE HEALTH INSURANCE | Attending: Neurosurgery

## 2015-08-12 DIAGNOSIS — M545 Low back pain, unspecified: Secondary | ICD-10-CM

## 2015-08-12 DIAGNOSIS — M25659 Stiffness of unspecified hip, not elsewhere classified: Secondary | ICD-10-CM | POA: Diagnosis present

## 2015-08-12 NOTE — Patient Instructions (Signed)
HIP: Hamstrings - Short Sitting    Rest leg on raised surface. Keep knee straight. Lift chest. Hold ___ seconds. ___ reps per set, ___ sets per day, ___ days per week  Copyright  VHI. All rights reserved.  Knee to Chest (Flexion)    Pull knee toward chest. Feel stretch in lower back or buttock area. Breathing deeply, Hold ____ seconds. Repeat with other knee. Repeat ____ times. Do ____ sessions per day.  http://gt2.exer.us/225   Copyright  VHI. All rights reserved.  Lower abdominal/core stability exercises  1. Practice your breathing technique: Inhale through your nose expanding your belly and rib cage. Try not to breathe into your chest. Exhale slowly and gradually out your mouth feeling a sense of softness to your body. Practice multiple times. This can be performed unlimited.  2. Finding the lower abdominals. Laying on your back with the knees bent, place your fingers just below your belly button. Using your breathing technique from above, on your exhale gently pull the belly button away from your fingertips without tensing any other muscles. Practice this 5x. Next, as you exhale, draw belly button inwards and hold onto it...then feel as if you are pulling that muscle across your pelvis like you are tightening a belt. This can be hard to do at first so be patient and practice. Do 5-10 reps 1-3 x day. Always recognize quality over quantity; if your abdominal muscles become tired you will notice you may tighten/contract other muscles. This is the time to take a break.   Practice this first laying on your back, then in sitting, progressing to standing and finally adding it to all your daily movements.   3. Finding your pelvic floor. Using the breathing technique above, when your exhale, this time draw your pelvic floor muscles up as if you were attempting to stop the flow of urination. Be careful NOT to tense any other muscles. This can be hard, BE PATIENT. Try to hold up to 10 seconds  repeating 10x. Try 2x a day. Once you feel you are doing this well, add this contraction to exercise #2. First contracting your pelvic floor followed by lower abdominals.   4. Adding leg movements. Add the following leg movements to challenge your ability to keep your core stable:  1. Single leg drop outs: Laying on your back with knees bent feet flat. Inhale,  dropping one knee outward KEEPING YOUR PELVIS STILL. Exhale as you bring the leg back, simultaneously performing your lower abdominal contraction. Do 5-10 on each leg.  [facility] [address], [place], [place] [address] Phone # [phone] Fax [phone]

## 2015-08-12 NOTE — Therapy (Signed)
[facility] [address] [place], [place], [address] Phone: [phone]   Fax:  [phone]  Physical Therapy Evaluation  Patient Details  Name: Peter AVINO, MD MRN: [id number] Date of Birth: 1946/01/07 Referring Provider: Dr Jovita Gamma  Encounter Date: 08/12/2015      PT End of Session - 08/12/15 1013    Visit Number 1   Date for PT Re-Evaluation 09/16/15   PT Start Time 0933   PT Stop Time 1013   PT Time Calculation (min) 40 min   Activity Tolerance Patient tolerated treatment well   Behavior During Therapy [facility] for tasks assessed/performed      Past Medical History  Diagnosis Date  . Allergic rhinitis   . Tinnitus     chronic  . Hypertension   . History of rheumatic fever   . Hypercholesteremia     borderline  . GERD (gastroesophageal reflux disease)   . History of colonic polyps   . DJD (degenerative joint disease)   . History of pseudogout   . Spondylosis   . Family history of Alzheimer's disease   . History of melanoma   . Chest pain     Nuclear 2005, normal / coronary CTA 2008, slight mixed plaque, coronary calcium score 0.65, ejection fraction 55%, echo, March, 2008  . Ejection fraction     EF 55%, echo, March, 2008.  . Dizziness     Dizziness with question of presyncope April 08, 2011  . Cancer ([place])     Melanoma   . Lumbar spondylosis with myelopathy 06/09/2013  . Complication of anesthesia     itching-not sure if oxycodone or anesthesia,and shakes  . Anxiety   . H/O hiatal hernia   . Heart murmur     69 yrs old rheumatic fever   . Heart murmur 2008    trivial tricusp regurg    Past Surgical History  Procedure Laterality Date  . Melanoma excision with sentinel lymph node biopsy  8/02    bilat ing node bx  . Anterior cervical decomp/discectomy fusion  2003  . Rotator cuff repair Left     08/13/11  08/27/2011  . Lumbar fusion  12/14  . Cervical discectomy      anterior with  patellar allograft and plating  . Carpal tunnel release Right 10/31/2013    Procedure: RIGHT CARPAL TUNNEL RELEASE;  Surgeon: Cammie Sickle., MD;  Location: [facility];  Service: Orthopedics;  Laterality: Right;  . Carpal tunnel release Left 11/28/2013    Procedure: LEFT CARPAL TUNNEL RELEASE;  Surgeon: Cammie Sickle., MD;  Location: [facility];  Service: Orthopedics;  Laterality: Left;    There were no vitals filed for this visit.  Visit Diagnosis:  Bilateral low back pain without sciatica - Plan: PT plan of care cert/re-cert  Hip stiffness, unspecified laterality - Plan: PT plan of care cert/re-cert      Subjective Assessment - 08/12/15 0934    Subjective Pt presents to PT with complaints of LBP that is intermittent.  Pt is scheduled to have Rt TKA 09/2015 and MD would like him to learn exercises to strengthen his core.   Pt had lumbar fusion L3-5 and reports that L5/S1 is degenerated.  Pt also has psuedo gout which has caused rapid degeneration in his joints.   Pertinent History Rt TKA scheduled 09/2015, L3-5 fusion, pseudo gout   Limitations Standing   How long can you stand comfortably?  limited by Rt knee and bilateral peripheral neuropathy   Diagnostic tests MRI 3 months ago   Patient Stated Goals learn core stabilization exercises, reduce LBP   Currently in Pain? 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  No   Is the patient reluctant to leave their home because of a fear of falling?  No   Home Environment   Living Environment Private residence   Type of [place] Access Stairs to enter   Home Layout Two level   [place] None   Prior Function   Level of Independence Independent   Vocation Full time employment   Education officer, museum   Leisure Pt has not been able to exercise for > 1 year due to Rt knee   Cognition   Overall Cognitive Status Within Functional Limits for tasks assessed   Observation/Other Assessments   Focus on Therapeutic Outcomes (FOTO)  45% limitation   Posture/Postural Control   Posture/Postural Control Postural limitations   Postural Limitations Decreased lumbar lordosis   ROM / Strength   AROM / PROM / Strength AROM;PROM;Strength   AROM   Overall AROM  Within functional limits for tasks performed   Overall AROM Comments WFLs without pain today in the lumbar spine   PROM   Overall PROM  Deficits   Overall PROM Comments hip flexibility limited by 25% bilaterally into flexion and IR   Strength   Overall Strength Within functional limits for tasks performed   Overall Strength Comments 4+/5 to 5/5 bilateral LE strength   Palpation   Spinal mobility reduced mobility L3-5 secondary to fusion.  No pain.   Palpation comment Tension noted in deep gluteals bilaterally and bilateral lumbar paraspinals.     Ambulation/Gait   Ambulation/Gait Yes   Ambulation/Gait Assistance 7: Independent                           PT Education - 08/12/15 1006    Education provided Yes   Education Details HEP: TA activiation, single knee to chest, seated hamstring stretch   Person(s) Educated Patient   Methods Explanation;Demonstration;Handout   Comprehension  Verbalized understanding;Returned demonstration             PT Long Term Goals - 08/12/15 0932    PT LONG TERM GOAL #1   Title be independent in advanced HEP   Time 5   Period Weeks   Status New   PT LONG TERM GOAL #2   Title reduce FOTO to < or = to 37% limitation   Time 5   Period Weeks   Status  New   PT LONG TERM GOAL #3   Title report a 25% reduction in LBP with movement with ADLs   Time 5   Period Weeks   Status New   PT LONG TERM GOAL #4   Title verbalize understanding of how to progress core strength exercises    Time 5   Period Weeks   Status New   PT LONG TERM GOAL #5   Title verbalize and demostrate correct body mehcanics for safety with daily activities   Time 5   Period Weeks   Status New               Plan - 08/12/15 1013    Clinical Impression Statement Pt presents to PT with complatins of chronic and intermittent LBP.  Pt has had lumbar fusion L3-5 and has degeneration at the levels above and below.  Pt will have knee replacement on the Rt in December 2016 and would like to learn core exercises to stabilize spine and reduce pain.  Pt demonstrates hip stiffness, core weakness and FOTO score of 37% limitaiton.  Pt will benefit from skilled PT for education in core strength exercises and hip flexibility to reduce pain.     Pt will benefit from skilled therapeutic intervention in order to improve on the following deficits Decreased range of motion;Pain;Decreased strength;Decreased activity tolerance   Rehab Potential Good   PT Frequency 2x / week   PT Duration Other (comment)  5 weeks   PT Treatment/Interventions ADLs/Self Care Home Management;Cryotherapy;Electrical Stimulation;Moist Heat;Therapeutic exercise;Therapeutic activities;Neuromuscular re-education;Patient/family education;Manual techniques;Passive range of motion   PT Next Visit Plan advancement of TA exercises, review iniital HEP   Consulted and Agree with Plan of Care Patient          Problem List Patient Active Problem List   Diagnosis Date Noted  . Lesion of skeletal muscle 04/22/2015  . Lumbar stenosis with neurogenic claudication 10/02/2013  . Lumbosacral spinal stenosis 06/09/2013  . Lumbar spondylosis with myelopathy 06/09/2013  . Tear film insufficiency, unspecified 06/05/2013  . Thoracic or lumbosacral neuritis or radiculitis, unspecified 06/05/2013  . Inflammation of shoulder joint 10/09/2011  . History of repair of left rotator cuff 10/09/2011  . Need for diphtheria-tetanus-pertussis (Tdap) vaccine, adult/adolescent 04/13/2011  . Chest pain   . Dizziness   . Physical exam, annual 03/26/2011  . DEGENERATIVE JOINT DISEASE 06/21/2010  . PSEUDOGOUT 06/20/2010  . HIP PAIN 06/20/2010  . HYPERCHOLESTEROLEMIA, BORDERLINE 07/19/2008  . TINNITUS, CHRONIC 07/19/2008  . RHEUMATIC FEVER 07/19/2008  . ALLERGIC RHINITIS 07/19/2008  . GERD 07/19/2008  . SPONDYLOSIS 07/19/2008  . COLONIC POLYPS, HX OF 07/19/2008  . MELANOMA 01/11/2008  . HYPERTENSION 01/11/2008    Leland Staszewski, PT 08/12/2015, 10:27 AM  [facility] [address] [place], [place], [address] Phone: [phone]   Fax:  [phone]  Name: ARMAN LOY, MD MRN: [id number] Date of Birth: [birth year]

## 2015-08-23 ENCOUNTER — Encounter: Payer: Self-pay | Admitting: Physical Therapy

## 2015-08-23 ENCOUNTER — Ambulatory Visit: Payer: PRIVATE HEALTH INSURANCE | Admitting: Physical Therapy

## 2015-08-23 DIAGNOSIS — M25659 Stiffness of unspecified hip, not elsewhere classified: Secondary | ICD-10-CM

## 2015-08-23 DIAGNOSIS — M545 Low back pain, unspecified: Secondary | ICD-10-CM

## 2015-08-23 NOTE — Patient Instructions (Signed)
Lower abdominal/core stability exercises  1. Practice your breathing technique: Inhale through your nose expanding your belly and rib cage. Try not to breathe into your chest. Exhale slowly and gradually out your mouth feeling a sense of softness to your body. Practice multiple times. This can be performed unlimited.  2. Finding the lower abdominals. Laying on your back with the knees bent, place your fingers just below your belly button. Using your breathing technique from above, on your exhale gently pull the belly button away from your fingertips without tensing any other muscles. Practice this 5x. Next, as you exhale, draw belly button inwards and hold onto it...then feel as if you are pulling that muscle across your pelvis like you are tightening a belt. This can be hard to do at first so be patient and practice. Do 5-10 reps 1-3 x day. Always recognize quality over quantity; if your abdominal muscles become tired you will notice you may tighten/contract other muscles. This is the time to take a break.   Practice this first laying on your back, then in sitting, progressing to standing and finally adding it to all your daily movements.   3. Finding your pelvic floor. Using the breathing technique above, when your exhale, this time draw your pelvic floor muscles up as if you were attempting to stop the flow of urination. Be careful NOT to tense any other muscles. This can be hard, BE PATIENT. Try to hold up to 10 seconds repeating 10x. Try 2x a day. Once you feel you are doing this well, add this contraction to exercise #2. First contracting your pelvic floor followed by lower abdominals.  4. Adding leg movements. Add the following leg movements to challenge your ability to keep your core stable:  1. Single leg drop outs: Laying on your back with knees bent feet flat. Inhale,  dropping one knee outward KEEPING YOUR PELVIS STILL. Exhale as you bring the leg back, simultaneously performing your lower  abdominal contraction. Do 5-10 on each leg.  2. Marching: While keeping your pelvis still, lift the right foot a few inches, put it down then lift left foot. This will mimic a march. Start slow to establish control. Once you have control you may speed it up. Do 10-20x. You MUST keep your lower abdominlas contracted while you march. Breathe naturally   3. Single leg slides: Inhale while you slowly slide one leg out keeping your pelvis still. Only slide your leg as far as you can keep your pelvis still. Exhale as you bring the leg back to the start, contracting the lower abdominals as you do that. Keep your upper body relaxed. Do 5-10 on each side. Lower abdominal/core stability exercises  1. Practice your breathing technique: Inhale through your nose expanding your belly and rib cage. Try not to breathe into your chest. Exhale slowly and gradually out your mouth feeling a sense of softness to your body. Practice multiple times. This can be performed unlimited.  2. Finding the lower abdominals. Laying on your back with the knees bent, place your fingers just below your belly button. Using your breathing technique from above, on your exhale gently pull the belly button away from your fingertips without tensing any other muscles. Practice this 5x. Next, as you exhale, draw belly button inwards and hold onto it...then feel as if you are pulling that muscle across your pelvis like you are tightening a belt. This can be hard to do at first so be patient and practice. Do 5-10   reps 1-3 x day. Always recognize quality over quantity; if your abdominal muscles become tired you will notice you may tighten/contract other muscles. This is the time to take a break.   Practice this first laying on your back, then in sitting, progressing to standing and finally adding it to all your daily movements.   3. Finding your pelvic floor. Using the breathing technique above, when your exhale, this time draw your pelvic floor  muscles up as if you were attempting to stop the flow of urination. Be careful NOT to tense any other muscles. This can be hard, BE PATIENT. Try to hold up to 10 seconds repeating 10x. Try 2x a day. Once you feel you are doing this well, add this contraction to exercise #2. First contracting your pelvic floor followed by lower abdominals.   4. Adding leg movements. Add the following leg movements to challenge your ability to keep your core stable:  1. Single leg drop outs: Laying on your back with knees bent feet flat. Inhale,  dropping one knee outward KEEPING YOUR PELVIS STILL. Exhale as you bring the leg back, simultaneously performing your lower abdominal contraction. Do 5-10 on each leg.   2. Marching: While keeping your pelvis still, lift the right foot a few inches, put it down then lift left foot. This will mimic a march. Start slow to establish control. Once you have control you may speed it up. Do 10-20x. You MUST keep your lower abdominlas contracted while you march. Breathe naturally    3. Single leg slides: Inhale while you slowly slide one leg out keeping your pelvis still. Only slide your leg as far as you can keep your pelvis still. Exhale as you bring the leg back to the start, contracting the lower abdominals as you do that. Keep your upper body relaxed. Do 5-10 on each side.        Supine Level 2:   1. Knee fold: In neutral spine ( be aware of NOT tilting pelvis posteriorly) Engage core, bring one knee to 90 degrees, then other knee, hold 2 sec, let feet down one at a time. Do 5x each side.  2. On the 5th rep of the above exercise hold legs up, then tap heel alternating 10 each. Do not change the shape of your back. Breathe!! Tapping farther away increases difficulty.   3. Transition to bicycle if you can 10-20 sec, breathing and deepening through the core.   Sidelying:  Head relaxed, lay on true side, bottom knee bent.   1. Find the core 5x to increase awareness, then  lift top leg and straighten it out.              - Straight leg forward & back 10x  - Bicycle 6x       - Leg lifts 10x

## 2015-08-23 NOTE — Therapy (Signed)
[facility] [address] [place], [place], [address] Phone: [phone]   Fax:  [phone]  Physical Therapy Treatment  Patient Details  Name: Peter KRAMP, Peter [name] MRN: [id number] Date of Birth: Jun 30, 1946 Referring Provider: Dr Jovita Gamma  Encounter Date: 08/23/2015      PT End of Session - 08/23/15 1018    Visit Number 2   Date for PT Re-Evaluation 09/16/15   PT Start Time 0928   PT Stop Time 1017   PT Time Calculation (min) 49 min   Activity Tolerance Patient tolerated treatment well   Behavior During Therapy [facility] for tasks assessed/performed      Past Medical History  Diagnosis Date  . Allergic rhinitis   . Tinnitus     chronic  . Hypertension   . History of rheumatic fever   . Hypercholesteremia     borderline  . GERD (gastroesophageal reflux disease)   . History of colonic polyps   . DJD (degenerative joint disease)   . History of pseudogout   . Spondylosis   . Family history of Alzheimer's disease   . History of melanoma   . Chest pain     Nuclear 2005, normal / coronary CTA 2008, slight mixed plaque, coronary calcium score 0.65, ejection fraction 55%, echo, March, 2008  . Ejection fraction     EF 55%, echo, March, 2008.  . Dizziness     Dizziness with question of presyncope April 08, 2011  . Cancer ([place])     Melanoma   . Lumbar spondylosis with myelopathy 06/09/2013  . Complication of anesthesia     itching-not sure if oxycodone or anesthesia,and shakes  . Anxiety   . H/O hiatal hernia   . Heart murmur     69 yrs old rheumatic fever   . Heart murmur 2008    trivial tricusp regurg    Past Surgical History  Procedure Laterality Date  . Melanoma excision with sentinel lymph node biopsy  8/02    bilat ing node bx  . Anterior cervical decomp/discectomy fusion  2003  . Rotator cuff repair Left     08/13/11  08/27/2011  . Lumbar fusion  12/14  . Cervical discectomy      anterior with  patellar allograft and plating  . Carpal tunnel release Right 10/31/2013    Procedure: RIGHT CARPAL TUNNEL RELEASE;  Surgeon: Cammie Sickle., Peter [name];  Location: [place];  Service: Orthopedics;  Laterality: Right;  . Carpal tunnel release Left 11/28/2013    Procedure: LEFT CARPAL TUNNEL RELEASE;  Surgeon: Cammie Sickle., Peter [name];  Location: [place];  Service: Orthopedics;  Laterality: Left;    There were no vitals filed for this visit.  Visit Diagnosis:  Bilateral low back pain without sciatica  Hip stiffness, unspecified laterality      Subjective Assessment - 08/23/15 0930    Subjective I'm ready for more HEP.   Currently in Pain? No/denies   Multiple Pain Sites No                                 PT Education - 08/23/15 0941    Education provided Yes   Education Details HEP  more advanced core in supine and sidelying positions    Person(s) Educated --   Health and safety inspector;Tactile cues;Verbal cues;Handout;Explanation   Comprehension Verbalized understanding;Returned demonstration  PT Long Term Goals - 08/23/15 1017    PT LONG TERM GOAL #1   Title be independent in advanced HEP   Time 5   Period Weeks   Status On-going  Began today   PT LONG TERM GOAL #2   Title reduce FOTO to < or = to 37% limitation   Time 5   Period Weeks   Status On-going   PT LONG TERM GOAL #3   Title report a 25% reduction in LBP with movement with ADLs   Time 5   Period Weeks   Status On-going  Just started treatment today   PT LONG TERM GOAL #4   Title verbalize understanding of how to progress core strength exercises    Time 5   Period Weeks   Status On-going               Plan - 08/23/15 1112    Clinical Impression Statement Adavnced HEP to include Level 2 core exercises in supine and sidelying core and hip strength. Pain was not limiting today.    Pt will benefit from skilled therapeutic  intervention in order to improve on the following deficits Decreased range of motion;Pain;Decreased strength;Decreased activity tolerance   Rehab Potential Good   PT Frequency 2x / week   PT Duration Other (comment)   PT Treatment/Interventions ADLs/Self Care Home Management;Cryotherapy;Electrical Stimulation;Moist Heat;Therapeutic exercise;Therapeutic activities;Neuromuscular re-education;Patient/family education;Manual techniques;Passive range of motion   PT Next Visit Plan Review HEP and progress   Consulted and Agree with Plan of Care Patient        Problem List Patient Active Problem List   Diagnosis Date Noted  . Lesion of skeletal muscle 04/22/2015  . Lumbar stenosis with neurogenic claudication 10/02/2013  . Lumbosacral spinal stenosis 06/09/2013  . Lumbar spondylosis with myelopathy 06/09/2013  . Tear film insufficiency, unspecified 06/05/2013  . Thoracic or lumbosacral neuritis or radiculitis, unspecified 06/05/2013  . Inflammation of shoulder joint 10/09/2011  . History of repair of left rotator cuff 10/09/2011  . Need for diphtheria-tetanus-pertussis (Tdap) vaccine, adult/adolescent 04/13/2011  . Chest pain   . Dizziness   . Physical exam, annual 03/26/2011  . DEGENERATIVE JOINT DISEASE 06/21/2010  . PSEUDOGOUT 06/20/2010  . HIP PAIN 06/20/2010  . HYPERCHOLESTEROLEMIA, BORDERLINE 07/19/2008  . TINNITUS, CHRONIC 07/19/2008  . RHEUMATIC FEVER 07/19/2008  . ALLERGIC RHINITIS 07/19/2008  . GERD 07/19/2008  . SPONDYLOSIS 07/19/2008  . COLONIC POLYPS, HX OF 07/19/2008  . MELANOMA 01/11/2008  . HYPERTENSION 01/11/2008    Jettie Lazare  ,PTA  08/23/2015, 11:21 AM  [facility] [address] [place], [place], [address] Phone: [phone]   Fax:  [phone]  Name: Peter SCHREIER, Peter [name] MRN: [id number] Date of Birth: [birth year]

## 2015-08-26 ENCOUNTER — Encounter: Payer: Self-pay | Admitting: Physical Therapy

## 2015-08-26 ENCOUNTER — Ambulatory Visit: Payer: PRIVATE HEALTH INSURANCE | Admitting: Physical Therapy

## 2015-08-26 DIAGNOSIS — M25659 Stiffness of unspecified hip, not elsewhere classified: Secondary | ICD-10-CM

## 2015-08-26 DIAGNOSIS — M545 Low back pain, unspecified: Secondary | ICD-10-CM

## 2015-08-26 NOTE — Therapy (Signed)
[facility] [address], [place], [place], [address] Phone: [phone]   Fax:  [phone]  Physical Therapy Treatment  Patient Details  Name: Peter CHESNUT, MD MRN: [id number] Date of Birth: [birth year] Referring Provider: Dr Jovita Gamma  Encounter Date: 08/26/2015      PT End of Session - 08/26/15 1006    Visit Number 3   Date for PT Re-Evaluation 09/16/15   PT Start Time 0928   PT Stop Time 1016   PT Time Calculation (min) 48 min   Activity Tolerance Patient tolerated treatment well   Behavior During Therapy [facility] for tasks assessed/performed      Past Medical History  Diagnosis Date  . Allergic rhinitis   . Tinnitus     chronic  . Hypertension   . History of rheumatic fever   . Hypercholesteremia     borderline  . GERD (gastroesophageal reflux disease)   . History of colonic polyps   . DJD (degenerative joint disease)   . History of pseudogout   . Spondylosis   . Family history of Alzheimer's disease   . History of melanoma   . Chest pain     Nuclear 2005, normal / coronary CTA 2008, slight mixed plaque, coronary calcium score 0.65, ejection fraction 55%, echo, March, 2008  . Ejection fraction     EF 55%, echo, March, 2008.  . Dizziness     Dizziness with question of presyncope April 08, 2011  . Cancer ([place])     Melanoma   . Lumbar spondylosis with myelopathy 06/09/2013  . Complication of anesthesia     itching-not sure if oxycodone or anesthesia,and shakes  . Anxiety   . H/O hiatal hernia   . Heart murmur     69 yrs old rheumatic fever   . Heart murmur 2008    trivial tricusp regurg    Past Surgical History  Procedure Laterality Date  . Melanoma excision with sentinel lymph node biopsy  8/02    bilat ing node bx  . Anterior cervical decomp/discectomy fusion  2003  . Rotator cuff repair Left     08/13/11  08/27/2011  . Lumbar fusion  12/14  . Cervical discectomy      anterior with  patellar allograft and plating  . Carpal tunnel release Right 10/31/2013    Procedure: RIGHT CARPAL TUNNEL RELEASE;  Surgeon: Cammie Sickle., MD;  Location: [place];  Service: Orthopedics;  Laterality: Right;  . Carpal tunnel release Left 11/28/2013    Procedure: LEFT CARPAL TUNNEL RELEASE;  Surgeon: Cammie Sickle., MD;  Location: [place];  Service: Orthopedics;  Laterality: Left;    There were no vitals filed for this visit.  Visit Diagnosis:  Bilateral low back pain without sciatica  Hip stiffness, unspecified laterality      Subjective Assessment - 08/26/15 0930    Subjective Walked some SAturday and this increased some pain in the back.    Currently in Pain? Yes   Pain Score 1    Pain Location Back   Pain Orientation Right   Pain Descriptors / Indicators --  Intermittent   Aggravating Factors  Being on feet too long.   Pain Relieving Factors Rest   Multiple Pain Sites No                         [facility] Adult PT Treatment/Exercise - 08/26/15 0001  Lumbar Exercises: Supine   Other Supine Lumbar Exercises Added scissor legs to supine routine for HEP.    Lumbar Exercises: Sidelying   Other Sidelying Lumbar Exercises PLank series for HEP, bil   Lumbar Exercises: Prone   Other Prone Lumbar Exercises Plank on forearms  Given for HEP                PT Education - 08/26/15 0952    Education provided Yes   Education Details HEP, plank series in mulltiple planes   Person(s) Educated Patient   Methods Explanation;Demonstration;Tactile cues;Verbal cues;Handout   Comprehension Verbalized understanding;Returned demonstration             PT Long Term Goals - 08/26/15 1009    PT LONG TERM GOAL #1   Title be independent in advanced HEP   Time 5   Period Weeks   Status Achieved   PT LONG TERM GOAL #3   Title report a 25% reduction in LBP with movement with ADLs   Time 5   Period Weeks   Status On-going   Not worse               Plan - 08/26/15 1006    Clinical Impression Statement Pt doing very well with his core exercises, no pain in either back or knee when performing. Added various planks and positions to further his difficulty.    Pt will benefit from skilled therapeutic intervention in order to improve on the following deficits Decreased range of motion;Pain;Decreased strength;Decreased activity tolerance   Rehab Potential Good   PT Frequency 2x / week   PT Duration Other (comment)   PT Treatment/Interventions ADLs/Self Care Home Management;Cryotherapy;Electrical Stimulation;Moist Heat;Therapeutic exercise;Therapeutic activities;Neuromuscular re-education;Patient/family education;Manual techniques;Passive range of motion   PT Next Visit Plan Review HEP and progress   Consulted and Agree with Plan of Care Patient        Problem List Patient Active Problem List   Diagnosis Date Noted  . Lesion of skeletal muscle 04/22/2015  . Lumbar stenosis with neurogenic claudication 10/02/2013  . Lumbosacral spinal stenosis 06/09/2013  . Lumbar spondylosis with myelopathy 06/09/2013  . Tear film insufficiency, unspecified 06/05/2013  . Thoracic or lumbosacral neuritis or radiculitis, unspecified 06/05/2013  . Inflammation of shoulder joint 10/09/2011  . History of repair of left rotator cuff 10/09/2011  . Need for diphtheria-tetanus-pertussis (Tdap) vaccine, adult/adolescent 04/13/2011  . Chest pain   . Dizziness   . Physical exam, annual 03/26/2011  . DEGENERATIVE JOINT DISEASE 06/21/2010  . PSEUDOGOUT 06/20/2010  . HIP PAIN 06/20/2010  . HYPERCHOLESTEROLEMIA, BORDERLINE 07/19/2008  . TINNITUS, CHRONIC 07/19/2008  . RHEUMATIC FEVER 07/19/2008  . ALLERGIC RHINITIS 07/19/2008  . GERD 07/19/2008  . SPONDYLOSIS 07/19/2008  . COLONIC POLYPS, HX OF 07/19/2008  . MELANOMA 01/11/2008  . HYPERTENSION 01/11/2008    Nilah Belcourt, PTA 08/26/2015, 10:13 AM  [facility] [address], [place], [place], [address] Phone: [phone]   Fax:  [phone]  Name: CHANSON TEEMS, MD MRN: [id number] Date of Birth: 1946-10-12

## 2015-08-26 NOTE — Patient Instructions (Signed)
Abductor Strength: Side Plank Pose, on Knees   Engage your core first , then  lift hips. Keep gaze forward.  Hold for ___4_ breaths. Repeat _2___ times each side.   More advanced position: straighten top leg and/or bottom leg.   Plank    Support body on hands and feet or go on forearms. Keep hips in line with torso and arms straight under chest. Avoid locking elbows. Hold for _4___ breaths. -Add alternating hip extension, small lift from hamstring, do 10x each side. KEEP CORE DRAWING UP & IN.  - Alternating knee taps; inhale tap, exhale straighten. 5-10 x each leg   - Add push up to plank. 5-10, inhale down, exhale push away.  Copyright  VHI. All rights reserved.    Copyright  VHI. All rights reserved.

## 2015-08-30 ENCOUNTER — Ambulatory Visit: Payer: PRIVATE HEALTH INSURANCE | Attending: Neurosurgery | Admitting: Physical Therapy

## 2015-08-30 ENCOUNTER — Encounter: Payer: Self-pay | Admitting: Physical Therapy

## 2015-08-30 DIAGNOSIS — M25659 Stiffness of unspecified hip, not elsewhere classified: Secondary | ICD-10-CM | POA: Diagnosis present

## 2015-08-30 DIAGNOSIS — M545 Low back pain, unspecified: Secondary | ICD-10-CM

## 2015-08-30 NOTE — Therapy (Signed)
[facility] [address] [place], [place], [address] Phone: [phone]   Fax:  [phone]  Physical Therapy Treatment  Patient Details  Name: Peter CROSSAN, MD MRN: [id number] Date of Birth: May 18, 1946 Referring Provider: Dr Jovita Gamma  Encounter Date: 08/30/2015      PT End of Session - 08/30/15 1000    Visit Number 4   Date for PT Re-Evaluation 09/16/15   PT Start Time 0928   PT Stop Time 1002   PT Time Calculation (min) 34 min   Activity Tolerance Patient tolerated treatment well   Behavior During Therapy [facility] for tasks assessed/performed      Past Medical History  Diagnosis Date  . Allergic rhinitis   . Tinnitus     chronic  . Hypertension   . History of rheumatic fever   . Hypercholesteremia     borderline  . GERD (gastroesophageal reflux disease)   . History of colonic polyps   . DJD (degenerative joint disease)   . History of pseudogout   . Spondylosis   . Family history of Alzheimer's disease   . History of melanoma   . Chest pain     Nuclear 2005, normal / coronary CTA 2008, slight mixed plaque, coronary calcium score 0.65, ejection fraction 55%, echo, March, 2008  . Ejection fraction     EF 55%, echo, March, 2008.  . Dizziness     Dizziness with question of presyncope April 08, 2011  . Cancer ([place])     Melanoma   . Lumbar spondylosis with myelopathy 06/09/2013  . Complication of anesthesia     itching-not sure if oxycodone or anesthesia,and shakes  . Anxiety   . H/O hiatal hernia   . Heart murmur     69 yrs old rheumatic fever   . Heart murmur 2008    trivial tricusp regurg    Past Surgical History  Procedure Laterality Date  . Melanoma excision with sentinel lymph node biopsy  8/02    bilat ing node bx  . Anterior cervical decomp/discectomy fusion  2003  . Rotator cuff repair Left     08/13/11  08/27/2011  . Lumbar fusion  12/14  . Cervical discectomy      anterior with  patellar allograft and plating  . Carpal tunnel release Right 10/31/2013    Procedure: RIGHT CARPAL TUNNEL RELEASE;  Surgeon: Cammie Sickle., MD;  Location: [place];  Service: Orthopedics;  Laterality: Right;  . Carpal tunnel release Left 11/28/2013    Procedure: LEFT CARPAL TUNNEL RELEASE;  Surgeon: Cammie Sickle., MD;  Location: [place];  Service: Orthopedics;  Laterality: Left;    There were no vitals filed for this visit.  Visit Diagnosis:  Bilateral low back pain without sciatica  Hip stiffness, unspecified laterality      Subjective Assessment - 08/30/15 0929    Subjective I feel I have more awareness of my core now. Has had grandchildren this week so has not practiced exercises at all.    Currently in Pain? No/denies   Multiple Pain Sites No                         [facility] Adult PT Treatment/Exercise - 08/30/15 0001    Lumbar Exercises: Supine   Other Supine Lumbar Exercises Supine core stablization program   Other Supine Lumbar Exercises Release work for hamstrings using soft foam roll.  Lumbar Exercises: Prone   Straight Leg Raise 10 reps   Other Prone Lumbar Exercises Plank on forearms  Given for HEP                     PT Long Term Goals - 08/26/15 1009    PT LONG TERM GOAL #1   Title be independent in advanced HEP   Time 5   Period Weeks   Status Achieved   PT LONG TERM GOAL #3   Title report a 25% reduction in LBP with movement with ADLs   Time 5   Period Weeks   Status On-going  Not worse               Plan - 08/30/15 0954    Clinical Impression Statement Pt has not been able to practice the new exercises this week due to family responsibilities. His back pain was aggrevated by his latest trip, but this has calmed down to his baseline. he does have intermittent discomfort.     Pt will benefit from skilled therapeutic intervention in order to improve on the following deficits  Decreased range of motion;Pain;Decreased strength;Decreased activity tolerance   Rehab Potential Good   PT Frequency 2x / week   PT Duration Other (comment)   PT Treatment/Interventions ADLs/Self Care Home Management;Cryotherapy;Electrical Stimulation;Moist Heat;Therapeutic exercise;Therapeutic activities;Neuromuscular re-education;Patient/family education;Manual techniques;Passive range of motion   PT Next Visit Plan Review HEP and progress   Consulted and Agree with Plan of Care Patient        Problem List Patient Active Problem List   Diagnosis Date Noted  . Lesion of skeletal muscle 04/22/2015  . Lumbar stenosis with neurogenic claudication 10/02/2013  . Lumbosacral spinal stenosis 06/09/2013  . Lumbar spondylosis with myelopathy 06/09/2013  . Tear film insufficiency, unspecified 06/05/2013  . Thoracic or lumbosacral neuritis or radiculitis, unspecified 06/05/2013  . Inflammation of shoulder joint 10/09/2011  . History of repair of left rotator cuff 10/09/2011  . Need for diphtheria-tetanus-pertussis (Tdap) vaccine, adult/adolescent 04/13/2011  . Chest pain   . Dizziness   . Physical exam, annual 03/26/2011  . DEGENERATIVE JOINT DISEASE 06/21/2010  . PSEUDOGOUT 06/20/2010  . HIP PAIN 06/20/2010  . HYPERCHOLESTEROLEMIA, BORDERLINE 07/19/2008  . TINNITUS, CHRONIC 07/19/2008  . RHEUMATIC FEVER 07/19/2008  . ALLERGIC RHINITIS 07/19/2008  . GERD 07/19/2008  . SPONDYLOSIS 07/19/2008  . COLONIC POLYPS, HX OF 07/19/2008  . MELANOMA 01/11/2008  . HYPERTENSION 01/11/2008    Allin Frix, PTA 08/30/2015, 10:02 AM  [facility] [address] [place], [place], [address] Phone: [phone]   Fax:  [phone]  Name: Peter MORIS, MD MRN: [id number] Date of Birth: 1946-06-22

## 2015-09-02 ENCOUNTER — Encounter: Payer: PRIVATE HEALTH INSURANCE | Admitting: Physical Therapy

## 2015-09-03 DIAGNOSIS — Z23 Encounter for immunization: Secondary | ICD-10-CM | POA: Insufficient documentation

## 2015-09-04 ENCOUNTER — Encounter: Payer: PRIVATE HEALTH INSURANCE | Admitting: Physical Therapy

## 2015-09-06 ENCOUNTER — Telehealth: Payer: Self-pay | Admitting: Pulmonary Disease

## 2015-09-06 ENCOUNTER — Encounter: Payer: Self-pay | Admitting: Physical Therapy

## 2015-09-06 ENCOUNTER — Ambulatory Visit: Payer: PRIVATE HEALTH INSURANCE | Admitting: Physical Therapy

## 2015-09-06 DIAGNOSIS — M545 Low back pain, unspecified: Secondary | ICD-10-CM

## 2015-09-06 DIAGNOSIS — M25659 Stiffness of unspecified hip, not elsewhere classified: Secondary | ICD-10-CM

## 2015-09-06 NOTE — Telephone Encounter (Signed)
Pt called w/ URI, dry cough, pharyngitis/ laryngitis after exposure to his infant grandkids this wk; c/o sl sore throat, sl hoarse, denies f/c/s; sl cough w/o sput, no dyspnea, no CP, etc; prev responded to ZPak & Tussionex for similar symptoms...  REC>  Rx for ZPak (as directed) & Tussionex 1 tsp Q12H prn... He will call me for appt if not resolved...  SMN

## 2015-09-06 NOTE — Patient Instructions (Signed)
Bridge    Lie back, legs bent. Inhale, pressing hips up. Keeping ribs in, lengthen lower back. Exhale, rolling down along spine from top. Repeat _2___ times. Do _1___ sessions per day.  http://pm.exer.us/54   Copyright  VHI. All rights reserved.  Side Leg Circle    Lie on side, back straight along edge of mat, legs 30 in front of torso. Lift top leg to hip height. Rotate in small circle, __10__ times in each direction. Repeat __1__ times. Repeat on other side. Do _1___ sessions per day.  http://pm.exer.us/60   Copyright  VHI. All rights reserved.  Side Kick    Lie on side, back straight along edge of mat, legs 30 in front of torso. Lift top leg to hip height, foot flexed. Exhale, kicking forward twice. Inhale, kicking once backward with pointed foot. Keep leg hip height, torso still. Repeat __10__ times. Repeat on other side. Do __1__ sessions per day.  http://pm.exer.us/56   Copyright  VHI. All rights reserved.  Side Leg Lift    Lie on side, back straight along edge of mat, legs 30 in front of torso. Flexing foot, lift top leg to 45 without hiking hip. Lower leg, foot pointed. Repeat __10__ times. Repeat on other side. Do _1___ sessions per day.  http://pm.exer.us/58   Copyright  VHI. All rights reserved.  [place] [address], [place], [place] [address] Phone # [phone] Fax [phone]

## 2015-09-06 NOTE — Therapy (Signed)
[facility] [address], [place], [place], [address] Phone: [phone]   Fax:  [phone]  Physical Therapy Treatment  Patient Details  Name: Peter MEISTER, Peter [name] MRN: [id number] Date of Birth: 1946/02/18 Referring Provider: Dr. Jovita Gamma  Encounter Date: 09/06/2015      PT End of Session - 09/06/15 1125    Visit Number 5   Date for PT Re-Evaluation 09/16/15   PT Start Time 0930   PT Stop Time 1015   PT Time Calculation (min) 45 min   Activity Tolerance Patient tolerated treatment well   Behavior During Therapy [facility] for tasks assessed/performed      Past Medical History  Diagnosis Date  . Allergic rhinitis   . Tinnitus     chronic  . Hypertension   . History of rheumatic fever   . Hypercholesteremia     borderline  . GERD (gastroesophageal reflux disease)   . History of colonic polyps   . DJD (degenerative joint disease)   . History of pseudogout   . Spondylosis   . Family history of Alzheimer's disease   . History of melanoma   . Chest pain     Nuclear 2005, normal / coronary CTA 2008, slight mixed plaque, coronary calcium score 0.65, ejection fraction 55%, echo, March, 2008  . Ejection fraction     EF 55%, echo, March, 2008.  . Dizziness     Dizziness with question of presyncope April 08, 2011  . Cancer ([place])     Melanoma   . Lumbar spondylosis with myelopathy 06/09/2013  . Complication of anesthesia     itching-not sure if oxycodone or anesthesia,and shakes  . Anxiety   . H/O hiatal hernia   . Heart murmur     69 yrs old rheumatic fever   . Heart murmur 2008    trivial tricusp regurg    Past Surgical History  Procedure Laterality Date  . Melanoma excision with sentinel lymph node biopsy  8/02    bilat ing node bx  . Anterior cervical decomp/discectomy fusion  2003  . Rotator cuff repair Left     08/13/11  08/27/2011  . Lumbar fusion  12/14  . Cervical discectomy      anterior with  patellar allograft and plating  . Carpal tunnel release Right 10/31/2013    Procedure: RIGHT CARPAL TUNNEL RELEASE;  Surgeon: Cammie Sickle., Peter [name];  Location: [facility];  Service: Orthopedics;  Laterality: Right;  . Carpal tunnel release Left 11/28/2013    Procedure: LEFT CARPAL TUNNEL RELEASE;  Surgeon: Cammie Sickle., Peter [name];  Location: [facility];  Service: Orthopedics;  Laterality: Left;    There were no vitals filed for this visit.  Visit Diagnosis:  Bilateral low back pain without sciatica  Hip stiffness, unspecified laterality      Subjective Assessment - 09/06/15 0937    Subjective Today is my last day. I am having right total knee replacement.    Pertinent History Rt TKA scheduled 09/2015, L3-5 fusion, pseudo gout   Limitations Standing   How long can you stand comfortably? limited by Rt knee and bilateral peripheral neuropathy   Diagnostic tests MRI 3 months ago   Patient Stated Goals learn core stabilization exercises, reduce LBP   Currently in Pain? No/denies            [facility] PT Assessment - 09/06/15 0001    Assessment   Medical Diagnosis intervertebral disc  degeneration, lumbar (M51.36)   Referring Provider Dr. Jovita Gamma   Onset Date/Surgical Date 06/12/15   Next Peter [name] Visit 09/27/15   Precautions   Precautions None   Restrictions   Weight Bearing Restrictions No   Balance Screen   Has the patient fallen in the past 6 months No   Has the patient had a decrease in activity level because of a fear of falling?  No   Is the patient reluctant to leave their home because of a fear of falling?  No   Home Environment   Living Environment Private residence   Type of [place] Access Stairs to enter   Home Layout Two level   [place] None   Prior Function   Level of Independence Independent   Vocation Full time employment   Education officer, museum   Leisure Pt has not been able to exercise for > 1 year  due to Rt knee   Cognition   Overall Cognitive Status Within Functional Limits for tasks assessed   Observation/Other Assessments   Focus on Therapeutic Outcomes (FOTO)  46% limitation   Posture/Postural Control   Posture/Postural Control Postural limitations   Postural Limitations Decreased lumbar lordosis   AROM   Overall AROM  Within functional limits for tasks performed   Overall AROM Comments WFLs without pain today in the lumbar spine   PROM   Overall PROM  Deficits   Overall PROM Comments hip flexibility limited by 25% bilaterally into flexion and IR; right knee flexion 130 degrees and extension -5 degrees   Strength   Overall Strength Within functional limits for tasks performed   Overall Strength Comments 4+/5 to 5/5 bilateral LE strength   Palpation   SI assessment  right ilium rotated anteriorly   Ambulation/Gait   Ambulation/Gait Yes   Ambulation/Gait Assistance 7: Independent                     [facility] Adult PT Treatment/Exercise - 09/06/15 0001    Lumbar Exercises: Supine   Other Supine Lumbar Exercises Supine core stablization program   Other Supine Lumbar Exercises Release work for hamstrings using soft foam roll.   Lumbar Exercises: Sidelying   Other Sidelying Lumbar Exercises PLank series for HEP, bil   Lumbar Exercises: Prone   Straight Leg Raise 10 reps   Other Prone Lumbar Exercises Plank on forearms  Given for HEP   Manual Therapy   Manual Therapy Muscle Energy Technique   Muscle Energy Technique correct right anteriorly rotated ilium                PT Education - 09/06/15 1121    Education provided Yes   Education Details Pilates exercise and places to get classes, reviewed past HEP, information on ways to recover from TKR to reduce strain on lumbar, muscle energy technique to correct right posteriorly rotated ilium   Person(s) Educated Patient   Methods Explanation;Demonstration;Verbal cues;Handout   Comprehension Returned  demonstration;Verbalized understanding             PT Long Term Goals - 09/06/15 1125    PT LONG TERM GOAL #1   Title be independent in advanced HEP   Time 5   Period Weeks   Status Achieved   PT LONG TERM GOAL #2   Title reduce FOTO to < or = to 37% limitation   Time 5   Period Weeks   Status Not Met  46% limitation  PT LONG TERM GOAL #3   Title report a 25% reduction in LBP with movement with ADLs   Time 5   Period Weeks   Status Not Met   PT LONG TERM GOAL #4   Title verbalize understanding of how to progress core strength exercises    Time 5   Period Weeks   Status Achieved   PT LONG TERM GOAL #5   Title verbalize and demostrate correct body mehcanics for safety with daily activities   Time 5   Period Weeks   Status Achieved               Plan - 09/06/15 1126    Clinical Impression Statement Patient continues to have difficulty with performing his HEP due to work and family schedule.  Patient was given ways to incorportate his exercises.  Patient is independent with HEP.  Patient right ilium was anteriorly rotated and after therapy was in correct alignment. Patient has not met LTG# 3 due to no change in pain yet and FOTO goal dueto it being 46% limitation.    Pt will benefit from skilled therapeutic intervention in order to improve on the following deficits Decreased range of motion;Pain;Decreased strength;Decreased activity tolerance   Rehab Potential Good   Clinical Impairments Affecting Rehab Potential None   PT Treatment/Interventions ADLs/Self Care Home Management;Cryotherapy;Electrical Stimulation;Moist Heat;Therapeutic exercise;Therapeutic activities;Neuromuscular re-education;Patient/family education;Manual techniques;Passive range of motion   PT Next Visit Plan Discharge to HEP   PT Home Exercise Plan Current HEP   Recommended Other Services None   Consulted and Agree with Plan of Care Patient        Problem List Patient Active Problem List    Diagnosis Date Noted  . Lesion of skeletal muscle 04/22/2015  . Lumbar stenosis with neurogenic claudication 10/02/2013  . Lumbosacral spinal stenosis 06/09/2013  . Lumbar spondylosis with myelopathy 06/09/2013  . Tear film insufficiency, unspecified 06/05/2013  . Thoracic or lumbosacral neuritis or radiculitis, unspecified 06/05/2013  . Inflammation of shoulder joint 10/09/2011  . History of repair of left rotator cuff 10/09/2011  . Need for diphtheria-tetanus-pertussis (Tdap) vaccine, adult/adolescent 04/13/2011  . Chest pain   . Dizziness   . Physical exam, annual 03/26/2011  . DEGENERATIVE JOINT DISEASE 06/21/2010  . PSEUDOGOUT 06/20/2010  . HIP PAIN 06/20/2010  . HYPERCHOLESTEROLEMIA, BORDERLINE 07/19/2008  . TINNITUS, CHRONIC 07/19/2008  . RHEUMATIC FEVER 07/19/2008  . ALLERGIC RHINITIS 07/19/2008  . GERD 07/19/2008  . SPONDYLOSIS 07/19/2008  . COLONIC POLYPS, HX OF 07/19/2008  . MELANOMA 01/11/2008  . HYPERTENSION 01/11/2008    Jailen Coward,PT 09/06/2015, 11:31 AM  [facility] [address] [place], [place], [address] Phone: [phone]   Fax:  [phone]  Name: Peter BRAMBLE, Peter [name] MRN: [id number] Date of Birth: [birth year]  PHYSICAL THERAPY DISCHARGE SUMMARY  Visits from Start of Care: 5 Current functional level related to goals / functional outcomes: See above.  Has not met FOTO goal due to score was 46% limitation.  Has not met his pain goal due to no reduction in lumbar pain yet.    Remaining deficits: See above   Education / Equipment: HEP Plan: Patient agrees to discharge.  Patient goals were partially met. Patient is being discharged due to the patient's request. Thank you for the referral. Earlie Counts, PT 09/06/2015 11:36 AM   ?????

## 2015-09-14 ENCOUNTER — Ambulatory Visit: Payer: Self-pay | Admitting: Orthopedic Surgery

## 2015-09-14 NOTE — Progress Notes (Signed)
Preoperative surgical orders have been place into the Epic hospital system for Peter Bachelor, MD on 09/14/2015, 4:53 PM  by Mickel Crow for surgery on 10/07/2015.  Preop Total Knee orders including Experal, IV Tylenol, and IV Decadron as long as there are no contraindications to the above medications. Arlee Muslim, PA-C

## 2015-09-30 ENCOUNTER — Encounter (HOSPITAL_COMMUNITY)
Admission: RE | Admit: 2015-09-30 | Discharge: 2015-09-30 | Disposition: A | Discharge status: Home / Self Care | Payer: PRIVATE HEALTH INSURANCE | Source: Ambulatory Visit | Attending: Orthopedic Surgery | Admitting: Orthopedic Surgery

## 2015-09-30 ENCOUNTER — Encounter (HOSPITAL_COMMUNITY): Payer: Self-pay

## 2015-09-30 DIAGNOSIS — M179 Osteoarthritis of knee, unspecified: Secondary | ICD-10-CM | POA: Diagnosis not present

## 2015-09-30 DIAGNOSIS — Z01818 Encounter for other preprocedural examination: Secondary | ICD-10-CM | POA: Insufficient documentation

## 2015-09-30 HISTORY — DX: Reserved for inherently not codable concepts without codable children: IMO0001

## 2015-09-30 HISTORY — DX: Other symptoms and signs involving the musculoskeletal system: R29.898

## 2015-09-30 HISTORY — DX: Personal history of systemic steroid therapy: Z92.241

## 2015-09-30 HISTORY — DX: Unspecified hearing loss, unspecified ear: H91.90

## 2015-09-30 LAB — COMPREHENSIVE METABOLIC PANEL
ALT: 31 U/L (ref 17–63)
AST: 27 U/L (ref 15–41)
Albumin: 4.7 g/dL (ref 3.5–5.0)
Alkaline Phosphatase: 85 U/L (ref 38–126)
Anion gap: 8 (ref 5–15)
BUN: 27 mg/dL — ABNORMAL HIGH (ref 6–20)
CO2: 29 mmol/L (ref 22–32)
Calcium: 9.8 mg/dL (ref 8.9–10.3)
Chloride: 102 mmol/L (ref 101–111)
Creatinine, Ser: 1.04 mg/dL (ref 0.61–1.24)
GFR calc Af Amer: >60 mL/min (ref >60)
GFR calc non Af Amer: >60 mL/min (ref >60)
Glucose, Bld: 97 mg/dL (ref 65–99)
Potassium: 4.4 mmol/L (ref 3.5–5.1)
Sodium: 139 mmol/L (ref 135–145)
Total Bilirubin: 0.9 mg/dL (ref 0.3–1.2)
Total Protein: 7.6 g/dL (ref 6.5–8.1)

## 2015-09-30 LAB — URINALYSIS, ROUTINE W REFLEX MICROSCOPIC
Bilirubin Urine: NEGATIVE
Glucose, UA: NEGATIVE mg/dL
Hgb urine dipstick: NEGATIVE
Ketones, ur: NEGATIVE mg/dL
Leukocytes, UA: NEGATIVE
Nitrite: NEGATIVE
Protein, ur: NEGATIVE mg/dL
Specific Gravity, Urine: 1.021 (ref 1.005–1.030)
pH: 6.5 (ref 5.0–8.0)

## 2015-09-30 LAB — CBC
HCT: 46.8 % (ref 39.0–52.0)
Hemoglobin: 16.4 g/dL (ref 13.0–17.0)
MCH: 31.8 pg (ref 26.0–34.0)
MCHC: 35 g/dL (ref 30.0–36.0)
MCV: 90.9 fL (ref 78.0–100.0)
Platelets: 309 10*3/uL (ref 150–400)
RBC: 5.15 MIL/uL (ref 4.22–5.81)
RDW: 12 % (ref 11.5–15.5)
WBC: 11.4 10*3/uL — ABNORMAL HIGH (ref 4.0–10.5)

## 2015-09-30 LAB — SURGICAL PCR SCREEN
MRSA, PCR: NEGATIVE
Staphylococcus aureus: NEGATIVE

## 2015-09-30 LAB — PROTIME-INR
INR: 1.04 (ref 0.00–1.49)
Prothrombin Time: 13.8 seconds (ref 11.6–15.2)

## 2015-09-30 LAB — ABO/RH: ABO/RH(D): O POS

## 2015-09-30 LAB — APTT: aPTT: 27 seconds (ref 24–37)

## 2015-09-30 NOTE — Pre-Procedure Instructions (Addendum)
09-30-15 EKG , Dr. Lenna Gilford clearance note with chart. 09-30-15 1530 Dr. Landry Dyke in to see patient,questions addressed.

## 2015-09-30 NOTE — Patient Instructions (Addendum)
[name], MD  09/30/2015   Your procedure is scheduled on:   10-07-2015 [facility]  Enter through [facility]  Entrance and follow signs to [name] to [place]. Arrive at     0600   AM.  (Limit 1 person with you).  Call this number if you have problems the morning of surgery: [phone]  Or Presurgical Testing [phone] days before.   For Living Will and/or Health Care Power Attorney Forms: please provide copy for your medical record,may bring AM of surgery(Forms should be already notarized -we do not provide this service).(Yes, information provided 12'14 in records).     Do not eat food/ or drink: After Midnight.      Take these medicines the morning of surgery with A SIP OF WATER-   (DO NOT TAKE ANY DIABETIC MEDS AM OF SURGERY) :  Amlodipine. Alprazolam-if need. Wellbutrin. Colchicine. Ranitidine. Crestor. Eye drops.   Do not wear jewelry, make-up or nail polish.  Do not wear deodorant, lotions, powders, or perfumes.   Do not shave legs and under arms- 48 hours(2 days) prior to first CHG shower.(Shaving face and neck okay.)  Do not bring valuables to the hospital.(Hospital is not responsible for lost valuables).  Contacts, dentures or removable bridgework, body piercing, hair pins may not be worn into surgery.  Leave suitcase in the car. After surgery it may be brought to your room.  For patients admitted to the hospital, checkout time is 11:00 AM the day of discharge.(Restricted visitors-Any Persons displaying flu-like symptoms or illness).    Patients discharged the day of surgery will not be allowed to drive home. Must have responsible person with you x 24 hours once discharged.  Name and phone number of your driver: [name]-spouse [phone] cell     Please read over the following fact sheets that you were given:  CHG(Chlorhexidine Gluconate 4% Surgical Soap) use, MRSA Information, Blood Transfusion fact sheet, Incentive Spirometry  Instruction.  Remember : Type/Screen "Blue armbands" - may not be removed once applied(would result in being retested AM of surgery, if removed).         [place] - Preparing for Surgery Before surgery, you can play an important role.  Because skin is not sterile, your skin needs to be as free of germs as possible.  You can reduce the number of germs on your skin by washing with CHG (chlorahexidine gluconate) soap before surgery.  CHG is an antiseptic cleaner which kills germs and bonds with the skin to continue killing germs even after washing. Please DO NOT use if you have an allergy to CHG or antibacterial soaps.  If your skin becomes reddened/irritated stop using the CHG and inform your nurse when you arrive at Short Stay. Do not shave (including legs and underarms) for at least 48 hours prior to the first CHG shower.  You may shave your face/neck. Please follow these instructions carefully:  1.  Shower with CHG Soap the night before surgery and the  morning of Surgery.  2.  If you choose to wash your hair, wash your hair first as usual with your  normal  shampoo.  3.  After you shampoo, rinse your hair and body thoroughly to remove the  shampoo.                           4.  Use CHG as you would any other liquid soap.  You  can apply chg directly  to the skin and wash                       Gently with a scrungie or clean washcloth.  5.  Apply the CHG Soap to your body ONLY FROM THE NECK DOWN.   Do not use on face/ open                           Wound or open sores. Avoid contact with eyes, ears mouth and genitals (private parts).                       Wash face,  Genitals (private parts) with your normal soap.             6.  Wash thoroughly, paying special attention to the area where your surgery  will be performed.  7.  Thoroughly rinse your body with warm water from the neck down.  8.  DO NOT shower/wash with your normal soap after using and rinsing off  the CHG Soap.                 9.  Pat yourself dry with a clean towel.            10.  Wear clean pajamas.            11.  Place clean sheets on your bed the night of your first shower and do not  sleep with pets. Day of Surgery : Do not apply any lotions/deodorants the morning of surgery.  Please wear clean clothes to the hospital/surgery center.  FAILURE TO FOLLOW THESE INSTRUCTIONS MAY RESULT IN THE CANCELLATION OF YOUR SURGERY PATIENT SIGNATURE_________________________________  NURSE SIGNATURE__________________________________  ________________________________________________________________________   Peter [name]  An incentive spirometer is a tool that can help keep your lungs clear and active. This tool measures how well you are filling your lungs with each breath. Taking long deep breaths may help reverse or decrease the chance of developing breathing (pulmonary) problems (especially infection) following:  A long period of time when you are unable to move or be active. BEFORE THE PROCEDURE   If the spirometer includes an indicator to show your best effort, your nurse or respiratory therapist will set it to a desired goal.  If possible, sit up straight or lean slightly forward. Try not to slouch.  Hold the incentive spirometer in an upright position. INSTRUCTIONS FOR USE   Sit on the edge of your bed if possible, or sit up as far as you can in bed or on a chair.  Hold the incentive spirometer in an upright position.  Breathe out normally.  Place the mouthpiece in your mouth and seal your lips tightly around it.  Breathe in slowly and as deeply as possible, raising the piston or the ball toward the top of the column.  Hold your breath for 3-5 seconds or for as long as possible. Allow the piston or ball to fall to the bottom of the column.  Remove the mouthpiece from your mouth and breathe out normally.  Rest for a few seconds and repeat Steps 1 through 7 at least 10 times every 1-2 hours  when you are awake. Take your time and take a few normal breaths between deep breaths.  The spirometer may include an indicator to show your best effort. Use the indicator as a goal to work toward  during each repetition.  After each set of 10 deep breaths, practice coughing to be sure your lungs are clear. If you have an incision (the cut made at the time of surgery), support your incision when coughing by placing a pillow or rolled up towels firmly against it. Once you are able to get out of bed, walk around indoors and cough well. You may stop using the incentive spirometer when instructed by your caregiver.  RISKS AND COMPLICATIONS  Take your time so you do not get dizzy or light-headed.  If you are in pain, you may need to take or ask for pain medication before doing incentive spirometry. It is harder to take a deep breath if you are having pain. AFTER USE  Rest and breathe slowly and easily.  It can be helpful to keep track of a log of your progress. Your caregiver can provide you with a simple table to help with this. If you are using the spirometer at home, follow these instructions: [place] IF:   You are having difficultly using the spirometer.  You have trouble using the spirometer as often as instructed.  Your pain medication is not giving enough relief while using the spirometer.  You develop fever of 100.5 F (38.1 C) or higher. SEEK IMMEDIATE MEDICAL CARE IF:   You cough up bloody sputum that had not been present before.  You develop fever of 102 F (38.9 C) or greater.  You develop worsening pain at or near the incision site. MAKE SURE YOU:   Understand these instructions.  Will watch your condition.  Will get help right away if you are not doing well or get worse. Document Released: 02/22/2007 Document Revised: 01/04/2012 Document Reviewed: 04/25/2007 ExitCare Patient Information 2014 ExitCare,  [place].   ________________________________________________________________________  WHAT IS A BLOOD TRANSFUSION? Blood Transfusion Information  A transfusion is the replacement of blood or some of its parts. Blood is made up of multiple cells which provide different functions.  Red blood cells carry oxygen and are used for blood loss replacement.  White blood cells fight against infection.  Platelets control bleeding.  Plasma helps clot blood.  Other blood products are available for specialized needs, such as hemophilia or other clotting disorders. BEFORE THE TRANSFUSION  Who gives blood for transfusions?   Healthy volunteers who are fully evaluated to make sure their blood is safe. This is blood bank blood. Transfusion therapy is the safest it has ever been in the practice of medicine. Before blood is taken from a donor, a complete history is taken to make sure that person has no history of diseases nor engages in risky social behavior (examples are intravenous drug use or sexual activity with multiple partners). The donor's travel history is screened to minimize risk of transmitting infections, such as malaria. The donated blood is tested for signs of infectious diseases, such as HIV and hepatitis. The blood is then tested to be sure it is compatible with you in order to minimize the chance of a transfusion reaction. If you or a relative donates blood, this is often done in anticipation of surgery and is not appropriate for emergency situations. It takes many days to process the donated blood. RISKS AND COMPLICATIONS Although transfusion therapy is very safe and saves many lives, the main dangers of transfusion include:   Getting an infectious disease.  Developing a transfusion reaction. This is an allergic reaction to something in the blood you were given. Every precaution is taken to prevent  this. The decision to have a blood transfusion has been considered carefully by your caregiver  before blood is given. Blood is not given unless the benefits outweigh the risks. AFTER THE TRANSFUSION  Right after receiving a blood transfusion, you will usually feel much better and more energetic. This is especially true if your red blood cells have gotten low (anemic). The transfusion raises the level of the red blood cells which carry oxygen, and this usually causes an energy increase.  The nurse administering the transfusion will monitor you carefully for complications. HOME CARE INSTRUCTIONS  No special instructions are needed after a transfusion. You may find your energy is better. Speak with your caregiver about any limitations on activity for underlying diseases you may have. SEEK MEDICAL CARE IF:   Your condition is not improving after your transfusion.  You develop redness or irritation at the intravenous (IV) site. SEEK IMMEDIATE MEDICAL CARE IF:  Any of the following symptoms occur over the next 12 hours:  Shaking chills.  You have a temperature by mouth above 102 F (38.9 C), not controlled by medicine.  Chest, back, or muscle pain.  People around you feel you are not acting correctly or are confused.  Shortness of breath or difficulty breathing.  Dizziness and fainting.  You get a rash or develop hives.  You have a decrease in urine output.  Your urine turns a dark color or changes to pink, red, or brown. Any of the following symptoms occur over the next 10 days:  You have a temperature by mouth above 102 F (38.9 C), not controlled by medicine.  Shortness of breath.  Weakness after normal activity.  The white part of the eye turns yellow (jaundice).  You have a decrease in the amount of urine or are urinating less often.  Your urine turns a dark color or changes to pink, red, or brown. Document Released: 10/09/2000 Document Revised: 01/04/2012 Document Reviewed: 05/28/2008 [facility] Patient Information 2014 [place],  [place].  _______________________________________________________________________

## 2015-10-01 NOTE — Progress Notes (Signed)
10-01-15 labs viewable in Epic, note BUN.

## 2015-10-06 ENCOUNTER — Ambulatory Visit: Payer: Self-pay | Admitting: Orthopedic Surgery

## 2015-10-06 NOTE — Anesthesia Preprocedure Evaluation (Addendum)
Anesthesia Evaluation  Patient identified by MRN, date of birth, ID band Patient awake    Reviewed: NPO status , Patient's Chart, lab work & pertinent test results, reviewed documented beta blocker date and time   History of Anesthesia Complications (+) history of anesthetic complications  Airway Mallampati: I  TM Distance: >3 FB Neck ROM: Full    Dental  (+) Teeth Intact   Pulmonary neg pulmonary ROS,    breath sounds clear to auscultation       Cardiovascular hypertension, Pt. on medications + Valvular Problems/Murmurs  Rhythm:Regular Rate:Normal     Neuro/Psych PSYCHIATRIC DISORDERS Anxiety negative neurological ROS     GI/Hepatic hiatal hernia, GERD  Medicated,  Endo/Other    Renal/GU   negative genitourinary   Musculoskeletal  (+) Arthritis , Osteoarthritis,    Abdominal   Peds negative pediatric ROS (+)  Hematology   Anesthesia Other Findings - Trivial TR - Cx of Anesthesia --> Itching - TMJ Disorder   Reproductive/Obstetrics                            Lab Results  Component Value Date   WBC 11.4* 09/30/2015   HGB 16.4 09/30/2015   HCT 46.8 09/30/2015   MCV 90.9 09/30/2015   PLT 309 09/30/2015   Lab Results  Component Value Date   CREATININE 1.04 09/30/2015   BUN 27* 09/30/2015   NA 139 09/30/2015   K 4.4 09/30/2015   CL 102 09/30/2015   CO2 29 09/30/2015   Lab Results  Component Value Date   INR 1.04 09/30/2015   EKG: NSR  Echo 06/03/15 - Left ventricle: The cavity size was normal. Wall thickness was normal. Systolic function was vigorous. The estimated ejection fraction was in the range of 65% to 70%. Wall motion was normal; there were no regional wall motion abnormalities. Doppler parameters are consistent with abnormal left ventricular relaxation (grade 1 diastolic dysfunction). There was no evidence of elevated ventricular filling pressure by  Doppler parameters. - Aortic valve: Structurally normal valve. There was no significant regurgitation. - Aortic root: The aortic root was normal in size. - Ascending aorta: The ascending aorta was normal in size. - Mitral valve: Structurally normal valve. There was no regurgitation. - Left atrium: The atrium was normal in size. - Right atrium: The atrium was normal in size. - Atrial septum: No defect or patent foramen ovale was identified. - Tricuspid valve: Structurally normal valve. There was mild regurgitation. - Pulmonic valve: Structurally normal valve. - Pulmonary arteries: Systolic pressure was within the normal range. - Inferior vena cava: The vessel was normal in size. The respirophasic diameter changes were in the normal range (= 50%), consistent with normal central venous pressure.   Anesthesia Physical Anesthesia Plan  ASA: III  Anesthesia Plan: Spinal   Post-op Pain Management:    Induction: Intravenous  Airway Management Planned: Natural Airway  Additional Equipment:   Intra-op Plan:   Post-operative Plan:   Informed Consent: I have reviewed the patients History and Physical, chart, labs and discussed the procedure including the risks, benefits and alternatives for the proposed anesthesia with the patient or authorized representative who has indicated his/her understanding and acceptance.   Dental advisory given  Plan Discussed with: CRNA  Anesthesia Plan Comments:         Anesthesia Quick Evaluation

## 2015-10-06 NOTE — H&P (Signed)
Peter Bachelor MD DOB: [birth year] Single / Language: Peter [name] / Race: White Male Date of Admission:  10/07/2015 CC:  Right Knee Pain History of Present Illness The patient is a 69 year old male who comes in for a preoperative History and Physical. The patient is scheduled for a right total knee arthroplasty to be performed by Dr. Dione Plover. Aluisio, MD at [facility] on 10-07-2015. The patient reports left knee and right knee (worse) symptoms including: pain and swelling . Prior to being seen, the patient was previously evaluated in this clinic. Previous work-up for this problem has included knee x-rays (07/10/15 in [place]) and knee MRI (about 1 year ago following acute pseudo gout attack of the right knee). Past treatment for this problem has included intra-articular injection of corticosteroids (has had several aspirations and cortisone injections, with recent being 12/17/14 with Dr. Amedeo Plenty). Current treatment includes knee brace (very helpful, wears to work, without it has sudden intense sharp pains) and nonsteroidal anti-inflammatory drugs (Celebrex 2x/daily from rheumatologist). 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 He has affected multiple joints. He has had multiple surgeries including left shoulder and lower back operations. He states that the knees are his biggest problem now. The right knee is far worse than the left. Dr. Amedeo Plenty has performed aspiration and cortisone injection on several occasions. Last one was about six months ago. Peter [name] feels like the right knee is limiting what he can and cannot do. He used to have extremely active lifestyle and is no longer doing so because of the knee. Pain occurs throughout the day and sometimes occurs at night. Worsening the pain with a little functional limitations. His radiographs that were taken at [facility] radiology showed that he has bone-on-bone arthritis in the  medial and patellofemoral compartments of that right knee. He also has pretty significant chondrocalcinosis laterally. The left knee show some medial narrowing, but not bone-on-bone. Chondrocalcinosis also present on the left. At this point, the most predictable means of improving his pain and functions could be total knee arthroplasty. We discussed that in detail and he wants to go ahead and proceed. I told him that the success rate is extremely high in CPPD disease, but occasionally I have seen the patient or two that have had some postop synovitis from the crystals. We will do a complete synovectomy at the time of surgery, but still the tissue sometimes will recur and he can get some crystal disease, but it is unlikely. He definitely wants to go ahead and get this fixed. They have been treated conservatively in the past for the above stated problem and despite conservative measures, they continue to have progressive pain and severe functional limitations and dysfunction. They have failed non-operative management including home exercise, medications, and injections. It is felt that they would benefit from undergoing total joint replacement. Risks and benefits of the procedure have been discussed with the patient and they elect to proceed with surgery. There are no active contraindications to surgery such as ongoing infection or rapidly progressive neurological disease.  Problem List/Past Medical Trigger middle finger of right hand (M65.331)  Chronic pain of both knees (M25.561, M25.562)  Primary localized osteoarthritis of left knee (M17.12)  Pain, hand joint, right (M25.541)  Chronic pain syndrome (G89.4)  Pain of right middle finger (M79.644)  Impaired Vision  wears glasses Impaired Hearing  hearing aids Meniere's Disease  Hypertension  Hypercholesterolemia  Rheumatic Fever  Childhood Illness  Heart murmur  Trivial Tricuspid Regurg Hiatal Hernia  Gastroesophageal Reflux Disease   Skin Cancer  Melanoma Degenerative Disc Disease  Pseudogout  CPPD Colon Polyps  Allergic Rhinitis  Coronary Artery Disease  Mild - 2008 Slight plaque in LAD, mixed plaque in CIRC. Venous Insufficiency  Spondylosis  Anxiety Disorder  TMJ Syndrome  Right-sided Ejection Fraction  55% per Echo March 2008 History of PVC's   Allergies  Gentamicin 0.4mg /ml in Saline *AMINOGLYCOSIDES*  "strong allergy" Adhesive 1"x6yd *MEDICAL DEVICES*  Kenalog *CORTICOSTEROIDS*  Bacitracin *ANTI-INFECTIVE AGENTS - MISC.*  Neosporin *OPHTHALMIC AGENTS*  Moxifloxacin HCl *CHEMICALS*  Hydrocodone w/APAP *ANALGESICS - OPIOID*  Rash. Please note that the patient was able to take hydrocodone cough syrup without difficulty since then.  Family History Heart Disease  Brother. Hypertension  Mother. Osteoarthritis  Brother, Father. Alzheimer's disease  Mother, Father. Osteoarthritis  Sibling. Three  Social History  Tobacco use  Never smoker. 09/24/2014 Current drinker  09/24/2014: Currently drinks wine 8-14 times per week Living situation  live with spouse Marital status  married Number of flights of stairs before winded  greater than 5 No history of drug/alcohol rehab  Not under pain contract  Most recent primary occupation  Diagnostic Radiologist Exercise  Exercises rarely; does running / walking and gym / weights Children  2 - Son and Daughter Current work status  working part time [employer] following the Right Total Knee Repalcement 10-07-2015 [employer] Current occupation  Physician - Diagnostic Radiologist  Medication History  Xanax (0.5MG  Tablet, 1/2 - 1 Oral tid prn) Active. Norvasc (10MG  Tablet, Oral daily) Active. Aspirin (81MG  Tablet, 1 (one) Oral daily) Active. Wellbutrin (100MG  Tablet, 1 (one) Oral two times daily, Taken starting 03/21/2015) Active. CeleBREX (200MG  Capsule, Oral two times  daily) Active. Colchicine (0.6MG  Capsule, Oral two times daily) Active. Restasis (0.05% Emulsion, 1 drop both eyes Ophthalmic two times daily) Active. Hydrochlorothiazide (25MG  Tablet, Oral daily) Active. [facility] Tablet ER, Oral daily) Active. Accupril (10MG  Tablet, Oral daily) Active. Zantac (150MG  Tablet, Oral two times daily) Active. TraMADol HCl (50MG  Tablet, 1-2 Oral daily as needed) Active. (PRN) Crestor (10MG  Tablet, Oral at bedtime) Active. Multi-Day (Oral daily) Active.  Past Surgical History Rotator Cuff Repair  Date: 07/2011. left - Dr. Durward Fortes Neck Disc Surgery  Date: 2003. Dr. Rita Ohara Carpal Tunnel Repair  Date: 2015. bilateral - Dr. Daylene Katayama Spinal Decompression  Date: 09/2013. Two Level Fusion (L3-4, L4-5) - Dr. Rita Ohara Spinal Surgery  Spinal Fusion  neck and lower back Melanoma Resections  2002, 2003  Review of Systems General Not Present- Chills, Fatigue, Fever, Memory Loss, Night Sweats, Weight Gain and Weight Loss. Skin Not Present- Eczema, Hives, Itching, Lesions and Rash. HEENT Present- Hearing Loss (uses bilateral hearing aids). Not Present- Dentures, Double Vision, Headache, Tinnitus and Visual Loss. Respiratory Not Present- Allergies, Chronic Cough, Coughing up blood, Shortness of breath at rest and Shortness of breath with exertion. Cardiovascular Not Present- Chest Pain, Difficulty Breathing Lying Down, Murmur, Palpitations, Racing/skipping heartbeats and Swelling. Gastrointestinal Not Present- Abdominal Pain, Bloody Stool, Constipation, Diarrhea, Difficulty Swallowing, Heartburn, Jaundice, Loss of appetitie, Nausea and Vomiting. Male Genitourinary Not Present- Blood in Urine, Discharge, Flank Pain, Incontinence, Painful Urination, Urgency, Urinary frequency, Urinary Retention, Urinating at Night and Weak urinary stream. Musculoskeletal Present- Back Pain, Joint Pain and Morning Stiffness. Not Present- Joint Swelling, Muscle Pain, Muscle  Weakness and Spasms. Neurological Not Present- Blackout spells, Difficulty with balance, Dizziness, Paralysis, Tremor and Weakness. Psychiatric Not Present-  Insomnia.  Vitals Weight: 170 lb Height: 69in Weight was reported by patient. Height was reported by patient. Body Surface Area: 1.93 m Body Mass Index: 25.1 kg/m  BP: 138/80 (Sitting, Right Arm, Standard)  Physical Exam General Mental Status -Alert, cooperative and good historian. General Appearance-pleasant, Not in acute distress. Orientation-Oriented X3. Build & Nutrition-Well nourished and Well developed.  Head and Neck Head-normocephalic, atraumatic . Neck Global Assessment - supple, no bruit auscultated on the right, no bruit auscultated on the left.  Eye Vision-Wears corrective lenses. Pupil - Bilateral-Regular and Round. Motion - Bilateral-EOMI.  Chest and Lung Exam Auscultation Breath sounds - clear at anterior chest wall and clear at posterior chest wall. Adventitious sounds - No Adventitious sounds.  Cardiovascular Auscultation Rhythm - Regular rate and rhythm. Heart Sounds - S1 WNL and S2 WNL. Murmurs & Other Heart Sounds - Auscultation of the heart reveals - No Murmurs.  Abdomen Palpation/Percussion Tenderness - Abdomen is non-tender to palpation. Rigidity (guarding) - Abdomen is soft. Auscultation Auscultation of the abdomen reveals - Bowel sounds normal.  Male Genitourinary Note: Not done, not pertinent to present illness   Musculoskeletal Note: On exam, he is alert and oriented, in no apparent distress. His hips show normal range of motion with no discomfort. His left knee shows no effusion. Range on the left is 0 to 130. There is some crepitus on range of motion. He does not have any deformity. He has no instability. His right knee shows slight swelling, significant varus. Range of motion about 5 to 125. Marked crepitus on range of motion. Tenderness medial greater than  lateral with no instability noted. Pulse sensation and motor intact in both lower extremities. Gait pattern shows a pretty significant varus thrust on that right knee.  RADIOGRAPHS His radiographs that were taken at [facility] radiology showed that he has bone-on-bone arthritis in the medial and patellofemoral compartments of that right knee. He also has pretty significant chondrocalcinosis laterally. The left knee show some medial narrowing, but not bone-on-bone. Chondrocalcinosis also present on the left.  Assessment & Plan  Primary localized osteoarthritis of right knee (M17.11)  Note:Surgical Plans: Right Total Knee Replacement  Disposition: Home with wife  PCP: Dr. Lenna Gilford - Patient has been seen preoperatively and felt to be stable for surgery.  Topical TXA - History of Mild CAD and Melanoma  Anesthesia Issues: He had horrible chills with the anesthesia associated with his left shoulder surgery in 2010, but did okay with the anesthesia in Dec 2014 for his back surgery. He did have a strain or pain associated with the right TMJ following intubation for the back surgery.  His Rheumatologist ask for pictures of the joint tissue and crystal deposits and also evaluation of the tissue to confirm the diagnosis of CCPD at time of surgery.  Comments: Questionable suture material reaction following Left Rotator Cuff Repair in October 2012.  Operative Report 08/13/2011 - Left Rotator Cuff Repair - "Fibers were debrided back to bleeding tissue and then repaired in several layers with 0 Ethibond and 0 Vicryl.Marland KitchenMarland KitchenMarland KitchenThe deltoid fascia was closed with a running 0 Vicryl subcu with 3-0 Monocryl skin closed with Steri-strips over benzoin..." Operative Report 09/01/2011 - Left Shoulder I&D, Removal of Etibond Sutures, Rerepair of Left Rotaotr Cuff Tear - "Several Ethibond sutures wre identified and removed from the wound.Marland KitchenMarland KitchenThe rotator cuff tear was identified and repaired with interrupted #25-gauge  stainless steeel wire.Marland KitchenMarland KitchenThe deltoid fascia was closed with the same 25-gauge wire. Skin was closed with interrupted 3-0 nylon over  a penrose drain.'  Opertaive Report 10/02/2013 - Lumbar Surgery - Dr. Rita Ohara - "The paraspinal muscles, deep fascia, and Scarpa's fascia were closed with interrupted undyed 1 Vicryl sutures, the subcutaneous and subcuticular closed with interrupted inverted 2-0 undyed Vicryl sutures.Marland KitchenMarland KitchenThe skin edges were approximated with Dermabond. The drain was sutured in place with a 3-0 nylon suture."  Signed electronically by Joelene Millin, III PA-C

## 2015-10-07 ENCOUNTER — Inpatient Hospital Stay (HOSPITAL_COMMUNITY): Payer: PRIVATE HEALTH INSURANCE | Admitting: Anesthesiology

## 2015-10-07 ENCOUNTER — Encounter (HOSPITAL_COMMUNITY)
Admission: RE | Disposition: A | Discharge status: Home Health | Payer: Self-pay | Source: Ambulatory Visit | Attending: Orthopedic Surgery

## 2015-10-07 ENCOUNTER — Inpatient Hospital Stay (HOSPITAL_COMMUNITY)
Admission: RE | Admit: 2015-10-07 | Discharge: 2015-10-09 | DRG: 470 | Disposition: A | Discharge status: Home Health | Payer: PRIVATE HEALTH INSURANCE | Source: Ambulatory Visit | Attending: Orthopedic Surgery | Admitting: Orthopedic Surgery

## 2015-10-07 ENCOUNTER — Encounter (HOSPITAL_COMMUNITY): Payer: Self-pay | Admitting: *Deleted

## 2015-10-07 DIAGNOSIS — Z85820 Personal history of malignant melanoma of skin: Secondary | ICD-10-CM

## 2015-10-07 DIAGNOSIS — I10 Essential (primary) hypertension: Secondary | ICD-10-CM | POA: Diagnosis present

## 2015-10-07 DIAGNOSIS — M25561 Pain in right knee: Secondary | ICD-10-CM | POA: Diagnosis present

## 2015-10-07 DIAGNOSIS — M1710 Unilateral primary osteoarthritis, unspecified knee: Secondary | ICD-10-CM | POA: Diagnosis present

## 2015-10-07 DIAGNOSIS — H9193 Unspecified hearing loss, bilateral: Secondary | ICD-10-CM | POA: Diagnosis present

## 2015-10-07 DIAGNOSIS — M170 Bilateral primary osteoarthritis of knee: Secondary | ICD-10-CM | POA: Diagnosis present

## 2015-10-07 DIAGNOSIS — Z7982 Long term (current) use of aspirin: Secondary | ICD-10-CM

## 2015-10-07 DIAGNOSIS — Z01812 Encounter for preprocedural laboratory examination: Secondary | ICD-10-CM | POA: Diagnosis not present

## 2015-10-07 DIAGNOSIS — M1711 Unilateral primary osteoarthritis, right knee: Secondary | ICD-10-CM

## 2015-10-07 DIAGNOSIS — K219 Gastro-esophageal reflux disease without esophagitis: Secondary | ICD-10-CM | POA: Diagnosis present

## 2015-10-07 DIAGNOSIS — M179 Osteoarthritis of knee, unspecified: Secondary | ICD-10-CM | POA: Diagnosis present

## 2015-10-07 DIAGNOSIS — Z79899 Other long term (current) drug therapy: Secondary | ICD-10-CM

## 2015-10-07 DIAGNOSIS — E7800 Pure hypercholesterolemia, unspecified: Secondary | ICD-10-CM | POA: Diagnosis present

## 2015-10-07 HISTORY — PX: TOTAL KNEE ARTHROPLASTY: SHX125

## 2015-10-07 LAB — TYPE AND SCREEN
ABO/RH(D): O POS
Antibody Screen: NEGATIVE

## 2015-10-07 SURGERY — ARTHROPLASTY, KNEE, TOTAL
Anesthesia: Spinal | Site: Knee | Laterality: Right

## 2015-10-07 MED ORDER — ROSUVASTATIN CALCIUM 10 MG PO TABS
10.0000 mg | ORAL_TABLET | Freq: Every day | ORAL | Status: DC
Start: 2015-10-08 — End: 2015-10-09
  Administered 2015-10-08: 10 mg via ORAL
  Filled 2015-10-07 (×2): qty 1

## 2015-10-07 MED ORDER — HYDROCHLOROTHIAZIDE 25 MG PO TABS
25.0000 mg | ORAL_TABLET | Freq: Every day | ORAL | Status: DC
  Filled 2015-10-07 (×2): qty 1

## 2015-10-07 MED ORDER — BUPIVACAINE HCL (PF) 0.75 % IJ SOLN
INTRAMUSCULAR | Status: DC | PRN
  Administered 2015-10-07: 2 mL via INTRATHECAL

## 2015-10-07 MED ORDER — MORPHINE SULFATE (PF) 2 MG/ML IV SOLN
1.0000 mg | INTRAVENOUS | Status: DC | PRN
Start: 2015-10-07 — End: 2015-10-09

## 2015-10-07 MED ORDER — FENTANYL CITRATE (PF) 100 MCG/2ML IJ SOLN
INTRAMUSCULAR | Status: AC
  Filled 2015-10-07: qty 2

## 2015-10-07 MED ORDER — PROPOFOL 10 MG/ML IV BOLUS
INTRAVENOUS | Status: DC | PRN
  Administered 2015-10-07 (×4): 10 mg via INTRAVENOUS
  Administered 2015-10-07: 20 mg via INTRAVENOUS

## 2015-10-07 MED ORDER — PROMETHAZINE HCL 25 MG/ML IJ SOLN: 6.2500 mg | INTRAMUSCULAR | Status: DC | PRN

## 2015-10-07 MED ORDER — AMLODIPINE BESYLATE 10 MG PO TABS
10.0000 mg | ORAL_TABLET | Freq: Every day | ORAL | Status: DC
  Administered 2015-10-08 – 2015-10-09 (×2): 10 mg via ORAL
  Filled 2015-10-07 (×2): qty 1

## 2015-10-07 MED ORDER — ONDANSETRON HCL 4 MG PO TABS
4.0000 mg | ORAL_TABLET | Freq: Four times a day (QID) | ORAL | Status: DC | PRN
  Filled 2015-10-07: qty 1

## 2015-10-07 MED ORDER — METOCLOPRAMIDE HCL 5 MG/ML IJ SOLN: 5.0000 mg | Freq: Three times a day (TID) | INTRAMUSCULAR | Status: DC | PRN

## 2015-10-07 MED ORDER — TRANEXAMIC ACID 1000 MG/10ML IV SOLN
2000.0000 mg | INTRAVENOUS | Status: DC | PRN
  Administered 2015-10-07: 2000 mg via TOPICAL

## 2015-10-07 MED ORDER — CEFAZOLIN SODIUM-DEXTROSE 2-3 GM-% IV SOLR
2.0000 g | INTRAVENOUS | Status: AC
  Administered 2015-10-07: 2 g via INTRAVENOUS

## 2015-10-07 MED ORDER — CEFAZOLIN SODIUM-DEXTROSE 2-3 GM-% IV SOLR
INTRAVENOUS | Status: AC
  Filled 2015-10-07: qty 50

## 2015-10-07 MED ORDER — ACETAMINOPHEN 650 MG RE SUPP: 650.0000 mg | Freq: Four times a day (QID) | RECTAL | Status: DC | PRN

## 2015-10-07 MED ORDER — CYCLOSPORINE 0.05 % OP EMUL
1.0000 [drp] | Freq: Two times a day (BID) | OPHTHALMIC | Status: DC
  Administered 2015-10-08 – 2015-10-09 (×3): 1 [drp] via OPHTHALMIC
  Filled 2015-10-07 (×5): qty 1

## 2015-10-07 MED ORDER — LACTATED RINGERS IV SOLN: INTRAVENOUS | Status: DC

## 2015-10-07 MED ORDER — METHOCARBAMOL 500 MG PO TABS
500.0000 mg | ORAL_TABLET | Freq: Four times a day (QID) | ORAL | Status: DC | PRN
  Administered 2015-10-08 – 2015-10-09 (×5): 500 mg via ORAL
  Filled 2015-10-07 (×5): qty 1

## 2015-10-07 MED ORDER — ACETAMINOPHEN 500 MG PO TABS
1000.0000 mg | ORAL_TABLET | Freq: Four times a day (QID) | ORAL | Status: AC
  Administered 2015-10-07 – 2015-10-08 (×4): 1000 mg via ORAL
  Filled 2015-10-07 (×4): qty 2

## 2015-10-07 MED ORDER — FENTANYL CITRATE (PF) 100 MCG/2ML IJ SOLN
INTRAMUSCULAR | Status: DC | PRN
  Administered 2015-10-07 (×2): 50 ug via INTRAVENOUS

## 2015-10-07 MED ORDER — BUPIVACAINE LIPOSOME 1.3 % IJ SUSP
20.0000 mL | Freq: Once | INTRAMUSCULAR | Status: DC
  Filled 2015-10-07: qty 20

## 2015-10-07 MED ORDER — METOCLOPRAMIDE HCL 10 MG PO TABS: 5.0000 mg | ORAL_TABLET | Freq: Three times a day (TID) | ORAL | Status: DC | PRN

## 2015-10-07 MED ORDER — BUPIVACAINE LIPOSOME 1.3 % IJ SUSP
INTRAMUSCULAR | Status: DC | PRN
  Administered 2015-10-07: 20 mL

## 2015-10-07 MED ORDER — POLYETHYLENE GLYCOL 3350 17 G PO PACK: 17.0000 g | PACK | Freq: Every day | ORAL | Status: DC | PRN

## 2015-10-07 MED ORDER — ACETAMINOPHEN 325 MG PO TABS
650.0000 mg | ORAL_TABLET | Freq: Four times a day (QID) | ORAL | Status: DC | PRN
  Administered 2015-10-08: 650 mg via ORAL
  Filled 2015-10-07: qty 2

## 2015-10-07 MED ORDER — KETOROLAC TROMETHAMINE 15 MG/ML IJ SOLN
7.5000 mg | Freq: Four times a day (QID) | INTRAMUSCULAR | Status: AC | PRN
  Administered 2015-10-07: 7.5 mg via INTRAVENOUS
  Filled 2015-10-07: qty 1

## 2015-10-07 MED ORDER — PROPOFOL 500 MG/50ML IV EMUL
INTRAVENOUS | Status: DC | PRN
  Administered 2015-10-07: 75 ug/kg/min via INTRAVENOUS

## 2015-10-07 MED ORDER — POTASSIUM CHLORIDE CRYS ER 20 MEQ PO TBCR
20.0000 meq | EXTENDED_RELEASE_TABLET | Freq: Every day | ORAL | Status: DC
  Administered 2015-10-08 – 2015-10-09 (×2): 20 meq via ORAL
  Filled 2015-10-07 (×2): qty 1

## 2015-10-07 MED ORDER — DIPHENHYDRAMINE HCL 12.5 MG/5ML PO ELIX: 12.5000 mg | ORAL_SOLUTION | ORAL | Status: DC | PRN

## 2015-10-07 MED ORDER — MIDAZOLAM HCL 5 MG/5ML IJ SOLN
INTRAMUSCULAR | Status: DC | PRN
  Administered 2015-10-07: 2 mg via INTRAVENOUS

## 2015-10-07 MED ORDER — TRANEXAMIC ACID 1000 MG/10ML IV SOLN
2000.0000 mg | Freq: Once | INTRAVENOUS | Status: DC
  Filled 2015-10-07: qty 20

## 2015-10-07 MED ORDER — METHOCARBAMOL 1000 MG/10ML IJ SOLN
500.0000 mg | Freq: Four times a day (QID) | INTRAVENOUS | Status: DC | PRN
  Administered 2015-10-07: 500 mg via INTRAVENOUS
  Filled 2015-10-07 (×3): qty 5

## 2015-10-07 MED ORDER — ONDANSETRON HCL 4 MG/2ML IJ SOLN: 4.0000 mg | Freq: Four times a day (QID) | INTRAMUSCULAR | Status: DC | PRN

## 2015-10-07 MED ORDER — SODIUM CHLORIDE 0.9 % IJ SOLN
INTRAMUSCULAR | Status: AC
  Filled 2015-10-07: qty 10

## 2015-10-07 MED ORDER — MENTHOL 3 MG MT LOZG
1.0000 | LOZENGE | OROMUCOSAL | Status: DC | PRN
Start: 2015-10-07 — End: 2015-10-09

## 2015-10-07 MED ORDER — EPHEDRINE SULFATE 50 MG/ML IJ SOLN
INTRAMUSCULAR | Status: AC
Start: 2015-10-07 — End: 2015-10-07
  Filled 2015-10-07: qty 1

## 2015-10-07 MED ORDER — MIDAZOLAM HCL 2 MG/2ML IJ SOLN
INTRAMUSCULAR | Status: AC
  Filled 2015-10-07: qty 2

## 2015-10-07 MED ORDER — HYDROMORPHONE HCL 1 MG/ML IJ SOLN: 0.2500 mg | INTRAMUSCULAR | Status: DC | PRN

## 2015-10-07 MED ORDER — BISACODYL 10 MG RE SUPP: 10.0000 mg | Freq: Every day | RECTAL | Status: DC | PRN

## 2015-10-07 MED ORDER — SODIUM CHLORIDE 0.9 % IJ SOLN
INTRAMUSCULAR | Status: DC | PRN
  Administered 2015-10-07: 30 mL

## 2015-10-07 MED ORDER — COLCHICINE 0.6 MG PO TABS
0.6000 mg | ORAL_TABLET | Freq: Two times a day (BID) | ORAL | Status: DC
  Administered 2015-10-07 – 2015-10-09 (×4): 0.6 mg via ORAL
  Filled 2015-10-07 (×5): qty 1

## 2015-10-07 MED ORDER — ONDANSETRON HCL 4 MG/2ML IJ SOLN
INTRAMUSCULAR | Status: DC | PRN
  Administered 2015-10-07: 4 mg via INTRAVENOUS

## 2015-10-07 MED ORDER — OXYCODONE HCL 5 MG PO TABS
5.0000 mg | ORAL_TABLET | ORAL | Status: DC | PRN
  Administered 2015-10-07: 5 mg via ORAL
  Administered 2015-10-07 – 2015-10-09 (×11): 10 mg via ORAL
  Filled 2015-10-07 (×7): qty 2
  Filled 2015-10-07: qty 1
  Filled 2015-10-07 (×4): qty 2

## 2015-10-07 MED ORDER — TRAMADOL HCL 50 MG PO TABS: 50.0000 mg | ORAL_TABLET | Freq: Four times a day (QID) | ORAL | Status: DC | PRN

## 2015-10-07 MED ORDER — FLEET ENEMA 7-19 GM/118ML RE ENEM: 1.0000 | ENEMA | Freq: Once | RECTAL | Status: DC | PRN

## 2015-10-07 MED ORDER — CEFAZOLIN SODIUM-DEXTROSE 2-3 GM-% IV SOLR
2.0000 g | Freq: Four times a day (QID) | INTRAVENOUS | Status: AC
  Administered 2015-10-07 (×2): 2 g via INTRAVENOUS
  Filled 2015-10-07 (×4): qty 50

## 2015-10-07 MED ORDER — LACTATED RINGERS IV SOLN
INTRAVENOUS | Status: DC | PRN
  Administered 2015-10-07 (×3): via INTRAVENOUS

## 2015-10-07 MED ORDER — ACETAMINOPHEN 10 MG/ML IV SOLN
1000.0000 mg | Freq: Once | INTRAVENOUS | Status: AC
  Administered 2015-10-07: 1000 mg via INTRAVENOUS
  Filled 2015-10-07: qty 100

## 2015-10-07 MED ORDER — MEPERIDINE HCL 50 MG/ML IJ SOLN: 6.2500 mg | INTRAMUSCULAR | Status: DC | PRN

## 2015-10-07 MED ORDER — BUPROPION HCL 75 MG PO TABS
75.0000 mg | ORAL_TABLET | Freq: Every day | ORAL | Status: DC
  Administered 2015-10-08 – 2015-10-09 (×2): 75 mg via ORAL
  Filled 2015-10-07 (×3): qty 1

## 2015-10-07 MED ORDER — ATROPINE SULFATE 0.4 MG/ML IJ SOLN
INTRAMUSCULAR | Status: AC
  Filled 2015-10-07: qty 1

## 2015-10-07 MED ORDER — PROPOFOL 10 MG/ML IV BOLUS
INTRAVENOUS | Status: AC
  Filled 2015-10-07: qty 40

## 2015-10-07 MED ORDER — DEXTROSE-NACL 5-0.9 % IV SOLN
INTRAVENOUS | Status: DC
  Administered 2015-10-07: 12:00:00 via INTRAVENOUS

## 2015-10-07 MED ORDER — DOCUSATE SODIUM 100 MG PO CAPS
100.0000 mg | ORAL_CAPSULE | Freq: Two times a day (BID) | ORAL | Status: DC
  Administered 2015-10-07 – 2015-10-09 (×5): 100 mg via ORAL

## 2015-10-07 MED ORDER — PHENOL 1.4 % MT LIQD
1.0000 | OROMUCOSAL | Status: DC | PRN
  Filled 2015-10-07: qty 177

## 2015-10-07 MED ORDER — ONDANSETRON HCL 4 MG/2ML IJ SOLN
INTRAMUSCULAR | Status: AC
Start: 2015-10-07 — End: 2015-10-07
  Filled 2015-10-07: qty 2

## 2015-10-07 MED ORDER — ALPRAZOLAM 0.25 MG PO TABS
0.2500 mg | ORAL_TABLET | Freq: Three times a day (TID) | ORAL | Status: DC | PRN
  Administered 2015-10-07: 0.25 mg via ORAL
  Filled 2015-10-07: qty 1

## 2015-10-07 MED ORDER — ACETAMINOPHEN 10 MG/ML IV SOLN
INTRAVENOUS | Status: AC
  Filled 2015-10-07: qty 100

## 2015-10-07 MED ORDER — DEXAMETHASONE SODIUM PHOSPHATE 10 MG/ML IJ SOLN
INTRAMUSCULAR | Status: DC | PRN
  Administered 2015-10-07: 10 mg via INTRAVENOUS

## 2015-10-07 MED ORDER — SODIUM CHLORIDE 0.9 % IJ SOLN
INTRAMUSCULAR | Status: AC
  Filled 2015-10-07: qty 50

## 2015-10-07 MED ORDER — PHENYLEPHRINE HCL 10 MG/ML IJ SOLN
INTRAMUSCULAR | Status: DC | PRN
  Administered 2015-10-07 (×3): 40 ug via INTRAVENOUS

## 2015-10-07 MED ORDER — BUPIVACAINE HCL 0.25 % IJ SOLN
INTRAMUSCULAR | Status: DC | PRN
  Administered 2015-10-07: 20 mL

## 2015-10-07 MED ORDER — BUPIVACAINE HCL (PF) 0.25 % IJ SOLN
INTRAMUSCULAR | Status: AC
  Filled 2015-10-07: qty 30

## 2015-10-07 MED ORDER — DEXAMETHASONE SODIUM PHOSPHATE 10 MG/ML IJ SOLN
INTRAMUSCULAR | Status: AC
  Filled 2015-10-07: qty 1

## 2015-10-07 MED ORDER — FAMOTIDINE 20 MG PO TABS
20.0000 mg | ORAL_TABLET | Freq: Every day | ORAL | Status: DC
  Administered 2015-10-07 – 2015-10-09 (×3): 20 mg via ORAL
  Filled 2015-10-07 (×3): qty 1

## 2015-10-07 MED ORDER — RIVAROXABAN 10 MG PO TABS
10.0000 mg | ORAL_TABLET | Freq: Every day | ORAL | Status: DC
  Administered 2015-10-08 – 2015-10-09 (×2): 10 mg via ORAL
  Filled 2015-10-07 (×3): qty 1

## 2015-10-07 SURGICAL SUPPLY — 51 items
BAG DECANTER FOR FLEXI CONT (MISCELLANEOUS) ×2 IMPLANT
BAG SPEC THK2 15X12 ZIP CLS (MISCELLANEOUS) ×1
BAG ZIPLOCK 12X15 (MISCELLANEOUS) ×2 IMPLANT
BANDAGE ELASTIC 6 VELCRO ST LF (GAUZE/BANDAGES/DRESSINGS) ×2 IMPLANT
BLADE SAG 18X100X1.27 (BLADE) ×2 IMPLANT
BLADE SAW SGTL 11.0X1.19X90.0M (BLADE) ×2 IMPLANT
BOWL SMART MIX CTS (DISPOSABLE) ×2 IMPLANT
CAPT KNEE TOTAL 3 ATTUNE ×1 IMPLANT
CEMENT HV SMART SET (Cement) ×4 IMPLANT
CHLORAPREP W/TINT 26ML (MISCELLANEOUS) ×1 IMPLANT
CLOTH BEACON ORANGE TIMEOUT ST (SAFETY) ×2 IMPLANT
CUFF TOURN SGL QUICK 34 (TOURNIQUET CUFF) ×2
CUFF TRNQT CYL 34X4X40X1 (TOURNIQUET CUFF) ×1 IMPLANT
DECANTER SPIKE VIAL GLASS SM (MISCELLANEOUS) ×2 IMPLANT
DRAPE U-SHAPE 47X51 STRL (DRAPES) ×2 IMPLANT
DRSG ADAPTIC 3X8 NADH LF (GAUZE/BANDAGES/DRESSINGS) ×2 IMPLANT
DRSG PAD ABDOMINAL 8X10 ST (GAUZE/BANDAGES/DRESSINGS) ×2 IMPLANT
DURAPREP 26ML APPLICATOR (WOUND CARE) ×1 IMPLANT
ELECT REM PT RETURN 9FT ADLT (ELECTROSURGICAL) ×2
ELECTRODE REM PT RTRN 9FT ADLT (ELECTROSURGICAL) ×1 IMPLANT
EVACUATOR 1/8 PVC DRAIN (DRAIN) ×2 IMPLANT
GAUZE SPONGE 4X4 12PLY STRL (GAUZE/BANDAGES/DRESSINGS) ×2 IMPLANT
GLOVE BIO SURGEON STRL SZ7.5 (GLOVE) ×1 IMPLANT
GLOVE BIO SURGEON STRL SZ8 (GLOVE) ×2 IMPLANT
GLOVE BIOGEL PI IND STRL 6.5 (GLOVE) IMPLANT
GLOVE BIOGEL PI IND STRL 8 (GLOVE) ×1 IMPLANT
GLOVE BIOGEL PI INDICATOR 6.5 (GLOVE)
GLOVE BIOGEL PI INDICATOR 8 (GLOVE) ×2
GLOVE SURG SS PI 6.5 STRL IVOR (GLOVE) IMPLANT
GOWN STRL REUS W/TWL LRG LVL3 (GOWN DISPOSABLE) ×2 IMPLANT
GOWN STRL REUS W/TWL XL LVL3 (GOWN DISPOSABLE) ×1 IMPLANT
HANDPIECE INTERPULSE COAX TIP (DISPOSABLE) ×2
IMMOBILIZER KNEE 20 (SOFTGOODS) ×2
IMMOBILIZER KNEE 20 THIGH 36 (SOFTGOODS) ×1 IMPLANT
MANIFOLD NEPTUNE II (INSTRUMENTS) ×2 IMPLANT
NS IRRIG 1000ML POUR BTL (IV SOLUTION) ×2 IMPLANT
PACK TOTAL KNEE CUSTOM (KITS) ×2 IMPLANT
PADDING CAST COTTON 6X4 STRL (CAST SUPPLIES) ×5 IMPLANT
POSITIONER SURGICAL ARM (MISCELLANEOUS) ×2 IMPLANT
SET HNDPC FAN SPRY TIP SCT (DISPOSABLE) ×1 IMPLANT
STRIP CLOSURE SKIN 1/2X4 (GAUZE/BANDAGES/DRESSINGS) ×3 IMPLANT
SUT MNCRL AB 4-0 PS2 18 (SUTURE) ×2 IMPLANT
SUT VIC AB 2-0 CT1 27 (SUTURE) ×6
SUT VIC AB 2-0 CT1 TAPERPNT 27 (SUTURE) ×3 IMPLANT
SUT VLOC 180 0 24IN GS25 (SUTURE) ×2 IMPLANT
SYR 50ML LL SCALE MARK (SYRINGE) ×2 IMPLANT
TRAY FOLEY W/METER SILVER 14FR (SET/KITS/TRAYS/PACK) ×1 IMPLANT
TRAY FOLEY W/METER SILVER 16FR (SET/KITS/TRAYS/PACK) ×2 IMPLANT
WATER STERILE IRR 1500ML POUR (IV SOLUTION) ×2 IMPLANT
WRAP KNEE MAXI GEL POST OP (GAUZE/BANDAGES/DRESSINGS) ×2 IMPLANT
YANKAUER SUCT BULB TIP 10FT TU (MISCELLANEOUS) ×2 IMPLANT

## 2015-10-07 NOTE — Anesthesia Postprocedure Evaluation (Signed)
Anesthesia Post Note  Patient: Peter Bachelor, MD  Procedure(s) Performed: Procedure(s) (LRB): RIGHT TOTAL KNEE ARTHROPLASTY, with synovial tissue specimen (Right)  Patient location during evaluation: PACU Anesthesia Type: Spinal Level of consciousness: oriented and awake and alert Pain management: pain level controlled Vital Signs Assessment: post-procedure vital signs reviewed and stable Respiratory status: spontaneous breathing, respiratory function stable and patient connected to nasal cannula oxygen Cardiovascular status: blood pressure returned to baseline and stable Postop Assessment: no headache, no backache and spinal receding Anesthetic complications: no    Last Vitals:  Filed Vitals:   10/07/15 1045 10/07/15 1100  BP: 110/70 128/70  Pulse:  68  Temp: 36.4 C 36.4 C  Resp:  14    Last Pain:  Filed Vitals:   10/07/15 1107  PainSc: 0-No pain                 Effie Berkshire

## 2015-10-07 NOTE — Progress Notes (Signed)
Recent treatment of bronchitis with improvement past medication

## 2015-10-07 NOTE — H&P (View-Only) (Signed)
Peter Bachelor MD DOB: April 18, 1946 Single / Language: Cleophus Molt / Race: White Male Date of Admission:  10/07/2015 CC:  Right Knee Pain History of Present Illness The patient is a 69 year old male who comes in for a preoperative History and Physical. The patient is scheduled for a right total knee arthroplasty to be performed by Dr. Dione Plover. Aluisio, MD at [facility] on 10-07-2015. The patient reports left knee and right knee (worse) symptoms including: pain and swelling . Prior to being seen, the patient was previously evaluated in this clinic. Previous work-up for this problem has included knee x-rays (07/10/15 in [place]) and knee MRI (about 1 year ago following acute pseudo gout attack of the right knee). Past treatment for this problem has included intra-articular injection of corticosteroids (has had several aspirations and cortisone injections, with recent being 12/17/14 with Dr. Amedeo Plenty). Current treatment includes knee brace (very helpful, wears to work, without it has sudden intense sharp pains) and nonsteroidal anti-inflammatory drugs (Celebrex 2x/daily from rheumatologist). 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 He has affected multiple joints. He has had multiple surgeries including left shoulder and lower back operations. He states that the knees are his biggest problem now. The right knee is far worse than the left. Dr. Amedeo Plenty has performed aspiration and cortisone injection on several occasions. Last one was about six months ago. Jerrelle feels like the right knee is limiting what he can and cannot do. He used to have extremely active lifestyle and is no longer doing so because of the knee. Pain occurs throughout the day and sometimes occurs at night. Worsening the pain with a little functional limitations. His radiographs that were taken at [facility] radiology showed that he has bone-on-bone arthritis in the  medial and patellofemoral compartments of that right knee. He also has pretty significant chondrocalcinosis laterally. The left knee show some medial narrowing, but not bone-on-bone. Chondrocalcinosis also present on the left. At this point, the most predictable means of improving his pain and functions could be total knee arthroplasty. We discussed that in detail and he wants to go ahead and proceed. I told him that the success rate is extremely high in CPPD disease, but occasionally I have seen the patient or two that have had some postop synovitis from the crystals. We will do a complete synovectomy at the time of surgery, but still the tissue sometimes will recur and he can get some crystal disease, but it is unlikely. He definitely wants to go ahead and get this fixed. They have been treated conservatively in the past for the above stated problem and despite conservative measures, they continue to have progressive pain and severe functional limitations and dysfunction. They have failed non-operative management including home exercise, medications, and injections. It is felt that they would benefit from undergoing total joint replacement. Risks and benefits of the procedure have been discussed with the patient and they elect to proceed with surgery. There are no active contraindications to surgery such as ongoing infection or rapidly progressive neurological disease.  Problem List/Past Medical Trigger middle finger of right hand (M65.331)  Chronic pain of both knees (M25.561, M25.562)  Primary localized osteoarthritis of left knee (M17.12)  Pain, hand joint, right (M25.541)  Chronic pain syndrome (G89.4)  Pain of right middle finger (M79.644)  Impaired Vision  wears glasses Impaired Hearing  hearing aids Meniere's Disease  Hypertension  Hypercholesterolemia  Rheumatic Fever  Childhood Illness  Heart murmur  Trivial Tricuspid Regurg Hiatal Hernia  Gastroesophageal Reflux Disease   Skin Cancer  Melanoma Degenerative Disc Disease  Pseudogout  CPPD Colon Polyps  Allergic Rhinitis  Coronary Artery Disease  Mild - 2008 Slight plaque in LAD, mixed plaque in CIRC. Venous Insufficiency  Spondylosis  Anxiety Disorder  TMJ Syndrome  Right-sided Ejection Fraction  55% per Echo March 2008 History of PVC's   Allergies  Gentamicin 0.4mg /ml in Saline *AMINOGLYCOSIDES*  "strong allergy" Adhesive 1"x6yd *MEDICAL DEVICES*  Kenalog *CORTICOSTEROIDS*  Bacitracin *ANTI-INFECTIVE AGENTS - MISC.*  Neosporin *OPHTHALMIC AGENTS*  Moxifloxacin HCl *CHEMICALS*  Hydrocodone w/APAP *ANALGESICS - OPIOID*  Rash. Please note that the patient was able to take hydrocodone cough syrup without difficulty since then.  Family History Heart Disease  Brother. Hypertension  Mother. Osteoarthritis  Brother, Father. Alzheimer's disease  Mother, Father. Osteoarthritis  Sibling. Three  Social History  Tobacco use  Never smoker. 09/24/2014 Current drinker  09/24/2014: Currently drinks wine 8-14 times per week Living situation  live with spouse Marital status  married Number of flights of stairs before winded  greater than 5 No history of drug/alcohol rehab  Not under pain contract  Most recent primary occupation  Diagnostic Radiologist Exercise  Exercises rarely; does running / walking and gym / weights Children  2 - Son and Daughter Current work status  working part time [place] following the Right Total Knee Repalcement 10-07-2015 [place] Current occupation  Physician - Diagnostic Radiologist  Medication History  Xanax (0.5MG  Tablet, 1/2 - 1 Oral tid prn) Active. Norvasc (10MG  Tablet, Oral daily) Active. Aspirin (81MG  Tablet, 1 (one) Oral daily) Active. Wellbutrin (100MG  Tablet, 1 (one) Oral two times daily, Taken starting 03/21/2015) Active. CeleBREX (200MG  Capsule, Oral two times  daily) Active. Colchicine (0.6MG  Capsule, Oral two times daily) Active. Restasis (0.05% Emulsion, 1 drop both eyes Ophthalmic two times daily) Active. Hydrochlorothiazide (25MG  Tablet, Oral daily) Active. [facility] Tablet ER, Oral daily) Active. Accupril (10MG  Tablet, Oral daily) Active. Zantac (150MG  Tablet, Oral two times daily) Active. TraMADol HCl (50MG  Tablet, 1-2 Oral daily as needed) Active. (PRN) Crestor (10MG  Tablet, Oral at bedtime) Active. Multi-Day (Oral daily) Active.  Past Surgical History Rotator Cuff Repair  Date: 07/2011. left - Dr. Durward Fortes Neck Disc Surgery  Date: 2003. Dr. Rita Ohara Carpal Tunnel Repair  Date: 2015. bilateral - Dr. Daylene Katayama Spinal Decompression  Date: 09/2013. Two Level Fusion (L3-4, L4-5) - Dr. Rita Ohara Spinal Surgery  Spinal Fusion  neck and lower back Melanoma Resections  2002, 2003  Review of Systems General Not Present- Chills, Fatigue, Fever, Memory Loss, Night Sweats, Weight Gain and Weight Loss. Skin Not Present- Eczema, Hives, Itching, Lesions and Rash. HEENT Present- Hearing Loss (uses bilateral hearing aids). Not Present- Dentures, Double Vision, Headache, Tinnitus and Visual Loss. Respiratory Not Present- Allergies, Chronic Cough, Coughing up blood, Shortness of breath at rest and Shortness of breath with exertion. Cardiovascular Not Present- Chest Pain, Difficulty Breathing Lying Down, Murmur, Palpitations, Racing/skipping heartbeats and Swelling. Gastrointestinal Not Present- Abdominal Pain, Bloody Stool, Constipation, Diarrhea, Difficulty Swallowing, Heartburn, Jaundice, Loss of appetitie, Nausea and Vomiting. Male Genitourinary Not Present- Blood in Urine, Discharge, Flank Pain, Incontinence, Painful Urination, Urgency, Urinary frequency, Urinary Retention, Urinating at Night and Weak urinary stream. Musculoskeletal Present- Back Pain, Joint Pain and Morning Stiffness. Not Present- Joint Swelling, Muscle Pain, Muscle  Weakness and Spasms. Neurological Not Present- Blackout spells, Difficulty with balance, Dizziness, Paralysis, Tremor and Weakness. Psychiatric Not Present-  Insomnia.  Vitals Weight: 170 lb Height: 69in Weight was reported by patient. Height was reported by patient. Body Surface Area: 1.93 m Body Mass Index: 25.1 kg/m  BP: 138/80 (Sitting, Right Arm, Standard)  Physical Exam General Mental Status -Alert, cooperative and good historian. General Appearance-pleasant, Not in acute distress. Orientation-Oriented X3. Build & Nutrition-Well nourished and Well developed.  Head and Neck Head-normocephalic, atraumatic . Neck Global Assessment - supple, no bruit auscultated on the right, no bruit auscultated on the left.  Eye Vision-Wears corrective lenses. Pupil - Bilateral-Regular and Round. Motion - Bilateral-EOMI.  Chest and Lung Exam Auscultation Breath sounds - clear at anterior chest wall and clear at posterior chest wall. Adventitious sounds - No Adventitious sounds.  Cardiovascular Auscultation Rhythm - Regular rate and rhythm. Heart Sounds - S1 WNL and S2 WNL. Murmurs & Other Heart Sounds - Auscultation of the heart reveals - No Murmurs.  Abdomen Palpation/Percussion Tenderness - Abdomen is non-tender to palpation. Rigidity (guarding) - Abdomen is soft. Auscultation Auscultation of the abdomen reveals - Bowel sounds normal.  Male Genitourinary Note: Not done, not pertinent to present illness   Musculoskeletal Note: On exam, he is alert and oriented, in no apparent distress. His hips show normal range of motion with no discomfort. His left knee shows no effusion. Range on the left is 0 to 130. There is some crepitus on range of motion. He does not have any deformity. He has no instability. His right knee shows slight swelling, significant varus. Range of motion about 5 to 125. Marked crepitus on range of motion. Tenderness medial greater than  lateral with no instability noted. Pulse sensation and motor intact in both lower extremities. Gait pattern shows a pretty significant varus thrust on that right knee.  RADIOGRAPHS His radiographs that were taken at [facility] radiology showed that he has bone-on-bone arthritis in the medial and patellofemoral compartments of that right knee. He also has pretty significant chondrocalcinosis laterally. The left knee show some medial narrowing, but not bone-on-bone. Chondrocalcinosis also present on the left.  Assessment & Plan  Primary localized osteoarthritis of right knee (M17.11)  Note:Surgical Plans: Right Total Knee Replacement  Disposition: Home with wife  PCP: Dr. Lenna Gilford - Patient has been seen preoperatively and felt to be stable for surgery.  Topical TXA - History of Mild CAD and Melanoma  Anesthesia Issues: He had horrible chills with the anesthesia associated with his left shoulder surgery in 2010, but did okay with the anesthesia in Dec 2014 for his back surgery. He did have a strain or pain associated with the right TMJ following intubation for the back surgery.  His Rheumatologist ask for pictures of the joint tissue and crystal deposits and also evaluation of the tissue to confirm the diagnosis of CCPD at time of surgery.  Comments: Questionable suture material reaction following Left Rotator Cuff Repair in October 2012.  Operative Report 08/13/2011 - Left Rotator Cuff Repair - "Fibers were debrided back to bleeding tissue and then repaired in several layers with 0 Ethibond and 0 Vicryl.Marland KitchenMarland KitchenMarland KitchenThe deltoid fascia was closed with a running 0 Vicryl subcu with 3-0 Monocryl skin closed with Steri-strips over benzoin..." Operative Report 09/01/2011 - Left Shoulder I&D, Removal of Etibond Sutures, Rerepair of Left Rotaotr Cuff Tear - "Several Ethibond sutures wre identified and removed from the wound.Marland KitchenMarland KitchenThe rotator cuff tear was identified and repaired with interrupted #25-gauge  stainless steeel wire.Marland KitchenMarland KitchenThe deltoid fascia was closed with the same 25-gauge wire. Skin was closed with interrupted 3-0 nylon over  a penrose drain.'  Opertaive Report 10/02/2013 - Lumbar Surgery - Dr. Rita Ohara - "The paraspinal muscles, deep fascia, and Scarpa's fascia were closed with interrupted undyed 1 Vicryl sutures, the subcutaneous and subcuticular closed with interrupted inverted 2-0 undyed Vicryl sutures.Marland KitchenMarland KitchenThe skin edges were approximated with Dermabond. The drain was sutured in place with a 3-0 nylon suture."  Signed electronically by Joelene Millin, III PA-C

## 2015-10-07 NOTE — Op Note (Signed)
Pre-operative diagnosis- Osteoarthritis  Right knee(s)  Post-operative diagnosis- Osteoarthritis Right knee(s)  Procedure-  Right  Total Knee Arthroplasty  Surgeon- Dione Plover. Gayland Nicol, MD  Assistant- Arlee Muslim, PA-C   Anesthesia-  Spinal  EBL-* No blood loss amount entered *   Drains Hemovac  Tourniquet time-  Total Tourniquet Time Documented: Thigh (Right) - 37 minutes Total: Thigh (Right) - 37 minutes     Complications- None  Condition-PACU - hemodynamically stable.   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 He has constant pain, with activity and at rest and significant functional deficits with difficulties even with ADLs. He has had extensive non-op management including analgesics, injections of cortisone and viscosupplements, and home exercise program, but remains in significant pain with significant dysfunction. Radiographs show bone on bone arthritis medial and patellofemoral with varus deformity. He presents now for right Total Knee Arthroplasty.    Procedure in detail---   The patient is brought into the operating room and positioned supine on the operating table. After successful administration of  Spinal,   a tourniquet is placed high on the  Right thigh(s) and the lower extremity is prepped and draped in the usual sterile fashion. Time out is performed by the operating team and then the  Right lower extremity is wrapped in Esmarch, knee flexed and the tourniquet inflated to 300 mmHg.       A midline incision is made with a ten blade through the subcutaneous tissue to the level of the extensor mechanism. A fresh blade is used to make a medial parapatellar arthrotomy. Soft tissue over the proximal medial tibia is subperiosteally elevated to the joint line with a knife and into the semimembranosus bursa with a Cobb elevator. Soft tissue over the proximal lateral  tibia is elevated with attention being paid to avoiding the patellar tendon on the tibial tubercle. The patella is everted, knee flexed 90 degrees and the ACL and PCL are removed. Findings are bone on bone medial and patellofemoral with large global osteophytes. He has a history of CPPD but we did not see any crystals in his synovium. A synovial specimen is sent to pathology.      The drill is used to create a starting hole in the distal femur and the canal is thoroughly irrigated with sterile saline to remove the fatty contents. The 5 degree Right  valgus alignment guide is placed into the femoral canal and the distal femoral cutting block is pinned to remove 9 mm off the distal femur. Resection is made with an oscillating saw.      The tibia is subluxed forward and the menisci are removed. The extramedullary alignment guide is placed referencing proximally at the medial aspect of the tibial tubercle and distally along the second metatarsal axis and tibial crest. The block is pinned to remove 64mm off the more deficient medial  side. Resection is made with an oscillating saw. Size 8is the most appropriate size for the tibia and the proximal tibia is prepared with the modular drill and keel punch for that size.      The femoral sizing guide is placed and size 7 is most appropriate. Rotation is marked off the epicondylar axis and confirmed by creating a rectangular flexion gap at 90 degrees. The size 7 cutting block is pinned in this rotation and the anterior, posterior and chamfer cuts are made  with the oscillating saw. The intercondylar block is then placed and that cut is made.      Trial size 8 tibial component, trial size 7 posterior stabilized femur and a 10  mm posterior stabilized rotating platform insert trial is placed. Full extension is achieved with excellent varus/valgus and anterior/posterior balance throughout full range of motion. The patella is everted and thickness measured to be 27  mm. Free  hand resection is taken to 15 mm, a 41 template is placed, lug holes are drilled, trial patella is placed, and it tracks normally. Osteophytes are removed off the posterior femur with the trial in place. All trials are removed and the cut bone surfaces prepared with pulsatile lavage. Cement is mixed and once ready for implantation, the size 7 tibial implant, size  8 posterior stabilized femoral component, and the size 41 patella are cemented in place and the patella is held with the clamp. The trial insert is placed and the knee held in full extension. The Exparel (20 ml mixed with 30 ml saline) and .25% Bupivicaine, are injected into the extensor mechanism, posterior capsule, medial and lateral gutters and subcutaneous tissues.  All extruded cement is removed and once the cement is hard the permanent 10 mm posterior stabilized rotating platform insert is placed into the tibial tray.      The wound is copiously irrigated with saline solution and the extensor mechanism closed over a hemovac drain with #1 V-loc suture. The tourniquet is released for a total tourniquet time of 37  minutes. Flexion against gravity is 140 degrees and the patella tracks normally. Subcutaneous tissue is closed with 2.0 vicryl and subcuticular with running 4.0 Monocryl. The incision is cleaned and dried and steri-strips and a bulky sterile dressing are applied. The limb is placed into a knee immobilizer and the patient is awakened and transported to recovery in stable condition.      Please note that a surgical assistant was a medical necessity for this procedure in order to perform it in a safe and expeditious manner. Surgical assistant was necessary to retract the ligaments and vital neurovascular structures to prevent injury to them and also necessary for proper positioning of the limb to allow for anatomic placement of the prosthesis.   Dione Plover Maree Ainley, MD    10/07/2015, 9:15 AM

## 2015-10-07 NOTE — Anesthesia Procedure Notes (Signed)
Spinal Patient location during procedure: OR Start time: 10/07/2015 8:15 AM End time: 10/07/2015 8:18 AM Staffing Anesthesiologist: Suella Broad D Performed by: anesthesiologist  Preanesthetic Checklist Completed: patient identified, site marked, surgical consent, pre-op evaluation, timeout performed, IV checked, risks and benefits discussed and monitors and equipment checked Spinal Block Patient position: sitting Prep: ChloraPrep Patient monitoring: heart rate, continuous pulse ox, blood pressure and cardiac monitor Approach: midline Location: L4-5 Injection technique: single-shot Needle Needle type: Introducer and Sprotte  Needle gauge: 24 G Needle length: 9 cm Additional Notes Negative paresthesia. Negative blood return. Positive free-flowing CSF. Expiration date of kit checked and confirmed. Patient tolerated procedure well, without complications.

## 2015-10-07 NOTE — Interval H&P Note (Signed)
History and Physical Interval Note:  10/07/2015 7:06 AM  [name], MD  has presented today for surgery, with the diagnosis of OA RIGHT KNEE  The various methods of treatment have been discussed with the patient and family. After consideration of risks, benefits and other options for treatment, the patient has consented to  Procedure(s): RIGHT TOTAL KNEE ARTHROPLASTY (Right) as a surgical intervention .  The patient's history has been reviewed, patient examined, no change in status, stable for surgery.  I have reviewed the patient's chart and labs.  Questions were answered to the patient's satisfaction.     Gearlean Alf

## 2015-10-07 NOTE — Evaluation (Signed)
Physical Therapy Evaluation Patient Details Name: EULA HOEY, MD MRN: [id number] DOB: July 19, 1946 Today's Date: 10/07/2015   History of Present Illness  RTKA  Clinical Impression  Patient reports minimal pain, ambulated x 50'. Patient  Will benefit from PT to address problems listed in the note below to Dc to home.   Follow Up Recommendations Home health PT;Supervision - Intermittent    Equipment Recommendations  Rolling walker with 5" wheels;3in1 (PT)    Recommendations for Other Services       Precautions / Restrictions Precautions Precautions: Knee;Fall Required Braces or Orthoses: Knee Immobilizer - Right Knee Immobilizer - Right: Discontinue once straight leg raise with < 10 degree lag      Mobility  Bed Mobility Overal bed mobility: Needs Assistance Bed Mobility: Supine to Sit;Sit to Supine     Supine to sit: Min guard Sit to supine: Min guard   General bed mobility comments: manages R leg  Transfers Overall transfer level: Needs assistance Equipment used: Rolling walker (2 wheeled) Transfers: Sit to/from Stand Sit to Stand: Min assist         General transfer comment: cues for hand and R leg position  Ambulation/Gait Ambulation/Gait assistance: Min assist Ambulation Distance (Feet): 50 Feet Assistive device: Rolling walker (2 wheeled) Gait Pattern/deviations: Step-to pattern;Step-through pattern;Antalgic     General Gait Details: cues for sequence  Stairs            Wheelchair Mobility    Modified Rankin (Stroke Patients Only)       Balance                                             Pertinent Vitals/Pain Pain Assessment: 0-10 Pain Score: 1  Pain Location: R knee Pain Descriptors / Indicators: Discomfort Pain Intervention(s): Ice applied;Premedicated before session    Home Living Family/patient expects to be discharged to:: Private residence Living Arrangements: Spouse/significant other Available Help at  Discharge: Family Type of Home: House Home Access: Stairs to enter Entrance Stairs-Rails: Left Entrance Stairs-Number of Steps: 5 Home Layout: One level   Additional Comments: has a hiking pole    Prior Function Level of Independence: Independent               Hand Dominance        Extremity/Trunk Assessment               Lower Extremity Assessment: RLE deficits/detail RLE Deficits / Details: + SLR. knee flexion to 40*    Cervical / Trunk Assessment: Normal  Communication   Communication: No difficulties  Cognition Arousal/Alertness: Awake/alert Behavior During Therapy: WFL for tasks assessed/performed Overall Cognitive Status: Within Functional Limits for tasks assessed                      General Comments      Exercises Total Joint Exercises Quad Sets: AROM;Right;5 reps Heel Slides: AROM;Right;5 reps      Assessment/Plan    PT Assessment Patient needs continued PT services  PT Diagnosis Difficulty walking   PT Problem List Decreased strength;Decreased range of motion;Decreased activity tolerance;Decreased mobility;Decreased knowledge of precautions;Decreased safety awareness;Decreased knowledge of use of DME;Pain  PT Treatment Interventions DME instruction;Gait training;Stair training;Functional mobility training;Therapeutic activities;Therapeutic exercise;Patient/family education   PT Goals (Current goals can be found in the Care Plan section) Acute Rehab PT Goals Patient Stated Goal: to  walk withoutpain PT Goal Formulation: With patient/family Time For Goal Achievement: 10/10/15 Potential to Achieve Goals: Good    Frequency 7X/week   Barriers to discharge        Co-evaluation               End of Session Equipment Utilized During Treatment: Gait belt Activity Tolerance: Patient tolerated treatment well Patient left: in bed;with call bell/phone within reach;with family/visitor present Nurse Communication: Mobility  status         Time: [id number] PT Time Calculation (min) (ACUTE ONLY): 23 min   Charges:   PT Evaluation $Initial PT Evaluation Tier I: 1 Procedure PT Treatments $Gait Training: 8-22 mins   PT G Codes:        Claretha Cooper 10/07/2015, 5:23 PM Tresa Endo PT [phone]

## 2015-10-07 NOTE — Transfer of Care (Signed)
Immediate Anesthesia Transfer of Care Note  Patient: Peter Bachelor, MD  Procedure(s) Performed: Procedure(s): RIGHT TOTAL KNEE ARTHROPLASTY, with synovial tissue specimen (Right)  Patient Location: PACU  Anesthesia Type:Spinal  Level of Consciousness:  Alert, patient cooperative and responds to stimulation  Airway & Oxygen Therapy:Patient Spontanous Breathing and Patient connected to face mask oxgen  Post-op Assessment:  Report given to PACU RN and Post -op Vital signs reviewed and stable  Post vital signs:  Reviewed and stable,spinal T12  Last Vitals:  Filed Vitals:   10/07/15 0619  BP: 123/81  Pulse: 85  Temp: 36.4 C  Resp: 18    Complications: No apparent anesthesia complications

## 2015-10-08 LAB — BASIC METABOLIC PANEL
Anion gap: 6 (ref 5–15)
BUN: 14 mg/dL (ref 6–20)
CO2: 28 mmol/L (ref 22–32)
Calcium: 8.9 mg/dL (ref 8.9–10.3)
Chloride: 102 mmol/L (ref 101–111)
Creatinine, Ser: 0.88 mg/dL (ref 0.61–1.24)
GFR calc Af Amer: >60 mL/min (ref >60)
GFR calc non Af Amer: >60 mL/min (ref >60)
Glucose, Bld: 143 mg/dL — ABNORMAL HIGH (ref 65–99)
Potassium: 4.4 mmol/L (ref 3.5–5.1)
Sodium: 136 mmol/L (ref 135–145)

## 2015-10-08 LAB — CBC
HCT: 37.5 % — ABNORMAL LOW (ref 39.0–52.0)
Hemoglobin: 12.7 g/dL — ABNORMAL LOW (ref 13.0–17.0)
MCH: 31.1 pg (ref 26.0–34.0)
MCHC: 33.9 g/dL (ref 30.0–36.0)
MCV: 91.9 fL (ref 78.0–100.0)
Platelets: 257 10*3/uL (ref 150–400)
RBC: 4.08 MIL/uL — ABNORMAL LOW (ref 4.22–5.81)
RDW: 12 % (ref 11.5–15.5)
WBC: 23.1 10*3/uL — ABNORMAL HIGH (ref 4.0–10.5)

## 2015-10-08 MED ORDER — METHOCARBAMOL 500 MG PO TABS: 500.0000 mg | ORAL_TABLET | Freq: Four times a day (QID) | ORAL | Status: DC | PRN

## 2015-10-08 MED ORDER — OXYCODONE HCL 5 MG PO TABS: 5.0000 mg | ORAL_TABLET | ORAL | Status: DC | PRN

## 2015-10-08 MED ORDER — TRAMADOL HCL 50 MG PO TABS: 50.0000 mg | ORAL_TABLET | Freq: Four times a day (QID) | ORAL | Status: DC | PRN

## 2015-10-08 MED ORDER — RIVAROXABAN 10 MG PO TABS: 10.0000 mg | ORAL_TABLET | Freq: Every day | ORAL | Status: DC

## 2015-10-08 NOTE — Evaluation (Signed)
Occupational Therapy Evaluation Patient Details Name: Peter BRODIE, MD MRN: [id number] DOB: 1945-11-16 Today's Date: 10/08/2015    History of Present Illness RTKA   Clinical Impression   Patient admitted with above. Patient independent PTA. Patient currently functioning at an overall supervision to min assist level (min assist needed for LB ADLs).  No additional OT needs identified, D/C from acute OT services and no additional follow-up OT needs at this time. All appropriate education provided to patient. Please re-order OT if needed.      Follow Up Recommendations  No OT follow up;Supervision - Intermittent    Equipment Recommendations  3 in 1 bedside comode    Recommendations for Other Services  None at this time    Precautions / Restrictions Precautions Precautions: Knee;Fall Required Braces or Orthoses: Knee Immobilizer - Right Knee Immobilizer - Right: Discontinue once straight leg raise with < 10 degree lag Restrictions Weight Bearing Restrictions: Yes RLE Weight Bearing: Weight bearing as tolerated    Mobility Bed Mobility General bed mobility comments: Pt found seated in recliner upon OT entering/exiting room  Transfers Overall transfer level: Needs assistance Equipment used: Rolling walker (2 wheeled) Transfers: Sit to/from Stand Sit to Stand: Supervision General transfer comment: cues for hand and R leg position    Balance Overall balance assessment: Needs assistance Sitting-balance support: No upper extremity supported;Feet supported Sitting balance-Leahy Scale: Good     Standing balance support: Bilateral upper extremity supported;During functional activity Standing balance-Leahy Scale: Good    ADL Overall ADL's : Needs assistance/impaired General ADL Comments: Pt requires assistance with LB ADLs. Educated pt on use of reacher, sock aid, LH sponge, and LH shoe horn. Pt will benefit from [facility] over toilet seat in order to increase independence and safety  with toilet transfers.     Pertinent Vitals/Pain Pain Assessment: Faces Faces Pain Scale: Hurts a little bit Pain Location: right knee Pain Descriptors / Indicators: Sore;Discomfort Pain Intervention(s): Monitored during session;Repositioned;Ice applied     Hand Dominance Right   Extremity/Trunk Assessment Upper Extremity Assessment Upper Extremity Assessment: Overall WFL for tasks assessed   Lower Extremity Assessment Lower Extremity Assessment: Defer to PT evaluation   Cervical / Trunk Assessment Cervical / Trunk Assessment: Normal   Communication Communication Communication: No difficulties   Cognition Arousal/Alertness: Awake/alert Behavior During Therapy: WFL for tasks assessed/performed Overall Cognitive Status: Within Functional Limits for tasks assessed              Home Living Family/patient expects to be discharged to:: Private residence Living Arrangements: Spouse/significant other Available Help at Discharge: Family Type of Home: House Home Access: Stairs to enter Technical brewer of Steps: 5 Entrance Stairs-Rails: Left Home Layout: One level     Bathroom Shower/Tub: Walk-in shower;Door   [employer] Toilet: Standard         Additional Comments: has a hiking pole      Prior Functioning/Environment Level of Independence: Independent     OT Diagnosis: Generalized weakness;Acute pain   OT Problem List:  n/a, no acute OT needs identified    OT Treatment/Interventions:  n/a, no acute OT needs identified     OT Goals(Current goals can be found in the care plan section) Acute Rehab OT Goals Patient Stated Goal: to walk withoutpain OT Goal Formulation: All assessment and education complete, DC therapy  OT Frequency:  n/a, no acute OT needs identified    Barriers to D/C:  none known at this time    End of Session Equipment Utilized During Treatment:  Rolling walker;Right knee immobilizer CPM Right Knee CPM Right Knee: Off  Activity  Tolerance: Patient tolerated treatment well Patient left: in chair;with call bell/phone within reach   Time: 1132-1149 OT Time Calculation (min): 17 min Charges:  OT General Charges $OT Visit: 1 Procedure OT Evaluation $Initial OT Evaluation Tier I: 1 Procedure   Chrys Racer , MS, OTR/L, CLT Pager: [phone]  10/08/2015, 12:45 PM

## 2015-10-08 NOTE — Progress Notes (Signed)
Subjective: 1 Day Post-Op Procedure(s) (LRB): RIGHT TOTAL KNEE ARTHROPLASTY, with synovial tissue specimen (Right) Patient reports pain as mild.   Patient seen in rounds with Dr. Wynelle Link. Patient is well, and has had no acute complaints or problems We will resume therapy today.  Walked 50 feet DOS. \Plan is to go Home after hospital stay.  Objective: Vital signs in last 24 hours: Temp:  [97.3 F (36.3 C)-98.3 F (36.8 C)] 97.9 F (36.6 C) (12/13 0500) Pulse Rate:  [53-79] 72 (12/13 0500) Resp:  [14-20] 16 (12/13 0500) BP: (107-150)/(68-97) 129/69 mmHg (12/13 0500) SpO2:  [93 %-100 %] 97 % (12/13 0500)  Intake/Output from previous day:  Intake/Output Summary (Last 24 hours) at 10/08/15 0854 Last data filed at 10/08/15 [id number]  Gross per 24 hour  Intake 3618.64 ml  Output   2180 ml  Net 1438.64 ml    Intake/Output this shift: Total I/O In: 240 [P.O.:240] Out: -   Labs:  Recent Labs  10/08/15 0548  HGB 12.7*    Recent Labs  10/08/15 0548  WBC 23.1*  RBC 4.08*  HCT 37.5*  PLT 257    Recent Labs  10/08/15 0548  NA 136  K 4.4  CL 102  CO2 28  BUN 14  CREATININE 0.88  GLUCOSE 143*  CALCIUM 8.9   No results for input(s): LABPT, INR in the last 72 hours.  EXAM General - Patient is Alert, Appropriate and Oriented Extremity - Neurovascular intact Sensation intact distally Dorsiflexion/Plantar flexion intact Dressing - dressing C/D/I Motor Function - intact, moving foot and toes well on exam.  Hemovac pulled without difficulty.  Past Medical History  Diagnosis Date  . Allergic rhinitis   . Tinnitus     chronic  . Hypertension   . History of rheumatic fever   . Hypercholesteremia     borderline  . GERD (gastroesophageal reflux disease)   . History of colonic polyps   . History of pseudogout   . Family history of Alzheimer's disease   . History of melanoma   . Chest pain     Nuclear 2005, normal / coronary CTA 2008, slight mixed plaque,  coronary calcium score 0.65, ejection fraction 55%, echo, March, 2008  . Ejection fraction     EF 55%, echo, March, 2008.  . Dizziness     Dizziness with question of presyncope April 08, 2011  . Anxiety   . H/O hiatal hernia   . Heart murmur     69 yrs old rheumatic fever   . Heart murmur 2008    trivial tricusp regurg  . Hearing impaired     bilateral hearing aids  . DJD (degenerative joint disease)     history DDD, fingers, knees, shoulders  . Spondylosis   . Lumbar spondylosis with myelopathy 06/09/2013  . Cancer [facility])     Melanoma -peri umbilical '02 or '03- no further problems. 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   . TMJ click     right"was aggravated with last intubation"  . Complication of anesthesia     itching-not sure if oxycodone or anesthesia,and shakes. Right TMJ issue  . History of recent steroid use     10-10-15 tapering steroid use for tx. recent bronchitis.    Assessment/Plan: 1 Day Post-Op Procedure(s) (LRB): RIGHT TOTAL KNEE ARTHROPLASTY, with synovial tissue specimen (Right) Principal Problem:   OA (osteoarthritis) of knee  Estimated body mass index is 25.54 kg/(m^2) as calculated from the following:   Height as  of this encounter: 5\' 9"  (1.753 m).   Weight as of this encounter: 78.472 kg (173 lb). Advance diet Up with therapy Plan for discharge tomorrow Discharge home with home health  DVT Prophylaxis - Xarelto Weight-Bearing as tolerated to right leg D/C O2 and Pulse OX and try on Room Air  Arlee Muslim, PA-C Orthopaedic Surgery 10/08/2015, 8:54 AM

## 2015-10-08 NOTE — Care Management Note (Signed)
Case Management Note  Patient Details  Name: NEEL DEARBORN, MD MRN: [id number] Date of Birth: 1945-12-29  Subjective/Objective:  S/p Right total knee replacement                  Action/Plan: Discharge planning, spoke with patient and spouse at bedside. Have chosen [facility] for [facility] PT. Contacted [facility] for referral. Needs RW & 3-n-1, contacted [facility] to deliver to room.  Expected Discharge Date:  10/10/15               Expected Discharge Plan:  [place]  In-House Referral:  NA  Discharge planning Services  CM Consult  Post Acute Care Choice:  Durable Medical Equipment, Home Health Choice offered to:  Patient  DME Arranged:  3-N-1, Walker rolling DME Agency:  [place]:  PT [place]:  [place]  Status of Service:  Completed, signed off  Medicare Important Message Given:    Date Medicare IM Given:    Medicare IM give by:    Date Additional Medicare IM Given:    Additional Medicare Important Message give by:     If discussed at [place] of Stay Meetings, dates discussed:    Additional Comments:  Guadalupe Maple, RN 10/08/2015, 1:55 PM

## 2015-10-08 NOTE — Progress Notes (Signed)
Physical Therapy Treatment Patient Details Name: Peter SAUCERMAN, MD MRN: [id number] DOB: 1945/12/19 Today's Date: 10/08/2015    History of Present Illness RTKA    PT Comments    POD # 1 am session Applied KI and instructed on use for amb and stairs.  Assisted OOB to amb a greater distance in hallway.  Returned to room and performed all supine TKR TE's following HEP handout.  Instructed on proper tech and freq as well as use of ICE.  Spouse had many questions throughout the session.  Addressed all.  Left in recliner and applied ICE.  Follow Up Recommendations  Home health PT;Supervision - Intermittent     Equipment Recommendations       Recommendations for Other Services       Precautions / Restrictions Precautions Precautions: Knee;Fall Precaution Comments: instructed on KI use for amb and stairs Required Braces or Orthoses: Knee Immobilizer - Right Knee Immobilizer - Right: Discontinue once straight leg raise with < 10 degree lag Restrictions Weight Bearing Restrictions: No RLE Weight Bearing: Weight bearing as tolerated    Mobility  Bed Mobility Overal bed mobility: Needs Assistance Bed Mobility: Supine to Sit     Supine to sit: Min guard     General bed mobility comments: assist with R LE and increased time  Transfers Overall transfer level: Needs assistance Equipment used: Rolling walker (2 wheeled) Transfers: Sit to/from Stand Sit to Stand: Supervision         General transfer comment: <25% VC's on proper tech and hand placement plus safety with turns  Ambulation/Gait Ambulation/Gait assistance: Min guard;Supervision Ambulation Distance (Feet): 68 Feet Assistive device: Rolling walker (2 wheeled) Gait Pattern/deviations: Step-to pattern;Step-through pattern;Decreased stance time - right Gait velocity: decreased   General Gait Details: 25% VC's on proper walker to self distance, upright posture and safety with turns   Chief Strategy Officer    Modified Rankin (Stroke Patients Only)       Balance Overall balance assessment: Needs assistance Sitting-balance support: No upper extremity supported;Feet supported Sitting balance-Leahy Scale: Good     Standing balance support: Bilateral upper extremity supported;During functional activity Standing balance-Leahy Scale: Good                      Cognition Arousal/Alertness: Awake/alert Behavior During Therapy: WFL for tasks assessed/performed Overall Cognitive Status: Within Functional Limits for tasks assessed                      Exercises   Total Knee Replacement TE's 10 reps B LE ankle pumps 10 reps towel squeezes 10 reps knee presses 10 reps heel slides  10 reps SAQ's 10 reps SLR's 10 reps ABD Followed by ICE     General Comments        Pertinent Vitals/Pain Pain Assessment: 0-10 Pain Score: 7  Faces Pain Scale: Hurts a little bit Pain Location: R knee Pain Descriptors / Indicators: Constant;Discomfort;Grimacing;Sore;Tender Pain Intervention(s): Monitored during session;Premedicated before session;Repositioned;Ice applied    Home Living Family/patient expects to be discharged to:: Private residence Living Arrangements: Spouse/significant other Available Help at Discharge: Family Type of Home: House Home Access: Stairs to enter Entrance Stairs-Rails: Left Home Layout: One level   Additional Comments: has a hiking pole    Prior Function Level of Independence: Independent          PT Goals (current goals can now be found in  the care plan section) Acute Rehab PT Goals Patient Stated Goal: to walk withoutpain Progress towards PT goals: Progressing toward goals    Frequency  7X/week    PT Plan Current plan remains appropriate    Co-evaluation             End of Session Equipment Utilized During Treatment: Gait belt Activity Tolerance: Patient tolerated treatment well Patient left: in  chair;with call bell/phone within reach;with family/visitor present     Time: 0950-1030 PT Time Calculation (min) (ACUTE ONLY): 40 min  Charges:  $Gait Training: 8-22 mins $Therapeutic Exercise: 8-22 mins $Therapeutic Activity: 8-22 mins                    G Codes:      Rica Koyanagi  PTA [facility]  Acute  Rehab Pager      [phone]

## 2015-10-08 NOTE — Progress Notes (Signed)
Utilization review completed.  

## 2015-10-08 NOTE — Progress Notes (Signed)
Physical Therapy Treatment Patient Details Name: Peter QUIST, MD MRN: [id number] DOB: 03-Apr-1946 Today's Date: 10/08/2015    History of Present Illness RTKA    PT Comments    POD # 1 pm session.  Assisted with amb a greater distance in hallway with increased stride length.  Assisted back to bed for CPM.    Follow Up Recommendations  Home health PT;Supervision - Intermittent     Equipment Recommendations       Recommendations for Other Services       Precautions / Restrictions Precautions Precautions: Knee;Fall Precaution Comments: instructed on KI use for amb and stairs Required Braces or Orthoses: Knee Immobilizer - Right Knee Immobilizer - Right: Discontinue once straight leg raise with < 10 degree lag Restrictions Weight Bearing Restrictions: No RLE Weight Bearing: Weight bearing as tolerated    Mobility  Bed Mobility Overal bed mobility: Needs Assistance Bed Mobility: Supine to Sit     Supine to sit: Min guard     General bed mobility comments: assisted back to bed for CPM  Transfers Overall transfer level: Needs assistance Equipment used: Rolling walker (2 wheeled) Transfers: Sit to/from Stand Sit to Stand: Supervision         General transfer comment: <25% VC's on proper tech and hand placement plus safety with turns  Ambulation/Gait Ambulation/Gait assistance: Min guard;Supervision Ambulation Distance (Feet): 115 Feet Assistive device: Rolling walker (2 wheeled) Gait Pattern/deviations: Step-to pattern;Step-through pattern;Decreased stance time - right Gait velocity: decreased   General Gait Details: 25% VC's on proper walker to self distance, upright posture and safety with turns   Science writer    Modified Rankin (Stroke Patients Only)       Balance Overall balance assessment: Needs assistance Sitting-balance support: No upper extremity supported;Feet supported Sitting balance-Leahy Scale: Good      Standing balance support: Bilateral upper extremity supported;During functional activity Standing balance-Leahy Scale: Good                      Cognition Arousal/Alertness: Awake/alert Behavior During Therapy: WFL for tasks assessed/performed Overall Cognitive Status: Within Functional Limits for tasks assessed                      Exercises      General Comments        Pertinent Vitals/Pain Pain Assessment: 0-10 Pain Score: 7  Faces Pain Scale: Hurts a little bit Pain Location: R knee Pain Descriptors / Indicators: Constant;Discomfort;Grimacing;Sore;Tender Pain Intervention(s): Monitored during session;Premedicated before session;Repositioned;Ice applied    Home Living Family/patient expects to be discharged to:: Private residence Living Arrangements: Spouse/significant other Available Help at Discharge: Family Type of Home: House Home Access: Stairs to enter Entrance Stairs-Rails: Left Home Layout: One level   Additional Comments: has a hiking pole    Prior Function Level of Independence: Independent          PT Goals (current goals can now be found in the care plan section) Acute Rehab PT Goals Patient Stated Goal: to walk withoutpain Progress towards PT goals: Progressing toward goals    Frequency  7X/week    PT Plan Current plan remains appropriate    Co-evaluation             End of Session Equipment Utilized During Treatment: Gait belt Activity Tolerance: Patient tolerated treatment well Patient left: in chair;with call bell/phone within reach;with family/visitor present  Time: 1335-1400 PT Time Calculation (min) (ACUTE ONLY): 25 min  Charges:  $Gait Training: 8-22 mins $Therapeutic Activity: 8-22 mins                    G Codes:      Peter [name]  PTA [facility]  Acute  Rehab Pager      [phone]

## 2015-10-08 NOTE — Discharge Instructions (Addendum)
° °Dr. Frank Aluisio °Total Joint Specialist °[facility] °[address] °[place], [place] [address] °[phone] ° °TOTAL KNEE REPLACEMENT POSTOPERATIVE DIRECTIONS ° °Knee Rehabilitation, Guidelines Following Surgery  °Results after knee surgery are often greatly improved when you follow the exercise, range of motion and muscle strengthening exercises prescribed by your doctor. Safety measures are also important to protect the knee from further injury. Any time any of these exercises cause you to have increased pain or swelling in your knee joint, decrease the amount until you are comfortable again and slowly increase them. If you have problems or questions, call your caregiver or physical therapist for advice.  ° °HOME CARE INSTRUCTIONS  °Remove items at home which could result in a fall. This includes throw rugs or furniture in walking pathways.  °· ICE to the affected knee every three hours for 30 minutes at a time and then as needed for pain and swelling.  Continue to use ice on the knee for pain and swelling from surgery. You may notice swelling that will progress down to the foot and ankle.  This is normal after surgery.  Elevate the leg when you are not up walking on it.   °· Continue to use the breathing machine which will help keep your temperature down.  It is common for your temperature to cycle up and down following surgery, especially at night when you are not up moving around and exerting yourself.  The breathing machine keeps your lungs expanded and your temperature down. °· Do not place pillow under knee, focus on keeping the knee straight while resting ° °DIET °You may resume your previous home diet once your are discharged from the hospital. ° °DRESSING / WOUND CARE / SHOWERING °You may shower 3 days after surgery, but keep the wounds dry during showering.  You may use an occlusive plastic wrap (Press'n Seal for example), NO SOAKING/SUBMERGING IN THE BATHTUB.  If the  bandage gets wet, change with a clean dry gauze.  If the incision gets wet, pat the wound dry with a clean towel. °You may start showering once you are discharged home but do not submerge the incision under water. Just pat the incision dry and apply a dry gauze dressing on daily. °Change the surgical dressing daily and reapply a dry dressing each time. ° °ACTIVITY °Walk with your walker as instructed. °Use walker as long as suggested by your caregivers. °Avoid periods of inactivity such as sitting longer than an hour when not asleep. This helps prevent blood clots.  °You may resume a sexual relationship in one month or when given the OK by your doctor.  °You may return to work once you are cleared by your doctor.  °Do not drive a car for 6 weeks or until released by you surgeon.  °Do not drive while taking narcotics. ° °WEIGHT BEARING °Weight bearing as tolerated with assist device (walker, cane, etc) as directed, use it as long as suggested by your surgeon or therapist, typically at least 4-6 weeks. ° °POSTOPERATIVE CONSTIPATION PROTOCOL °Constipation - defined medically as fewer than three stools per week and severe constipation as less than one stool per week. ° °One of the most common issues patients have following surgery is constipation.  Even if you have a regular bowel pattern at home, your normal regimen is likely to be disrupted due to multiple reasons following surgery.  Combination of anesthesia, postoperative narcotics, change in appetite and fluid intake all can affect your bowels.    In order to avoid complications following surgery, here are some recommendations in order to help you during your recovery period. ° °Colace (docusate) - Pick up an over-the-counter form of Colace or another stool softener and take twice a day as long as you are requiring postoperative pain medications.  Take with a full glass of water daily.  If you experience loose stools or diarrhea, hold the colace until you stool forms  back up.  If your symptoms do not get better within 1 week or if they get worse, check with your doctor. ° °Dulcolax (bisacodyl) - Pick up over-the-counter and take as directed by the product packaging as needed to assist with the movement of your bowels.  Take with a full glass of water.  Use this product as needed if not relieved by Colace only.  ° °MiraLax (polyethylene glycol) - Pick up over-the-counter to have on hand.  MiraLax is a solution that will increase the amount of water in your bowels to assist with bowel movements.  Take as directed and can mix with a glass of water, juice, soda, coffee, or tea.  Take if you go more than two days without a movement. °Do not use MiraLax more than once per day. Call your doctor if you are still constipated or irregular after using this medication for 7 days in a row. ° °If you continue to have problems with postoperative constipation, please contact the office for further assistance and recommendations.  If you experience "the worst abdominal pain ever" or develop nausea or vomiting, please contact the office immediatly for further recommendations for treatment. ° °ITCHING ° If you experience itching with your medications, try taking only a single pain pill, or even half a pain pill at a time.  You can also use Benadryl over the counter for itching or also to help with sleep.  ° °TED HOSE STOCKINGS °Wear the elastic stockings on both legs for three weeks following surgery during the day but you may remove then at night for sleeping. ° °MEDICATIONS °See your medication summary on the “After Visit Summary” that the nursing staff will review with you prior to discharge.  You may have some home medications which will be placed on hold until you complete the course of blood thinner medication.  It is important for you to complete the blood thinner medication as prescribed by your surgeon.  Continue your approved medications as instructed at time of  discharge. ° °PRECAUTIONS °If you experience chest pain or shortness of breath - call 911 immediately for transfer to the hospital emergency department.  °If you develop a fever greater that 101 F, purulent drainage from wound, increased redness or drainage from wound, foul odor from the wound/dressing, or calf pain - CONTACT YOUR SURGEON.   °                                                °FOLLOW-UP APPOINTMENTS °Make sure you keep all of your appointments after your operation with your surgeon and caregivers. You should call the office at the above phone number and make an appointment for approximately two weeks after the date of your surgery or on the date instructed by your surgeon outlined in the "After Visit Summary". ° ° °RANGE OF MOTION AND STRENGTHENING EXERCISES  °Rehabilitation of the knee is important following a knee injury or   an operation. After just a few days of immobilization, the muscles of the thigh which control the knee become weakened and shrink (atrophy). Knee exercises are designed to build up the tone and strength of the thigh muscles and to improve knee motion. Often times heat used for twenty to thirty minutes before working out will loosen up your tissues and help with improving the range of motion but do not use heat for the first two weeks following surgery. These exercises can be done on a training (exercise) mat, on the floor, on a table or on a bed. Use what ever works the best and is most comfortable for you Knee exercises include:  °Leg Lifts - While your knee is still immobilized in a splint or cast, you can do straight leg raises. Lift the leg to 60 degrees, hold for 3 sec, and slowly lower the leg. Repeat 10-20 times 2-3 times daily. Perform this exercise against resistance later as your knee gets better.  °Quad and Hamstring Sets - Tighten up the muscle on the front of the thigh (Quad) and hold for 5-10 sec. Repeat this 10-20 times hourly. Hamstring sets are done by pushing the  foot backward against an object and holding for 5-10 sec. Repeat as with quad sets.  °· Leg Slides: Lying on your back, slowly slide your foot toward your buttocks, bending your knee up off the floor (only go as far as is comfortable). Then slowly slide your foot back down until your leg is flat on the floor again. °· Angel Wings: Lying on your back spread your legs to the side as far apart as you can without causing discomfort.  °A rehabilitation program following serious knee injuries can speed recovery and prevent re-injury in the future due to weakened muscles. Contact your doctor or a physical therapist for more information on knee rehabilitation.  ° °IF YOU ARE TRANSFERRED TO A SKILLED REHAB FACILITY °If the patient is transferred to a skilled rehab facility following release from the hospital, a list of the current medications will be sent to the facility for the patient to continue.  When discharged from the skilled rehab facility, please have the facility set up the patient's Home Health Physical Therapy prior to being released. Also, the skilled facility will be responsible for providing the patient with their medications at time of release from the facility to include their pain medication, the muscle relaxants, and their blood thinner medication. If the patient is still at the rehab facility at time of the two week follow up appointment, the skilled rehab facility will also need to assist the patient in arranging follow up appointment in our office and any transportation needs. ° °MAKE SURE YOU:  °Understand these instructions.  °Get help right away if you are not doing well or get worse.  ° ° °Pick up stool softner and laxative for home use following surgery while on pain medications. °Do not submerge incision under water. °Please use good hand washing techniques while changing dressing each day. °May shower starting three days after surgery. °Please use a clean towel to pat the incision dry following  showers. °Continue to use ice for pain and swelling after surgery. °Do not use any lotions or creams on the incision until instructed by your surgeon. ° °Take Xarelto for two and a half more weeks, then discontinue Xarelto. °Once the patient has completed the Xarelto, they may resume the 81 mg Aspirin. ° ° °Information on my medicine - XARELTO® (Rivaroxaban) ° °  This medication education was reviewed with me or my healthcare representative as part of my discharge preparation.  The pharmacist that spoke with me during my hospital stay was:  Christine ° °Why was Xarelto® prescribed for you? °Xarelto® was prescribed for you to reduce the risk of blood clots forming after orthopedic surgery. The medical term for these abnormal blood clots is venous thromboembolism (VTE). ° °What do you need to know about xarelto® ? °Take your Xarelto® ONCE DAILY at the same time every day. °You may take it either with or without food. ° °If you have difficulty swallowing the tablet whole, you may crush it and mix in applesauce just prior to taking your dose. ° °Take Xarelto® exactly as prescribed by your doctor and DO NOT stop taking Xarelto® without talking to the doctor who prescribed the medication.  Stopping without other VTE prevention medication to take the place of Xarelto® may increase your risk of developing a clot. ° °After discharge, you should have regular check-up appointments with your healthcare provider that is prescribing your Xarelto®.   ° °What do you do if you miss a dose? °If you miss a dose, take it as soon as you remember on the same day then continue your regularly scheduled once daily regimen the next day. Do not take two doses of Xarelto® on the same day.  ° °Important Safety Information °A possible side effect of Xarelto® is bleeding. You should call your healthcare provider right away if you experience any of the following: °? Bleeding from an injury or your nose that does not stop. °? Unusual colored urine  (red or dark brown) or unusual colored stools (red or black). °? Unusual bruising for unknown reasons. °? A serious fall or if you hit your head (even if there is no bleeding). ° °Some medicines may interact with Xarelto® and might increase your risk of bleeding while on Xarelto®. To help avoid this, consult your healthcare provider or pharmacist prior to using any new prescription or non-prescription medications, including herbals, vitamins, non-steroidal anti-inflammatory drugs (NSAIDs) and supplements. ° °This website has more information on Xarelto®: [email or web address]. ° ° ° °

## 2015-10-08 NOTE — Discharge Summary (Signed)
Physician Discharge Summary   Patient ID: Peter HOPFENSPERGER, Peter [name] MRN: [id number] DOB/AGE: 04/06/1946 69 y.o.  Admit date: 10/07/2015 Discharge date: 10/09/2015  Primary Diagnosis:  Osteoarthritis Right knee(s) Admission Diagnoses:  Past Medical History  Diagnosis Date  . Allergic rhinitis   . Tinnitus     chronic  . Hypertension   . History of rheumatic fever   . Hypercholesteremia     borderline  . GERD (gastroesophageal reflux disease)   . History of colonic polyps   . History of pseudogout   . Family history of Alzheimer's disease   . History of melanoma   . Chest pain     Nuclear 2005, normal / coronary CTA 2008, slight mixed plaque, coronary calcium score 0.65, ejection fraction 55%, echo, March, 2008  . Ejection fraction     EF 55%, echo, March, 2008.  . Dizziness     Dizziness with question of presyncope April 08, 2011  . Anxiety   . H/O hiatal hernia   . Heart murmur     69 yrs old rheumatic fever   . Heart murmur 2008    trivial tricusp regurg  . Hearing impaired     bilateral hearing aids  . DJD (degenerative joint disease)     history DDD, fingers, knees, shoulders  . Spondylosis   . Lumbar spondylosis with myelopathy 06/09/2013  . Cancer [facility])     Melanoma -peri umbilical '02 or '03- no further problems. 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   . TMJ click     right"was aggravated with last intubation"  . Complication of anesthesia     itching-not sure if oxycodone or anesthesia,and shakes. Right TMJ issue  . History of recent steroid use     10-10-15 tapering steroid use for tx. recent bronchitis.   Discharge Diagnoses:   Principal Problem:   OA (osteoarthritis) of knee  Estimated body mass index is 25.54 kg/(m^2) as calculated from the following:   Height as of this encounter: '5\' 9"'  (1.753 m).   Weight as of this encounter: 78.472 kg (173 lb).  Procedure:  Procedure(s) (LRB): RIGHT TOTAL KNEE ARTHROPLASTY, with synovial tissue specimen (Right)    Consults: None  HPI: Peter Bachelor, Peter [name] is a 69 y.o. year old male with end stage OA of his right knee with progressively worsening pain and dysfunction. He has constant pain, with activity and at rest and significant functional deficits with difficulties even with ADLs. He has had extensive non-op management including analgesics, injections of cortisone and viscosupplements, and home exercise program, but remains in significant pain with significant dysfunction. Radiographs show bone on bone arthritis medial and patellofemoral with varus deformity. He presents now for right Total Knee Arthroplasty. Laboratory Data: Admission on 10/07/2015  Component Date Value Ref Range Status  . WBC 10/08/2015 23.1* 4.0 - 10.5 K/uL Final  . RBC 10/08/2015 4.08* 4.22 - 5.81 MIL/uL Final  . Hemoglobin 10/08/2015 12.7* 13.0 - 17.0 g/dL Final  . HCT 10/08/2015 37.5* 39.0 - 52.0 % Final  . MCV 10/08/2015 91.9  78.0 - 100.0 fL Final  . MCH 10/08/2015 31.1  26.0 - 34.0 pg Final  . MCHC 10/08/2015 33.9  30.0 - 36.0 g/dL Final  . RDW 10/08/2015 12.0  11.5 - 15.5 % Final  . Platelets 10/08/2015 257  150 - 400 K/uL Final  . Sodium 10/08/2015 136  135 - 145 mmol/L Final  . Potassium 10/08/2015 4.4  3.5 - 5.1 mmol/L Final  . Chloride  10/08/2015 102  101 - 111 mmol/L Final  . CO2 10/08/2015 28  22 - 32 mmol/L Final  . Glucose, Bld 10/08/2015 143* 65 - 99 mg/dL Final  . BUN 10/08/2015 14  6 - 20 mg/dL Final  . Creatinine, Ser 10/08/2015 0.88  0.61 - 1.24 mg/dL Final  . Calcium 10/08/2015 8.9  8.9 - 10.3 mg/dL Final  . GFR calc non Af Amer 10/08/2015 >60  >60 mL/min Final  . GFR calc Af Amer 10/08/2015 >60  >60 mL/min Final   Comment: (NOTE) The eGFR has been calculated using the CKD EPI equation. This calculation has not been validated in all clinical situations. eGFR's persistently <60 mL/min signify possible Chronic Kidney Disease.   . Anion gap 10/08/2015 6  5 - 15 Final  . WBC 10/09/2015 15.0* 4.0 - 10.5  K/uL Final  . RBC 10/09/2015 3.73* 4.22 - 5.81 MIL/uL Final  . Hemoglobin 10/09/2015 11.7* 13.0 - 17.0 g/dL Final  . HCT 10/09/2015 34.7* 39.0 - 52.0 % Final  . MCV 10/09/2015 93.0  78.0 - 100.0 fL Final  . MCH 10/09/2015 31.4  26.0 - 34.0 pg Final  . MCHC 10/09/2015 33.7  30.0 - 36.0 g/dL Final  . RDW 10/09/2015 12.3  11.5 - 15.5 % Final  . Platelets 10/09/2015 227  150 - 400 K/uL Final  . Sodium 10/09/2015 137  135 - 145 mmol/L Final  . Potassium 10/09/2015 3.8  3.5 - 5.1 mmol/L Final  . Chloride 10/09/2015 104  101 - 111 mmol/L Final  . CO2 10/09/2015 27  22 - 32 mmol/L Final  . Glucose, Bld 10/09/2015 110* 65 - 99 mg/dL Final  . BUN 10/09/2015 10  6 - 20 mg/dL Final  . Creatinine, Ser 10/09/2015 0.74  0.61 - 1.24 mg/dL Final  . Calcium 10/09/2015 8.5* 8.9 - 10.3 mg/dL Final  . GFR calc non Af Amer 10/09/2015 >60  >60 mL/min Final  . GFR calc Af Amer 10/09/2015 >60  >60 mL/min Final   Comment: (NOTE) The eGFR has been calculated using the CKD EPI equation. This calculation has not been validated in all clinical situations. eGFR's persistently <60 mL/min signify possible Chronic Kidney Disease.   Georgiann Hahn gap 10/09/2015 6  5 - 15 Final  Hospital Outpatient Visit on 09/30/2015  Component Date Value Ref Range Status  . MRSA, PCR 09/30/2015 NEGATIVE  NEGATIVE Final  . Staphylococcus aureus 09/30/2015 NEGATIVE  NEGATIVE Final   Comment:        The Xpert SA Assay (FDA approved for NASAL specimens in patients over 17 years of age), is one component of a comprehensive surveillance program.  Test performance has been validated by [facility] for patients greater than or equal to 9 year old. It is not intended to diagnose infection nor to guide or monitor treatment.   Marland Kitchen aPTT 09/30/2015 27  24 - 37 seconds Final  . WBC 09/30/2015 11.4* 4.0 - 10.5 K/uL Final  . RBC 09/30/2015 5.15  4.22 - 5.81 MIL/uL Final  . Hemoglobin 09/30/2015 16.4  13.0 - 17.0 g/dL Final  . HCT 09/30/2015  46.8  39.0 - 52.0 % Final  . MCV 09/30/2015 90.9  78.0 - 100.0 fL Final  . MCH 09/30/2015 31.8  26.0 - 34.0 pg Final  . MCHC 09/30/2015 35.0  30.0 - 36.0 g/dL Final  . RDW 09/30/2015 12.0  11.5 - 15.5 % Final  . Platelets 09/30/2015 309  150 - 400 K/uL Final  . Sodium 09/30/2015  139  135 - 145 mmol/L Final  . Potassium 09/30/2015 4.4  3.5 - 5.1 mmol/L Final  . Chloride 09/30/2015 102  101 - 111 mmol/L Final  . CO2 09/30/2015 29  22 - 32 mmol/L Final  . Glucose, Bld 09/30/2015 97  65 - 99 mg/dL Final  . BUN 09/30/2015 27* 6 - 20 mg/dL Final  . Creatinine, Ser 09/30/2015 1.04  0.61 - 1.24 mg/dL Final  . Calcium 09/30/2015 9.8  8.9 - 10.3 mg/dL Final  . Total Protein 09/30/2015 7.6  6.5 - 8.1 g/dL Final  . Albumin 09/30/2015 4.7  3.5 - 5.0 g/dL Final  . AST 09/30/2015 27  15 - 41 U/L Final  . ALT 09/30/2015 31  17 - 63 U/L Final  . Alkaline Phosphatase 09/30/2015 85  38 - 126 U/L Final  . Total Bilirubin 09/30/2015 0.9  0.3 - 1.2 mg/dL Final  . GFR calc non Af Amer 09/30/2015 >60  >60 mL/min Final  . GFR calc Af Amer 09/30/2015 >60  >60 mL/min Final   Comment: (NOTE) The eGFR has been calculated using the CKD EPI equation. This calculation has not been validated in all clinical situations. eGFR's persistently <60 mL/min signify possible Chronic Kidney Disease.   . Anion gap 09/30/2015 8  5 - 15 Final  . Prothrombin Time 09/30/2015 13.8  11.6 - 15.2 seconds Final  . INR 09/30/2015 1.04  0.00 - 1.49 Final  . ABO/RH(D) 09/30/2015 O POS   Final  . Antibody Screen 09/30/2015 NEG   Final  . Sample Expiration 09/30/2015 10/10/2015   Final  . Extend sample reason 09/30/2015 NO TRANSFUSIONS OR PREGNANCY IN THE PAST 3 MONTHS   Final  . Color, Urine 09/30/2015 YELLOW  YELLOW Final  . APPearance 09/30/2015 CLEAR  CLEAR Final  . Specific Gravity, Urine 09/30/2015 1.021  1.005 - 1.030 Final  . pH 09/30/2015 6.5  5.0 - 8.0 Final  . Glucose, UA 09/30/2015 NEGATIVE  NEGATIVE mg/dL Final  . Hgb  urine dipstick 09/30/2015 NEGATIVE  NEGATIVE Final  . Bilirubin Urine 09/30/2015 NEGATIVE  NEGATIVE Final  . Ketones, ur 09/30/2015 NEGATIVE  NEGATIVE mg/dL Final  . Protein, ur 09/30/2015 NEGATIVE  NEGATIVE mg/dL Final  . Nitrite 09/30/2015 NEGATIVE  NEGATIVE Final  . Leukocytes, UA 09/30/2015 NEGATIVE  NEGATIVE Final   MICROSCOPIC NOT DONE ON URINES WITH NEGATIVE PROTEIN, BLOOD, LEUKOCYTES, NITRITE, OR GLUCOSE <1000 mg/dL.  . ABO/RH(D) 09/30/2015 O POS   Final     X-Rays:No results found.  EKG: Orders placed or performed in visit on 06/10/15  . EKG 12-Lead     Hospital Course: Peter BOSE, Peter [name] is a 69 y.o. who was admitted to [facility]. They were brought to the operating room on 10/07/2015 and underwent Procedure(s): RIGHT TOTAL KNEE ARTHROPLASTY, with synovial tissue specimen.  Patient tolerated the procedure well and was later transferred to the recovery room and then to the orthopaedic floor for postoperative care.  They were given PO and IV analgesics for pain control following their surgery.  They were given 24 hours of postoperative antibiotics of  Anti-infectives    Start     Dose/Rate Route Frequency Ordered Stop   10/07/15 1400  ceFAZolin (ANCEF) IVPB 2 g/50 mL premix     2 g 100 mL/hr over 30 Minutes Intravenous Every 6 hours 10/07/15 1118 10/07/15 2224   10/07/15 0741  ceFAZolin (ANCEF) IVPB 2 g/50 mL premix     2 g 100 mL/hr over 30  Minutes Intravenous On call to O.R. 10/07/15 1308 10/07/15 0825     and started on DVT prophylaxis in the form of Xarelto.   PT and OT were ordered for total joint protocol.  Discharge planning consulted to help with postop disposition and equipment needs.  Patient had a good night on the evening of surgery.  They started to get up OOB with therapy on day one. Hemovac drain was pulled without difficulty.  Continued to work with therapy into day two.  Dressing was changed on day two and the incision was healing well.  Patient was seen  in rounds by Dr. Wynelle Link and was ready to go home.  Diet: Cardiac diet Activity:WBAT Follow-up:in 2 weeks Disposition - Home Discharged Condition: good      Discharge Instructions    Call Peter [name] / Call 911    Complete by:  As directed   If you experience chest pain or shortness of breath, CALL 911 and be transported to the hospital emergency room.  If you develope a fever above 101 F, pus (white drainage) or increased drainage or redness at the wound, or calf pain, call your surgeon's office.     Change dressing    Complete by:  As directed   Change dressing daily with sterile 4 x 4 inch gauze dressing and apply TED hose. Do not submerge the incision under water.     Constipation Prevention    Complete by:  As directed   Drink plenty of fluids.  Prune juice may be helpful.  You may use a stool softener, such as Colace (over the counter) 100 mg twice a day.  Use MiraLax (over the counter) for constipation as needed.     Diet - low sodium heart healthy    Complete by:  As directed      Discharge instructions    Complete by:  As directed   Pick up stool softner and laxative for home use following surgery while on pain medications. Do not submerge incision under water. Please use good hand washing techniques while changing dressing each day. May shower starting three days after surgery. Please use a clean towel to pat the incision dry following showers. Continue to use ice for pain and swelling after surgery. Do not use any lotions or creams on the incision until instructed by your surgeon.  Take Xarelto for two and a half more weeks, then discontinue Xarelto. Once the patient has completed the Xarelto, they may resume the 81 mg Aspirin.  Postoperative Constipation Protocol  Constipation - defined medically as fewer than three stools per week and severe constipation as less than one stool per week.  One of the most common issues patients have following surgery is constipation.  Even if  you have a regular bowel pattern at home, your normal regimen is likely to be disrupted due to multiple reasons following surgery.  Combination of anesthesia, postoperative narcotics, change in appetite and fluid intake all can affect your bowels.  In order to avoid complications following surgery, here are some recommendations in order to help you during your recovery period.  Colace (docusate) - Pick up an over-the-counter form of Colace or another stool softener and take twice a day as long as you are requiring postoperative pain medications.  Take with a full glass of water daily.  If you experience loose stools or diarrhea, hold the colace until you stool forms back up.  If your symptoms do not get better within 1 week or if  they get worse, check with your doctor.  Dulcolax (bisacodyl) - Pick up over-the-counter and take as directed by the product packaging as needed to assist with the movement of your bowels.  Take with a full glass of water.  Use this product as needed if not relieved by Colace only.   MiraLax (polyethylene glycol) - Pick up over-the-counter to have on hand.  MiraLax is a solution that will increase the amount of water in your bowels to assist with bowel movements.  Take as directed and can mix with a glass of water, juice, soda, coffee, or tea.  Take if you go more than two days without a movement. Do not use MiraLax more than once per day. Call your doctor if you are still constipated or irregular after using this medication for 7 days in a row.  If you continue to have problems with postoperative constipation, please contact the office for further assistance and recommendations.  If you experience "the worst abdominal pain ever" or develop nausea or vomiting, please contact the office immediatly for further recommendations for treatment.     Do not put a pillow under the knee. Place it under the heel.    Complete by:  As directed      Do not sit on low chairs, stoools or  toilet seats, as it may be difficult to get up from low surfaces    Complete by:  As directed      Driving restrictions    Complete by:  As directed   No driving until released by the physician.     Increase activity slowly as tolerated    Complete by:  As directed      Lifting restrictions    Complete by:  As directed   No lifting until released by the physician.     Patient may shower    Complete by:  As directed   You may shower without a dressing once there is no drainage.  Do not wash over the wound.  If drainage remains, do not shower until drainage stops.     TED hose    Complete by:  As directed   Use stockings (TED hose) for 3 weeks on both leg(s).  You may remove them at night for sleeping.     Weight bearing as tolerated    Complete by:  As directed   Laterality:  right  Extremity:  Lower            Medication List    STOP taking these medications        aspirin 81 MG tablet     celecoxib 200 MG capsule  Commonly known as:  CELEBREX     multivitamin with minerals Tabs tablet      TAKE these medications        ALPRAZolam 0.5 MG tablet  Commonly known as:  XANAX  Take 1/2 to 1 tablet by mouth three times daily as needed for anxiety     amLODipine 10 MG tablet  Commonly known as:  NORVASC  Take 1 tablet (10 mg total) by mouth daily.     buPROPion 75 MG tablet  Commonly known as:  WELLBUTRIN  Take 75 mg by mouth daily.     colchicine 0.6 MG tablet  Take 1 tablet (0.6 mg total) by mouth 2 (two) times daily.     hydrochlorothiazide 25 MG tablet  Commonly known as:  HYDRODIURIL  TAKE 1 TABLET by mouth  daily  HYDROmorphone 2 MG tablet  Commonly known as:  DILAUDID  Take 1-2 tablets (2-4 mg total) by mouth every 4 (four) hours as needed for moderate pain or severe pain.     methocarbamol 500 MG tablet  Commonly known as:  ROBAXIN  Take 1 tablet (500 mg total) by mouth every 6 (six) hours as needed for muscle spasms.     montelukast 10 MG tablet   Commonly known as:  SINGULAIR  Take 1 tablet (10 mg total) by mouth at bedtime.     potassium chloride SA 20 MEQ tablet  Commonly known as:  K-DUR,KLOR-CON  Take 1 tablet (20 mEq total) by mouth daily.     quinapril 10 MG tablet  Commonly known as:  ACCUPRIL  Take 1 tablet (10 mg total) by mouth daily.     ranitidine 150 MG capsule  Commonly known as:  ZANTAC  Take 150 mg by mouth daily.     RESTASIS 0.05 % ophthalmic emulsion  Generic drug:  cycloSPORINE  Place 1 drop into both eyes 2 (two) times daily.     rivaroxaban 10 MG Tabs tablet  Commonly known as:  XARELTO  Take 1 tablet (10 mg total) by mouth daily with breakfast. Take Xarelto for two and a half more weeks, then discontinue Xarelto. Once the patient has completed the Xarelto, they may resume the 81 mg Aspirin.     rosuvastatin 10 MG tablet  Commonly known as:  CRESTOR  TAKE 1 TABLET BY MOUTH AT  BEDTIME     traMADol 50 MG tablet  Commonly known as:  ULTRAM  Take 1-2 tablets (50-100 mg total) by mouth every 6 (six) hours as needed (mild pain).       Follow-up Information    Follow up with [facility].   Why:  physical therapy   Contact information:   [place] [address] [phone]       Follow up with [facility].   Why:  rolling walker and 3-n-1   Contact information:   [address] [place] [address] [phone]       Follow up with Gearlean Alf, Peter [name]. Schedule an appointment as soon as possible for a visit in 2 weeks.   Specialty:  Orthopedic Surgery   Why:  Call office at [phone] to setup appointment in two weeks.   Contact information:   [address] [place] [address] [phone]       Signed: Arlee Muslim, PA-C Orthopaedic Surgery 10/09/2015, 12:26 PM

## 2015-10-09 ENCOUNTER — Other Ambulatory Visit: Payer: Self-pay | Admitting: Pulmonary Disease

## 2015-10-09 LAB — CBC
HCT: 34.7 % — ABNORMAL LOW (ref 39.0–52.0)
Hemoglobin: 11.7 g/dL — ABNORMAL LOW (ref 13.0–17.0)
MCH: 31.4 pg (ref 26.0–34.0)
MCHC: 33.7 g/dL (ref 30.0–36.0)
MCV: 93 fL (ref 78.0–100.0)
Platelets: 227 10*3/uL (ref 150–400)
RBC: 3.73 MIL/uL — ABNORMAL LOW (ref 4.22–5.81)
RDW: 12.3 % (ref 11.5–15.5)
WBC: 15 10*3/uL — ABNORMAL HIGH (ref 4.0–10.5)

## 2015-10-09 LAB — BASIC METABOLIC PANEL
Anion gap: 6 (ref 5–15)
BUN: 10 mg/dL (ref 6–20)
CO2: 27 mmol/L (ref 22–32)
Calcium: 8.5 mg/dL — ABNORMAL LOW (ref 8.9–10.3)
Chloride: 104 mmol/L (ref 101–111)
Creatinine, Ser: 0.74 mg/dL (ref 0.61–1.24)
GFR calc Af Amer: >60 mL/min (ref >60)
GFR calc non Af Amer: >60 mL/min (ref >60)
Glucose, Bld: 110 mg/dL — ABNORMAL HIGH (ref 65–99)
Potassium: 3.8 mmol/L (ref 3.5–5.1)
Sodium: 137 mmol/L (ref 135–145)

## 2015-10-09 MED ORDER — RIVAROXABAN 10 MG PO TABS: 10.0000 mg | ORAL_TABLET | Freq: Every day | ORAL | Status: DC

## 2015-10-09 MED ORDER — HYDROMORPHONE HCL 2 MG PO TABS: 2.0000 mg | ORAL_TABLET | ORAL | Status: DC | PRN

## 2015-10-09 MED ORDER — METHOCARBAMOL 500 MG PO TABS: 500.0000 mg | ORAL_TABLET | Freq: Four times a day (QID) | ORAL | Status: DC | PRN

## 2015-10-09 MED ORDER — TRAMADOL HCL 50 MG PO TABS: 50.0000 mg | ORAL_TABLET | Freq: Four times a day (QID) | ORAL | Status: DC | PRN

## 2015-10-09 MED ORDER — HYDROMORPHONE HCL 2 MG PO TABS
2.0000 mg | ORAL_TABLET | ORAL | Status: DC | PRN
  Administered 2015-10-09 (×2): 2 mg via ORAL
  Filled 2015-10-09 (×2): qty 1

## 2015-10-09 MED ORDER — DIPHENHYDRAMINE HCL 25 MG PO CAPS
25.0000 mg | ORAL_CAPSULE | Freq: Four times a day (QID) | ORAL | Status: DC | PRN
  Administered 2015-10-09: 25 mg via ORAL
  Filled 2015-10-09: qty 1

## 2015-10-09 NOTE — Progress Notes (Signed)
Subjective: 2 Days Post-Op Procedure(s) (LRB): RIGHT TOTAL KNEE ARTHROPLASTY, with synovial tissue specimen (Right) Patient reports pain as moderate.   Patient seen in rounds by Dr. Wynelle Link.  Rough night with pain.  Switched over to Dilaudid this morning. Will set up discharge today Patient is well, but has had some minor complaints of pain in the knee, requiring pain medications Patient is ready to go home alter today if does well with the med change and therapy.  May need two sessions to meet goals.  Objective: Vital signs in last 24 hours: Temp:  [97.9 F (36.6 C)-99 F (37.2 C)] 99 F (37.2 C) (12/14 0539) Pulse Rate:  [73-91] 91 (12/14 0539) Resp:  [16-18] 16 (12/14 0539) BP: (134-150)/(66-74) 146/73 mmHg (12/14 0539) SpO2:  [83 %-98 %] 97 % (12/14 0539)  Intake/Output from previous day:  Intake/Output Summary (Last 24 hours) at 10/09/15 0729 Last data filed at 10/08/15 2346  Gross per 24 hour  Intake    960 ml  Output   1410 ml  Net   -450 ml   Labs:  Recent Labs  10/08/15 0548 10/09/15 0439  HGB 12.7* 11.7*    Recent Labs  10/08/15 0548 10/09/15 0439  WBC 23.1* 15.0*  RBC 4.08* 3.73*  HCT 37.5* 34.7*  PLT 257 227    Recent Labs  10/08/15 0548 10/09/15 0439  NA 136 137  K 4.4 3.8  CL 102 104  CO2 28 27  BUN 14 10  CREATININE 0.88 0.74  GLUCOSE 143* 110*  CALCIUM 8.9 8.5*   No results for input(s): LABPT, INR in the last 72 hours.  EXAM: General - Patient is Alert, Appropriate and Oriented Extremity - Neurovascular intact Sensation intact distally Dorsiflexion/Plantar flexion intact Incision - clean, dry, no drainage Motor Function - intact, moving foot and toes well on exam.   Assessment/Plan: 2 Days Post-Op Procedure(s) (LRB): RIGHT TOTAL KNEE ARTHROPLASTY, with synovial tissue specimen (Right) Procedure(s) (LRB): RIGHT TOTAL KNEE ARTHROPLASTY, with synovial tissue specimen (Right) Past Medical History  Diagnosis Date  .  Allergic rhinitis   . Tinnitus     chronic  . Hypertension   . History of rheumatic fever   . Hypercholesteremia     borderline  . GERD (gastroesophageal reflux disease)   . History of colonic polyps   . History of pseudogout   . Family history of Alzheimer's disease   . History of melanoma   . Chest pain     Nuclear 2005, normal / coronary CTA 2008, slight mixed plaque, coronary calcium score 0.65, ejection fraction 55%, echo, March, 2008  . Ejection fraction     EF 55%, echo, March, 2008.  . Dizziness     Dizziness with question of presyncope April 08, 2011  . Anxiety   . H/O hiatal hernia   . Heart murmur     69 yrs old rheumatic fever   . Heart murmur 2008    trivial tricusp regurg  . Hearing impaired     bilateral hearing aids  . DJD (degenerative joint disease)     history DDD, fingers, knees, shoulders  . Spondylosis   . Lumbar spondylosis with myelopathy 06/09/2013  . Cancer [facility])     Melanoma -peri umbilical '02 or '03- no further problems. 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   . TMJ click     right"was aggravated with last intubation"  . Complication of anesthesia     itching-not sure if oxycodone or anesthesia,and shakes.  Right TMJ issue  . History of recent steroid use     10-10-15 tapering steroid use for tx. recent bronchitis.   Principal Problem:   OA (osteoarthritis) of knee  Estimated body mass index is 25.54 kg/(m^2) as calculated from the following:   Height as of this encounter: 5\' 9"  (1.753 m).   Weight as of this encounter: 78.472 kg (173 lb). Up with therapy Discharge home with home health Diet - Cardiac diet Follow up - in 2 weeks Activity - WBAT Disposition - Home Condition Upon Discharge - improving D/C Meds - See DC Summary DVT Prophylaxis - Xarelto  Arlee Muslim, PA-C Orthopaedic Surgery 10/09/2015, 7:29 AM

## 2015-10-09 NOTE — Progress Notes (Signed)
Discharge instructions given to patient and family D Kamoni Depree Rn 

## 2015-10-09 NOTE — Progress Notes (Signed)
Physical Therapy Treatment Patient Details Name: REBECCA DAGLEY, MD MRN: [id number] DOB: 1946/07/02 Today's Date: 10/09/2015    History of Present Illness RTKA    PT Comments    POD # 2 am session.  Assisted with amb a greater distance in hallway and practicing stairs with spouse.    Follow Up Recommendations  Home health PT;Supervision - Intermittent     Equipment Recommendations  Rolling walker with 5" wheels;3in1 (PT)    Recommendations for Other Services       Precautions / Restrictions Precautions Precautions: Knee;Fall Precaution Comments: instructed on KI use for amb and stairs Required Braces or Orthoses: Knee Immobilizer - Right Knee Immobilizer - Right: Discontinue once straight leg raise with < 10 degree lag Restrictions Weight Bearing Restrictions: No RLE Weight Bearing: Weight bearing as tolerated    Mobility  Bed Mobility               General bed mobility comments: OOB in recliner  Transfers Overall transfer level: Needs assistance Equipment used: Rolling walker (2 wheeled) Transfers: Sit to/from Stand Sit to Stand: Supervision         General transfer comment: increased time.  Good safety awareness  Ambulation/Gait Ambulation/Gait assistance: Supervision Ambulation Distance (Feet): 125 Feet Assistive device: Rolling walker (2 wheeled) Gait Pattern/deviations: Step-to pattern;Step-through pattern Gait velocity: decreased   General Gait Details: one VC safety with backward gait   Stairs Stairs: Yes Stairs assistance: Min guard Stair Management: One rail Left;Step to pattern;Forwards;With crutches Number of Stairs: 4 General stair comments: 25% VC's on proper sequencing and safety.  Spouse present for hands on demonstration.  Handout also given.  Wheelchair Mobility    Modified Rankin (Stroke Patients Only)       Balance                                    Cognition                             Exercises      General Comments        Pertinent Vitals/Pain Pain Assessment: 0-10 Pain Score: 7  Pain Location: R knee Pain Descriptors / Indicators: Constant;Burning;Grimacing;Sore;Tender Pain Intervention(s): Monitored during session;Premedicated before session;Repositioned;Ice applied    Home Living                      Prior Function            PT Goals (current goals can now be found in the care plan section) Progress towards PT goals: Progressing toward goals    Frequency  7X/week    PT Plan Current plan remains appropriate    Co-evaluation             End of Session Equipment Utilized During Treatment: Gait belt Activity Tolerance: Patient tolerated treatment well Patient left: in chair;with call bell/phone within reach;with family/visitor present     Time: [id number] PT Time Calculation (min) (ACUTE ONLY): 25 min  Charges:  $Gait Training: 8-22 mins $Therapeutic Activity: 8-22 mins                    G Codes:      Rica Koyanagi  PTA [facility]  Acute  Rehab Pager      [phone]

## 2015-10-09 NOTE — Progress Notes (Signed)
Physical Therapy Treatment Patient Details Name: LAROD BARGMAN, MD MRN: [id number] DOB: [birth year] Today's Date: 10/09/2015    History of Present Illness RTKA    PT Comments    POD # 2 pm session.  performed all supine TKR TE's following handout while instructing pt and spouse on proper tech.  Educated/reviewed KI use and proper application.  Instructed on use of ICE.  Instructed on proper positioning R LE while at rest with importance of extension.  Addressed all questions.  Pt ready for D/C to home.    Follow Up Recommendations  Home health PT;Supervision - Intermittent     Equipment Recommendations  Rolling walker with 5" wheels;3in1 (PT)    Recommendations for Other Services       Precautions / Restrictions Precautions Precautions: Knee;Fall Precaution Comments: instructed on KI use for amb and stairs Required Braces or Orthoses: Knee Immobilizer - Right Knee Immobilizer - Right: Discontinue once straight leg raise with < 10 degree lag Restrictions Weight Bearing Restrictions: No RLE Weight Bearing: Weight bearing as tolerated    Mobility  Bed Mobility               General bed mobility comments: OOB in recliner  Transfers        Ambulation/Gait   Stairs Wheelchair Mobility    Modified Rankin (Stroke Patients Only)       Balance                                    Cognition                            Exercises      General Comments        Pertinent Vitals/Pain Pain Assessment: 0-10 Pain Score: 7  Pain Location: R knee Pain Descriptors / Indicators: Constant;Burning;Grimacing;Sore;Tender Pain Intervention(s): Monitored during session;Premedicated before session;Repositioned;Ice applied    Home Living                      Prior Function            PT Goals (current goals can now be found in the care plan section) Progress towards PT goals: Progressing toward goals    Frequency  7X/week     PT Plan Current plan remains appropriate    Co-evaluation             End of Session Equipment Utilized During Treatment: Gait belt Activity Tolerance: Patient tolerated treatment well Patient left: in chair;with call bell/phone within reach;with family/visitor present     Time: 1025-1050 PT Time Calculation (min) (ACUTE ONLY): 25 min  Charges:   $Therapeutic Exercise: 8-22 mins $Self Care/Home Management: 8-22                    G Codes:      Rica Koyanagi  PTA [facility]  Acute  Rehab Pager      [phone]

## 2015-10-22 ENCOUNTER — Telehealth: Payer: Self-pay | Admitting: Pulmonary Disease

## 2015-10-22 MED ORDER — AMLODIPINE BESYLATE 10 MG PO TABS: 10.0000 mg | ORAL_TABLET | Freq: Every day | ORAL | Status: DC

## 2015-10-22 NOTE — Telephone Encounter (Signed)
Called spoke with pt. Aware RX has been sent in.

## 2015-11-04 ENCOUNTER — Other Ambulatory Visit: Payer: Self-pay | Admitting: Pulmonary Disease

## 2015-11-24 ENCOUNTER — Other Ambulatory Visit: Payer: Self-pay | Admitting: Pulmonary Disease

## 2016-01-16 ENCOUNTER — Ambulatory Visit
Admission: RE | Admit: 2016-01-16 | Discharge: 2016-01-16 | Disposition: A | Discharge status: Home / Self Care | Payer: PRIVATE HEALTH INSURANCE | Source: Ambulatory Visit | Attending: Orthopedic Surgery | Admitting: Orthopedic Surgery

## 2016-01-16 ENCOUNTER — Other Ambulatory Visit: Payer: Self-pay | Admitting: Orthopedic Surgery

## 2016-01-16 DIAGNOSIS — Z96651 Presence of right artificial knee joint: Secondary | ICD-10-CM

## 2016-02-24 ENCOUNTER — Other Ambulatory Visit: Payer: Self-pay | Admitting: Pulmonary Disease

## 2016-03-01 ENCOUNTER — Other Ambulatory Visit: Payer: Self-pay | Admitting: Pulmonary Disease

## 2016-04-06 ENCOUNTER — Ambulatory Visit
Admission: RE | Admit: 2016-04-06 | Discharge: 2016-04-06 | Disposition: A | Discharge status: Home / Self Care | Payer: PRIVATE HEALTH INSURANCE | Source: Ambulatory Visit | Attending: Neurosurgery | Admitting: Neurosurgery

## 2016-04-06 ENCOUNTER — Other Ambulatory Visit: Payer: Self-pay | Admitting: Neurosurgery

## 2016-04-06 ENCOUNTER — Other Ambulatory Visit: Payer: Self-pay | Admitting: Pulmonary Disease

## 2016-04-06 DIAGNOSIS — M48061 Spinal stenosis, lumbar region without neurogenic claudication: Secondary | ICD-10-CM

## 2016-04-06 MED ORDER — GADOBENATE DIMEGLUMINE 529 MG/ML IV SOLN
16.0000 mL | Freq: Once | INTRAVENOUS | Status: AC | PRN
  Administered 2016-04-06: 16 mL via INTRAVENOUS

## 2016-04-17 ENCOUNTER — Other Ambulatory Visit: Payer: Self-pay | Admitting: Neurosurgery

## 2016-04-17 DIAGNOSIS — M48061 Spinal stenosis, lumbar region without neurogenic claudication: Secondary | ICD-10-CM

## 2016-04-22 ENCOUNTER — Telehealth: Payer: Self-pay | Admitting: Pulmonary Disease

## 2016-04-22 NOTE — Telephone Encounter (Signed)
Received letter from pt stating:  My back pain has gotten worse and I had a recent CT and MRI lumbar spine, and just met with ___.  I thought you might need copies of my reports so they were included.  He wanted me to try epidural steroid injections and hopefully buy a few years hopefully ___ surgery.  Thanks for your help, advice and understanding.  Letter and copies of CT and MRI placed on SN's cart with copy of this message. 

## 2016-04-22 NOTE — Telephone Encounter (Signed)
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  SMN

## 2016-05-07 NOTE — Discharge Instructions (Signed)

## 2016-05-08 ENCOUNTER — Ambulatory Visit
Admission: RE | Admit: 2016-05-08 | Discharge: 2016-05-08 | Disposition: A | Discharge status: Home / Self Care | Payer: PRIVATE HEALTH INSURANCE | Source: Ambulatory Visit | Attending: Neurosurgery | Admitting: Neurosurgery

## 2016-05-08 DIAGNOSIS — Z981 Arthrodesis status: Secondary | ICD-10-CM | POA: Insufficient documentation

## 2016-05-08 DIAGNOSIS — M51369 Other intervertebral disc degeneration, lumbar region without mention of lumbar back pain or lower extremity pain: Secondary | ICD-10-CM | POA: Insufficient documentation

## 2016-05-08 DIAGNOSIS — R200 Anesthesia of skin: Secondary | ICD-10-CM | POA: Insufficient documentation

## 2016-05-08 DIAGNOSIS — M48061 Spinal stenosis, lumbar region without neurogenic claudication: Secondary | ICD-10-CM | POA: Insufficient documentation

## 2016-05-08 DIAGNOSIS — G629 Polyneuropathy, unspecified: Secondary | ICD-10-CM | POA: Insufficient documentation

## 2016-05-08 DIAGNOSIS — M5136 Other intervertebral disc degeneration, lumbar region: Secondary | ICD-10-CM | POA: Insufficient documentation

## 2016-05-08 MED ORDER — METHYLPREDNISOLONE ACETATE 40 MG/ML INJ SUSP (RADIOLOG
120.0000 mg | Freq: Once | INTRAMUSCULAR | Status: AC
Start: 2016-05-08 — End: 2016-05-08
  Administered 2016-05-08: 120 mg via EPIDURAL

## 2016-05-08 MED ORDER — IOPAMIDOL (ISOVUE-M 200) INJECTION 41%
1.0000 mL | Freq: Once | INTRAMUSCULAR | Status: AC
  Administered 2016-05-08: 1 mL via EPIDURAL

## 2016-05-21 ENCOUNTER — Other Ambulatory Visit: Payer: Self-pay | Admitting: Pulmonary Disease

## 2016-05-22 NOTE — Telephone Encounter (Signed)
Okayed per SN

## 2016-05-24 ENCOUNTER — Other Ambulatory Visit: Payer: Self-pay | Admitting: Pulmonary Disease

## 2016-05-26 ENCOUNTER — Other Ambulatory Visit: Payer: Self-pay | Admitting: Pulmonary Disease

## 2016-05-26 MED ORDER — ROSUVASTATIN CALCIUM 10 MG PO TABS: 10.0000 mg | ORAL_TABLET | Freq: Every day | ORAL | 3 refills | Status: DC

## 2016-06-16 ENCOUNTER — Other Ambulatory Visit: Payer: Self-pay | Admitting: Pulmonary Disease

## 2016-06-25 ENCOUNTER — Other Ambulatory Visit: Payer: Self-pay | Admitting: Pulmonary Disease

## 2016-07-10 ENCOUNTER — Other Ambulatory Visit: Payer: Self-pay | Admitting: Neurosurgery

## 2016-07-10 DIAGNOSIS — M48061 Spinal stenosis, lumbar region without neurogenic claudication: Secondary | ICD-10-CM

## 2016-07-29 ENCOUNTER — Ambulatory Visit
Admission: RE | Admit: 2016-07-29 | Discharge: 2016-07-29 | Disposition: A | Discharge status: Home / Self Care | Payer: PRIVATE HEALTH INSURANCE | Source: Ambulatory Visit | Attending: Neurosurgery | Admitting: Neurosurgery

## 2016-07-29 DIAGNOSIS — M48061 Spinal stenosis, lumbar region without neurogenic claudication: Secondary | ICD-10-CM

## 2016-07-29 MED ORDER — METHYLPREDNISOLONE ACETATE 40 MG/ML INJ SUSP (RADIOLOG
120.0000 mg | Freq: Once | INTRAMUSCULAR | Status: AC
  Administered 2016-07-29: 120 mg via EPIDURAL

## 2016-07-29 MED ORDER — IOPAMIDOL (ISOVUE-M 200) INJECTION 41%
1.0000 mL | Freq: Once | INTRAMUSCULAR | Status: AC
  Administered 2016-07-29: 1 mL via EPIDURAL

## 2016-08-02 ENCOUNTER — Other Ambulatory Visit: Payer: Self-pay | Admitting: Pulmonary Disease

## 2016-08-03 ENCOUNTER — Telehealth: Payer: Self-pay | Admitting: Pulmonary Disease

## 2016-08-03 NOTE — Telephone Encounter (Signed)
Per SN: please call [facility] - pt is out of his Colchicine 0.6mg  BID, this is requiring PA.  #180 with 4 refills.  Called [facility], spoke with rep Archie Patten.  Colchicine is NOT requiring a PA.  The cost $304 for a 30 day supply.  No alternative meds available.  Did attempt a PA to see if this would help, was sent to clinical review  Discussed with SN: please call [facility] to see what they can do.  Called [facility] and spoke with pharmacist Glendell Docker > the generic isn't much better than the brand name >> $208 for #30 tabs.  Best bet may be Indomethacine for about $45 for #30.  Discussed with SN: Indomethacine is not appropriate for pt; he does not have Gout, he has Pseudogout.  Will call pt.  Will route to Leigh in case there is anything further that needs to be done/documented.

## 2016-09-01 ENCOUNTER — Other Ambulatory Visit: Payer: Self-pay | Admitting: Pulmonary Disease

## 2016-11-23 ENCOUNTER — Other Ambulatory Visit: Payer: Self-pay | Admitting: Pulmonary Disease

## 2016-11-24 ENCOUNTER — Other Ambulatory Visit: Payer: Self-pay | Admitting: Neurosurgery

## 2016-11-24 DIAGNOSIS — M48061 Spinal stenosis, lumbar region without neurogenic claudication: Secondary | ICD-10-CM

## 2016-11-30 ENCOUNTER — Other Ambulatory Visit: Payer: Self-pay | Admitting: Pulmonary Disease

## 2016-12-10 ENCOUNTER — Ambulatory Visit
Admission: RE | Admit: 2016-12-10 | Discharge: 2016-12-10 | Disposition: A | Discharge status: Home / Self Care | Payer: PRIVATE HEALTH INSURANCE | Source: Ambulatory Visit | Attending: Neurosurgery | Admitting: Neurosurgery

## 2016-12-10 DIAGNOSIS — M48061 Spinal stenosis, lumbar region without neurogenic claudication: Secondary | ICD-10-CM

## 2016-12-10 MED ORDER — IOPAMIDOL (ISOVUE-M 200) INJECTION 41%: 1.0000 mL | Freq: Once | INTRAMUSCULAR | Status: DC

## 2016-12-10 MED ORDER — METHYLPREDNISOLONE ACETATE 40 MG/ML INJ SUSP (RADIOLOG: 120.0000 mg | Freq: Once | INTRAMUSCULAR | Status: DC

## 2016-12-24 ENCOUNTER — Ambulatory Visit
Admission: RE | Admit: 2016-12-24 | Discharge: 2016-12-24 | Disposition: A | Discharge status: Home / Self Care | Payer: PRIVATE HEALTH INSURANCE | Source: Ambulatory Visit | Attending: Neurosurgery | Admitting: Neurosurgery

## 2016-12-24 MED ORDER — METHYLPREDNISOLONE ACETATE 40 MG/ML INJ SUSP (RADIOLOG
120.0000 mg | Freq: Once | INTRAMUSCULAR | Status: AC
  Administered 2016-12-24: 120 mg via EPIDURAL

## 2016-12-24 MED ORDER — IOPAMIDOL (ISOVUE-M 200) INJECTION 41%
1.0000 mL | Freq: Once | INTRAMUSCULAR | Status: AC
  Administered 2016-12-24: 1 mL via EPIDURAL

## 2017-01-27 ENCOUNTER — Other Ambulatory Visit: Payer: Self-pay | Admitting: Pulmonary Disease

## 2017-01-28 ENCOUNTER — Other Ambulatory Visit: Payer: Self-pay | Admitting: Pulmonary Disease

## 2017-01-28 DIAGNOSIS — I10 Essential (primary) hypertension: Secondary | ICD-10-CM

## 2017-01-28 DIAGNOSIS — R1030 Lower abdominal pain, unspecified: Secondary | ICD-10-CM

## 2017-01-28 DIAGNOSIS — R1310 Dysphagia, unspecified: Secondary | ICD-10-CM

## 2017-02-01 ENCOUNTER — Encounter: Payer: Self-pay | Admitting: Pulmonary Disease

## 2017-02-01 ENCOUNTER — Ambulatory Visit (INDEPENDENT_AMBULATORY_CARE_PROVIDER_SITE_OTHER): Payer: PRIVATE HEALTH INSURANCE | Admitting: Pulmonary Disease

## 2017-02-01 VITALS — BP 126/70 | HR 70 | Ht 69.0 in | Wt 171.2 lb

## 2017-02-01 DIAGNOSIS — M5136 Other intervertebral disc degeneration, lumbar region: Secondary | ICD-10-CM

## 2017-02-01 DIAGNOSIS — I10 Essential (primary) hypertension: Secondary | ICD-10-CM

## 2017-02-01 DIAGNOSIS — M51369 Other intervertebral disc degeneration, lumbar region without mention of lumbar back pain or lower extremity pain: Secondary | ICD-10-CM

## 2017-02-01 DIAGNOSIS — R1030 Lower abdominal pain, unspecified: Secondary | ICD-10-CM

## 2017-02-01 DIAGNOSIS — M159 Polyosteoarthritis, unspecified: Secondary | ICD-10-CM

## 2017-02-01 DIAGNOSIS — M8949 Other hypertrophic osteoarthropathy, multiple sites: Secondary | ICD-10-CM

## 2017-02-01 DIAGNOSIS — M48062 Spinal stenosis, lumbar region with neurogenic claudication: Secondary | ICD-10-CM

## 2017-02-01 DIAGNOSIS — E782 Mixed hyperlipidemia: Secondary | ICD-10-CM

## 2017-02-01 DIAGNOSIS — M1189 Other specified crystal arthropathies, multiple sites: Secondary | ICD-10-CM

## 2017-02-01 DIAGNOSIS — K219 Gastro-esophageal reflux disease without esophagitis: Secondary | ICD-10-CM

## 2017-02-01 DIAGNOSIS — R05 Cough: Secondary | ICD-10-CM

## 2017-02-01 DIAGNOSIS — R1319 Other dysphagia: Secondary | ICD-10-CM

## 2017-02-01 DIAGNOSIS — J301 Allergic rhinitis due to pollen: Secondary | ICD-10-CM

## 2017-02-01 DIAGNOSIS — R053 Chronic cough: Secondary | ICD-10-CM

## 2017-02-01 DIAGNOSIS — M150 Primary generalized (osteo)arthritis: Secondary | ICD-10-CM

## 2017-02-01 NOTE — Patient Instructions (Addendum)
Today we updated your med list in our EPIC system...    Continue your current medications the same...  We discussed decreasing or stopping your Celebrex w/ planned f/u BMet in 1 month (we will also check a routine HepC Ab test)...  We will contact you w/ the results when available...   We also discussed trying to get approval for a CT Chest, Abd, and Pelvis- as per our discussion...  We will contact your insurance company for prior approval for these important scans & let you know their decision...  We refilled your Albuterol rescue inhaler (Proair or similar) for as needed use...  Let us know if you need any other refills or if we can be of service in any way.Marland KitchenMarland Kitchen

## 2017-02-01 NOTE — Progress Notes (Addendum)
Subjective:    Patient ID: Peter Bachelor, MD, male    DOB: 1945-12-04, 71 y.o.   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  see prob list below:  ~  June 20, 2010:  Add-on today at pt request for intermittent R>L hip area pain, worse recently & Rx w/ OTC Ibuprofen w/ some help... no prev ortho problems, but mild OA apparent on exam fingers etc... exam shows good ROM w/o pain, non-tender, no skin lesions, etc... we discussed Rx w/ MOBIC, check XRays, & next step= Ortho eval for poss shot in the area.  ~  April 24, 2011:  67mo ROV & CPX> Peter [name] has had a number of medical complaints as outlined in his accompanying note> wife was concerned all this was having a toll & that Peter [name] might be depressed, we discussed this & decided to hold off on any new meds at this time...    He had dizziness episode, near syncope, & saw [facility] 6/12 for Cards eval> EKG NSR, WNL; CDopplers w/ min plaque, no signif stenoses; he did not feel that further eval was warranted...    He's had an excellent Rheum eval from [facility] w/ OA & CPPD> on Colcrys 1.5 tabs daily, plus Celebrex 200mg  Bid prn... He's had Dupytren's injection in hands from DrSypher as well and some triggering of the right 4th digit...    Peter [name] noted muscle fasciculations & had a thorough eval from [place] (we don't have his notes)> pt indicates that they are felt to be secondary to his lumbar spondylosis (3 levels) for which he has had several epidural steroid shots & has been checked by [facility]; EMG/ NCV were normal & no signs of upper motor neuron disease...  CXR 9/09 showed clear, WNL;  EKG= NSR, WNL;  LABS= FLP off Statin showed TChol 228 & LDL 152; Chems all wnl x LFTs borderline elev; CBC wnl; TSH/ PSA/ Sed all wnl...  CDopplers 03/2011> WNL  ~  April 11, 2012:  Yearly ROV & CPX>  Peter [name] has had a number of issues this past yr- mostly of an Orthopedic/ Neurological/ Allergic nature and outlined below...    No further severe  dizzy spells/ near syncope; BP remains stable, no chest pain/ angina, etc;  He tried off Statins for some time, no change in his status therefore restarted Crestor10mg /d...    Ortho problems included left rotator cuff tear and surg by DrWhitfield 64/40 complicated by an unusual allergic reaction to the suture material w/ erythema in skin and a blister, he took antibiotics & was seen by ID, required a second operation w/ sutures changed 11/12, and a subspecialty Derm eval at [facility] showing allergic reactions to Gentamicin, Bacitracin, Neosporin, Polysporin, Iodine, Triamcinolone...    He continues to f/u w/ DrHawkes for Rheum- CPPD/Pseudogout on Colchicine 0.6mg Bid & Celebrex 200mg  1-2 daily & tolerated well...    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    We reviewed prob list, meds, xrays and labs> see below>>  CXR 6/13 showed normal heart size, clear lungs, radiopaque sutures seen in left shoulder,  distal clav resection, lower CSpine plate & screws, mild thoracic spondylosis...  ~  April 20, 2014:  30yrROV & CPX>  [name] gone part time [facility] & he is doing better overall; continued Orthopedic & Rheum issues as outlined below;  We reviewed the following medical problems during today's office visit >>     ALLERGIES> he has AR and demonstrated allergic reaction to suture material used on his left shoulder surg w/ erythema in skin and a blister; [facility] to Gentamicin, Bacitracin, Neosporin, Polysporin, Iodine, & Triamcinolone...     HEARING LOSS> he has bilat hearing  aides from [employer]..    HBP> on Amlod10, Accupril10, HCT25, K20; BP= 132/70 and he denies CP, palpit, SOB, edema; he exercises at the gym & walking; not yet back to playing golf due to his back surg...     Hx Rheumatic fever age14, prob pericarditis in 2008> see results of prev evaluations w/ Myoview, CoronaryCTA, 2DEcho, CDopplers below...     CHOL> on Crestor10; FLP done 4/15 [facility] showed TChol 174, TG 62, HDL 60, LDL 102...    GI- GERD, Polyps> on Zantac150Bid; [facility]- neg, no recurrent polyps & last polyp removed 4/04= hyperplastic...     ORTHO- DJD, CPPD, Hx left rotator cuff repair 10/12 by DrWhitfield, [facility]), s/p bilat carpal tunnel releases by DrSypher 1/15 & 2/15...  He has resid LBP, DJD & pseudogout symptoms- on Celebrex200Bid, Colchicine0.6Bid, & Plaquenil200 [facility]     Hx Melanoma> level 2 SSM removed from abd wall 2002, [facility].. No known recurrence...    Fam Hx alzheimer's dis> both parents had severe AD...  We reviewed prob list, meds, xrays and labs> see below for updates >>   CXR 11/14 showed norm heart size, clear lungs, NAD; plate & screw fixation device in lower Cspine, sutures seen over the left shoulder, s/p resection of the distal left clavicle, DDD in TSpine...   EKG 11/14 showed SBrady, rate58, wnl, NAD...   LABS 4/15:  FLP- at goals on Cres10 x LDL=102;  Chems- wnl;  CBC- wnl;  TSH=1.72;  PSA= 1.74...  BONE DENSITY 2/15:  Normal w/ lowest Tscore= -0.5 in left femoral neck...   ~  April 22, 2015:  89yrOV & add-on appt requested for painless knot in right mid thigh anteriorly>  He noticed this abn recently while wearing shorts and rubbing his leg; no skin lesion & knot not visible on inspection; no known trauma or  bruising; he has not exercised in quite awhile due to his severe pseudogout/CPPD; palpation of the area yields an oblong knot under the skin in upper/mid thigh w/ palp extension prox & distally that indicates the lesion is in the rectus femoris muscle;  It is totally non-tender;  On the left leg one can feel the rectus femoris muscle w/o this knot like area present...    I asked Dr. ZaCharlann Boxero check this lesion w/ his ultrasound machine & it was his opinion that this looked most like a sm hematoma in the muscle=> recommendation to follow this clinically & recheck in about 6 weeks.    We reviewed the following medical problems during today's office visit >> this visit will serve as his  yearly physical as well >>     ALLERGIES> he has AR and demonstrated allergic reaction to suture material used on his left shoulder surg w/ erythema in skin and a blister; he reports Derm eval at [facility] showing allergic reactions to Gentamicin, Bacitracin, Neosporin, Polysporin, Iodine, & Triamcinolone...     HEARING LOSS> he has bilat hearing aides from [employer]...    HBP> on Amlod10, Accupril10, HCT25, K20; BP= 134/74 and he denies CP, palpit, SOB, edema; he was exercises at the gym & walking; we discussed returning to exercise program...    Hx Rheumatic fever age14, prob pericarditis in 2008> see results of prev evaluations w/ Myoview, CoronaryCTA, 2DEcho, CDopplers below...     CHOL> on Crestor10; FLP done 3/16 [facility] showed TChol 169, TG 79, HDL 63, LDL 90...    GI- GERD, Polyps> on Zantac150Bid; followed by [facility], last colonoscopy was 5/11- neg, no recurrent polyps & last polyp removed 4/04= hyperplastic...     ORTHO- DJD, CPPD, Hx left rotator cuff repair 10/12 by DrWhitfield, [facility]Spine surg [facility], Lumbar spine decompression & 2 level fusion 12/14 by [facility] (for spinal stenosis & myelopathy- improved), s/p bilat carpal tunnel releases by DrSypher 1/15 & 2/15...  He has resid LBP, DJD &  pseudogout symptoms- on Celebrex200Bid, Colchicine0.6Bid, & off Plaquenil prev rx by Rheum= [facility]...     Hx Melanoma> level 2 SSM removed from abd wall 2002, followed by Derm- Dr. Milagros Reap at [facility]... No known recurrence...    Fam Hx alzheimer's dis> both parents had severe AD...  We reviewed prob list, meds, xrays and labs> see below for updates >>   LABS 3/16 showed FLP- at goals on Cres10;  Chems- wnl;  CBC- wnl;  TSH=2.74;  PSA=2.68  [employer] required biometric screening>    Height= 5' 9.5"  Weight= 171 lbs  Waist=34"  Neck circumference= 16"  Body Fat Calculation= 19%   BP= 134/74   BS= 98 and no need for HgA1c measurement w/ all normal blod sugars   FLP= TChol 169, TG 79, HDL 63, LDL 90  ADDENDUM>> MRI right thigh area 04/26/15 rec by DrMaxwell> Focal area of thickening of the rectus femoris tendon in the anterior aspect of the mid right thigh with an area of fatty atrophy surrounding the area tendon thickening consistent with remote trauma. There are small calcifications in the tendon distal to this area of fatty atrophy, demonstrated on informal ultrasound examination...   ~  February 01, 2017:  21 month ROV & review mult medical issues>  We reviewed the following medical problems during today's office visit >>     ALLERGIES> he has AR and demonstrated allergic reaction to suture material used on his left shoulder surg w/ erythema in skin and a blister; he had Derm eval at [facility] showing allergic reactions to Gentamicin, Bacitracin, Neosporin, Polysporin, Iodine, & Triamcinolone... Now c/o seasonal nasal allergy- on OTC Antihist (intol w/ retention)/ Nasacort/ Saline and Rx Singulair10 w/ prn AlbutHFA rescue inhaler... persistent cough on Tramadol/ Tessalon.    HEARING LOSS> he has bilat hearing aides from [employer]...    HBP> on ASA81, Amlod10, Accupril10, Dyazide, K20; BP= 126/70 and he denies CP, palpit, SOB, edema; he was exercises at the gym & walking; we discussed  returning to exercise program...    Hx Rheumatic fever age14, prob pericarditis in 2008> see results of prev evaluations w/ Myoview, CoronaryCTA, 2DEcho, CDopplers below...     CHOL> on Crestor10; FLP  done 3/18 [facility] showed  TChol 126, TG 74, HDL 66, LDL 45...    GI- GERD, Polyps> on Zantac150Bid; followed by [facility]- candida rx & dysphagia improved; last colonoscopy was 5/11- neg, no recurrent polyps & last polyp removed 4/04= hyperplastic...     ORTHO- DJD, CPPD, Hx left rotator cuff repair 10/12 by DrWhitfield, [facility]Spine surg 2003 by [facility], Lumbar spine decompression & 2 level fusion 12/14 by [facility] (for spinal stenosis & myelopathy- improved), s/p bilat carpal tunnel releases by DrSypher 1/15 & 2/15; then R-TKR by DrAlusio 09/2015...  He has resid LBP, DJD & pseudogout symptoms- on Celebrex200, Colchicine0.6Bid, & off prev Plaquenil prev rx by Rheum= Dr. [facility]renda [facility]ale at [facility]...     Hx Melanoma> level 2 SSM removed from abd wall 2002, followed by Derm- Dr. Milagros Reap at [facility]... No known recurrence...    Fam Hx alzheimer's dis> both parents had severe AD...     He has been eval by Rheum- [facility]Jonas [facility] 11/17 for his Pseudogout (CPPD)> s/p right TKA (09/2015 by DrAlusio), LBP from spinal stenosis w/ neurogernic claudication, s/p prev lumbar surg, s/p mult ESIs which last a few months at best, s/p neck surg, s/p rotator cuff surg, etc;  They reviewed Rx w/ Celebrex, Tramadol, Tylenol, Pred, Clcicine, etc...  EXAM shows Afeb, VSS, O2sat=98% on RA;  HEENT- neg, mallampati2;  Chest- clear w/o w/r/r;  Heart- RR w/o m/r/g;  Abd- soft, nontender, neg;  Ext- s/p R=TKR, severe pseudogout;  Neuro- intact, no focal deficits...  EKG 06/10/15>  NSR, rate77, WNL, NAD...  2DEcho 06/03/15>  Norm LV size & function w/ EF=65-70%, no regional wall motion abn, Gr1DD, norm valves (mild TR)/ atria/ RV & PA pressures  Lumbar epidural injection 12/10/16> Left L1-2 injected w/ Depomedrol & Lidocaine by DrShogry (last  prev injection was 07/29/16)  LABS 01/18/17 ([facility])>  FLP- TChol 126, TG 74, HDL 66, LDL 45;  Chems- ok w/ Cr=1.24 (GFR est~58);  CBC- wnl w/ Hg=14.2, mcv93, wbc10.5;  TSH=1.72;  PSA=2.09 IMP/PLAN>>  Peter [name] has already held his Celebrex w/ Cr up to 1.24 & we will recheck labs in about 51mo;  His Ortho/ Rheum problems are clearing rate-limiting for him- continue Colchicine, Tramadol, Tylenol...  ADDENDUM>>  Peter [name] called w/ come bruising & we will check f/u CBC, PT/PTT, SPE w/ reflex for completeness when we recheck his BMet off the Celebrex...  ADDENDUM>>  Peter [name] called w/ new c/o gynecomastia (bilat, non-tender, no galactorrhea); we reviewed his med list- no new meds and no obvious culprit medication- eg Aldactone, Cimetadine, Finasteride;  He is taking a POSSIBLE culprit = Amlod, and several VERY UNLIKELY meds = Zantac, Crestor, ACE;  He is concerned that it may be his FLOMAX & wants to stop this med first & see what happens...     Problem List:  ALLERGIC RHINITIS (ICD-477.9) - eval by DrKozlow et al in 2006... seasonal symptoms treated w/ OTC meds as needed... prev on SINGULAIR 10mg  prn, and NASONEX 2sp Qhs... ~  Note: he had allergic reaction to suture material after left rotator cuff surg 10/12; Specialty Derm eval at [facility] showed allergic to Gentamicin, Bacitracin, Neosporin, Polysporin, Iodine, Triamcinolone...  TINNITUS, CHRONIC - eval by DrKraus in the past... chronic symptom- deals with it effectively using white noise etc... HEARING LOSS - he now has bilat hearing aides and doing better...  HYPERTENSION (ICD-401.9) - controlled on meds- ASA 81mg /d,  NORVASC 10mg /d,  ACCUPRIL 10mg /d,  HCTZ 25mg /d,  KCl 63mEq/d...   ~  6/12:  BP= 134/84 today, takes meds regularly & tol well w/o side effects... BP checks at home and work are similar... denies HA, fatigue, visual changes, CP, palipit, syncope, dyspnea, edema, etc...  ~  6/13:  BP= 138/76 & he remains asymptomatic w/o CP, palpit, SOB,  edema, etc... ~  CXR 11/14 showed norm heart size, clear lungs, post op ortho surg changes...  ~  6/15:  BP= 132/70 & he denies any CP, palpit, SOB, edema, etc... ~  6/16:  BP= 134/74 & he remains asymptomatic on  Amlod10, Accupril10, HCT25, K20...  Hx of RHEUMATIC FEVER (ICD-390) - age 63, hosp for 3 days- no known sequelae; eval by cardiology in the 80's & no valvular heart disease...  Hx of CHEST PAIN w/ neg cardiac eval in 2008... EKG is normal> Episode of DIZZINESS/ Presyncope 6/12 w/ eval by Benay Spice... ~  Cardiolite in 2005 was neg- no ischemia & EF=60%...  ~  he was hosp w/ CP episode 3/08 by DrBBrodie- felt to be prob pericarditis...  ~  Coronary CTA in 2008 showing sl plaque at an LAD branch vessel (D2), mixed plaque in CIRC, no plaque in prox-mid RCA; Calcium score 0.65 (<25th percentlie)...  ~  2DEcho 3/08 showed no signif valvular lesions, LVwall thickness at upper limit, no regional wall motion abn, LVF= 50-55%... ~  CDopplers 6/12 showed min plaque, no signif ICA stenoses... ~  6/13:  No recurrence of the severe dizziness, near syncope, etc... ~  EKG 11/14 showed SBrady, rate58, wnl, NAD.  VENOUS INSUFFIC >> he has chr ven insuffic changes in LEs but no signif edema on the HCT & low salt diet...   HYPERCHOLESTEROLEMIA, BORDERLINE (ICD-272.4) - he started taking Lipitor prev due to reports of poss benefits in Alzheimer's prevention; Prev on LIPITOR 40mg /d and LOVAZA 4gm/d; we stopped the Statin Rx w/ his fasciculations & other symptoms but there was no change off the medication; f/u FLP 6/12 w/ LDL 152 & we decided to try CRESTOR 10mg /d as a trial. ~  FLP 4/08 on Lipitor40 showed TChol 138, TG 45, HDL 49, LDL 80 ~  FLP 9/09 on ?diet alone showed TChol 171, TG 54, HDL 56, LDL 104 ~  FLP 3/11 on Lip40+Lovaza showed TChol 124, TG 51, HDL 63, LDL 51 ~  FLP 6/12 off Lip40 on Lovaza showed TChol 228, TG 53, HDL 58, LDL 152... rec Cres10. ~  FLP 3/13 on Cres10 ([facility]) showed TChol  165, TG 57, HDL 65, LDL 89... Continue same. ~  [place] 4/15 on Cres10 ([facility]) showed TChol 174, TG 62, HDL 60, LDL 102 ~  FLP 3/16 on Cres10 ([facility]) showed  TChol 169, TG 79, HDL 63, LDL 90  GERD (ICD-530.81) - he has been evaluated by [facility]... Prev on PROTONIX but now uses ZANTAC 150Bid...   COLONIC POLYPS, HX OF (ICD-V12.72) - colonoscopy 4/04 by YNWGNFAO w/ diminutive hyperplastic polyp removed & sm int hem...   ~  f/u colonoscopy done 5/11 was neg- no polyps...  DEGENERATIVE JOINT DISEASE (ICD-715.90)> on CELEBREX 200mg  Bid... Hx of PSEUDOGOUT (ICD-275.49) - he has hx CPPD w/ eval by Rheum here, [facility], & [facility]> Rx w/ COLCRYS 0.6mg  Bid... ~  8/11: presented w/ c/o R>L hip pain, XRays showed degen arthritis & mild chondrocalcinosis. ~  Subsequent evals by Rheum- DrHawkes & [facility] confirmed CPPD ~  He has right hip area pain> from lumbar spondylosis & improved after several ESI injections... ~  s/p bilat carpal tunnel releases by  DrSypher 1/15 & 2/15... ~  6/15: now followed by Dr. Berenda Morale at [facility] on PLAQUENIL 200mg /d... ~  6/16: he remains on Celebrex200Bid, colchicine 0.6Bid, & off the Plaquenil now... ~  Note: Peter [name] had mult arthrocenteses w/ steroid/lodocaine injections into right knee over 1-92yrs... ~  12/16: he had right TKR by DrAlusion w/ difficult painful post-op rehab  LEFT SHOULDER ROTATOR CUFF TEAR >> he is s/p left shoulder surg 10/12 by DrWhitfield, followed by post op left shoulder inflammation- ?infectious (none found), ?allergic reaction to suture material, required second operation 11/12 to remove sutures & replace w/ stainless steel sutures==> all resolved now, operative notes reviewed...  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 also lumbar spondy w/ spurs at L4-5 seen on  prev CTAbd done 12/06... subseq MRI Lumbar area w/ multilevel spondylosis, mod central canal stenosis & 3 level lateral recess stenosis> several epid steroid shots by IR w/ partial relief... ~  6/13:  He reports being on GABAPENTIN 300mg  Tid per DrLove... ~  12/14:  He had Lumbar spine decompression & 2 level fusion by DrNudelman (for spinal stenosis & myelopathy- improved)...  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 followed closely by DrPatMauro at [facility] & no known recurrence (switching to [facility]- DrJones)...  ALZHEIMER'S DISEASE, FAMILY HX (ICD-V17.0) - family hx of Alzheimer's disease in both his mother and father (both died in their 23's w/ this disease)...  R/O Depression>  6/12 wife shared her concern for Peter [name]'s mood esp as it regards his medical illnesses; after 3way discussion we decided to hold off on any med Rx but will reconsider if needed in follow up... ~  6/13:  He reports doing better after his surgeries, ESI shots, Derm/Allergy eval at [facility], etc;  He is getting more exercise & played 9holes of golf recently...  DERMATOLOGY/ ALLERGY TESTING at [facility] >> pages supplied by DrBarry scanned into the EPIC EMR for reference- +reaction to Triamcinolone (should avoid Budesonide, Flunisolide, Fluocinolone, hydrocortisone)...   Past Medical History:  Diagnosis Date  . Allergic rhinitis   . Anxiety   . Cancer [facility])    Melanoma -peri umbilical '02 or '03- no further problems. 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   . Chest pain    Nuclear 2005, normal / coronary CTA 2008, slight mixed plaque, coronary calcium score 0.65, ejection fraction 55%, echo, March, 2008  . Complication of anesthesia    itching-not sure if oxycodone or anesthesia,and shakes. Right TMJ issue  . Dizziness    Dizziness with question  of presyncope April 08, 2011  . DJD (degenerative joint disease)    history DDD, fingers, knees, shoulders  . Ejection fraction    EF 55%, echo, March, 2008.  . Family history of Alzheimer's disease   . GERD (gastroesophageal reflux disease)   . H/O hiatal hernia   . Hearing impaired    bilateral hearing aids  . Heart murmur    71 yrs old rheumatic fever   . Heart murmur 2008   trivial tricusp regurg  . History of colonic polyps   . History of melanoma   . History of pseudogout   .  History of recent steroid use    10-10-15 tapering steroid use for tx. recent bronchitis.  Marland Kitchen History of rheumatic fever   . Hypercholesteremia    borderline  . Hypertension   . Lumbar spondylosis with myelopathy 06/09/2013  . Spondylosis   . Tinnitus    chronic  . TMJ click    right"was aggravated with last intubation"    Past Surgical History:  Procedure Laterality Date  . ANTERIOR CERVICAL DECOMP/DISCECTOMY FUSION  2003  . CARPAL TUNNEL RELEASE Right 10/31/2013   Procedure: RIGHT CARPAL TUNNEL RELEASE;  Surgeon: Cammie Sickle., MD;  Location: [facility];  Service: Orthopedics;  Laterality: Right;  . CARPAL TUNNEL RELEASE Left 11/28/2013   Procedure: LEFT CARPAL TUNNEL RELEASE;  Surgeon: Cammie Sickle., MD;  Location: [facility];  Service: Orthopedics;  Laterality: Left;  . CERVICAL DISCECTOMY     anterior with patellar allograft and plating  . LUMBAR FUSION  12/14  . MELANOMA EXCISION WITH SENTINEL LYMPH NODE BIOPSY  8/02   bilat ing node bx  . ROTATOR CUFF REPAIR Left    08/13/11  08/27/2011  . TOTAL KNEE ARTHROPLASTY Right 10/07/2015   Procedure: RIGHT TOTAL KNEE ARTHROPLASTY, with synovial tissue specimen;  Surgeon: [name], MD;  Location: [facility];  Service: Orthopedics;  Laterality: Right;    Outpatient Encounter Prescriptions as of 02/01/2017  Medication Sig  . ALPRAZolam (XANAX) 0.5 MG tablet Take 1/2 to 1 tablet by mouth three times daily as  needed for anxiety  . amLODipine (NORVASC) 10 MG tablet Take 1 tablet (10 mg total) by mouth daily.  Marland Kitchen aspirin 81 MG chewable tablet Chew 81 mg by mouth daily.  . colchicine 0.6 MG tablet TAKE 1 TABLET BY MOUTH TWO  TIMES DAILY  . cycloSPORINE (RESTASIS) 0.05 % ophthalmic emulsion Place 1 drop into both eyes 2 (two) times daily.  . hydrochlorothiazide (HYDRODIURIL) 25 MG tablet Take 1 tablet by mouth  daily  . montelukast (SINGULAIR) 10 MG tablet Take 1 tablet (10 mg total) by mouth at bedtime.  . potassium chloride SA (K-DUR,KLOR-CON) 20 MEQ tablet Take 1 tablet (20 mEq total) by mouth daily.  . quinapril (ACCUPRIL) 10 MG tablet TAKE 1 TABLET BY MOUTH  DAILY  . ranitidine (ZANTAC) 150 MG capsule Take 150 mg by mouth daily.   . rosuvastatin (CRESTOR) 10 MG tablet Take 1 tablet (10 mg total) by mouth at bedtime.  . traMADol (ULTRAM) 50 MG tablet Take 1-2 tablets (50-100 mg total) by mouth every 6 (six) hours as needed (mild pain).  . [DISCONTINUED] buPROPion (WELLBUTRIN) 75 MG tablet Take 75 mg by mouth daily.   . [DISCONTINUED] celecoxib (CELEBREX) 200 MG capsule TAKE 1 CAPSULE BY MOUTH  TWICE DAILY  . [DISCONTINUED] HYDROmorphone (DILAUDID) 2 MG tablet Take 1-2 tablets (2-4 mg total) by mouth every 4 (four) hours as needed for moderate pain or severe pain.  . [DISCONTINUED] methocarbamol (ROBAXIN) 500 MG tablet Take 1 tablet (500 mg total) by mouth every 6 (six) hours as needed for muscle spasms.  . [DISCONTINUED] Potassium Chloride ER 20 MEQ TBCR TAKE 1 TABLET BY MOUTH  DAILY   No facility-administered encounter medications on file as of 02/01/2017.     Allergies  Allergen Reactions  . Gentamycin [Gentamicin] Hives and Itching  . Turmeric Other (See Comments)    Cardia arrythmias  . Bacitracin Rash  . Moxifloxacin Rash    pt had severe red rash- he thought from Avelox  Rx....  . Neosporin [Neomycin-Bacitracin Zn-Polymyx] Rash  . Other Other (See Comments)    Redness  bandaide  .  Promethazine Other (See Comments)    Restless legs  . Triamcinolone Acetonide Itching and Rash          Current Medications, Allergies, Past Medical History, Past Surgical History, Family History, and Social History were reviewed in [employer] record.   Review of Systems         See HPI - all other systems neg except as noted...  The patient denies anorexia, fever, weight loss, weight gain, vision loss, decreased hearing, hoarseness, chest pain, syncope, dyspnea on exertion, peripheral edema, prolonged cough, headaches, hemoptysis, abdominal pain, melena, hematochezia, severe indigestion/heartburn, hematuria, incontinence, muscle weakness, suspicious skin lesions, transient blindness, difficulty walking, depression, unusual weight change, abnormal bleeding, enlarged lymph nodes, and angioedema.     Objective:   Physical Exam     WD, WN, 71 y/o WM in NAD... Vital Signs:  Reviewed... GENERAL:  Alert & oriented; pleasant & cooperative... HEENT:  [place]/AT, EOM-wnl, PERRLA, EACs-clear, TMs-wnl, NOSE-clear, THROAT-clear & wnl. NECK:  Supple w/ fairROM s/p surg; no JVD; normal carotid impulses w/o bruits; no thyromegaly or nodules palpated; no lymphadenopathy. CHEST:  Clear to P & A; without wheezes/ rales/ or rhonchi. HEART:  Regular Rhythm; without murmurs/ rubs/ or gallops. ABDOMEN:  Soft & nontender; right peri-umbil scar of melanoma surg; normal bowel sounds; no organomegaly or masses palpated. (RECTAL:  Neg - prostate 2+ & nontender w/o nodules; stool hematest neg; groin lymphoceles) EXT:  mild OA fingers- sl decr ROM hips/ knees; no varicose veins/ +venous insuffic/ no edema; pulses intact & WNL. NEURO:  CN's intact; motor testing normal; sensory testing normal; gait normal & balance OK; scar of prev Lumbar & CSpine surg... DERM:  No lesions noted; scar left shoulder & scar to right of umbilicus.  RADIOLOGY DATA:  Reviewed in the EPIC EMR & discussed w/ the  patient...  LABORATORY DATA:  Reviewed in the EPIC EMR & discussed w/ the patient...   Assessment & Plan:    HBP>  BP controlled on meds, continue same...  CARDIAC EVAL by Benay Spice in 2012 as outlined above... Pt is reassured...  HYPERCHOLESTEROLEMIA>  His LDL off Lipitor was 152 & he started on Crestor10 and f/u FLP looks good...  GI>  GERD, Polyps>  Stable on Zantac & up to date on screening...  RHEUM>  OA, CPPD>  Followed by Kathlynn Grate on Celebrex & Colcrys...  Hx CSpine fusion, LBP w/ decompression & fusion>  Known spondylosis & s/p several epidural steroid shots, ultimately had surg 12/14 by [facility] & improved...  S/P LEFT SHOULDER SURG w/ Complications>  As outlined above; he is finally better, w/ good ROM.Marland KitchenMarland Kitchen  Hx of Muscle Fasciculations>  He's had a thorough Neuro eval from [facility] w/ neg EMG/ NCV (no evid for upper motor neuron dis) & he is reassured...  ALLERGIC REACTION TO SUTURE MATERIAL>  This occurred after his left rotator cuff surg 10/12 7 required re-operation & chanfe to stainless steel wire sutures 11/12...  DERMATOLOGY Eval at [facility]>  Hx Melanoma w/o recurrence; [facility] eval w/ finding of reactions to Gentamicin, Bacitracin, Neosporin, Polysporin, Iodine, Triamcinolone, Gold, others...   Patient's Medications  New Prescriptions   No medications on file  Previous Medications   ALPRAZOLAM (XANAX) 0.5 MG TABLET    Take 1/2 to 1 tablet by mouth three times daily as needed for anxiety   AMLODIPINE (NORVASC) 10 MG TABLET  Take 1 tablet (10 mg total) by mouth daily.   ASPIRIN 81 MG CHEWABLE TABLET    Chew 81 mg by mouth daily.   COLCHICINE 0.6 MG TABLET    TAKE 1 TABLET BY MOUTH TWO  TIMES DAILY   CYCLOSPORINE (RESTASIS) 0.05 % OPHTHALMIC EMULSION    Place 1 drop into both eyes 2 (two) times daily.   HYDROCHLOROTHIAZIDE (HYDRODIURIL) 25 MG TABLET    Take 1 tablet by mouth  daily   MONTELUKAST (SINGULAIR) 10 MG TABLET    Take 1 tablet (10 mg total) by mouth at bedtime.    POTASSIUM CHLORIDE SA (K-DUR,KLOR-CON) 20 MEQ TABLET    Take 1 tablet (20 mEq total) by mouth daily.   QUINAPRIL (ACCUPRIL) 10 MG TABLET    TAKE 1 TABLET BY MOUTH  DAILY   RANITIDINE (ZANTAC) 150 MG CAPSULE    Take 150 mg by mouth daily.    ROSUVASTATIN (CRESTOR) 10 MG TABLET    Take 1 tablet (10 mg total) by mouth at bedtime.   TRAMADOL (ULTRAM) 50 MG TABLET    Take 1-2 tablets (50-100 mg total) by mouth every 6 (six) hours as needed (mild pain).  Modified Medications   No medications on file  Discontinued Medications   BUPROPION (WELLBUTRIN) 75 MG TABLET    Take 75 mg by mouth daily.    CELECOXIB (CELEBREX) 200 MG CAPSULE    TAKE 1 CAPSULE BY MOUTH  TWICE DAILY   HYDROMORPHONE (DILAUDID) 2 MG TABLET    Take 1-2 tablets (2-4 mg total) by mouth every 4 (four) hours as needed for moderate pain or severe pain.   METHOCARBAMOL (ROBAXIN) 500 MG TABLET    Take 1 tablet (500 mg total) by mouth every 6 (six) hours as needed for muscle spasms.   POTASSIUM CHLORIDE ER 20 MEQ TBCR    TAKE 1 TABLET BY MOUTH  DAILY

## 2017-02-08 ENCOUNTER — Other Ambulatory Visit: Payer: Self-pay | Admitting: Pulmonary Disease

## 2017-02-10 ENCOUNTER — Other Ambulatory Visit: Payer: PRIVATE HEALTH INSURANCE

## 2017-03-02 DIAGNOSIS — H43813 Vitreous degeneration, bilateral: Secondary | ICD-10-CM | POA: Diagnosis not present

## 2017-03-02 DIAGNOSIS — H52223 Regular astigmatism, bilateral: Secondary | ICD-10-CM | POA: Diagnosis not present

## 2017-03-02 DIAGNOSIS — H2512 Age-related nuclear cataract, left eye: Secondary | ICD-10-CM | POA: Diagnosis not present

## 2017-03-02 DIAGNOSIS — H25811 Combined forms of age-related cataract, right eye: Secondary | ICD-10-CM | POA: Diagnosis not present

## 2017-03-02 DIAGNOSIS — H04123 Dry eye syndrome of bilateral lacrimal glands: Secondary | ICD-10-CM | POA: Diagnosis not present

## 2017-03-02 DIAGNOSIS — H524 Presbyopia: Secondary | ICD-10-CM | POA: Diagnosis not present

## 2017-03-02 DIAGNOSIS — H5213 Myopia, bilateral: Secondary | ICD-10-CM | POA: Diagnosis not present

## 2017-03-08 DIAGNOSIS — L111 Transient acantholytic dermatosis [Grover]: Secondary | ICD-10-CM | POA: Diagnosis not present

## 2017-03-08 DIAGNOSIS — Z85828 Personal history of other malignant neoplasm of skin: Secondary | ICD-10-CM | POA: Diagnosis not present

## 2017-03-08 DIAGNOSIS — L821 Other seborrheic keratosis: Secondary | ICD-10-CM | POA: Diagnosis not present

## 2017-03-08 DIAGNOSIS — Z85820 Personal history of malignant melanoma of skin: Secondary | ICD-10-CM | POA: Diagnosis not present

## 2017-03-08 DIAGNOSIS — D692 Other nonthrombocytopenic purpura: Secondary | ICD-10-CM | POA: Diagnosis not present

## 2017-03-08 DIAGNOSIS — L570 Actinic keratosis: Secondary | ICD-10-CM | POA: Diagnosis not present

## 2017-03-09 ENCOUNTER — Other Ambulatory Visit: Payer: Self-pay | Admitting: Pulmonary Disease

## 2017-03-09 ENCOUNTER — Other Ambulatory Visit (INDEPENDENT_AMBULATORY_CARE_PROVIDER_SITE_OTHER): Payer: PRIVATE HEALTH INSURANCE

## 2017-03-09 DIAGNOSIS — Z0000 Encounter for general adult medical examination without abnormal findings: Secondary | ICD-10-CM

## 2017-03-09 LAB — CBC WITH DIFFERENTIAL/PLATELET
Basophils Absolute: 0.2 10*3/uL — ABNORMAL HIGH (ref 0.0–0.1)
Basophils Relative: 2.4 % (ref 0.0–3.0)
Eosinophils Absolute: 0.1 10*3/uL (ref 0.0–0.7)
Eosinophils Relative: 1.6 % (ref 0.0–5.0)
HCT: 42.3 % (ref 39.0–52.0)
Hemoglobin: 14.4 g/dL (ref 13.0–17.0)
Lymphocytes Relative: 20.3 % (ref 12.0–46.0)
Lymphs Abs: 1.5 10*3/uL (ref 0.7–4.0)
MCHC: 34.1 g/dL (ref 30.0–36.0)
MCV: 92.4 fl (ref 78.0–100.0)
Monocytes Absolute: 1.2 10*3/uL — ABNORMAL HIGH (ref 0.1–1.0)
Monocytes Relative: 15.7 % — ABNORMAL HIGH (ref 3.0–12.0)
Neutro Abs: 4.5 10*3/uL (ref 1.4–7.7)
Neutrophils Relative %: 60 % (ref 43.0–77.0)
Platelets: 358 10*3/uL (ref 150.0–400.0)
RBC: 4.58 Mil/uL (ref 4.22–5.81)
RDW: 12.7 % (ref 11.5–15.5)
WBC: 7.5 10*3/uL (ref 4.0–10.5)

## 2017-03-09 LAB — BASIC METABOLIC PANEL
BUN: 23 mg/dL (ref 6–23)
CO2: 30 mEq/L (ref 19–32)
Calcium: 9.9 mg/dL (ref 8.4–10.5)
Chloride: 102 mEq/L (ref 96–112)
Creatinine, Ser: 1.16 mg/dL (ref 0.40–1.50)
GFR: 66.34 mL/min (ref >60.00)
Glucose, Bld: 93 mg/dL (ref 70–99)
Potassium: 4 mEq/L (ref 3.5–5.1)
Sodium: 138 mEq/L (ref 135–145)

## 2017-03-09 LAB — PROTIME-INR
INR: 1 ratio (ref 0.8–1.0)
Prothrombin Time: 11.3 s (ref 9.6–13.1)

## 2017-03-09 LAB — HEPATITIS C ANTIBODY: HCV Ab: NEGATIVE

## 2017-03-10 LAB — PROTEIN ELECTROPHORESIS, SERUM, WITH REFLEX
Albumin ELP: 4.3 g/dL (ref 3.8–4.8)
Alpha-1-Globulin: 0.3 g/dL (ref 0.2–0.3)
Alpha-2-Globulin: 0.7 g/dL (ref 0.5–0.9)
Beta 2: 0.3 g/dL (ref 0.2–0.5)
Beta Globulin: 0.5 g/dL (ref 0.4–0.6)
Gamma Globulin: 0.9 g/dL (ref 0.8–1.7)
Total Protein, Serum Electrophoresis: 7.1 g/dL (ref 6.1–8.1)

## 2017-03-12 ENCOUNTER — Encounter: Payer: Self-pay | Admitting: Pulmonary Disease

## 2017-04-26 ENCOUNTER — Other Ambulatory Visit: Payer: Self-pay | Admitting: Neurosurgery

## 2017-04-26 DIAGNOSIS — M5440 Lumbago with sciatica, unspecified side: Secondary | ICD-10-CM

## 2017-05-05 ENCOUNTER — Ambulatory Visit
Admission: RE | Admit: 2017-05-05 | Discharge: 2017-05-05 | Disposition: A | Discharge status: Home / Self Care | Payer: Medicare Other | Source: Ambulatory Visit | Attending: Neurosurgery | Admitting: Neurosurgery

## 2017-05-05 DIAGNOSIS — M549 Dorsalgia, unspecified: Secondary | ICD-10-CM | POA: Diagnosis not present

## 2017-05-05 DIAGNOSIS — M5440 Lumbago with sciatica, unspecified side: Secondary | ICD-10-CM

## 2017-05-05 MED ORDER — METHYLPREDNISOLONE ACETATE 40 MG/ML INJ SUSP (RADIOLOG
120.0000 mg | Freq: Once | INTRAMUSCULAR | Status: AC
  Administered 2017-05-05: 120 mg via EPIDURAL

## 2017-05-05 MED ORDER — IOPAMIDOL (ISOVUE-M 200) INJECTION 41%
1.0000 mL | Freq: Once | INTRAMUSCULAR | Status: AC
  Administered 2017-05-05: 1 mL via EPIDURAL

## 2017-06-14 DIAGNOSIS — Z23 Encounter for immunization: Secondary | ICD-10-CM | POA: Diagnosis not present

## 2017-07-07 ENCOUNTER — Other Ambulatory Visit: Payer: Self-pay | Admitting: Neurosurgery

## 2017-07-07 DIAGNOSIS — M5440 Lumbago with sciatica, unspecified side: Secondary | ICD-10-CM

## 2017-07-08 ENCOUNTER — Other Ambulatory Visit: Payer: Self-pay | Admitting: Pulmonary Disease

## 2017-07-08 MED ORDER — ROSUVASTATIN CALCIUM 10 MG PO TABS: 10.0000 mg | ORAL_TABLET | Freq: Every day | ORAL | 3 refills | Status: DC

## 2017-07-08 MED ORDER — AMLODIPINE BESYLATE 10 MG PO TABS: 10.0000 mg | ORAL_TABLET | Freq: Every day | ORAL | 3 refills | Status: DC

## 2017-07-08 MED ORDER — QUINAPRIL HCL 10 MG PO TABS: 10.0000 mg | ORAL_TABLET | Freq: Every day | ORAL | 4 refills | Status: DC

## 2017-07-08 NOTE — Telephone Encounter (Signed)
Will need to send in for the colchicine to [email or web address] in [place].

## 2017-07-27 ENCOUNTER — Ambulatory Visit
Admission: RE | Admit: 2017-07-27 | Discharge: 2017-07-27 | Disposition: A | Discharge status: Home / Self Care | Payer: Medicare Other | Source: Ambulatory Visit | Attending: Neurosurgery | Admitting: Neurosurgery

## 2017-07-27 DIAGNOSIS — M47816 Spondylosis without myelopathy or radiculopathy, lumbar region: Secondary | ICD-10-CM | POA: Diagnosis not present

## 2017-07-27 DIAGNOSIS — M5440 Lumbago with sciatica, unspecified side: Secondary | ICD-10-CM

## 2017-07-27 MED ORDER — METHYLPREDNISOLONE ACETATE 40 MG/ML INJ SUSP (RADIOLOG
120.0000 mg | Freq: Once | INTRAMUSCULAR | Status: AC
  Administered 2017-07-27: 120 mg via EPIDURAL

## 2017-07-27 MED ORDER — IOPAMIDOL (ISOVUE-M 200) INJECTION 41%
1.0000 mL | Freq: Once | INTRAMUSCULAR | Status: AC
  Administered 2017-07-27: 1 mL via EPIDURAL

## 2017-07-27 NOTE — Discharge Instructions (Signed)

## 2017-07-28 ENCOUNTER — Other Ambulatory Visit: Payer: Self-pay | Admitting: Pulmonary Disease

## 2017-07-28 MED ORDER — FLUCONAZOLE 100 MG PO TABS: ORAL_TABLET | ORAL | 1 refills | Status: DC

## 2017-09-07 DIAGNOSIS — Z8739 Personal history of other diseases of the musculoskeletal system and connective tissue: Secondary | ICD-10-CM | POA: Diagnosis not present

## 2017-09-07 DIAGNOSIS — Z791 Long term (current) use of non-steroidal anti-inflammatories (NSAID): Secondary | ICD-10-CM | POA: Diagnosis not present

## 2017-09-07 DIAGNOSIS — I10 Essential (primary) hypertension: Secondary | ICD-10-CM | POA: Diagnosis not present

## 2017-09-07 DIAGNOSIS — M4326 Fusion of spine, lumbar region: Secondary | ICD-10-CM | POA: Diagnosis not present

## 2017-09-07 DIAGNOSIS — M5136 Other intervertebral disc degeneration, lumbar region: Secondary | ICD-10-CM | POA: Diagnosis not present

## 2017-09-07 DIAGNOSIS — Z79899 Other long term (current) drug therapy: Secondary | ICD-10-CM | POA: Diagnosis not present

## 2017-09-07 DIAGNOSIS — E785 Hyperlipidemia, unspecified: Secondary | ICD-10-CM | POA: Diagnosis not present

## 2017-09-07 DIAGNOSIS — Z96651 Presence of right artificial knee joint: Secondary | ICD-10-CM | POA: Diagnosis not present

## 2017-09-07 DIAGNOSIS — M48061 Spinal stenosis, lumbar region without neurogenic claudication: Secondary | ICD-10-CM | POA: Diagnosis not present

## 2017-09-22 ENCOUNTER — Other Ambulatory Visit: Payer: Self-pay | Admitting: Neurosurgery

## 2017-09-22 DIAGNOSIS — M5416 Radiculopathy, lumbar region: Secondary | ICD-10-CM

## 2017-09-22 DIAGNOSIS — M5440 Lumbago with sciatica, unspecified side: Secondary | ICD-10-CM

## 2017-09-22 DIAGNOSIS — I10 Essential (primary) hypertension: Secondary | ICD-10-CM | POA: Diagnosis not present

## 2017-09-22 DIAGNOSIS — Z981 Arthrodesis status: Secondary | ICD-10-CM | POA: Diagnosis not present

## 2017-09-22 DIAGNOSIS — M5136 Other intervertebral disc degeneration, lumbar region: Secondary | ICD-10-CM | POA: Diagnosis not present

## 2017-09-22 DIAGNOSIS — M4726 Other spondylosis with radiculopathy, lumbar region: Secondary | ICD-10-CM | POA: Diagnosis not present

## 2017-09-23 ENCOUNTER — Ambulatory Visit
Admission: RE | Admit: 2017-09-23 | Discharge: 2017-09-23 | Disposition: A | Discharge status: Home / Self Care | Payer: Medicare Other | Source: Ambulatory Visit | Attending: Neurosurgery | Admitting: Neurosurgery

## 2017-09-23 DIAGNOSIS — M5440 Lumbago with sciatica, unspecified side: Secondary | ICD-10-CM

## 2017-09-23 DIAGNOSIS — M48061 Spinal stenosis, lumbar region without neurogenic claudication: Secondary | ICD-10-CM | POA: Diagnosis not present

## 2017-09-23 MED ORDER — GADOBENATE DIMEGLUMINE 529 MG/ML IV SOLN
16.0000 mL | Freq: Once | INTRAVENOUS | Status: AC | PRN
  Administered 2017-09-23: 16 mL via INTRAVENOUS

## 2017-09-24 ENCOUNTER — Other Ambulatory Visit: Payer: Medicare Other

## 2017-09-24 DIAGNOSIS — M48062 Spinal stenosis, lumbar region with neurogenic claudication: Secondary | ICD-10-CM | POA: Diagnosis not present

## 2017-09-24 DIAGNOSIS — M4726 Other spondylosis with radiculopathy, lumbar region: Secondary | ICD-10-CM | POA: Diagnosis not present

## 2017-09-24 DIAGNOSIS — M5136 Other intervertebral disc degeneration, lumbar region: Secondary | ICD-10-CM | POA: Diagnosis not present

## 2017-09-24 DIAGNOSIS — M5440 Lumbago with sciatica, unspecified side: Secondary | ICD-10-CM | POA: Diagnosis not present

## 2017-09-24 DIAGNOSIS — Z981 Arthrodesis status: Secondary | ICD-10-CM | POA: Diagnosis not present

## 2017-09-27 ENCOUNTER — Other Ambulatory Visit: Payer: Self-pay | Admitting: Neurosurgery

## 2017-09-27 DIAGNOSIS — M4726 Other spondylosis with radiculopathy, lumbar region: Secondary | ICD-10-CM

## 2017-09-29 ENCOUNTER — Ambulatory Visit
Admission: RE | Admit: 2017-09-29 | Discharge: 2017-09-29 | Disposition: A | Discharge status: Home / Self Care | Payer: Medicare Other | Source: Ambulatory Visit | Attending: Orthopaedic Surgery | Admitting: Orthopaedic Surgery

## 2017-09-29 ENCOUNTER — Other Ambulatory Visit (INDEPENDENT_AMBULATORY_CARE_PROVIDER_SITE_OTHER): Payer: Self-pay | Admitting: *Deleted

## 2017-09-29 DIAGNOSIS — G8929 Other chronic pain: Secondary | ICD-10-CM

## 2017-09-29 DIAGNOSIS — M75102 Unspecified rotator cuff tear or rupture of left shoulder, not specified as traumatic: Secondary | ICD-10-CM | POA: Diagnosis not present

## 2017-09-29 DIAGNOSIS — M25512 Pain in left shoulder: Principal | ICD-10-CM

## 2017-09-29 DIAGNOSIS — M75112 Incomplete rotator cuff tear or rupture of left shoulder, not specified as traumatic: Secondary | ICD-10-CM | POA: Diagnosis not present

## 2017-10-01 ENCOUNTER — Other Ambulatory Visit (INDEPENDENT_AMBULATORY_CARE_PROVIDER_SITE_OTHER): Payer: Self-pay

## 2017-10-01 ENCOUNTER — Other Ambulatory Visit: Payer: Self-pay | Admitting: Pulmonary Disease

## 2017-10-01 MED ORDER — COLCHICINE 0.6 MG PO TABS: 0.6000 mg | ORAL_TABLET | Freq: Two times a day (BID) | ORAL | 3 refills | Status: DC

## 2017-10-06 DIAGNOSIS — L821 Other seborrheic keratosis: Secondary | ICD-10-CM | POA: Diagnosis not present

## 2017-10-06 DIAGNOSIS — Z85820 Personal history of malignant melanoma of skin: Secondary | ICD-10-CM | POA: Diagnosis not present

## 2017-10-06 DIAGNOSIS — L570 Actinic keratosis: Secondary | ICD-10-CM | POA: Diagnosis not present

## 2017-10-06 DIAGNOSIS — L111 Transient acantholytic dermatosis [Grover]: Secondary | ICD-10-CM | POA: Diagnosis not present

## 2017-10-06 DIAGNOSIS — L820 Inflamed seborrheic keratosis: Secondary | ICD-10-CM | POA: Diagnosis not present

## 2017-10-06 DIAGNOSIS — Z85828 Personal history of other malignant neoplasm of skin: Secondary | ICD-10-CM | POA: Diagnosis not present

## 2017-10-11 ENCOUNTER — Ambulatory Visit
Admission: RE | Admit: 2017-10-11 | Discharge: 2017-10-11 | Disposition: A | Discharge status: Home / Self Care | Payer: Medicare Other | Source: Ambulatory Visit | Attending: Neurosurgery | Admitting: Neurosurgery

## 2017-10-11 DIAGNOSIS — M545 Low back pain: Secondary | ICD-10-CM | POA: Diagnosis not present

## 2017-10-11 DIAGNOSIS — M4726 Other spondylosis with radiculopathy, lumbar region: Secondary | ICD-10-CM

## 2017-10-11 MED ORDER — METHYLPREDNISOLONE ACETATE 40 MG/ML INJ SUSP (RADIOLOG
120.0000 mg | Freq: Once | INTRAMUSCULAR | Status: AC
  Administered 2017-10-11: 120 mg via EPIDURAL

## 2017-10-11 MED ORDER — IOPAMIDOL (ISOVUE-M 200) INJECTION 41%
1.0000 mL | Freq: Once | INTRAMUSCULAR | Status: AC
  Administered 2017-10-11: 1 mL via EPIDURAL

## 2017-10-27 DIAGNOSIS — M48062 Spinal stenosis, lumbar region with neurogenic claudication: Secondary | ICD-10-CM | POA: Diagnosis not present

## 2017-10-27 DIAGNOSIS — I10 Essential (primary) hypertension: Secondary | ICD-10-CM | POA: Diagnosis not present

## 2017-10-27 DIAGNOSIS — Z981 Arthrodesis status: Secondary | ICD-10-CM | POA: Diagnosis not present

## 2017-10-27 DIAGNOSIS — M4726 Other spondylosis with radiculopathy, lumbar region: Secondary | ICD-10-CM | POA: Diagnosis not present

## 2017-10-27 DIAGNOSIS — M5136 Other intervertebral disc degeneration, lumbar region: Secondary | ICD-10-CM | POA: Diagnosis not present

## 2017-10-29 DIAGNOSIS — M75112 Incomplete rotator cuff tear or rupture of left shoulder, not specified as traumatic: Secondary | ICD-10-CM | POA: Diagnosis not present

## 2017-11-12 ENCOUNTER — Other Ambulatory Visit: Payer: Self-pay | Admitting: Neurosurgery

## 2017-11-12 DIAGNOSIS — M48062 Spinal stenosis, lumbar region with neurogenic claudication: Secondary | ICD-10-CM

## 2017-11-22 ENCOUNTER — Other Ambulatory Visit: Payer: Self-pay | Admitting: Neurosurgery

## 2017-11-22 ENCOUNTER — Ambulatory Visit
Admission: RE | Admit: 2017-11-22 | Discharge: 2017-11-22 | Disposition: A | Discharge status: Home / Self Care | Payer: Medicare Other | Source: Ambulatory Visit | Attending: Neurosurgery | Admitting: Neurosurgery

## 2017-11-22 DIAGNOSIS — M48062 Spinal stenosis, lumbar region with neurogenic claudication: Secondary | ICD-10-CM

## 2017-11-22 DIAGNOSIS — M48061 Spinal stenosis, lumbar region without neurogenic claudication: Secondary | ICD-10-CM | POA: Diagnosis not present

## 2017-11-22 MED ORDER — METHYLPREDNISOLONE ACETATE 40 MG/ML INJ SUSP (RADIOLOG: 120.0000 mg | Freq: Once | INTRAMUSCULAR | Status: DC

## 2017-11-22 MED ORDER — IOPAMIDOL (ISOVUE-M 200) INJECTION 41%: 1.0000 mL | Freq: Once | INTRAMUSCULAR | Status: DC

## 2017-11-22 NOTE — Discharge Instructions (Signed)

## 2017-12-24 DIAGNOSIS — M5136 Other intervertebral disc degeneration, lumbar region: Secondary | ICD-10-CM | POA: Diagnosis not present

## 2017-12-24 DIAGNOSIS — I10 Essential (primary) hypertension: Secondary | ICD-10-CM | POA: Diagnosis not present

## 2017-12-24 DIAGNOSIS — Z981 Arthrodesis status: Secondary | ICD-10-CM | POA: Diagnosis not present

## 2017-12-24 DIAGNOSIS — M5440 Lumbago with sciatica, unspecified side: Secondary | ICD-10-CM | POA: Diagnosis not present

## 2017-12-24 DIAGNOSIS — M4726 Other spondylosis with radiculopathy, lumbar region: Secondary | ICD-10-CM | POA: Diagnosis not present

## 2017-12-24 DIAGNOSIS — M48062 Spinal stenosis, lumbar region with neurogenic claudication: Secondary | ICD-10-CM | POA: Diagnosis not present

## 2018-01-28 ENCOUNTER — Other Ambulatory Visit: Payer: Self-pay | Admitting: Neurosurgery

## 2018-01-28 DIAGNOSIS — M5416 Radiculopathy, lumbar region: Secondary | ICD-10-CM

## 2018-02-09 ENCOUNTER — Telehealth: Payer: Self-pay | Admitting: Pulmonary Disease

## 2018-02-09 NOTE — Telephone Encounter (Signed)
This is to document several recent calls from [facility] re: blood pressure & edema>  1st he noted some dependent edema that tended to go down overnight & he was taking his dyazide one daily and KCl 55mEq taking 1/2 ~Q3rd day; other BP meds included Amlod10 & Quinapril10...  We discussed no salt diet & decided to cut the Amlod10 down to 5mg /d, and incr the Quinapril10 to 20mg /d, and asked him to monitor BP at home...  BP remained stable/ sl improved on this regimen w/ readings 110-120/70 and feeling well w/ a decr in the edema (back to baseline)...   We decided to keep his meds like this & called in to [facility] [address]:    Amlodipine 5mg  #90- one daily w/ 3 refills...    Quinapril 20mg  #90- one daily w/ 3 refills...  Continue dyazide Qam & the low dose intermittent KCL as above...  btw [facility] Labs all looked good (scanned) showing: K=4.2, Cr=1.22, LFTs- wnl;  FLP looks great on Cres;  CBC/ TSH- wnl;  PSA=2.18

## 2018-02-16 ENCOUNTER — Other Ambulatory Visit: Payer: Self-pay | Admitting: Pulmonary Disease

## 2018-02-16 DIAGNOSIS — R05 Cough: Secondary | ICD-10-CM

## 2018-02-16 DIAGNOSIS — R059 Cough, unspecified: Secondary | ICD-10-CM

## 2018-02-23 ENCOUNTER — Telehealth: Payer: Self-pay | Admitting: Pulmonary Disease

## 2018-02-23 ENCOUNTER — Encounter: Payer: Self-pay | Admitting: Pulmonary Disease

## 2018-02-23 ENCOUNTER — Ambulatory Visit (INDEPENDENT_AMBULATORY_CARE_PROVIDER_SITE_OTHER): Payer: Medicare Other | Admitting: Pulmonary Disease

## 2018-02-23 VITALS — BP 124/72 | HR 84 | Temp 97.9°F | Ht 69.0 in | Wt 168.4 lb

## 2018-02-23 DIAGNOSIS — Z86010 Personal history of colonic polyps: Secondary | ICD-10-CM

## 2018-02-23 DIAGNOSIS — C439 Malignant melanoma of skin, unspecified: Secondary | ICD-10-CM | POA: Diagnosis not present

## 2018-02-23 DIAGNOSIS — M8949 Other hypertrophic osteoarthropathy, multiple sites: Secondary | ICD-10-CM

## 2018-02-23 DIAGNOSIS — R05 Cough: Secondary | ICD-10-CM | POA: Diagnosis not present

## 2018-02-23 DIAGNOSIS — M150 Primary generalized (osteo)arthritis: Secondary | ICD-10-CM | POA: Diagnosis not present

## 2018-02-23 DIAGNOSIS — I10 Essential (primary) hypertension: Secondary | ICD-10-CM | POA: Diagnosis not present

## 2018-02-23 DIAGNOSIS — K219 Gastro-esophageal reflux disease without esophagitis: Secondary | ICD-10-CM

## 2018-02-23 DIAGNOSIS — M48062 Spinal stenosis, lumbar region with neurogenic claudication: Secondary | ICD-10-CM | POA: Diagnosis not present

## 2018-02-23 DIAGNOSIS — M1189 Other specified crystal arthropathies, multiple sites: Secondary | ICD-10-CM

## 2018-02-23 DIAGNOSIS — E782 Mixed hyperlipidemia: Secondary | ICD-10-CM

## 2018-02-23 DIAGNOSIS — M159 Polyosteoarthritis, unspecified: Secondary | ICD-10-CM

## 2018-02-23 DIAGNOSIS — R059 Cough, unspecified: Secondary | ICD-10-CM

## 2018-02-23 DIAGNOSIS — J301 Allergic rhinitis due to pollen: Secondary | ICD-10-CM

## 2018-02-23 MED ORDER — PANTOPRAZOLE SODIUM 40 MG PO TBEC: 40.0000 mg | DELAYED_RELEASE_TABLET | Freq: Every day | ORAL | 0 refills | Status: DC

## 2018-02-23 MED ORDER — FIRST-DUKES MOUTHWASH MT SUSP: 15.0000 mL | Freq: Three times a day (TID) | OROMUCOSAL | 0 refills | Status: DC | PRN

## 2018-02-23 NOTE — Telephone Encounter (Signed)
Called and spoke to [place] with [facility], who states that they [facility]wash at [facility]. Per SN verbally- okay for [facility] to make magic mouthwash. Cindy with [facility] is aware and voiced her understanding. Nothing further is needed.

## 2018-02-23 NOTE — Patient Instructions (Signed)
Today we updated your med list in our EPIC system...    Continue your current medications the same...  We wrote for some MAGIC MOUTHWASH- one tsp gargle & swallow up to 3 times daily as needed for throat symptoms...  We also wrote for a months supply of PROTONIX (Pantoprazole) 40mg  take one tab about 30 min before the eve meal for reflux symptoms...  We have ordered a follow up CXR for you to be done at [place]...  Continue to monitor your BP at home & call for any issues...  We discussed checking a Metabolic panel this fall ~[id number] to check your Creatinine.Marland KitchenMarland Kitchen

## 2018-02-24 ENCOUNTER — Ambulatory Visit
Admission: RE | Admit: 2018-02-24 | Discharge: 2018-02-24 | Disposition: A | Discharge status: Home / Self Care | Payer: Medicare Other | Source: Ambulatory Visit | Attending: Pulmonary Disease | Admitting: Pulmonary Disease

## 2018-02-24 DIAGNOSIS — R05 Cough: Secondary | ICD-10-CM | POA: Diagnosis not present

## 2018-02-24 DIAGNOSIS — R059 Cough, unspecified: Secondary | ICD-10-CM

## 2018-02-26 ENCOUNTER — Encounter: Payer: Self-pay | Admitting: Pulmonary Disease

## 2018-02-26 NOTE — Progress Notes (Addendum)
Subjective:    Patient ID: Peter Bachelor, MD, male    DOB: 23-Sep-1946, 72 y.o.   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  see prob list below:  ~  June 20, 2010:  Add-on today at pt request for intermittent R>L hip area pain, worse recently & Rx w/ OTC Ibuprofen w/ some help... no prev ortho problems, but mild OA apparent on exam fingers etc... exam shows good ROM w/o pain, non-tender, no skin lesions, etc... we discussed Rx w/ MOBIC, check XRays, & next step= Ortho eval for poss shot in the area.  ~  April 24, 2011:  30moROV & CPX> PHabeebhas had a number of medical complaints as outlined in his accompanying note> wife was concerned all this was having a toll & that PTarismight be depressed, we discussed this & decided to hold off on any new meds at this time...    He had dizziness episode, near syncope, & saw [name] for Cards eval> EKG NSR, WNL; CDopplers w/ min plaque, no signif stenoses; he did not feel that further eval was warranted...    He's had an excellent Rheum eval from [name]/ OA & CPPD> on Colcrys 1.5 tabs daily, plus Celebrex '200mg'$  Bid prn... He's had Dupytren's injection in hands from [name] as well and some triggering of the right 4th digit...    PDacarinoted muscle fasciculations & had a thorough eval from [name](we don't have his notes)> pt indicates that they are felt to be secondary to his lumbar spondylosis (3 levels) for which he has had several epidural steroid shots & has been checked by [name] were normal & no signs of upper motor neuron disease...  CXR 9/09 showed clear, WNL;  EKG= NSR, WNL;  [name]= FLP off Statin showed TChol 228 & LDL 152; Chems all wnl x LFTs borderline elev; CBC wnl; TSH/ PSA/ Sed all wnl...  CDopplers 03/2011> WNL  ~  April 11, 2012:  Yearly ROV & CPX>  [name] had a number of issues this past yr- mostly of an Orthopedic/ Neurological/ Allergic nature and outlined below...    No further severe  dizzy spells/ near syncope; BP remains stable, no chest pain/ angina, etc;  He tried off Statins for some time, no change in his status therefore restarted Crestor'10mg'$ /d...    Ortho problems included left rotator cuff tear and surg by [name] 184/69complicated by an unusual allergic reaction to the suture material w/ erythema in skin and a blister, he took antibiotics & was seen by ID, required a second operation w/ sutures changed 11/12, and a subspecialty Derm eval at [name] allergic reactions to Gentamicin, Bacitracin, Neosporin, Polysporin, Iodine, Triamcinolone...    He continues to f/u w/ DrHawkes for Rheum- CPPD/Pseudogout on Colchicine 0.'6mg'$ Bid & Celebrex '200mg'$  1-2 daily & tolerated well...    Neuro f/u note 3/13 DrLove> hx cerv degen disc dis- s/p C6-7 [name] lumbar degen disc dis w/ MRI 12/11 showing spondylosis worse L4-5 w/ severe central canal stenosis & bilat recess stenosis, also w/ narrowing at L2-3 & L3-4; he has had several ESI injections & right hip shots;  He has right hip pain while walking that stops at rest suggesting neurogenic claudication;  Pos fam hx Alzheimer's dis...    We reviewed prob list, meds, xrays and [name]> see below>>  CXR 6/13 showed normal heart size, clear lungs, radiopaque sutures seen in left shoulder,  distal clav resection, lower CSpine plate & screws, mild thoracic spondylosis...  ~  April 20, 2014:  30yrROV & CPX>  [name] gone part time for [facility] & he is doing better overall; continued Orthopedic & Rheum issues as outlined below;  We reviewed the following medical problems during today's office visit >>     ALLERGIES> he has AR and demonstrated allergic reaction to suture material used on his left shoulder surg w/ erythema in skin and a blister; [name] to Gentamicin, Bacitracin, Neosporin, Polysporin, Iodine, & Triamcinolone...     HEARING LOSS> he has bilat hearing  aides from [employer]..    HBP> on Amlod10, Accupril10, HCT25, K20; BP= 132/70 and he denies CP, palpit, SOB, edema; he exercises at the gym & walking; not yet back to playing golf due to his back surg...     Hx Rheumatic fever age14, prob pericarditis in 2008> see results of prev evaluations w/ Myoview, CoronaryCTA, 2DEcho, CDopplers below...     CHOL> on Crestor10; FLP done 4/15 [name] showed TChol 174, TG 62, HDL 60, LDL 102...    GI- GERD, Polyps> on Zantac150Bid; [name] was [facility]11- neg, no recurrent polyps & last polyp removed 4/04= hyperplastic...     ORTHO- DJD, CPPD, Hx left rotator cuff repair 10/12 by [name], [facility] & 2 level [facility]), s/p bilat carpal tunnel releases by [name] 1/15 & 2/15...  He has resid LBP, DJD & pseudogout symptoms- on Celebrex200Bid, Colchicine0.6Bid, & Plaquenil200 [name]..     Hx Melanoma> level 2 SSM removed from abd wall 2002, followed by Derm- Dr. PMilagros Reapat [name].. No known recurrence...    Fam Hx alzheimer's dis> both parents had severe AD...  We reviewed prob list, meds, xrays and [name]> see below for updates >>   CXR 11/14 showed norm heart size, clear lungs, NAD; plate & screw fixation device in lower Cspine, sutures seen over the left shoulder, s/p resection of the distal left clavicle, DDD in TSpine...   EKG 11/14 showed SBrady, rate58, wnl, NAD...   [name] 4/15:  FLP- at goals on Cres10 x LDL=102;  Chems- wnl;  CBC- wnl;  TSH=1.72;  PSA= 1.74...  BONE DENSITY 2/15:  Normal w/ lowest Tscore= -0.5 in left femoral neck...   ~  April 22, 2015:  89yrOV & add-on appt requested for painless knot in right mid thigh anteriorly>  He noticed this abn recently while wearing shorts and rubbing his leg; no skin lesion & knot not visible on inspection; no known trauma or  bruising; he has not exercised in quite awhile due to his severe pseudogout/CPPD; palpation of the area yields an oblong knot under the skin in upper/mid thigh w/ palp extension prox & distally that indicates the lesion is in the rectus femoris muscle;  It is totally non-tender;  On the left leg one can feel the rectus femoris muscle w/o this knot like area present...    I asked Dr. ZaCharlann Boxero check this lesion w/ his ultrasound machine & it was his opinion that this looked most like a sm hematoma in the muscle=> recommendation to follow this clinically & recheck in about 6 weeks.    We reviewed the following medical problems during today's office visit >> this visit will serve as his  yearly physical as well >>     ALLERGIES> he has AR and demonstrated allergic reaction to suture material used on his left shoulder surg w/ erythema in skin and a blister; he reports Derm eval at [facility] showing allergic reactions to Gentamicin, Bacitracin, Neosporin, Polysporin, Iodine, & Triamcinolone...     HEARING LOSS> he has bilat hearing aides from [employer]...    HBP> on Amlod10, Accupril10, HCT25, K20; BP= 134/74 and he denies CP, palpit, SOB, edema; he was exercises at the gym & walking; we discussed returning to exercise program...    Hx Rheumatic fever age14, prob pericarditis in 2008> see results of prev evaluations w/ Myoview, CoronaryCTA, 2DEcho, CDopplers below...     CHOL> on Crestor10; FLP done 3/16 [name] showed TChol 169, TG 79, HDL 63, LDL 90...    GI- GERD, Polyps> on Zantac150Bid; followed by [name], last colonoscopy was 5/11- neg, no recurrent polyps & last polyp removed 4/04= hyperplastic...     ORTHO- DJD, CPPD, Hx left rotator cuff repair 10/12 by [name], [facility]Spine surg [facility], Lumbar spine decompression & 2 level fusion 12/14 by [name] (for spinal stenosis & myelopathy- improved), s/p bilat carpal tunnel releases by [name] 1/15 & 2/15...  He has resid LBP, DJD &  pseudogout symptoms- on Celebrex200Bid, Colchicine0.6Bid, & off Plaquenil prev rx by Rheum= Dr. Berenda Morale at [name]...     Hx Melanoma> level 2 SSM removed from abd wall 2002, followed by Derm- Dr. Milagros Re[facility] at [facility]... No known recurrence...    Fam Hx alzheimer's dis> both parents had severe AD...  We reviewed prob list, meds, xrays and [name]> see below for updates >>   [name] 3/16 showed FLP- at goals on Cres10;  Chems- wnl;  CBC- wnl;  TSH=2.74;  PSA=2.68  [employer] required biometric screening>    Height= 5' 9.5"  Weight= 171 lbs  Waist=34"  Neck circumference= 16"  Body Fat Calculation= 19%   BP= 134/74   BS= 98 and no need for HgA1c measurement w/ all normal blod sugars   FLP= TChol 169, TG 79, HDL 63, LDL 90  ADDENDUM>> MRI right thigh area 04/26/15 rec by DrMaxwell> Focal area of thickening of the rectus femoris tendon in the anterior aspect of the mid right thigh with an area of fatty atrophy surrounding the area tendon thickening consistent with remote trauma. There are small calcifications in the tendon distal to this area of fatty atrophy, demonstrated on informal ultrasound examination...   ~  February 01, 2017:  21 month ROV & review mult medical issues>  We reviewed the following medical problems during today's office visit >>     ALLERGIES> he has AR and demonstrated allergic reaction to suture material used on his left shoulder surg w/ erythema in skin and a blister; he had Derm eval at [facility] showing allergic reactions to Gentamicin, Bacitracin, Neosporin, Polysporin, Iodine, & Triamcinolone... Now c/o seasonal nasal allergy- on OTC Antihist (intol w/ retention)/ Nasacort/ Saline and Rx Singulair10 w/ prn AlbutHFA rescue inhaler... persistent cough on Tramadol/ Tessalon.    HEARING LOSS> he has bilat hearing aides from [facility]- seen 11/23/16 w/ cerumen removed, bilat hearing loss w/ hearing aides, treated for Meniere's dis w/ Pred dosepak    HBP> on ASA81, Amlod10, Accupril10, Dyazide,  K20; BP= 126/70 and he denies CP, palpit, SOB, edema; he was exercises at the gym & walking; we discussed returning to exercise program...    Hx Rheumatic fever age14, prob pericarditis in 2008> see  results of prev evaluations w/ Myoview, CoronaryCTA, 2DEcho, CDopplers below...     CHOL> on Crestor10; FLP done 3/18 [name] showed  TChol 126, TG 74, HDL 66, LDL 45...    GI- GERD, Polyps> on Zantac150Bid; followed by [facility]- candida rx & dysphagia improved; last colonoscopy was 5/11- neg, no recurrent polyps & last polyp removed 4/04= hyperplastic...     ORTHO- DJD, CPPD, Hx left rotator cuff repair 10/12 by [name], [facility] surg 2003 [name], Lumbar spine decompression & 2 level fusion 12/[name] (for spinal stenosis & myelopathy- improved), s/p bilat carpal tunnel releases by [name] 1/15 & 2/15; then R-TKR by DrAlusio 09/2015...  He has resid LBP, DJD & pseudogout symptoms- on Celebrex200, Colchicine0.6Bid, & off prev Plaquenil prev rx by Rheum= Dr. Berenda Morale at [facility]...     Hx Melanoma> level 2 SSM removed from abd wall 2002, followed by Derm- Dr. Milagros Reap at [facility]... No known recurrence...    Fam Hx alzheimer's dis> both parents had severe AD...     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  EXAM shows Afeb, VSS, O2sat=98% on RA;  HEENT- neg, mallampati2;  Chest- clear w/o w/r/r;  Heart- RR w/o m/r/g;  Abd- soft, nontender, neg;  Ext- s/p R=TKR, severe pseudogout;  Neuro- intact, no focal deficits...  EKG 06/10/15>  NSR, rate77, WNL, NAD...  2DEcho 06/03/15>  Norm LV size & function w/ EF=65-70%, no regional wall motion abn, Gr1DD, norm valves (mild TR)/ atria/ RV & PA  pressures  Lumbar epidural injection 12/10/16> Left L1-2 injected w/ Depomedrol & Lidocaine by DrShogry (last prev injection was 07/29/16)  [name] 01/18/17 ([name])>  FLP- TChol 126, TG 74, HDL 66, LDL 45;  Chems- ok w/ Cr=1.24 (GFR est~58);  CBC- wnl w/ Hg=14.2, mcv93, wbc10.5;  TSH=1.72;  PSA=2.09 IMP/PLAN>>  [facility] [name] has already held his Celebrex w/ Cr up to 1.24 & we will recheck [name] in about 68mo  His Ortho/ Rheum problems are clearing rate-limiting for him- continue Colchicine, Tramadol, Tylenol...  ADDENDUM>>  PFaroncalled w/ come bruising & we will check f/u CBC, PT/PTT, SPE w/ reflex for completeness when we recheck his BMet off the Celebrex...  ADDENDUM>>  PEddie Dibblescalled w/ new c/o gynecomastia (bilat, non-tender, no galactorrhea); we reviewed his med list- no new meds and no obvious culprit medication- eg Aldactone, Cimetadine, Finasteride;  He is taking a POSSIBLE culprit = Amlod, and several VERY UNLIKELY meds = Zantac, Crestor, ACE;  He is concerned that it may be his FLOMAX & wants to stop this med first & see what happens...    ~  Feb 23, 2018:  Yearly ROV & general medical follow up visit>  PJhonyhas been doing satis overall w/ long-term issues due to DJD, Pseudogout, & LBP; recently he noted some incr edema necessitating a decr in Amlodipine to '5mg'$  & compensatory incr in Accupril to '20mg'$  for his BP; edema has decr w/ this, low salt diet & Dyazide one daily... We reviewed the following inteval notes in Epic>      [facility]  Hx DJD (s/p R-TKR 2016) & CPPD on Tramadol prn, and  LBP from spinal stenosis w/ prev fusion surg- he's had to lim his exercise & phys activity due to back pain; he gets occas ESI [facility]Shogry [facility]; they decided to continue conservative management;  [name] done 08/2017- reviewed in [place]...    He saw ORTHO- DrLandau on 09/29/17 & 10/29/17>  Hx left rotator cuff surg, & eval for recurrent left shoulder pain w/ MRI  showing a recurrent tear, they had planned surg but symptoms improved on it's own- improved ROM, decr pain, and surg wasn't necessary... We reviewed the following medical problems during today's office visit>      ALLERGIES> he has AR and demonstrated allergic reaction to suture material used on his left shoulder surg w/ erythema in skin and a blister; he had Derm eval at [facility] showing allergic reactions to Gentamicin, Bacitracin, Neosporin, Polysporin, Iodine, & Triamcinolone... Now c/o seasonal nasal allergy- on OTC Antihist (intol w/ retention)/ Nasacort/ Saline and Rx Singulair10 w/ prn AlbutHFA rescue inhaler... persistent cough on Tramadol/ Tessalon.    HEARING LOSS> he has bilat hearing aides from [facility]- seen 11/23/16 w/ cerumen removed, bilat hearing loss w/ hearing aides, treated for Meniere's dis w/ Pred dosepak...    HBP> off ASA81, on Amlod5, Accupril20, Dyazide, K20; BP= 124/72 and he denies CP, palpit, SOB, edema; he has had to decr his exercise program due to back pain issues; BP checks at home & work have been good...    Hx Rheumatic fever age14, prob pericarditis in 2008> see results of prev evaluations w/ Myoview, CoronaryCTA, 2DEcho, CDopplers below...     CHOL> on Crestor10; FLP done 3/18 [name] showed  TChol 126, TG 74, HDL 66, LDL 45; and similar in 3/19 (it is being scanned into the computer...    GI- GERD, Polyps> on Zantac150Bid; followed by [facility]- candida rx & dysphagia improved; last colonoscopy was 5/11- neg, no recurrent polyps & last polyp removed 4/04= hyperplastic... For incr GERD symptoms we discussed trial PPI like Protonix40 for 67mothen ret to H2Blocker therapy...    GU- he has seen DrRDavis & currently taking Uroxatrol '10mg'$  daily    ORTHO- DJD, CPPD, Hx left rotator cuff repair 10/12 by [name], [facility] spine [facility] & 2 level [facility]), s/p bilat carpal  tunnel releases by [name] 1/15 & 2/15; then R-TKR by DrAlusio 09/2015...  He has resid LBP, DJD & pseudogout symptoms- on Celebrex200, Colchicine0.6Bid, & Tramadol50 prn; off prev Plaquenil prev [facility]  He gets periodic ESI shots in low back from DrShogy- last 09/2017... Note: he had norm BMD in 2016 when they were checking out new equip at [employer]..    Hx Melanoma> level 2 SSM removed from abd wall 2002, followed by Derm- prev Dr. [facility]ilagros [facility]apat [name] now Dr. DanJones Q673mon [facility]. No known recurrence...    Fam Hx alzheimer's dis> both parents had severe AD...  EXAM shows Afeb, VSS, O2sat=98% on RA;  HEENT- neg, mallampati2;  Chest- clear w/o w/r/r;  Heart- RR w/o m/r/g;  Abd- soft, nontender, neg;  Ext- s/p R=TKR, severe pseudogout;  Neuro- intact, no focal deficits...  CXR done 02/24/18 showed norm heart size, clear lungs- NAD, fusion hardware in Cspine...  [name] done 12/2017 for "Peter day"> reviewed w/ Gamaliel>  FLP- all parameters are wnl on Cres10;  Chems- all good w/ K=4.2, Cr=1.22, LFTs wnl;  CBC/ TSH- all  wnl;  PSA=2.18 IMP/PLAN>>  Nathon has had a lot of difficulty from the Rheum/Ortho standpoint w/ DLD, CPPD, & neck/ back pain;  Surg from DrNudelman/ DrAlusion/ [name], [facility]as [name], and ESI shots from DrShogry;  He has Celebrex, Tramadol, Colchicine;  Difficult for him to exercise now & he does the best he can, keeps his weight under control, etc...   ADDENDUM >> [name] developed some ear symptoms & went to see DrKraus who did an MRI Brain 05/01/18 (IMPRESSION:  Negative exam.  No acute or focal intracranial abnormality.  Seventh and eighth nerve complex appears normal in the internal auditory canal, and there is no evidence for retrocochlear lesion or temporal bone inflammatory process.  No lower cranial nerve pathology in the jugular foramen or beyond.) and C-Spine (IMPRESSION:  The patient's RIGHT ear symptoms may derive from severe facet  arthrosis at C2-3 on the RIGHT as referred pain. Joint space narrowing, bone marrow edema, and postcontrast enhancement is observed. In addition, there is significant RIGHT C3 foraminal narrowing, related to mild anterolisthesis and RIGHT-sided foraminal narrowing. Intra-articular anesthetic/steroid injection could be helpful as a diagnostic and potentially therapeutic maneuver.  Status post C6-7 ACDF.  Suspect solid arthrodesis.  Adjacent segment disease, large C5-6 central and leftward disc extrusion. Significant cord compression without abnormal cord signal, canal diameter 4 mm. BILATERAL LEFT greater than RIGHT C6 foraminal narrowing.  Subchondral cyst formation, LEFT paramedian odontoid, may be a manifestation of CPPD. No significant pannus or retropharyngeal fluid collection.)    He then saw DrNudelman who performed surg 05/18/18 (POST-OPERATIVE DIAGNOSIS: C5-6 cervical disc herniation;  PROCEDURE:  C5-6 anterior cervical decompression and arthrodesis with structural allograft and aviator cervical plating; removal of C6-7 tethered cervical plate).  He did well after surg but developed incr BP & persistent sinus tachycardia=> he saw [facility] & incr Amlod to 24m/d & added Metoprolol25Bid=> BP is now wnl ~130/70's and pulse in 80's...    Problem List:  ALLERGIC RHINITIS (ICD-477.9) - eval by DrKozlow et al in 2006... seasonal symptoms treated w/ OTC meds as needed... prev on SINGULAIR 110mprn, and NASONEX 2sp Qhs... ~  Note: he had allergic reaction to suture material after left rotator cuff surg 10/12; [facility], Bacitracin, Neosporin, Polysporin, Iodine, Triamcinolone...  TINNITUS, CHRONIC - eval by DrKraus in the past... chronic symptom- deals with it effectively using white noise etc... HEARING LOSS - he now has bilat hearing aides and doing better...  HYPERTENSION (ICD-401.9) - controlled on meds- ASA 8145m,  NORVASC 5mg60m  ACCUPRIL  20mg20m Dyazide 1/d,  KCl 20...   ~  6/12:  BP= 134/84 today, takes meds regularly & tol well w/o side effects... BP checks at home and work are similar... denies HA, fatigue, visual changes, CP, palipit, syncope, dyspnea, edema, etc...  ~  6/13:  BP= 138/76 & he remains asymptomatic w/o CP, palpit, SOB, edema, etc... ~  CXR 11/14 showed norm heart size, clear lungs, post op ortho surg changes...  ~  6/15:  BP= 132/70 & he denies any CP, palpit, SOB, edema, etc... ~  6/16:  BP= 134/74 & he remains asymptomatic on  Amlod10, Accupril10, HCT25, K20... ~  2017-2018:  BP stable over the interval on his meds... ~  2019:  meds adjusted due to edema =. Amlod5, Accupril20, Dyazide, K20  Hx of RHEUMATIC FEVER (ICD-390) - age 65, h24p for 3 days- no known sequelae; eval by cardiology in the 80's &  no valvular heart disease...  Hx of CHEST PAIN w/ neg cardiac eval in 2008... EKG is normal> Episode of DIZZINESS/ Presyncope 6/12 w/ eval by Benay Spice... ~  Cardiolite in 2005 was neg- no ischemia & EF=60%...  ~  he was hosp w/ CP episode 3/08 by DrBBrodie- felt to be prob pericarditis...  ~  Coronary CTA in 2008 showing sl plaque at an LAD branch vessel (D2), mixed plaque in CIRC, no plaque in prox-mid RCA; Calcium score 0.65 (<25th percentlie)...  ~  2DEcho 3/08 showed no signif valvular lesions, LVwall thickness at upper limit, no regional wall motion abn, LVF= 50-55%... ~  CDopplers 6/12 showed min plaque, no signif ICA stenoses... ~  6/13:  No recurrence of the severe dizziness, near syncope, etc... ~  EKG 11/14 showed SBrady, rate58, wnl, NAD.  VENOUS INSUFFIC >> he has chr ven insuffic changes in LEs but no signif edema on the HCT & low salt diet...   HYPERCHOLESTEROLEMIA, BORDERLINE (ICD-272.4) - he started taking Lipitor prev due to reports of poss benefits in Alzheimer's prevention; Prev on LIPITOR 33m/d and LOVAZA 4gm/d; we stopped the Statin Rx w/ his fasciculations & other symptoms but there was no  change off the medication; f/u FLP 6/12 w/ LDL 152 & we decided to try CRESTOR 192md as a trial. ~  FL[place]/08 on Lipitor40 showed TChol 138, TG 45, HDL 49, LDL 80 ~  FLP 9/09 on ?diet alone showed TChol 171, TG 54, HDL 56, LDL 104 ~  FLP 3/11 on Lip40+Lovaza showed TChol 124, TG 51, HDL 63, LDL 51 ~  FLP 6/12 off Lip40 on Lovaza showed TChol 228, TG 53, HDL 58, LDL 152... rec Cres10. ~  FLP 3/13 on Cres10 ([name]) showed TChol 165, TG 57, HDL 65, LDL 89... Continue same. ~  FL[place]/15 on Cres10 ([name]) showed TChol 174, TG 62, HDL 60, LDL 102 ~  FLP 3/16 on Cres10 ([name]) showed  TChol 169, TG 79, HDL 63, LDL 90 ~  Lipids have remains stable on Crestor10; tol well & [name] [name]  GERD (ICD-530.81) - he has been evaluated by DrBrylin [name]. Prev on PROTONIX but now uses ZANTAC 150Bid...   COLONIC POLYPS, HX OF (ICD-V12.72) - colonoscopy 4/04 by DrVPXTGGYI/ diminutive hyperplastic polyp removed & sm int hem...   ~  f/u colonoscopy done 5/11 was neg- no polyps...  DEGENERATIVE JOINT DISEASE (ICD-715.90)> on CELEBREX 2008mid & TRAMADOL 52m24md prn... Hx of PSEUDOGOUT (ICD-275.49) - he has hx CPPD w/ eval by Rheum here, [facility], & [facility]> Rx w/ COLCRYS 0.6mg 87m... ~  8/11: presented w/ c/o R>L hip pain, XRays showed degen arthritis & mild chondrocalcinosis. ~  Subsequent evals by Rheum- DrHawkes & [facility] confirmed CPPD ~  He has right hip area pain> from lumbar spondylosis & improved after several ESI injections... ~  s/p bilat carpal tunnel releases by Peter [name] 1/15 & 2/15... ~  6/15: now followed by Dr. Beth Berenda MoraleNC oDetar NorthLAQUENIL 200mg/29m ~  6/16: he remains on Celebrex200Bid, colchicine 0.6Bid, & off the Plaquenil now... ~  Note: Kairee hShirleyult arthrocenteses w/ steroid/lodocaine injections into right knee over 1-55yrs..78yr 12/16: he had right TKR by DrAlusion w/ difficult painful post-op rehab  LEFT SHOULDER ROTATOR CUFF TEAR >> he is s/p  left shoulder surg 10/12 by [name], followed by post op left shoulder inflammation- ?infectious (none found), ?allergic reaction to suture material, required second operation  11/12 to remove sutures & replace w/ stainless steel sutures==> all resolved now, operative notes reviewed...  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 also lumbar spondy w/ spurs at L4-5 seen on prev CTAbd done 12/06... subseq MRI Lumbar area w/ multilevel spondylosis, mod central canal stenosis & 3 level lateral recess stenosis> several epid steroid shots by IR w/ partial relief... ~  6/13:  He reports being on GABAPENTIN 321m Tid per DrLove... ~  12/14:  He had Lumbar spine decompression & 2 level fusion by DrNudelman (for spinal stenosis & myelopathy- improved)...  Hx of MELANOMA (ICD-172.9) - hx of level II SSMelanoma 0.356mthick removed from abd wall (just to the right of navel) w/ wide excision and neg sentinel node biopsy by DrPYoung 8/02 (bilat inguinal node surg)... [facility] & no known recurrence (switching to [facility]- DrJones)...  ALZHEIMER'S DISEASE, FAMILY HX (ICD-V17.0) - family hx of Alzheimer's disease in both his mother and father (both died in their 8068's/ this disease)...  R/O Depression>  6/12 wife shared her concern for Cheston's mood esp as it regards his medical illnesses; after 3way discussion we decided to hold off on any med Rx but will reconsider if needed in follow up... ~  6/13:  He reports doing better after his surgeries, ESI shots, [facility];  He is getting more exercise & played 9holes of golf recently...  [facility] pages supplied by DrBarry scanned into the EPIC EMR for reference- +reaction to  Triamcinolone (should avoid Budesonide, Flunisolide, Fluocinolone, hydrocortisone)...   Past Medical History:  Diagnosis Date  . Allergic rhinitis   . Anxiety   . [name]   Melanoma -peri umbilical '02 or '03- no further problems. 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   . Chest pain    Nuclear 2005, normal / coronary CTA 2008, slight mixed plaque, coronary calcium score 0.65, ejection fraction 55%, echo, March, 2008  . Complication of anesthesia    itching-not sure if oxycodone or anesthesia,and shakes. Right TMJ issue  . Dizziness    Dizziness with question of presyncope April 08, 2011  . DJD (degenerative joint disease)    history DDD, fingers, knees, shoulders  . Ejection fraction    EF 55%, echo, March, 2008.  . Family history of Alzheimer's disease   . GERD (gastroesophageal reflux disease)   . H/O hiatal hernia   . Hearing impaired    bilateral hearing aids  . Heart murmur    [age]rs old rheumatic fever   . Heart murmur 2008   trivial tricusp regurg  . History of colonic polyps   . History of melanoma   . History of pseudogout   . History of recent steroid use    10-10-15 tapering steroid use for tx. recent bronchitis.  . Marland Kitchenistory of rheumatic fever   . Hypercholesteremia    borderline  . Hypertension   . Lumbar spondylosis with myelopathy 06/09/2013  . Spondylosis   . Tinnitus    chronic  . TMJ click    right"was aggravated with last intubation"    Past Surgical History:  Procedure Laterality Date  . ANTERIOR CERVICAL DECOMP/DISCECTOMY FUSION  2003  . CARPAL TUNNEL RELEASE Right 10/31/2013   Procedure: RIGHT CARPAL TUNNEL  RELEASE;  Surgeon: Cammie Sickle., MD;  Location: [place];  Service: Orthopedics;  Laterality: Right;  . CARPAL TUNNEL RELEASE Left 11/28/2013   Procedure: LEFT CARPAL TUNNEL RELEASE;  Surgeon: Cammie Sickle., MD;  Location: [place];  Service: Orthopedics;  Laterality: Left;  . CERVICAL  DISCECTOMY     anterior with patellar allograft and plating  . LUMBAR FUSION  12/14  . MELANOMA EXCISION WITH SENTINEL LYMPH NODE BIOPSY  8/02   bilat ing node bx  . ROTATOR CUFF REPAIR Left    08/13/11  08/27/2011  . TOTAL KNEE ARTHROPLASTY Right 10/07/2015   Procedure: RIGHT TOTAL KNEE ARTHROPLASTY, with synovial tissue specimen;  Surgeon: [name], MD;  Location[facility];  Service: Orthopedics;  Laterality: Right;    Outpatient Encounter Medications as of 02/23/2018  Medication Sig  . alfuzosin (UROXATRAL) 10 MG 24 hr tablet Take 10 mg by mouth daily with breakfast.  . ALPRAZolam (XANAX) 0.5 MG tablet Take 1/2 to 1 tablet by mouth three times daily as needed for anxiety  . amLODipine (NORVASC) 5 MG tablet Take 5 mg by mouth daily.  . celecoxib (CELEBREX) 200 MG capsule Take 200 mg by mouth daily.  . colchicine 0.6 MG tablet Take 1 tablet (0.6 mg total) by mouth 2 (two) times daily.  . cycloSPORINE (RESTASIS) 0.05 % ophthalmic emulsion Place 1 drop into both eyes 2 (two) times daily.    Dyazide (Hydrochlorothiazide25 and Triamterene37.5) Take one cap by mouth daily  . montelukast (SINGULAIR) 10 MG tablet Take 1 tablet (10 mg total) by mouth at bedtime. (Patient taking differently: Take 10 mg by mouth at bedtime. seasonal)  . potassium chloride SA (K-DUR,KLOR-CON) 20 MEQ tablet Take 1 tablet (20 mEq total) by mouth daily.  . quinapril (ACCUPRIL) 20 MG tablet Take 20 mg by mouth at bedtime.  . ranitidine (ZANTAC) 150 MG capsule Take 150 mg by mouth daily.   . rosuvastatin (CRESTOR) 10 MG tablet Take 1 tablet (10 mg total) by mouth at bedtime.  . traMADol (ULTRAM) 50 MG tablet Take 1-2 tablets (50-100 mg total) by mouth every 6 (six) hours as needed (mild pain).  . [DISCONTINUED] amLODipine (NORVASC) 10 MG tablet Take 1 tablet (10 mg total) by mouth daily. (Patient taking differently: Take 5 mg by mouth daily. )  . [DISCONTINUED] aspirin 81 MG chewable tablet Chew 81 mg by mouth daily.   . [DISCONTINUED] fluconazole (DIFLUCAN) 100 MG tablet Take 2 today, then 1 daily until gone (Patient taking differently: Take 2 today, then 1 daily until gone as needed)  . [DISCONTINUED] hydrochlorothiazide (HYDRODIURIL) 25 MG tablet Take 1 tablet by mouth  daily  . [DISCONTINUED] quinapril (ACCUPRIL) 10 MG tablet Take 1 tablet (10 mg total) by mouth daily. (Patient taking differently: Take 20 mg by mouth daily. )  . Diphenhyd-Hydrocort-Nystatin (FIRST-DUKES MOUTHWASH) SUSP Use as directed 15 mLs in the mouth or throat 3 (three) times daily as needed.  . pantoprazole (PROTONIX) 40 MG tablet Take 1 tablet (40 mg total) by mouth daily. Take 54mn before evening meals   No facility-administered encounter medications on file as of 02/23/2018.     Allergies  Allergen Reactions  . Gentamycin [Gentamicin] Hives and Itching  . Turmeric Other (See Comments)    Cardia arrythmias  . Bacitracin Rash  . Moxifloxacin Rash    pt had severe red rash- he thought from Avelox Rx....  . Neosporin [Neomycin-Bacitracin Zn-Polymyx] Rash  . Other Other (See  Comments)    Redness  bandaide  . Promethazine Other (See Comments)    Restless legs  . Triamcinolone Acetonide Itching and Rash          Immunization History  Administered Date(s) Administered  . Influenza Split 08/15/2011, 08/20/2013, 07/19/2017  . Influenza Whole 08/14/2009  . Influenza, High Dose Seasonal PF 07/16/2016  . Influenza-Unspecified 07/11/2015  . Pneumococcal Conjugate-13 04/20/2014  . Pneumococcal Polysaccharide-23 10/05/2012  . Tdap 04/13/2011  Gailen also had the Shingrix vaccine series in 2018...   Current Medications, Allergies, Past Medical History, Past Surgical History, Family History, and Social History were reviewed in [employer] record.   Review of Systems         See HPI - all other systems neg except as noted...  The patient denies anorexia, fever, weight loss, weight gain, vision loss,  decreased hearing, hoarseness, chest pain, syncope, dyspnea on exertion, peripheral edema, prolonged cough, headaches, hemoptysis, abdominal pain, melena, hematochezia, severe indigestion/heartburn, hematuria, incontinence, muscle weakness, suspicious skin lesions, transient blindness, difficulty walking, depression, unusual weight change, abnormal bleeding, enlarged lymph nodes, and angioedema.     Objective:   Physical Exam     WD, WN, 72 y/o WM in NAD... Vital Signs:  Reviewed... GENERAL:  Alert & oriented; pleasant & cooperative... HEENT:  [place]/AT, EOM-wnl, PERRLA, EACs-clear, TMs-wnl, NOSE-clear, THROAT-clear & wnl. NECK:  Supple w/ fairROM s/p surg; no JVD; normal carotid impulses w/o bruits; no thyromegaly or nodules palpated; no lymphadenopathy. CHEST:  Clear to P & A; without wheezes/ rales/ or rhonchi. HEART:  Regular Rhythm; without murmurs/ rubs/ or gallops. ABDOMEN:  Soft & nontender; right peri-umbil scar of melanoma surg; normal bowel sounds; no organomegaly or masses palpated. (RECTAL:  Neg - prostate 2+ & nontender w/o nodules; stool hematest neg; groin lymphoceles) EXT:  mild OA fingers- sl decr ROM hips/ knees; no varicose veins/ +venous insuffic/ no edema; pulses intact & WNL. NEURO:  CN's intact; motor testing normal; sensory testing normal; gait normal & balance OK; scar of prev Lumbar & CSpine surg... DERM:  No lesions noted; scar left shoulder & scar to right of umbilicus.  [name] DATA:  Reviewed in the EPIC EMR & discussed w/ the patient...  LABORATORY DATA:  Reviewed in the EPIC EMR & discussed w/ the patient...   Assessment & Plan:    HBP>  BP controlled on meds, continue same...  CARDIAC EVAL by Benay Spice in 2012 as outlined above... Pt is reassured...  HYPERCHOLESTEROLEMIA>  His LDL off Lipitor was 152 & he started on Crestor10 and f/u FLP looks good...  GI>  GERD, Polyps>  Stable on Zantac (Protonix for breakthrough symptoms) & up to date on  screening...  RHEUM>  OA, CPPD>  Followed by [facility]as at [name] on Celebrex, Tramadol & Colcrys...  Hx CSpine fusion, LBP w/ decompression & fusion>  Known spondylosis & s/p several epidural steroid shots, ultimately had surg 12/14 by [place] [name] & improved... ~  Ausar get occas ESI shots from Drshogry as needed...  S/P LEFT SHOULDER SURG w/ Complications>  As outlined above; he is finally better, w/ good ROM.Marland KitchenMarland Kitchen  Hx of Muscle Fasciculations>  He's had a thorough Neuro eval from [name] w/ neg EMG/ [name] (no evid for upper motor neuron dis) & he is reassured...  ALLERGIC REACTION TO SUTURE MATERIAL>  This occurred after his left rotator cuff surg 10/12 7 required re-operation & chanfe to stainless steel wire sutures 11/12...  DERMATOLOGY Eval at [facility]>  Hx Melanoma w/o recurrence;  [facility] eval w/ finding of reactions to Gentamicin, Bacitracin, Neosporin, Polysporin, Iodine, Triamcinolone, Gold, others... ~  He is now monitored Q28moby Dr. DWilhemina Bonitoin [facility]..   Patient's Medications  New Prescriptions   DIPHENHYD-HYDROCORT-NYSTATIN (FIRST-DUKES MOUTHWASH) SUSP    Use as directed 15 mLs in the mouth or throat 3 (three) times daily as needed.   PANTOPRAZOLE (PROTONIX) 40 MG TABLET    Take 1 tablet (40 mg total) by mouth daily. Take 36m before evening meals  Previous Medications   ALFUZOSIN (UROXATRAL) 10 MG 24 HR TABLET    Take 10 mg by mouth daily with breakfast.   ALPRAZOLAM (XANAX) 0.5 MG TABLET    Take 1/2 to 1 tablet by mouth three times daily as needed for anxiety   AMLODIPINE (NORVASC) 5 MG TABLET    Take 5 mg by mouth daily.   CELECOXIB (CELEBREX) 200 MG CAPSULE    Take 200 mg by mouth daily.   COLCHICINE 0.6 MG TABLET    Take 1 tablet (0.6 mg total) by mouth 2 (two) times daily.   CYCLOSPORINE (RESTASIS) 0.05 % OPHTHALMIC EMULSION    Place 1 drop into both eyes 2 (two) times daily.   MONTELUKAST (SINGULAIR) 10 MG TABLET    Take 1 tablet (10 mg total) by mouth at bedtime.   POTASSIUM  CHLORIDE SA (K-DUR,KLOR-CON) 20 MEQ TABLET    Take 1 tablet (20 mEq total) by mouth daily.   QUINAPRIL (ACCUPRIL) 20 MG TABLET    Take 20 mg by mouth at bedtime.   RANITIDINE (ZANTAC) 150 MG CAPSULE    Take 150 mg by mouth daily.    ROSUVASTATIN (CRESTOR) 10 MG TABLET    Take 1 tablet (10 mg total) by mouth at bedtime.   Dyazide (Hydrochlorothiazide25 and Triamterene37.5)    Take one cap by mouth daily   TRAMADOL (ULTRAM) 50 MG TABLET    Take 1-2 tablets (50-100 mg total) by mouth every 6 (six) hours as needed (mild pain).  Modified Medications   No medications on file  Discontinued Medications   AMLODIPINE (NORVASC) 10 MG TABLET    Take 1 tablet (10 mg total) by mouth daily.   ASPIRIN 81 MG CHEWABLE TABLET    Chew 81 mg by mouth daily.   FLUCONAZOLE (DIFLUCAN) 100 MG TABLET    Take 2 today, then 1 daily until gone   HYDROCHLOROTHIAZIDE (HYDRODIURIL) 25 MG TABLET    Take 1 tablet by mouth  daily   QUINAPRIL (ACCUPRIL) 10 MG TABLET    Take 1 tablet (10 mg total) by mouth daily.

## 2018-03-03 ENCOUNTER — Ambulatory Visit
Admission: RE | Admit: 2018-03-03 | Discharge: 2018-03-03 | Disposition: A | Discharge status: Home / Self Care | Payer: Medicare Other | Source: Ambulatory Visit | Attending: Neurosurgery | Admitting: Neurosurgery

## 2018-03-03 DIAGNOSIS — M47817 Spondylosis without myelopathy or radiculopathy, lumbosacral region: Secondary | ICD-10-CM | POA: Diagnosis not present

## 2018-03-03 DIAGNOSIS — M5416 Radiculopathy, lumbar region: Secondary | ICD-10-CM

## 2018-03-03 MED ORDER — IOPAMIDOL (ISOVUE-M 200) INJECTION 41%
1.0000 mL | Freq: Once | INTRAMUSCULAR | Status: AC
  Administered 2018-03-03: 1 mL via INTRA_ARTICULAR

## 2018-03-03 MED ORDER — METHYLPREDNISOLONE ACETATE 40 MG/ML INJ SUSP (RADIOLOG
120.0000 mg | Freq: Once | INTRAMUSCULAR | Status: AC
  Administered 2018-03-03: 120 mg via INTRA_ARTICULAR

## 2018-03-03 NOTE — Discharge Instructions (Signed)

## 2018-03-08 DIAGNOSIS — Z79899 Other long term (current) drug therapy: Secondary | ICD-10-CM | POA: Diagnosis not present

## 2018-03-08 DIAGNOSIS — Z8739 Personal history of other diseases of the musculoskeletal system and connective tissue: Secondary | ICD-10-CM | POA: Diagnosis not present

## 2018-03-08 DIAGNOSIS — Z96651 Presence of right artificial knee joint: Secondary | ICD-10-CM | POA: Diagnosis not present

## 2018-03-08 DIAGNOSIS — M1189 Other specified crystal arthropathies, multiple sites: Secondary | ICD-10-CM | POA: Diagnosis not present

## 2018-03-08 DIAGNOSIS — Z7982 Long term (current) use of aspirin: Secondary | ICD-10-CM | POA: Diagnosis not present

## 2018-03-08 DIAGNOSIS — M48061 Spinal stenosis, lumbar region without neurogenic claudication: Secondary | ICD-10-CM | POA: Diagnosis not present

## 2018-03-11 DIAGNOSIS — H2512 Age-related nuclear cataract, left eye: Secondary | ICD-10-CM | POA: Diagnosis not present

## 2018-03-11 DIAGNOSIS — H52223 Regular astigmatism, bilateral: Secondary | ICD-10-CM | POA: Diagnosis not present

## 2018-03-11 DIAGNOSIS — H04123 Dry eye syndrome of bilateral lacrimal glands: Secondary | ICD-10-CM | POA: Diagnosis not present

## 2018-03-11 DIAGNOSIS — H43813 Vitreous degeneration, bilateral: Secondary | ICD-10-CM | POA: Diagnosis not present

## 2018-03-11 DIAGNOSIS — H5213 Myopia, bilateral: Secondary | ICD-10-CM | POA: Diagnosis not present

## 2018-03-11 DIAGNOSIS — H524 Presbyopia: Secondary | ICD-10-CM | POA: Diagnosis not present

## 2018-03-11 DIAGNOSIS — H25811 Combined forms of age-related cataract, right eye: Secondary | ICD-10-CM | POA: Diagnosis not present

## 2018-03-22 ENCOUNTER — Other Ambulatory Visit: Payer: Self-pay | Admitting: *Deleted

## 2018-03-22 MED ORDER — PANTOPRAZOLE SODIUM 40 MG PO TBEC: 40.0000 mg | DELAYED_RELEASE_TABLET | Freq: Every day | ORAL | 3 refills | Status: DC

## 2018-03-24 ENCOUNTER — Other Ambulatory Visit: Payer: Self-pay | Admitting: *Deleted

## 2018-03-24 MED ORDER — MONTELUKAST SODIUM 10 MG PO TABS: 10.0000 mg | ORAL_TABLET | Freq: Every day | ORAL | 11 refills | Status: DC

## 2018-04-18 DIAGNOSIS — Z85820 Personal history of malignant melanoma of skin: Secondary | ICD-10-CM | POA: Diagnosis not present

## 2018-04-18 DIAGNOSIS — L570 Actinic keratosis: Secondary | ICD-10-CM | POA: Diagnosis not present

## 2018-04-18 DIAGNOSIS — L821 Other seborrheic keratosis: Secondary | ICD-10-CM | POA: Diagnosis not present

## 2018-04-18 DIAGNOSIS — Z85828 Personal history of other malignant neoplasm of skin: Secondary | ICD-10-CM | POA: Diagnosis not present

## 2018-04-20 ENCOUNTER — Other Ambulatory Visit: Payer: Self-pay | Admitting: Neurosurgery

## 2018-04-20 DIAGNOSIS — M5416 Radiculopathy, lumbar region: Secondary | ICD-10-CM

## 2018-04-25 DIAGNOSIS — M792 Neuralgia and neuritis, unspecified: Secondary | ICD-10-CM | POA: Diagnosis not present

## 2018-04-25 DIAGNOSIS — H9313 Tinnitus, bilateral: Secondary | ICD-10-CM | POA: Diagnosis not present

## 2018-04-25 DIAGNOSIS — H8103 Meniere's disease, bilateral: Secondary | ICD-10-CM | POA: Diagnosis not present

## 2018-04-25 DIAGNOSIS — H903 Sensorineural hearing loss, bilateral: Secondary | ICD-10-CM | POA: Diagnosis not present

## 2018-04-26 ENCOUNTER — Other Ambulatory Visit: Payer: Self-pay | Admitting: Otolaryngology

## 2018-04-26 DIAGNOSIS — H9201 Otalgia, right ear: Secondary | ICD-10-CM

## 2018-04-26 DIAGNOSIS — H8103 Meniere's disease, bilateral: Secondary | ICD-10-CM

## 2018-04-26 DIAGNOSIS — H903 Sensorineural hearing loss, bilateral: Secondary | ICD-10-CM

## 2018-04-26 DIAGNOSIS — M792 Neuralgia and neuritis, unspecified: Secondary | ICD-10-CM

## 2018-04-26 DIAGNOSIS — H9313 Tinnitus, bilateral: Secondary | ICD-10-CM

## 2018-04-29 DIAGNOSIS — Z981 Arthrodesis status: Secondary | ICD-10-CM | POA: Diagnosis not present

## 2018-04-29 DIAGNOSIS — M5136 Other intervertebral disc degeneration, lumbar region: Secondary | ICD-10-CM | POA: Diagnosis not present

## 2018-04-29 DIAGNOSIS — I10 Essential (primary) hypertension: Secondary | ICD-10-CM | POA: Diagnosis not present

## 2018-04-29 DIAGNOSIS — Z6825 Body mass index (BMI) 25.0-25.9, adult: Secondary | ICD-10-CM | POA: Diagnosis not present

## 2018-04-29 DIAGNOSIS — R202 Paresthesia of skin: Secondary | ICD-10-CM | POA: Diagnosis not present

## 2018-04-29 DIAGNOSIS — M48062 Spinal stenosis, lumbar region with neurogenic claudication: Secondary | ICD-10-CM | POA: Diagnosis not present

## 2018-04-29 DIAGNOSIS — M545 Low back pain: Secondary | ICD-10-CM | POA: Diagnosis not present

## 2018-04-29 DIAGNOSIS — G8929 Other chronic pain: Secondary | ICD-10-CM | POA: Diagnosis not present

## 2018-05-01 ENCOUNTER — Ambulatory Visit
Admission: RE | Admit: 2018-05-01 | Discharge: 2018-05-01 | Disposition: A | Discharge status: Home / Self Care | Payer: No Typology Code available for payment source | Source: Ambulatory Visit | Attending: Otolaryngology | Admitting: Otolaryngology

## 2018-05-01 DIAGNOSIS — M792 Neuralgia and neuritis, unspecified: Secondary | ICD-10-CM

## 2018-05-01 DIAGNOSIS — H903 Sensorineural hearing loss, bilateral: Secondary | ICD-10-CM

## 2018-05-01 DIAGNOSIS — H9313 Tinnitus, bilateral: Secondary | ICD-10-CM

## 2018-05-01 DIAGNOSIS — M50222 Other cervical disc displacement at C5-C6 level: Secondary | ICD-10-CM | POA: Diagnosis not present

## 2018-05-01 DIAGNOSIS — H8103 Meniere's disease, bilateral: Secondary | ICD-10-CM

## 2018-05-01 DIAGNOSIS — R202 Paresthesia of skin: Secondary | ICD-10-CM | POA: Diagnosis not present

## 2018-05-01 DIAGNOSIS — H9201 Otalgia, right ear: Secondary | ICD-10-CM

## 2018-05-01 MED ORDER — GADOBENATE DIMEGLUMINE 529 MG/ML IV SOLN
16.0000 mL | Freq: Once | INTRAVENOUS | Status: AC | PRN
  Administered 2018-05-01: 16 mL via INTRAVENOUS

## 2018-05-03 ENCOUNTER — Other Ambulatory Visit: Payer: Self-pay | Admitting: Neurosurgery

## 2018-05-03 DIAGNOSIS — Z6825 Body mass index (BMI) 25.0-25.9, adult: Secondary | ICD-10-CM | POA: Diagnosis not present

## 2018-05-03 DIAGNOSIS — M5030 Other cervical disc degeneration, unspecified cervical region: Secondary | ICD-10-CM | POA: Diagnosis not present

## 2018-05-03 DIAGNOSIS — I10 Essential (primary) hypertension: Secondary | ICD-10-CM | POA: Diagnosis not present

## 2018-05-03 DIAGNOSIS — M5020 Other cervical disc displacement, unspecified cervical region: Secondary | ICD-10-CM | POA: Diagnosis not present

## 2018-05-03 DIAGNOSIS — M47812 Spondylosis without myelopathy or radiculopathy, cervical region: Secondary | ICD-10-CM | POA: Diagnosis not present

## 2018-05-03 DIAGNOSIS — M542 Cervicalgia: Secondary | ICD-10-CM | POA: Diagnosis not present

## 2018-05-04 ENCOUNTER — Other Ambulatory Visit: Payer: Self-pay | Admitting: Neurosurgery

## 2018-05-10 NOTE — Pre-Procedure Instructions (Signed)
Peter Bachelor, MD  05/10/2018      [id number] [name] [address] [name] [address] Phone: [phone] Fax: [phone]  [name], [name], [name] [address] [place] [address] Phone: [phone] Fax: [phone]  [name], [place] - [name] [address] Phone: [phone] Fax: [phone]  [name], [place] [name] [place] [address] Phone: [phone] Fax: [place], [address] [place] [address] Phone: [phone] Fax: [phone]  [id number] - [place], [place] [name] [place] [address] Phone: [phone] Fax: [phone]    Your procedure is scheduled on May 18, 2018.  Report to [facility] Admitting at 530 AM.  Call this number if you have problems the morning of surgery:  [phone]   Remember:  Do not eat or drink after midnight.      Take these medicines the morning of surgery with A SIP OF WATER  alfuzosin (uroxatral) [name] (xanax)-if needed [name] (norvasc) [name] eye drops [facility] mouthwash-if needed [name] (protonix) [name] (zantac) [name] (ultram)-if needed for pain  7 days prior to surgery STOP taking any Celebrex, Aspirin (unless otherwise instructed by your surgeon), Aleve, Naproxen, Ibuprofen, Motrin, Advil, Goody's, BC's, all herbal medications, fish oil, and all vitamins   Do not wear jewelry  Do not wear lotions, powders, or colognes, or deodorant.  Men may shave face and neck.  Do not bring valuables to the hospital.  [facility] is not responsible for any belongings or valuables.  Contacts, dentures or bridgework may not be worn into surgery.  Leave your suitcase in the car.  After surgery it may be brought to your room.  For patients admitted to the hospital, discharge time will be determined by your treatment team.  Patients discharged the day of surgery will not be allowed to drive home.    [name]- Preparing For Surgery  Before surgery, you can play an important role. Because skin is not sterile, your skin needs to be as free of germs as possible. You can reduce the number of germs on your skin by washing with CHG (chlorahexidine gluconate) Soap before surgery.  CHG is an antiseptic cleaner which kills germs and bonds with the skin to continue killing germs even after washing.    Oral Hygiene is also important to reduce your risk of infection.  Remember - BRUSH YOUR TEETH THE MORNING OF SURGERY WITH YOUR REGULAR TOOTHPASTE  Please do not use if you have an allergy to CHG or antibacterial soaps. If your skin becomes reddened/irritated stop using the CHG.  Do not shave (including legs and underarms) for at least 48 hours prior to first CHG shower. It is OK to shave your face.  Please follow these instructions carefully.   1. Shower the NIGHT BEFORE SURGERY and the MORNING OF SURGERY with CHG.   2. If you chose to wash your hair, wash your hair first as usual with your normal shampoo.  3. After you shampoo, rinse your hair and body thoroughly to remove the shampoo.  4. Use CHG as you would any other  liquid soap. You can apply CHG directly to the skin and wash gently with a scrungie or a clean washcloth.   5. Apply the CHG Soap to your body ONLY FROM THE NECK DOWN.  Do not use on open wounds or open sores. Avoid contact with your eyes, ears, mouth and genitals (private parts). Wash Face and genitals (private parts)  with your normal soap.  6. Wash thoroughly, paying special attention to the area  where your surgery will be performed.  7. Thoroughly rinse your body with warm water from the neck down.  8. DO NOT shower/wash with your normal soap after using and rinsing off the CHG Soap.  9. Pat yourself dry with a CLEAN TOWEL.  10. Wear CLEAN PAJAMAS to bed the night before surgery, wear comfortable clothes the morning of surgery  11. Place CLEAN SHEETS on your bed the night of your first shower and DO NOT SLEEP WITH PETS.  Day of Surgery:  Do not apply any deodorants/lotions.  Please wear clean clothes to the hospital/surgery center.   Remember to brush your teeth WITH YOUR REGULAR TOOTHPASTE.   Please read over the following fact sheets that you were given. Pain Booklet, Coughing and Deep Breathing, MRSA Information and Surgical Site Infection Prevention

## 2018-05-11 ENCOUNTER — Encounter (HOSPITAL_COMMUNITY)
Admission: RE | Admit: 2018-05-11 | Discharge: 2018-05-11 | Disposition: A | Discharge status: Home / Self Care | Payer: Medicare Other | Source: Ambulatory Visit | Attending: Neurosurgery | Admitting: Neurosurgery

## 2018-05-11 ENCOUNTER — Encounter (HOSPITAL_COMMUNITY): Payer: Self-pay

## 2018-05-11 ENCOUNTER — Other Ambulatory Visit: Payer: Self-pay

## 2018-05-11 DIAGNOSIS — Z01812 Encounter for preprocedural laboratory examination: Secondary | ICD-10-CM | POA: Diagnosis not present

## 2018-05-11 DIAGNOSIS — Z01818 Encounter for other preprocedural examination: Secondary | ICD-10-CM | POA: Diagnosis not present

## 2018-05-11 HISTORY — DX: Other cervical disc displacement, unspecified cervical region: M50.20

## 2018-05-11 LAB — SURGICAL PCR SCREEN
MRSA, PCR: NEGATIVE
Staphylococcus aureus: NEGATIVE

## 2018-05-11 LAB — CBC
HCT: 46.6 % (ref 39.0–52.0)
Hemoglobin: 15 g/dL (ref 13.0–17.0)
MCH: 30.1 pg (ref 26.0–34.0)
MCHC: 32.2 g/dL (ref 30.0–36.0)
MCV: 93.6 fL (ref 78.0–100.0)
Platelets: 272 10*3/uL (ref 150–400)
RBC: 4.98 MIL/uL (ref 4.22–5.81)
RDW: 11.8 % (ref 11.5–15.5)
WBC: 8.4 10*3/uL (ref 4.0–10.5)

## 2018-05-11 LAB — BASIC METABOLIC PANEL
Anion gap: 9 (ref 5–15)
BUN: 20 mg/dL (ref 8–23)
CO2: 28 mmol/L (ref 22–32)
Calcium: 9.7 mg/dL (ref 8.9–10.3)
Chloride: 103 mmol/L (ref 98–111)
Creatinine, Ser: 1.21 mg/dL (ref 0.61–1.24)
GFR calc Af Amer: >60 mL/min (ref >60)
GFR calc non Af Amer: 59 mL/min — ABNORMAL LOW (ref >60)
Glucose, Bld: 139 mg/dL — ABNORMAL HIGH (ref 70–99)
Potassium: 4.6 mmol/L (ref 3.5–5.1)
Sodium: 140 mmol/L (ref 135–145)

## 2018-05-11 NOTE — Progress Notes (Signed)
PCP - Dr. Teressa Lower  Cardiologist - Dr. Virl Axe- has not had to see in 2-3 yrs  Chest x-ray - 02/25/18 (E)  EKG - 05/11/18  Stress Test - Denies  ECHO - 05/2015 (E)  Cardiac Cath - Denies  Sleep Study - Denies CPAP - None  LABS- 05/11/18: CBC, BMP  ASA- Denies   Anesthesia- No  Pt denies having chest pain, sob, or fever at this time. All instructions explained to the pt, with a verbal understanding of the material. Pt agrees to go over the instructions while at home for a better understanding. The opportunity to ask questions was provided.

## 2018-05-16 ENCOUNTER — Other Ambulatory Visit: Payer: Self-pay | Admitting: Neurosurgery

## 2018-05-17 ENCOUNTER — Other Ambulatory Visit: Payer: Self-pay | Admitting: Neurosurgery

## 2018-05-17 NOTE — Anesthesia Preprocedure Evaluation (Signed)
Anesthesia Evaluation  Patient identified by MRN, date of birth, ID band Patient awake    Reviewed: NPO status , Patient's Chart, lab work & pertinent test results, reviewed documented beta blocker date and time   History of Anesthesia Complications (+) history of anesthetic complications  Airway Mallampati: I  TM Distance: >3 FB Neck ROM: Full    Dental  (+) Teeth Intact   Pulmonary neg pulmonary ROS,    breath sounds clear to auscultation       Cardiovascular hypertension, Pt. on medications + Valvular Problems/Murmurs  Rhythm:Regular Rate:Normal     Neuro/Psych PSYCHIATRIC DISORDERS Anxiety negative neurological ROS     GI/Hepatic hiatal hernia, GERD  Medicated,  Endo/Other    Renal/GU   negative genitourinary   Musculoskeletal  (+) Arthritis , Osteoarthritis,    Abdominal   Peds negative pediatric ROS (+)  Hematology   Anesthesia Other Findings - Trivial TR - Cx of Anesthesia --> Itching - TMJ Disorder   Reproductive/Obstetrics                             Lab Results  Component Value Date   WBC 8.4 05/11/2018   HGB 15.0 05/11/2018   HCT 46.6 05/11/2018   MCV 93.6 05/11/2018   PLT 272 05/11/2018   Lab Results  Component Value Date   CREATININE 1.21 05/11/2018   BUN 20 05/11/2018   NA 140 05/11/2018   K 4.6 05/11/2018   CL 103 05/11/2018   CO2 28 05/11/2018   Lab Results  Component Value Date   INR 1.0 03/09/2017   INR 1.04 09/30/2015   EKG: NSR  Echo 06/03/15 - Left ventricle: The cavity size was normal. Wall thickness was normal. Systolic function was vigorous. The estimated ejection fraction was in the range of 65% to 70%. Wall motion was normal; there were no regional wall motion abnormalities. Doppler parameters are consistent with abnormal left ventricular relaxation (grade 1 diastolic dysfunction). There was no evidence of elevated ventricular  filling pressure by Doppler parameters. - Aortic valve: Structurally normal valve. There was no significant regurgitation. - Aortic root: The aortic root was normal in size. - Ascending aorta: The ascending aorta was normal in size. - Mitral valve: Structurally normal valve. There was no regurgitation. - Left atrium: The atrium was normal in size. - Right atrium: The atrium was normal in size. - Atrial septum: No defect or patent foramen ovale was identified. - Tricuspid valve: Structurally normal valve. There was mild regurgitation. - Pulmonic valve: Structurally normal valve. - Pulmonary arteries: Systolic pressure was within the normal range. - Inferior vena cava: The vessel was normal in size. The respirophasic diameter changes were in the normal range (= 50%), consistent with normal central venous pressure.   Anesthesia Physical  Anesthesia Plan  ASA: III  Anesthesia Plan: General   Post-op Pain Management:    Induction: Intravenous  PONV Risk Score and Plan: 2 and Ondansetron, Dexamethasone and Treatment may vary due to age or medical condition  Airway Management Planned: Oral ETT and Video Laryngoscope Planned  Additional Equipment:   Intra-op Plan:   Post-operative Plan: Extubation in OR  Informed Consent: I have reviewed the patients History and Physical, chart, labs and discussed the procedure including the risks, benefits and alternatives for the proposed anesthesia with the patient or authorized representative who has indicated his/her understanding and acceptance.   Dental advisory given  Plan Discussed with: CRNA, Anesthesiologist and  Surgeon  Anesthesia Plan Comments: (  )        Anesthesia Quick Evaluation

## 2018-05-18 ENCOUNTER — Inpatient Hospital Stay (HOSPITAL_COMMUNITY): Payer: Medicare Other

## 2018-05-18 ENCOUNTER — Inpatient Hospital Stay (HOSPITAL_COMMUNITY)
Admission: RE | Admit: 2018-05-18 | Discharge: 2018-05-19 | DRG: 473 | Disposition: A | Discharge status: Home / Self Care | Payer: Medicare Other | Attending: Neurosurgery | Admitting: Neurosurgery

## 2018-05-18 ENCOUNTER — Inpatient Hospital Stay (HOSPITAL_COMMUNITY): Payer: Medicare Other | Admitting: Emergency Medicine

## 2018-05-18 ENCOUNTER — Other Ambulatory Visit: Payer: Self-pay

## 2018-05-18 ENCOUNTER — Inpatient Hospital Stay (HOSPITAL_COMMUNITY): Payer: Medicare Other | Admitting: Anesthesiology

## 2018-05-18 ENCOUNTER — Encounter (HOSPITAL_COMMUNITY)
Admission: RE | Disposition: A | Discharge status: Home / Self Care | Payer: Self-pay | Source: Home / Self Care | Attending: Neurosurgery

## 2018-05-18 ENCOUNTER — Encounter (HOSPITAL_COMMUNITY): Payer: Self-pay | Admitting: Surgery

## 2018-05-18 DIAGNOSIS — Z885 Allergy status to narcotic agent status: Secondary | ICD-10-CM | POA: Diagnosis not present

## 2018-05-18 DIAGNOSIS — Z881 Allergy status to other antibiotic agents status: Secondary | ICD-10-CM | POA: Diagnosis not present

## 2018-05-18 DIAGNOSIS — Z79891 Long term (current) use of opiate analgesic: Secondary | ICD-10-CM | POA: Diagnosis not present

## 2018-05-18 DIAGNOSIS — Z981 Arthrodesis status: Secondary | ICD-10-CM | POA: Diagnosis not present

## 2018-05-18 DIAGNOSIS — Z86010 Personal history of colonic polyps: Secondary | ICD-10-CM | POA: Diagnosis not present

## 2018-05-18 DIAGNOSIS — H9319 Tinnitus, unspecified ear: Secondary | ICD-10-CM | POA: Diagnosis present

## 2018-05-18 DIAGNOSIS — R011 Cardiac murmur, unspecified: Secondary | ICD-10-CM | POA: Diagnosis present

## 2018-05-18 DIAGNOSIS — I10 Essential (primary) hypertension: Secondary | ICD-10-CM | POA: Diagnosis present

## 2018-05-18 DIAGNOSIS — M5020 Other cervical disc displacement, unspecified cervical region: Secondary | ICD-10-CM | POA: Diagnosis present

## 2018-05-18 DIAGNOSIS — Z888 Allergy status to other drugs, medicaments and biological substances status: Secondary | ICD-10-CM | POA: Diagnosis not present

## 2018-05-18 DIAGNOSIS — M1710 Unilateral primary osteoarthritis, unspecified knee: Secondary | ICD-10-CM | POA: Diagnosis present

## 2018-05-18 DIAGNOSIS — Z85820 Personal history of malignant melanoma of skin: Secondary | ICD-10-CM

## 2018-05-18 DIAGNOSIS — Z79899 Other long term (current) drug therapy: Secondary | ICD-10-CM

## 2018-05-18 DIAGNOSIS — F419 Anxiety disorder, unspecified: Secondary | ICD-10-CM | POA: Diagnosis present

## 2018-05-18 DIAGNOSIS — E7800 Pure hypercholesterolemia, unspecified: Secondary | ICD-10-CM | POA: Diagnosis present

## 2018-05-18 DIAGNOSIS — Z96651 Presence of right artificial knee joint: Secondary | ICD-10-CM | POA: Diagnosis present

## 2018-05-18 DIAGNOSIS — M50022 Cervical disc disorder at C5-C6 level with myelopathy: Principal | ICD-10-CM | POA: Diagnosis present

## 2018-05-18 DIAGNOSIS — H9190 Unspecified hearing loss, unspecified ear: Secondary | ICD-10-CM | POA: Diagnosis present

## 2018-05-18 DIAGNOSIS — K219 Gastro-esophageal reflux disease without esophagitis: Secondary | ICD-10-CM | POA: Diagnosis not present

## 2018-05-18 DIAGNOSIS — Z820 Family history of epilepsy and other diseases of the nervous system: Secondary | ICD-10-CM | POA: Diagnosis not present

## 2018-05-18 DIAGNOSIS — E785 Hyperlipidemia, unspecified: Secondary | ICD-10-CM | POA: Diagnosis present

## 2018-05-18 DIAGNOSIS — Z974 Presence of external hearing-aid: Secondary | ICD-10-CM

## 2018-05-18 DIAGNOSIS — M4322 Fusion of spine, cervical region: Secondary | ICD-10-CM | POA: Diagnosis not present

## 2018-05-18 DIAGNOSIS — M2578 Osteophyte, vertebrae: Secondary | ICD-10-CM | POA: Diagnosis present

## 2018-05-18 DIAGNOSIS — Z91048 Other nonmedicinal substance allergy status: Secondary | ICD-10-CM

## 2018-05-18 DIAGNOSIS — Z419 Encounter for procedure for purposes other than remedying health state, unspecified: Secondary | ICD-10-CM

## 2018-05-18 DIAGNOSIS — M50222 Other cervical disc displacement at C5-C6 level: Secondary | ICD-10-CM | POA: Diagnosis not present

## 2018-05-18 HISTORY — PX: ANTERIOR CERVICAL DECOMP/DISCECTOMY FUSION: SHX1161

## 2018-05-18 SURGERY — ANTERIOR CERVICAL DECOMPRESSION/DISCECTOMY FUSION 1 LEVEL/HARDWARE REMOVAL
Anesthesia: General | Site: Spine Cervical

## 2018-05-18 MED ORDER — MENTHOL 3 MG MT LOZG
1.0000 | LOZENGE | OROMUCOSAL | Status: DC | PRN
Start: 2018-05-18 — End: 2018-05-19

## 2018-05-18 MED ORDER — SUGAMMADEX SODIUM 200 MG/2ML IV SOLN
INTRAVENOUS | Status: DC | PRN
  Administered 2018-05-18: 154.2 mg via INTRAVENOUS

## 2018-05-18 MED ORDER — ACETAMINOPHEN 650 MG RE SUPP: 650.0000 mg | RECTAL | Status: DC | PRN

## 2018-05-18 MED ORDER — HYDROMORPHONE HCL 2 MG PO TABS
1.0000 mg | ORAL_TABLET | ORAL | Status: DC | PRN
Start: 2018-05-18 — End: 2018-05-19

## 2018-05-18 MED ORDER — LIDOCAINE 2% (20 MG/ML) 5 ML SYRINGE
INTRAMUSCULAR | Status: DC | PRN
  Administered 2018-05-18: 100 mg via INTRAVENOUS

## 2018-05-18 MED ORDER — SODIUM CHLORIDE 0.9 % IV SOLN: 250.0000 mL | INTRAVENOUS | Status: DC

## 2018-05-18 MED ORDER — PHENOL 1.4 % MT LIQD: 1.0000 | OROMUCOSAL | Status: DC | PRN

## 2018-05-18 MED ORDER — FLEET ENEMA 7-19 GM/118ML RE ENEM: 1.0000 | ENEMA | Freq: Once | RECTAL | Status: DC | PRN

## 2018-05-18 MED ORDER — ACETAMINOPHEN 325 MG PO TABS: 650.0000 mg | ORAL_TABLET | ORAL | Status: DC | PRN

## 2018-05-18 MED ORDER — PHENYLEPHRINE 40 MCG/ML (10ML) SYRINGE FOR IV PUSH (FOR BLOOD PRESSURE SUPPORT)
PREFILLED_SYRINGE | INTRAVENOUS | Status: AC
  Filled 2018-05-18: qty 10

## 2018-05-18 MED ORDER — FENTANYL CITRATE (PF) 250 MCG/5ML IJ SOLN
INTRAMUSCULAR | Status: AC
  Filled 2018-05-18: qty 5

## 2018-05-18 MED ORDER — ONDANSETRON HCL 4 MG/2ML IJ SOLN
INTRAMUSCULAR | Status: AC
  Filled 2018-05-18: qty 2

## 2018-05-18 MED ORDER — ROCURONIUM BROMIDE 10 MG/ML (PF) SYRINGE
PREFILLED_SYRINGE | INTRAVENOUS | Status: DC | PRN
  Administered 2018-05-18 (×2): 10 mg via INTRAVENOUS
  Administered 2018-05-18: 50 mg via INTRAVENOUS
  Administered 2018-05-18: 10 mg via INTRAVENOUS

## 2018-05-18 MED ORDER — THROMBIN 5000 UNITS EX SOLR
CUTANEOUS | Status: DC | PRN
  Administered 2018-05-18: 5 mL via TOPICAL

## 2018-05-18 MED ORDER — MAGNESIUM HYDROXIDE 400 MG/5ML PO SUSP: 30.0000 mL | Freq: Every day | ORAL | Status: DC | PRN

## 2018-05-18 MED ORDER — VASOPRESSIN 20 UNIT/ML IV SOLN
INTRAVENOUS | Status: DC | PRN
  Administered 2018-05-18 (×3): 1 [IU] via INTRAVENOUS

## 2018-05-18 MED ORDER — BUPIVACAINE HCL (PF) 0.5 % IJ SOLN
INTRAMUSCULAR | Status: DC | PRN
  Administered 2018-05-18: 5 mL

## 2018-05-18 MED ORDER — HYDROXYZINE HCL 50 MG/ML IM SOLN: 50.0000 mg | INTRAMUSCULAR | Status: DC | PRN

## 2018-05-18 MED ORDER — THROMBIN 5000 UNITS EX SOLR
CUTANEOUS | Status: DC | PRN
  Administered 2018-05-18 (×2): 5000 [IU] via TOPICAL

## 2018-05-18 MED ORDER — ALPRAZOLAM 0.25 MG PO TABS: 0.2500 mg | ORAL_TABLET | Freq: Three times a day (TID) | ORAL | Status: DC | PRN

## 2018-05-18 MED ORDER — KETOROLAC TROMETHAMINE 15 MG/ML IJ SOLN
INTRAMUSCULAR | Status: AC
  Filled 2018-05-18: qty 1

## 2018-05-18 MED ORDER — SUGAMMADEX SODIUM 200 MG/2ML IV SOLN
INTRAVENOUS | Status: AC
  Filled 2018-05-18: qty 2

## 2018-05-18 MED ORDER — ACETAMINOPHEN 10 MG/ML IV SOLN
INTRAVENOUS | Status: AC
  Filled 2018-05-18: qty 100

## 2018-05-18 MED ORDER — ALUM & MAG HYDROXIDE-SIMETH 200-200-20 MG/5ML PO SUSP
30.0000 mL | Freq: Four times a day (QID) | ORAL | Status: DC | PRN
Start: 2018-05-18 — End: 2018-05-19

## 2018-05-18 MED ORDER — CHLORHEXIDINE GLUCONATE CLOTH 2 % EX PADS: 6.0000 | MEDICATED_PAD | Freq: Once | CUTANEOUS | Status: DC

## 2018-05-18 MED ORDER — LIDOCAINE-EPINEPHRINE 1 %-1:100000 IJ SOLN
INTRAMUSCULAR | Status: DC | PRN
  Administered 2018-05-18: 5 mL

## 2018-05-18 MED ORDER — MORPHINE SULFATE (PF) 4 MG/ML IV SOLN: 4.0000 mg | INTRAVENOUS | Status: DC | PRN

## 2018-05-18 MED ORDER — FENTANYL CITRATE (PF) 100 MCG/2ML IJ SOLN: 25.0000 ug | INTRAMUSCULAR | Status: DC | PRN

## 2018-05-18 MED ORDER — FENTANYL CITRATE (PF) 250 MCG/5ML IJ SOLN
INTRAMUSCULAR | Status: DC | PRN
  Administered 2018-05-18 (×3): 50 ug via INTRAVENOUS

## 2018-05-18 MED ORDER — LIDOCAINE 2% (20 MG/ML) 5 ML SYRINGE
INTRAMUSCULAR | Status: AC
  Filled 2018-05-18: qty 5

## 2018-05-18 MED ORDER — CEFAZOLIN SODIUM-DEXTROSE 2-4 GM/100ML-% IV SOLN
2.0000 g | INTRAVENOUS | Status: AC
  Administered 2018-05-18: 2 g via INTRAVENOUS

## 2018-05-18 MED ORDER — SODIUM CHLORIDE 0.9% FLUSH
3.0000 mL | Freq: Two times a day (BID) | INTRAVENOUS | Status: DC
  Administered 2018-05-18 (×2): 3 mL via INTRAVENOUS

## 2018-05-18 MED ORDER — 0.9 % SODIUM CHLORIDE (POUR BTL) OPTIME
TOPICAL | Status: DC | PRN
  Administered 2018-05-18: 1000 mL

## 2018-05-18 MED ORDER — THROMBIN 5000 UNITS EX SOLR
CUTANEOUS | Status: AC
  Filled 2018-05-18: qty 15000

## 2018-05-18 MED ORDER — ROSUVASTATIN CALCIUM 10 MG PO TABS
10.0000 mg | ORAL_TABLET | Freq: Every day | ORAL | Status: DC
  Filled 2018-05-18: qty 1
  Filled 2018-05-18: qty 2
  Filled 2018-05-18: qty 1

## 2018-05-18 MED ORDER — HEMOSTATIC AGENTS (NO CHARGE) OPTIME
TOPICAL | Status: DC | PRN
  Administered 2018-05-18: 1 via TOPICAL

## 2018-05-18 MED ORDER — CYCLOSPORINE 0.05 % OP EMUL
1.0000 [drp] | Freq: Two times a day (BID) | OPHTHALMIC | Status: DC
  Filled 2018-05-18 (×3): qty 1

## 2018-05-18 MED ORDER — MEPERIDINE HCL 50 MG/ML IJ SOLN: 6.2500 mg | INTRAMUSCULAR | Status: DC | PRN

## 2018-05-18 MED ORDER — PANTOPRAZOLE SODIUM 20 MG PO TBEC
20.0000 mg | DELAYED_RELEASE_TABLET | Freq: Every day | ORAL | Status: DC
  Filled 2018-05-18: qty 1

## 2018-05-18 MED ORDER — VASOPRESSIN 20 UNIT/ML IV SOLN
INTRAVENOUS | Status: AC
  Filled 2018-05-18: qty 1

## 2018-05-18 MED ORDER — DEXAMETHASONE SODIUM PHOSPHATE 10 MG/ML IJ SOLN
INTRAMUSCULAR | Status: AC
  Filled 2018-05-18: qty 1

## 2018-05-18 MED ORDER — EPHEDRINE SULFATE-NACL 50-0.9 MG/10ML-% IV SOSY
PREFILLED_SYRINGE | INTRAVENOUS | Status: DC | PRN
  Administered 2018-05-18 (×2): 5 mg via INTRAVENOUS
  Administered 2018-05-18: 10 mg via INTRAVENOUS
  Administered 2018-05-18: 5 mg via INTRAVENOUS
  Administered 2018-05-18: 10 mg via INTRAVENOUS
  Administered 2018-05-18: 5 mg via INTRAVENOUS

## 2018-05-18 MED ORDER — ONDANSETRON HCL 4 MG/2ML IJ SOLN
INTRAMUSCULAR | Status: DC | PRN
  Administered 2018-05-18: 4 mg via INTRAVENOUS

## 2018-05-18 MED ORDER — DULOXETINE HCL 20 MG PO CPEP
20.0000 mg | ORAL_CAPSULE | Freq: Every day | ORAL | Status: DC
  Filled 2018-05-18: qty 1

## 2018-05-18 MED ORDER — BISACODYL 10 MG RE SUPP: 10.0000 mg | Freq: Every day | RECTAL | Status: DC | PRN

## 2018-05-18 MED ORDER — MIDAZOLAM HCL 2 MG/2ML IJ SOLN
INTRAMUSCULAR | Status: DC | PRN
  Administered 2018-05-18: 2 mg via INTRAVENOUS

## 2018-05-18 MED ORDER — LIDOCAINE-EPINEPHRINE 1 %-1:100000 IJ SOLN
INTRAMUSCULAR | Status: AC
  Filled 2018-05-18: qty 1

## 2018-05-18 MED ORDER — AMLODIPINE BESYLATE 5 MG PO TABS
5.0000 mg | ORAL_TABLET | Freq: Every day | ORAL | Status: DC
  Filled 2018-05-18: qty 1

## 2018-05-18 MED ORDER — CYCLOBENZAPRINE HCL 5 MG PO TABS
5.0000 mg | ORAL_TABLET | Freq: Three times a day (TID) | ORAL | Status: DC | PRN
  Administered 2018-05-18: 5 mg via ORAL
  Filled 2018-05-18: qty 1

## 2018-05-18 MED ORDER — BUPIVACAINE HCL (PF) 0.5 % IJ SOLN
INTRAMUSCULAR | Status: AC
  Filled 2018-05-18: qty 30

## 2018-05-18 MED ORDER — SODIUM CHLORIDE 0.9 % IJ SOLN
INTRAMUSCULAR | Status: AC
  Filled 2018-05-18: qty 10

## 2018-05-18 MED ORDER — EPHEDRINE SULFATE 50 MG/ML IJ SOLN
INTRAMUSCULAR | Status: AC
  Filled 2018-05-18: qty 1

## 2018-05-18 MED ORDER — MIDAZOLAM HCL 2 MG/2ML IJ SOLN
INTRAMUSCULAR | Status: AC
  Filled 2018-05-18: qty 2

## 2018-05-18 MED ORDER — ALFUZOSIN HCL ER 10 MG PO TB24
10.0000 mg | ORAL_TABLET | Freq: Every day | ORAL | Status: DC
  Filled 2018-05-18: qty 1

## 2018-05-18 MED ORDER — LACTATED RINGERS IV SOLN
INTRAVENOUS | Status: DC | PRN
  Administered 2018-05-18: 07:00:00 via INTRAVENOUS

## 2018-05-18 MED ORDER — TRIAMTERENE-HCTZ 37.5-25 MG PO TABS
1.0000 | ORAL_TABLET | Freq: Every morning | ORAL | Status: DC
  Filled 2018-05-18: qty 1

## 2018-05-18 MED ORDER — PROPOFOL 10 MG/ML IV BOLUS
INTRAVENOUS | Status: AC
  Filled 2018-05-18: qty 20

## 2018-05-18 MED ORDER — KETOROLAC TROMETHAMINE 30 MG/ML IJ SOLN
15.0000 mg | Freq: Four times a day (QID) | INTRAMUSCULAR | Status: DC
  Administered 2018-05-18: 15 mg via INTRAVENOUS
  Filled 2018-05-18 (×2): qty 1

## 2018-05-18 MED ORDER — POTASSIUM CHLORIDE CRYS ER 20 MEQ PO TBCR
20.0000 meq | EXTENDED_RELEASE_TABLET | Freq: Every day | ORAL | Status: DC
  Filled 2018-05-18: qty 1

## 2018-05-18 MED ORDER — DEXTROSE-NACL 5-0.45 % IV SOLN: INTRAVENOUS | Status: DC

## 2018-05-18 MED ORDER — LISINOPRIL 20 MG PO TABS: 20.0000 mg | ORAL_TABLET | Freq: Every day | ORAL | Status: DC

## 2018-05-18 MED ORDER — HYDROMORPHONE HCL 2 MG PO TABS
1.0000 mg | ORAL_TABLET | ORAL | Status: DC | PRN
Start: 2018-05-18 — End: 2018-05-18

## 2018-05-18 MED ORDER — MONTELUKAST SODIUM 10 MG PO TABS
10.0000 mg | ORAL_TABLET | Freq: Every day | ORAL | Status: DC
  Filled 2018-05-18 (×2): qty 1

## 2018-05-18 MED ORDER — KETOROLAC TROMETHAMINE 30 MG/ML IJ SOLN
15.0000 mg | Freq: Once | INTRAMUSCULAR | Status: AC
  Administered 2018-05-18: 15 mg via INTRAVENOUS

## 2018-05-18 MED ORDER — TRAMADOL HCL 50 MG PO TABS
50.0000 mg | ORAL_TABLET | Freq: Four times a day (QID) | ORAL | Status: DC | PRN
  Administered 2018-05-18 – 2018-05-19 (×3): 50 mg via ORAL
  Filled 2018-05-18 (×3): qty 1

## 2018-05-18 MED ORDER — SODIUM CHLORIDE 0.9% FLUSH: 3.0000 mL | INTRAVENOUS | Status: DC | PRN

## 2018-05-18 MED ORDER — SODIUM CHLORIDE 0.9 % IV SOLN
INTRAVENOUS | Status: DC | PRN
  Administered 2018-05-18: 25 ug/min via INTRAVENOUS

## 2018-05-18 MED ORDER — ROCURONIUM BROMIDE 10 MG/ML (PF) SYRINGE
PREFILLED_SYRINGE | INTRAVENOUS | Status: AC
  Filled 2018-05-18: qty 10

## 2018-05-18 MED ORDER — HYDROXYZINE HCL 25 MG PO TABS: 50.0000 mg | ORAL_TABLET | ORAL | Status: DC | PRN

## 2018-05-18 MED ORDER — PROPOFOL 10 MG/ML IV BOLUS
INTRAVENOUS | Status: DC | PRN
  Administered 2018-05-18: 200 mg via INTRAVENOUS

## 2018-05-18 MED ORDER — CEFAZOLIN SODIUM-DEXTROSE 2-4 GM/100ML-% IV SOLN: 2.0000 g | INTRAVENOUS | Status: DC

## 2018-05-18 MED ORDER — ACETAMINOPHEN 10 MG/ML IV SOLN
INTRAVENOUS | Status: DC | PRN
  Administered 2018-05-18: 1000 mg via INTRAVENOUS

## 2018-05-18 MED ORDER — DEXAMETHASONE SODIUM PHOSPHATE 10 MG/ML IJ SOLN
INTRAMUSCULAR | Status: DC | PRN
  Administered 2018-05-18: 10 mg via INTRAVENOUS

## 2018-05-18 MED ORDER — COLCHICINE 0.6 MG PO TABS
0.6000 mg | ORAL_TABLET | Freq: Two times a day (BID) | ORAL | Status: DC
  Administered 2018-05-18: 0.6 mg via ORAL
  Filled 2018-05-18 (×3): qty 1

## 2018-05-18 SURGICAL SUPPLY — 66 items
ADH SKN CLS APL DERMABOND .7 (GAUZE/BANDAGES/DRESSINGS) ×1
BAG DECANTER FOR FLEXI CONT (MISCELLANEOUS) ×2 IMPLANT
BIT DRILL 14X2.5XNS TI ANT (BIT) IMPLANT
BIT DRILL AVIATOR 14 (BIT) ×2
BIT DRILL NEURO 2X3.1 SFT TUCH (MISCELLANEOUS) ×1 IMPLANT
BIT DRL 14X2.5XNS TI ANT (BIT) ×1
BLADE ULTRA TIP 2M (BLADE) ×1 IMPLANT
CANISTER SUCT 3000ML PPV (MISCELLANEOUS) ×2 IMPLANT
CARTRIDGE OIL MAESTRO DRILL (MISCELLANEOUS) ×1 IMPLANT
COVER MAYO STAND STRL (DRAPES) ×1 IMPLANT
DECANTER SPIKE VIAL GLASS SM (MISCELLANEOUS) ×2 IMPLANT
DERMABOND ADVANCED (GAUZE/BANDAGES/DRESSINGS) ×1
DERMABOND ADVANCED .7 DNX12 (GAUZE/BANDAGES/DRESSINGS) ×1 IMPLANT
DIFFUSER DRILL AIR PNEUMATIC (MISCELLANEOUS) ×2 IMPLANT
DRAPE HALF SHEET 40X57 (DRAPES) ×1 IMPLANT
DRAPE LAPAROTOMY 100X72 PEDS (DRAPES) ×2 IMPLANT
DRAPE MICROSCOPE LEICA (MISCELLANEOUS) ×2 IMPLANT
DRAPE POUCH INSTRU U-SHP 10X18 (DRAPES) ×2 IMPLANT
DRILL NEURO 2X3.1 SOFT TOUCH (MISCELLANEOUS) ×2
ELECT COATED BLADE 2.86 ST (ELECTRODE) ×2 IMPLANT
ELECT REM PT RETURN 9FT ADLT (ELECTROSURGICAL) ×2
ELECTRODE REM PT RTRN 9FT ADLT (ELECTROSURGICAL) ×1 IMPLANT
GAUZE SPONGE 4X4 12PLY STRL LF (GAUZE/BANDAGES/DRESSINGS) ×1 IMPLANT
GLOVE BIO SURGEON STRL SZ8 (GLOVE) ×1 IMPLANT
GLOVE BIOGEL PI IND STRL 6.5 (GLOVE) IMPLANT
GLOVE BIOGEL PI IND STRL 7.5 (GLOVE) IMPLANT
GLOVE BIOGEL PI IND STRL 8 (GLOVE) ×1 IMPLANT
GLOVE BIOGEL PI INDICATOR 6.5 (GLOVE) ×1
GLOVE BIOGEL PI INDICATOR 7.5 (GLOVE) ×6
GLOVE BIOGEL PI INDICATOR 8 (GLOVE) ×1
GLOVE ECLIPSE 7.5 STRL STRAW (GLOVE) ×2 IMPLANT
GLOVE EXAM NITRILE LRG STRL (GLOVE) IMPLANT
GLOVE EXAM NITRILE XL STR (GLOVE) IMPLANT
GLOVE EXAM NITRILE XS STR PU (GLOVE) IMPLANT
GOWN STRL REUS W/ TWL LRG LVL3 (GOWN DISPOSABLE) ×1 IMPLANT
GOWN STRL REUS W/ TWL XL LVL3 (GOWN DISPOSABLE) ×1 IMPLANT
GOWN STRL REUS W/TWL 2XL LVL3 (GOWN DISPOSABLE) ×1 IMPLANT
GOWN STRL REUS W/TWL LRG LVL3 (GOWN DISPOSABLE)
GOWN STRL REUS W/TWL XL LVL3 (GOWN DISPOSABLE) ×4
GRAFT CORT CANC 9X14X11MM (Bone Implant) ×1 IMPLANT
HALTER HD/CHIN CERV TRACTION D (MISCELLANEOUS) ×2 IMPLANT
HEMOSTAT POWDER KIT SURGIFOAM (HEMOSTASIS) ×2 IMPLANT
KIT BASIN OR (CUSTOM PROCEDURE TRAY) ×2 IMPLANT
KIT TURNOVER KIT B (KITS) ×2 IMPLANT
NDL HYPO 25X1 1.5 SAFETY (NEEDLE) ×1 IMPLANT
NDL SPNL 22GX3.5 QUINCKE BK (NEEDLE) ×1 IMPLANT
NEEDLE HYPO 25X1 1.5 SAFETY (NEEDLE) ×2 IMPLANT
NEEDLE SPNL 22GX3.5 QUINCKE BK (NEEDLE) ×2 IMPLANT
NS IRRIG 1000ML POUR BTL (IV SOLUTION) ×2 IMPLANT
OIL CARTRIDGE MAESTRO DRILL (MISCELLANEOUS) ×2
PACK LAMINECTOMY NEURO (CUSTOM PROCEDURE TRAY) ×2 IMPLANT
PAD ARMBOARD 7.5X6 YLW CONV (MISCELLANEOUS) ×6 IMPLANT
PLATE AVIATOR ASSY 2LVL SZ 30 (Plate) ×1 IMPLANT
RUBBERBAND STERILE (MISCELLANEOUS) ×4 IMPLANT
SCREW SELF TAP AVIATOR 4.35X14 (Screw) ×3 IMPLANT
SCREW SELF TAP AVIATOR 4X14 (Screw) ×3 IMPLANT
SPONGE INTESTINAL PEANUT (DISPOSABLE) ×2 IMPLANT
SPONGE SURGIFOAM ABS GEL SZ50 (HEMOSTASIS) ×1 IMPLANT
STAPLER SKIN PROX WIDE 3.9 (STAPLE) IMPLANT
SUT VIC AB 2-0 CP2 18 (SUTURE) ×2 IMPLANT
SUT VIC AB 3-0 SH 8-18 (SUTURE) ×2 IMPLANT
TAPE CLOTH SURG 4X10 WHT LF (GAUZE/BANDAGES/DRESSINGS) ×1 IMPLANT
TOWEL GREEN STERILE (TOWEL DISPOSABLE) ×2 IMPLANT
TOWEL GREEN STERILE FF (TOWEL DISPOSABLE) IMPLANT
TRAY CATH 16FR W/PLASTIC CATH (SET/KITS/TRAYS/PACK) ×1 IMPLANT
WATER STERILE IRR 1000ML POUR (IV SOLUTION) ×2 IMPLANT

## 2018-05-18 NOTE — Progress Notes (Signed)
Vitals:   05/18/18 1124 05/18/18 1132 05/18/18 1150 05/18/18 1538  BP: 131/66  (!) 141/73 129/74  Pulse: 96 (!) 104 95 81  Resp: 18 18 16 16   Temp: (!) 97.5 F (36.4 C)  97.8 F (36.6 C) 97.7 F (36.5 C)  TempSrc:   Oral Oral  SpO2: 96% 97% 96% 97%  Weight:      Height:        Patient has ambulated in the halls twice.  No complaints regarding the neck, but his low back is giving him a moderate amount of pain and discomfort, that is limiting his ambulation to some extent.  Wound clean and dry; no erythema, swelling, ecchymosis, or drainage.  Voiding well.  Plan: Progressing well.  Encouraged to ambulate as feasible.  Hosie Spangle, MD 05/18/2018, 5:06 PM

## 2018-05-18 NOTE — Op Note (Signed)
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 Patient was placed in 10 pounds of halter traction. The neck was prepped with Betadine soap and solution and draped in a sterile fashion.  The C-arm fluoroscope was used throughout the procedure for localization and imaging later of the construct.  A horizontal incision was made on the left side of the neck. The line of the incision was infiltrated with local anesthetic with epinephrine. Dissection was carried down thru the subcutaneous tissue and platysma, bipolar cautery was used to maintain hemostasis. Dissection was then carried down thru an avascular plane leaving the sternocleidomastoid carotid artery and jugular vein laterally and the trachea and esophagus medially. The ventral aspect of the vertebral column was identified, we identified the existing anterior cervical plate at the B2-8 level, and confirmed localization at the C5-6 disc space with C-arm fluoroscopy.  The C5-6 annulus was incised and the disc space entered. Discectomy was performed with micro-curettes and pituitary rongeurs. The operating microscope was draped and brought into the field provided additional magnification illumination and visualization. Discectomy was continued posteriorly thru the disc space and then the cartilaginous endplate was removed using  micro-curettes along with the high-speed drill. Posterior osteophytic overgrowth was removed using the high-speed drill along with a 2 mm thin footplated Kerrison punch. Posterior longitudinal ligament along with disc herniation was carefully removed, decompressing the spinal canal and thecal sac. We then continued to remove osteophytic overgrowth and disc material decompressing the neural foramina and exiting nerve roots bilaterally. Once the decompression was completed hemostasis was established with the use of Gelfoam with thrombin. The Gelfoam was removed, a thin layer of Surgifoam was applied, the wound irrigated and hemostasis confirmed.  We first evaluated whether a disc arthroplasty could be performed.  We were concerned preoperatively because the disc space appeared to have a height of 8 mm.  Using a Medtronic 7 mm sizer, we placed the sizer in the disc space, but it was loose within the disc space, and therefore it was determined that we could not perform a disc arthroplasty with the Medtronic Prestige LP implant.  Therefore proceeded on with a arthrodesis using the [employer] aviator system.  We measured the height of the intravertebral disc space and selected a 9 millimeter in height structural allograft. It was hydrated and saline solution and then gently positioned in the intravertebral disc space and countersunk.  We then cleared the scar tissue off of the existing C6-7 tether anterior cervical plate, and removed each of the 4 screws sequentially and the plate.  The screw holes were filled with Surgifoam and a small pledget of Gelfoam.  Hemostasis was established.  We initially tried to use a single level plate, but could not get adequate secure purchase with 4.35 mm screws at C6, because of the old screw holes, and therefore selected a 2 level 30 mm aviator anterior cervical plate.  We first secured the plate at C5, then at C7.  After one screw was placed at each level, the positioning was checked with  the C-arm fluoroscope.  We then placed the second screw at each of these levels.  Lastly we placed screws at the C6 level.  Each screw hole was drilled with the high-speed drill.  We placed 4 x 14 mm self-tapping variable screws bilaterally at C5 and C6 and 4.35 x 14 mm self-tapping variable screws at C7.  C-arm fluoroscopy imaging was then repeated, and the plate and screws all appeared in good position as did the bone graft at the C5-6 level, and the overall alignment appeared good.  Locking system at each level was then secured.  The wound was irrigated with bacitracin solution checked for hemostasis which was established and confirmed.  We then proceeded with closure. The platysma was closed with interrupted inverted 2-0 undyed Vicryl suture, the subcutaneous and subcuticular closed with interrupted inverted 3-0 undyed Vicryl suture. The skin edges were approximated with Dermabond. Following surgery the patient was taken out of cervical traction. To be reversed and the anesthetic and taken to the recovery room for further care.   PLAN OF CARE: Admit to inpatient   PATIENT DISPOSITION:  PACU - hemodynamically stable.   Delay start of Pharmacological VTE agent (>24hrs) due to surgical blood loss or risk of bleeding:  yes

## 2018-05-18 NOTE — Progress Notes (Signed)
Vitals:   05/18/18 1109 05/18/18 1124 05/18/18 1132 05/18/18 1150  BP: 133/66 131/66  (!) 141/73  Pulse: (!) 104 96 (!) 104 95  Resp: 20 18 18 16   Temp:  (!) 97.5 F (36.4 C)  97.8 F (36.6 C)  TempSrc:    Oral  SpO2: 94% 96% 97% 96%  Weight:      Height:        Patient resting in bed, has been out of bed to the bathroom, and is voided.  Wound clean and dry.  Moving all 4 extremities well.  Comfortable.  Plan: Encouraged to ambulate in the halls with the staff.  Continue to progress through postoperative recovery.  Hosie Spangle, MD 05/18/2018, 12:47 PM

## 2018-05-18 NOTE — H&P (Signed)
Subjective: Patient is a 72 y.o. 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  Patient is status post a previous C6-7 ACDF years ago which is healed well.  MRI of the cervical spine was done to evaluate tingling around the right ear that was found to be due to compression of the right C3 nerve root within the right C2-3 neural foramen, and the large disc herniation at C5-6 is an incidental finding, but there is significant spinal cord compression and decision was made to proceed with decompression.  I discussed with the patient both a C5-6 disc arthroplasty or a C5-6 anterior cervical decompression arthrodesis with structural allograft and cervical plating (which will require removal of his existing C6-7 anterior cervical plate).  Our plan is to attempt the former and consider the latter if necessary.   Patient Active Problem List   Diagnosis Date Noted  . Arthrodesis status 05/08/2016  . Polyneuropathy 05/08/2016  . Other intervertebral disc degeneration, lumbar region 05/08/2016  . Spinal stenosis of lumbar region 05/08/2016  . Anesthesia of skin 05/08/2016  . OA (osteoarthritis) of knee 10/07/2015  . Encounter for immunization 09/03/2015  . Lesion of skeletal muscle 04/22/2015  . HLD (hyperlipidemia) 02/15/2014  . Lumbar stenosis with neurogenic claudication 10/02/2013  . Lumbosacral spinal stenosis 06/09/2013  . Lumbar spondylosis with myelopathy 06/09/2013  . Tear film insufficiency, unspecified 06/05/2013  . Thoracic or lumbosacral neuritis or radiculitis, unspecified 06/05/2013  . Inflammation of shoulder joint 10/09/2011  . History of repair of left rotator cuff 10/09/2011  . Need for diphtheria-tetanus-pertussis (Tdap) vaccine, adult/adolescent 04/13/2011  . Chest pain   . Dizziness   . Physical exam, annual 03/26/2011  . Osteoarthritis 06/21/2010   . Pseudogout involving multiple joints 06/20/2010  . HIP PAIN 06/20/2010  . HYPERCHOLESTEROLEMIA, BORDERLINE 07/19/2008  . TINNITUS, CHRONIC 07/19/2008  . RHEUMATIC FEVER 07/19/2008  . Allergic rhinitis 07/19/2008  . GERD 07/19/2008  . SPONDYLOSIS 07/19/2008  . COLONIC POLYPS, HX OF 07/19/2008  . Melanoma of skin ([place]) 01/11/2008  . Essential hypertension 01/11/2008   Past Medical History:  Diagnosis Date  . Allergic rhinitis   . Anxiety   . Cancer [facility])    Melanoma -peri umbilical '02 or '03- no further problems. 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   . Chest pain    Nuclear 2005, normal / coronary CTA 2008, slight mixed plaque, coronary calcium score 0.65, ejection fraction 55%, echo, March, 2008  . Complication of anesthesia    itching-not sure if oxycodone or anesthesia,and shakes. Right TMJ issue  . Dizziness    Dizziness with question of presyncope April 08, 2011  . DJD (degenerative joint disease)    history DDD, fingers, knees, shoulders  . Ejection fraction    EF 55%, echo, March, 2008.  . Family history of Alzheimer's disease   . GERD (gastroesophageal reflux disease)   . H/O hiatal hernia   . Hearing impaired    bilateral hearing aids  . Heart murmur    72 yrs old rheumatic fever   . Heart murmur 2008   trivial tricusp regurg  . History of colonic polyps   . History of melanoma   . History of pseudogout   . History of recent steroid use    10-10-15 tapering steroid use for tx. recent bronchitis.  Marland Kitchen History of rheumatic fever   . HNP (herniated nucleus pulposus),  cervical   . Hypercholesteremia    borderline  . Hypertension   . Lumbar spondylosis with myelopathy 06/09/2013  . Spondylosis   . Tinnitus    chronic  . TMJ click    right"was aggravated with last intubation"    Past Surgical History:  Procedure Laterality Date  . ANTERIOR CERVICAL DECOMP/DISCECTOMY FUSION  2003  . BACK SURGERY    . CARPAL TUNNEL RELEASE Right 10/31/2013   Procedure:  RIGHT CARPAL TUNNEL RELEASE;  Surgeon: Cammie Sickle., MD;  Location: [facility];  Service: Orthopedics;  Laterality: Right;  . CARPAL TUNNEL RELEASE Left 11/28/2013   Procedure: LEFT CARPAL TUNNEL RELEASE;  Surgeon: Cammie Sickle., MD;  Location: [facility];  Service: Orthopedics;  Laterality: Left;  . CERVICAL DISCECTOMY     anterior with patellar allograft and plating  . JOINT REPLACEMENT    . LUMBAR FUSION  12/14  . MELANOMA EXCISION WITH SENTINEL LYMPH NODE BIOPSY  8/02   bilat ing node bx  . ROTATOR CUFF REPAIR Left    08/13/11  08/27/2011  . TOTAL KNEE ARTHROPLASTY Right 10/07/2015   Procedure: RIGHT TOTAL KNEE ARTHROPLASTY, with synovial tissue specimen;  Surgeon: [name], MD;  Location: [facility];  Service: Orthopedics;  Laterality: Right;    Medications Prior to Admission  Medication Sig Dispense Refill Last Dose  . alfuzosin (UROXATRAL) 10 MG 24 hr tablet Take 10 mg by mouth daily with breakfast.   05/18/2018 at 0510  . ALPRAZolam (XANAX) 0.5 MG tablet Take 1/2 to 1 tablet by mouth three times daily as needed for anxiety 90 tablet 5 05/18/2018 at 0510  . amLODipine (NORVASC) 5 MG tablet Take 5 mg by mouth daily.   05/18/2018 at [place]  . celecoxib (CELEBREX) 200 MG capsule Take 200 mg by mouth daily.   Past Month at Unknown time  . colchicine 0.6 MG tablet Take 1 tablet (0.6 mg total) by mouth 2 (two) times daily. 180 tablet 3 05/18/2018 at 0510  . cycloSPORINE (RESTASIS) 0.05 % ophthalmic emulsion Place 1 drop into both eyes 2 (two) times daily.   05/18/2018 at [place]  . DULoxetine (CYMBALTA) 20 MG capsule Take 20 mg by mouth daily.   05/18/2018 at [place]  . montelukast (SINGULAIR) 10 MG tablet Take 1 tablet (10 mg total) by mouth at bedtime. 30 tablet 11 05/18/2018 at 0510  . pantoprazole (PROTONIX) 20 MG tablet Take 20 mg by mouth daily.   05/18/2018 at [place]  . potassium chloride SA (K-DUR,KLOR-CON) 20 MEQ tablet Take 1 tablet (20 mEq total) by mouth  daily. 90 tablet 3 Past Week at Unknown time  . quinapril (ACCUPRIL) 10 MG tablet Take 20 mg by mouth at bedtime.    05/18/2018 at [place]  . rosuvastatin (CRESTOR) 10 MG tablet Take 1 tablet (10 mg total) by mouth at bedtime. 90 tablet 3 05/17/2018 at Unknown time  . traMADol (ULTRAM) 50 MG tablet Take 1-2 tablets (50-100 mg total) by mouth every 6 (six) hours as needed (mild pain). (Patient taking differently: Take 50 mg by mouth every 6 (six) hours as needed (mild pain). ) 80 tablet 1 05/18/2018 at 0510  . triamterene-hydrochlorothiazide (MAXZIDE-25) 37.5-25 MG tablet Take 1 tablet by mouth every morning.   05/17/2018 at Unknown time  . Diphenhyd-Hydrocort-Nystatin (FIRST-DUKES MOUTHWASH) SUSP Use as directed 15 mLs in the mouth or throat 3 (three) times daily as needed. (Patient not taking: Reported on 05/11/2018) 237 mL 0 Not Taking at  Unknown time  . pantoprazole (PROTONIX) 40 MG tablet Take 1 tablet (40 mg total) by mouth daily. Take 41min before evening meals (Patient not taking: Reported on 05/11/2018) 90 tablet 3 Not Taking at Unknown time   Allergies  Allergen Reactions  . Gentamycin [Gentamicin] Hives and Itching  . Turmeric Other (See Comments)    Cardiac arrythmias  . Bacitracin Rash  . Hydrocodone Rash  . Moxifloxacin Rash    pt had severe red rash- he thought from Avelox Rx....  . Neosporin [Neomycin-Bacitracin Zn-Polymyx] Rash  . Other Other (See Comments)    Redness  bandaide  . Promethazine Other (See Comments)    Restless legs  . Tape Dermatitis    bandaid-skin reaction  . Triamcinolone Acetonide Itching and Rash          Social History   Tobacco Use  . Smoking status: Never Smoker  . Smokeless tobacco: Never Used  Substance Use Topics  . Alcohol use: Yes    Alcohol/week: 1.2 - 2.4 oz    Types: 2 - 4 Glasses of wine per week    Comment: 3 x a week    Family History  Problem Relation Age of Onset  . Alzheimer's disease Father   . Alzheimer's disease Mother       Review of Systems Pertinent items noted in HPI and remainder of comprehensive ROS otherwise negative.  Objective: Vital signs in last 24 hours: Temp:  [98.6 F (37 C)] 98.6 F (37 C) (07/24 0555) Pulse Rate:  [82] 82 (07/24 0555) BP: (142)/(75) 142/75 (07/24 0555) SpO2:  [98 %] 98 % (07/24 0555) Weight:  [77.1 kg (170 lb)] 77.1 kg (170 lb) (07/24 0555)  EXAM: Patient well-developed well-nourished white male in no acute distress.  Lungs are clear to auscultation , the patient has symmetrical respiratory excursion. Heart has a regular rate and rhythm normal S1 and S2 no murmur.   Abdomen is soft nontender nondistended bowel sounds are present. Extremity examination shows no clubbing cyanosis or edema. Motor examination shows 5 over 5 strength in the upper extremities including the deltoid biceps triceps and intrinsics and grip. Sensation is intact to pinprick throughout the digits of the upper extremities. Reflexes are symmetrical and without evidence of pathologic reflexes. Patient has a normal gait and stance.   Data Review:CBC    Component Value Date/Time   WBC 8.4 05/11/2018 1328   RBC 4.98 05/11/2018 1328   HGB 15.0 05/11/2018 1328   HCT 46.6 05/11/2018 1328   PLT 272 05/11/2018 1328   MCV 93.6 05/11/2018 1328   MCH 30.1 05/11/2018 1328   MCHC 32.2 05/11/2018 1328   RDW 11.8 05/11/2018 1328   LYMPHSABS 1.5 03/09/2017 1139   MONOABS 1.2 (H) 03/09/2017 1139   EOSABS 0.1 03/09/2017 1139   BASOSABS 0.2 (H) 03/09/2017 1139                          BMET    Component Value Date/Time   NA 140 05/11/2018 1328   K 4.6 05/11/2018 1328   CL 103 05/11/2018 1328   CO2 28 05/11/2018 1328   GLUCOSE 139 (H) 05/11/2018 1328   BUN 20 05/11/2018 1328   CREATININE 1.21 05/11/2018 1328   CREATININE 0.97 10/09/2011 1140   CALCIUM 9.7 05/11/2018 1328   GFRNONAA 59 (L) 05/11/2018 1328   GFRAA >60 05/11/2018 1328     Assessment/Plan: Patient with incidental large C5-6 cervical disc  herniation with spinal  cord compression.  He is admitted now for decompression, and if considered a C5-6 disc arthroplasty or if necessary at C5-6 there is cervical decompression and arthrodesis with structural allograft and cervical plating, with removal of the existing C6-7 anterior cervical plate.  I've discussed with the patient the nature of his condition, the nature the surgical procedure, the typical length of surgery, hospital stay, and overall recuperation. We discussed limitations postoperatively. I discussed risks of surgery including risks of infection, bleeding, possibly need for transfusion, the risk of nerve root dysfunction with pain, weakness, numbness, or paresthesias, the risk of spinal cord dysfunction with paralysis of all 4 limbs and quadriplegia, and the risk of dural tear and CSF leakage and possible need for further surgery, the risk of esophageal dysfunction causing dysphagia and the risk of laryngeal dysfunction causing hoarseness of the voice, risk of ankylosis with a disc arthroplasty, the risk of failure of the arthrodesis with arthrodesis and the possible need for further surgery, and the risk of anesthetic complications including myocardial infarction, stroke, pneumonia, and death. We also discussed the need for postoperative immobilization in a cervical collar. Understanding all this the patient does wish to proceed with surgery and is admitted for such.    Hosie Spangle, MD 05/18/2018 7:01 AM

## 2018-05-18 NOTE — Transfer of Care (Signed)
Immediate Anesthesia Transfer of Care Note  Patient: Peter Bachelor, MD  Procedure(s) Performed: ANTERIOR CERVICAL DECOMPRESSION/DISCECTOMY FUSION - CERVICAL FIVE-CERVICAL SIX, removal of tether plate (N/A Spine Cervical)  Patient Location: PACU  Anesthesia Type:General  Level of Consciousness: awake, alert  and oriented  Airway & Oxygen Therapy: Patient Spontanous Breathing and Patient connected to nasal cannula oxygen  Post-op Assessment: Report given to RN, Post -op Vital signs reviewed and stable, Patient moving all extremities and Patient moving all extremities X 4  Post vital signs: Reviewed and stable  Last Vitals:  Vitals Value Taken Time  BP 141/74 05/18/2018 10:26 AM  Temp    Pulse 94 05/18/2018 10:27 AM  Resp 13 05/18/2018 10:27 AM  SpO2 97 % 05/18/2018 10:27 AM  Vitals shown include unvalidated device data.  Last Pain:  Vitals:   05/18/18 0615  TempSrc:   PainSc: 0-No pain      Patients Stated Pain Goal: 3 (88/87/57 [id number])  Complications: No apparent anesthesia complications

## 2018-05-18 NOTE — Anesthesia Postprocedure Evaluation (Signed)
Anesthesia Post Note  Patient: [name], MD  Procedure(s) Performed: ANTERIOR CERVICAL DECOMPRESSION/DISCECTOMY FUSION - CERVICAL FIVE-CERVICAL SIX, removal of tether plate (N/A Spine Cervical)     Patient location during evaluation: PACU Anesthesia Type: General Level of consciousness: awake and alert Pain management: pain level controlled Vital Signs Assessment: post-procedure vital signs reviewed and stable Respiratory status: spontaneous breathing, nonlabored ventilation, respiratory function stable and patient connected to nasal cannula oxygen Cardiovascular status: blood pressure returned to baseline and stable Postop Assessment: no apparent nausea or vomiting Anesthetic complications: no    Last Vitals:  Vitals:   05/18/18 1132 05/18/18 1150  BP:  (!) 141/73  Pulse: (!) 104 95  Resp: 18 16  Temp:  36.6 C  SpO2: 97% 96%    Last Pain:  Vitals:   05/18/18 1150  TempSrc: Oral  PainSc:                  Amandeep Hogston

## 2018-05-18 NOTE — Anesthesia Procedure Notes (Signed)
Procedure Name: Intubation Date/Time: 05/18/2018 7:38 AM Performed by: Julieta Bellini, CRNA Pre-anesthesia Checklist: Patient identified, Emergency Drugs available, Suction available and Patient being monitored Patient Re-evaluated:Patient Re-evaluated prior to induction Oxygen Delivery Method: Circle system utilized Preoxygenation: Pre-oxygenation with 100% oxygen Induction Type: IV induction Ventilation: Mask ventilation without difficulty and Oral airway inserted - appropriate to patient size Laryngoscope Size: Glidescope and 4 Grade View: Grade I Tube type: Oral Tube size: 7.0 mm Number of attempts: 1 Airway Equipment and Method: Video-laryngoscopy and Stylet Placement Confirmation: ETT inserted through vocal cords under direct vision,  positive ETCO2 and breath sounds checked- equal and bilateral Secured at: 21 cm Tube secured with: Tape Dental Injury: Teeth and Oropharynx as per pre-operative assessment

## 2018-05-19 ENCOUNTER — Encounter (HOSPITAL_COMMUNITY): Payer: Self-pay | Admitting: Neurosurgery

## 2018-05-19 MED ORDER — CYCLOBENZAPRINE HCL 10 MG PO TABS: 10.0000 mg | ORAL_TABLET | Freq: Three times a day (TID) | ORAL | Status: DC | PRN

## 2018-05-19 MED ORDER — CYCLOBENZAPRINE HCL 10 MG PO TABS: 5.0000 mg | ORAL_TABLET | Freq: Three times a day (TID) | ORAL | 1 refills | Status: DC | PRN

## 2018-05-19 NOTE — Discharge Summary (Signed)
Physician Discharge Summary  Patient ID: Peter SABADO, MD MRN: [id number] DOB/AGE: 1946-04-29 72 y.o.  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  We initially assessed whether we could perform a disc arthroplasty, but the disc space was too tall, and therefore we converted to doing an arthrodesis.  We remove the existing C6-7 tether cervical plate and did arthrodesis at C5-6 with structural allograft and aviator cervical plating.  He is done well following surgery.  His back is giving him some discomfort, but he had essentially no neck discomfort.  His incision is healing nicely.  He is asking to be discharged home.  He is scheduled to follow-up with me in about 3 weeks.  He and his wife have been given instructions regarding wound care and activities following discharge.  Discharge Exam: Blood pressure (!) 142/89, pulse 86, temperature 98 F (36.7 C), temperature source Oral, resp. rate 18, height 5\' 9"  (1.753 m), weight 77.1 kg (170 lb), SpO2 99 %.  Disposition: Discharge disposition: 01-Home or Self Care       Discharge Instructions    Discharge wound care:   Complete by:  As directed    Leave the wound open to air. Shower daily with the wound uncovered. Water and soapy water should run over the incision area. Do not wash directly on the incision for 2 weeks. Remove the glue after 2 weeks.   Driving Restrictions   Complete by:  As directed    No driving for 2 weeks. May ride in the car locally now. May begin to drive locally in 2 weeks.   Other Restrictions   Complete by:  As directed    Walk gradually increasing distances out in the fresh air at least twice a day. Walking additional 6 times  inside the house, gradually increasing distances, daily. No bending, lifting, or twisting. Perform activities between shoulder and waist height (that is at counter height when standing or table height when sitting).     Allergies as of 05/19/2018      Reactions   Gentamycin [gentamicin] Hives, Itching   Turmeric Other (See Comments)   Cardiac arrythmias   Bacitracin Rash   Hydrocodone Rash   Moxifloxacin Rash   pt had severe red rash- he thought from Avelox Rx....   Neosporin [neomycin-bacitracin Zn-polymyx] Rash   Other Other (See Comments)   Redness  bandaide   Promethazine Other (See Comments)   Restless legs   Tape Dermatitis   bandaid-skin reaction   Triamcinolone Acetonide Itching, Rash         Medication List    STOP taking these medications   FIRST-DUKES MOUTHWASH Susp     TAKE these medications   ALPRAZolam 0.5 MG tablet Commonly known as:  XANAX Take 1/2 to 1 tablet by mouth three times daily as needed for anxiety   amLODipine 5 MG tablet Commonly known as:  NORVASC Take 5 mg by mouth daily.   celecoxib 200 MG capsule Commonly known as:  CELEBREX Take 200 mg by mouth daily.   colchicine 0.6 MG tablet Take 1 tablet (0.6 mg total) by mouth 2 (two) times daily.   cyclobenzaprine 10 MG tablet Commonly known as:  FLEXERIL Take 0.5 tablets (5 mg total) by mouth 3 (three) times daily  as needed for muscle spasms.   DULoxetine 20 MG capsule Commonly known as:  CYMBALTA Take 20 mg by mouth daily.   montelukast 10 MG tablet Commonly known as:  SINGULAIR Take 1 tablet (10 mg total) by mouth at bedtime.   pantoprazole 20 MG tablet Commonly known as:  PROTONIX Take 20 mg by mouth daily. What changed:  Another medication with the same name was removed. Continue taking this medication, and follow the directions you see here.   potassium chloride SA 20 MEQ tablet Commonly known as:  K-DUR,KLOR-CON Take 1 tablet (20 mEq total) by mouth daily.   quinapril 10  MG tablet Commonly known as:  ACCUPRIL Take 20 mg by mouth at bedtime.   RESTASIS 0.05 % ophthalmic emulsion Generic drug:  cycloSPORINE Place 1 drop into both eyes 2 (two) times daily.   rosuvastatin 10 MG tablet Commonly known as:  CRESTOR Take 1 tablet (10 mg total) by mouth at bedtime.   traMADol 50 MG tablet Commonly known as:  ULTRAM Take 1-2 tablets (50-100 mg total) by mouth every 6 (six) hours as needed (mild pain). What changed:  how much to take   triamterene-hydrochlorothiazide 37.5-25 MG tablet Commonly known as:  MAXZIDE-25 Take 1 tablet by mouth every morning.   UROXATRAL 10 MG 24 hr tablet Generic drug:  alfuzosin Take 10 mg by mouth daily with breakfast.            Discharge Care Instructions  (From admission, onward)        Start     Ordered   05/19/18 0000  Discharge wound care:    Comments:  Leave the wound open to air. Shower daily with the wound uncovered. Water and soapy water should run over the incision area. Do not wash directly on the incision for 2 weeks. Remove the glue after 2 weeks.   05/19/18 1220       Signed: Hosie Spangle 05/19/2018, 12:21 PM

## 2018-05-19 NOTE — Progress Notes (Signed)
Patient alert and oriented, mae's well, voiding adequate amount of urine, swallowing without difficulty, no c/o pain at time of discharge. Patient discharged home with family. Script and discharged instructions given to patient. Patient and family stated understanding of instructions given. Patient has an appointment with Dr. Nudelman 

## 2018-05-25 ENCOUNTER — Telehealth: Payer: Self-pay | Admitting: Internal Medicine

## 2018-05-25 ENCOUNTER — Ambulatory Visit (INDEPENDENT_AMBULATORY_CARE_PROVIDER_SITE_OTHER): Payer: Medicare Other | Admitting: Cardiovascular Disease

## 2018-05-25 ENCOUNTER — Encounter: Payer: Self-pay | Admitting: Cardiovascular Disease

## 2018-05-25 VITALS — BP 148/86 | HR 119 | Ht 69.0 in | Wt 167.2 lb

## 2018-05-25 DIAGNOSIS — R000 Tachycardia, unspecified: Secondary | ICD-10-CM | POA: Diagnosis not present

## 2018-05-25 DIAGNOSIS — I10 Essential (primary) hypertension: Secondary | ICD-10-CM

## 2018-05-25 MED ORDER — QUINAPRIL HCL 20 MG PO TABS: 20.0000 mg | ORAL_TABLET | Freq: Every day | ORAL | 3 refills | Status: DC

## 2018-05-25 MED ORDER — METOPROLOL TARTRATE 25 MG PO TABS: 25.0000 mg | ORAL_TABLET | Freq: Two times a day (BID) | ORAL | 3 refills | Status: DC

## 2018-05-25 MED ORDER — AMLODIPINE BESYLATE 10 MG PO TABS: 10.0000 mg | ORAL_TABLET | Freq: Every day | ORAL | 3 refills | Status: DC

## 2018-05-25 NOTE — Telephone Encounter (Signed)
Spoke with pt who has concerns with elevated BP and HR after he had a recent cervical spine surgery. Spoke with Dr Johnsie Cancel, DOD who agreed to see pt in office today.

## 2018-05-25 NOTE — Progress Notes (Signed)
Cardiology Office Note   Date:  05/25/2018   ID:  Peter Bachelor, MD, DOB 12-07-1945, MRN [id number]  PCP:  Noralee Space, MD  Cardiologist:   [name], MD   No chief complaint on file.     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 Referred by Dr Lenna Gilford. Peter [name] is well known to me being one of the original [facility]. He is in general healthy with lots of arthritic issues from pseudogout. History of HTN on low dose norvasc and lisinopril. One week post cervical neck surgery by Dr Sherwood Gambler  Last 48 hours has had elevated BP and pulse. 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 Pain control has been good No excess ETOH , salt or stimulants .No acute renal issues but indicates Cr jumped from .75 to 1.2 last year Compliant with meds and has increased his norvasc to 10 mg and lisinopril to 20 mg on his own today    Past Medical History:  Diagnosis Date  . Allergic rhinitis   . Anxiety   . Cancer [facility])    Melanoma -peri umbilical '02 or '03- no further problems. 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   . Chest pain    Nuclear 2005, normal / coronary CTA 2008, slight mixed plaque, coronary calcium score 0.65, ejection fraction 55%, echo, March, 2008  . Complication of anesthesia    itching-not sure if oxycodone or anesthesia,and shakes. Right TMJ issue  . Dizziness    Dizziness with question of presyncope April 08, 2011  . DJD (degenerative joint disease)    history DDD, fingers, knees, shoulders  . Ejection fraction    EF 55%, echo, March, 2008.  . Family history of Alzheimer's disease   . GERD (gastroesophageal reflux disease)   . H/O hiatal hernia   . Hearing impaired    bilateral hearing aids  . Heart murmur    72 yrs old rheumatic fever   . Heart murmur 2008   trivial tricusp regurg  . History of colonic  polyps   . History of melanoma   . History of pseudogout   . History of recent steroid use    10-10-15 tapering steroid use for tx. recent bronchitis.  Marland Kitchen History of rheumatic fever   . HNP (herniated nucleus pulposus), cervical   . Hypercholesteremia    borderline  . Hypertension   . Lumbar spondylosis with myelopathy 06/09/2013  . Spondylosis   . Tinnitus    chronic  . TMJ click    right"was aggravated with last intubation"    Past Surgical History:  Procedure Laterality Date  . ANTERIOR CERVICAL DECOMP/DISCECTOMY FUSION  2003  . ANTERIOR CERVICAL DECOMP/DISCECTOMY FUSION N/A 05/18/2018   Procedure: ANTERIOR CERVICAL DECOMPRESSION/DISCECTOMY FUSION - CERVICAL FIVE-CERVICAL SIX, removal of tether plate;  Surgeon: Jovita Gamma, MD;  Location: [place];  Service: Neurosurgery;  Laterality: N/A;  . BACK SURGERY    . CARPAL TUNNEL RELEASE Right 10/31/2013   Procedure: RIGHT CARPAL TUNNEL RELEASE;  Surgeon: Cammie Sickle., MD;  Location: [place];  Service: Orthopedics;  Laterality: Right;  . CARPAL TUNNEL RELEASE Left 11/28/2013   Procedure: LEFT CARPAL TUNNEL RELEASE;  Surgeon: Cammie Sickle., MD;  Location: [place];  Service: Orthopedics;  Laterality: Left;  .  CERVICAL DISCECTOMY     anterior with patellar allograft and plating  . JOINT REPLACEMENT    . LUMBAR FUSION  12/14  . MELANOMA EXCISION WITH SENTINEL LYMPH NODE BIOPSY  8/02   bilat ing node bx  . ROTATOR CUFF REPAIR Left    08/13/11  08/27/2011  . TOTAL KNEE ARTHROPLASTY Right 10/07/2015   Procedure: RIGHT TOTAL KNEE ARTHROPLASTY, with synovial tissue specimen;  Surgeon: [name], MD;  Location: [facility];  Service: Orthopedics;  Laterality: Right;     Current Outpatient Medications  Medication Sig Dispense Refill  . alfuzosin (UROXATRAL) 10 MG 24 hr tablet Take 10 mg by mouth daily with breakfast.    . ALPRAZolam (XANAX) 0.5 MG tablet Take 1/2 to 1 tablet by mouth three  times daily as needed for anxiety 90 tablet 5  . amLODipine (NORVASC) 10 MG tablet Take 1 tablet (10 mg total) by mouth daily. 90 tablet 3  . colchicine 0.6 MG tablet Take 1 tablet (0.6 mg total) by mouth 2 (two) times daily. 180 tablet 3  . cyclobenzaprine (FLEXERIL) 10 MG tablet Take 10 mg by mouth as directed.    . cycloSPORINE (RESTASIS) 0.05 % ophthalmic emulsion Place 1 drop into both eyes 2 (two) times daily.    . DULoxetine (CYMBALTA) 20 MG capsule Take 20 mg by mouth daily.    . montelukast (SINGULAIR) 10 MG tablet Take 1 tablet (10 mg total) by mouth at bedtime. 30 tablet 11  . pantoprazole (PROTONIX) 20 MG tablet Take 20 mg by mouth daily.    . potassium chloride SA (K-DUR,KLOR-CON) 20 MEQ tablet Take 1 tablet (20 mEq total) by mouth daily. 90 tablet 3  . quinapril (ACCUPRIL) 20 MG tablet Take 1 tablet (20 mg total) by mouth daily. 90 tablet 3  . rosuvastatin (CRESTOR) 10 MG tablet Take 1 tablet (10 mg total) by mouth at bedtime. 90 tablet 3  . traMADol (ULTRAM) 50 MG tablet Take by mouth as directed.    . triamterene-hydrochlorothiazide (MAXZIDE-25) 37.5-25 MG tablet Take 1 tablet by mouth every morning.    . metoprolol tartrate (LOPRESSOR) 25 MG tablet Take 1 tablet (25 mg total) by mouth 2 (two) times daily. 180 tablet 3   No current facility-administered medications for this visit.     Allergies:   Gentamycin [gentamicin]; Turmeric; Bacitracin; Hydrocodone; Moxifloxacin; Neosporin [neomycin-bacitracin zn-polymyx]; Other; Promethazine; Tape; and Triamcinolone acetonide    Social History:  The patient  reports that he has never smoked. He has never used smokeless tobacco. He reports that he drinks about 1.2 - 2.4 oz of alcohol per week. He reports that he does not use drugs.   Family History:  The patient's family history includes Alzheimer's disease in his father and mother.    ROS:  Please see the history of present illness.   Otherwise, review of systems are positive for  none.   All other systems are reviewed and negative.    PHYSICAL EXAM: VS:  BP (!) 148/86   Pulse (!) 119   Ht 5\' 9"  (1.753 m)   Wt 167 lb 4 oz (75.9 kg)   SpO2 96%   BMI 24.70 kg/m  , BMI Body mass index is 24.7 kg/m. Affect appropriate Healthy:  appears stated age 58: normal Neck supple with no adenopathy JVP normal no bruits no thyromegaly recent anterior cervicale neck surgery healing well Lungs clear with no wheezing and good diaphragmatic motion Heart:  S1/S2 no murmur, no rub, gallop or click PMI  normal Abdomen: benighn, BS positve, no tenderness, no AAA no bruit.  No HSM or HJR Distal pulses intact with no bruits No edema Neuro non-focal Skin warm and dry No muscular weakness Post right TKR     EKG:  ST 103 normal no LVH    Recent Labs: 05/11/2018: BUN 20; Creatinine, Ser 1.21; Hemoglobin 15.0; Platelets 272; Potassium 4.6; Sodium 140    Lipid Panel    Component Value Date/Time   CHOL 228 (H) 04/10/2011 0741   TRIG 53.0 04/10/2011 0741   HDL 57.70 04/10/2011 0741   CHOLHDL 4 04/10/2011 0741   VLDL 10.6 04/10/2011 0741   LDLCALC 104 (H) 07/16/2008 0736   LDLDIRECT 151.6 04/10/2011 0741      Wt Readings from Last 3 Encounters:  05/25/18 167 lb 4 oz (75.9 kg)  05/18/18 170 lb (77.1 kg)  02/23/18 168 lb 6.4 oz (76.4 kg)      Other studies Reviewed: Additional studies/ records that were reviewed today include: Notes DR Lenna Gilford and [name] TTE 2016 operative note a week ago [place] .    ASSESSMENT AND PLAN:  1.  HTN:  Seems like his autonomic nervous system was reset during spinal surgery and appears to have more sympathetic tone. Add lopressor 25 bid and agree with increased doses of calcium blocker and ACE  F/U 3 weeks if still elevated can think about renal duplex and collection for pheochromocytoma  2. Spine:  Has lower back and multiple ortho issues with recent neck fusion Avoid Celebrex that he has been on for year for pain due to recent rise  in Cr  3. HLD continue statin labs with primary    Current medicines are reviewed at length with the patient today.  The patient does not have concerns regarding medicines.  The following changes have been made:  Lopressor 25 bid increase calcium blocker and ACE  Labs/ tests ordered today include: None   Orders Placed This Encounter  Procedures  . EKG 12-Lead     Disposition:   FU with me in 3 weeks      Signed, [name], MD  05/25/2018 3:48 PM    [facility] [address], [place], [place]  [address] Phone: [phone]; Fax: [phone]

## 2018-05-25 NOTE — Patient Instructions (Addendum)
Medication Instructions:  Your physician has recommended you make the following change in your medication:  1-START Metoprolol 25 mg by mouth twice daily. 2-INCREASE Amlodipine 10 mg by mouth daily 3-INCREASE Quinapril 20 mg by mouth daily  Labwork: NONE  Testing/Procedures: NONE  Follow-Up: Your physician wants you to follow-up in: 3 weeks with Dr. Johnsie Cancel.   If you need a refill on your cardiac medications before your next appointment, please call your pharmacy.

## 2018-05-25 NOTE — Telephone Encounter (Signed)
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 States he does not want to see his PCP wants to discuss with Heart[facility]. Please call

## 2018-06-06 ENCOUNTER — Other Ambulatory Visit: Payer: Medicare Other

## 2018-06-06 DIAGNOSIS — M5030 Other cervical disc degeneration, unspecified cervical region: Secondary | ICD-10-CM | POA: Diagnosis not present

## 2018-06-06 DIAGNOSIS — Z981 Arthrodesis status: Secondary | ICD-10-CM | POA: Diagnosis not present

## 2018-06-06 DIAGNOSIS — G629 Polyneuropathy, unspecified: Secondary | ICD-10-CM | POA: Diagnosis not present

## 2018-06-06 DIAGNOSIS — M5441 Lumbago with sciatica, right side: Secondary | ICD-10-CM | POA: Diagnosis not present

## 2018-06-06 DIAGNOSIS — M47812 Spondylosis without myelopathy or radiculopathy, cervical region: Secondary | ICD-10-CM | POA: Diagnosis not present

## 2018-06-06 DIAGNOSIS — G8929 Other chronic pain: Secondary | ICD-10-CM | POA: Diagnosis not present

## 2018-06-06 DIAGNOSIS — M5442 Lumbago with sciatica, left side: Secondary | ICD-10-CM | POA: Diagnosis not present

## 2018-06-06 DIAGNOSIS — M48062 Spinal stenosis, lumbar region with neurogenic claudication: Secondary | ICD-10-CM | POA: Diagnosis not present

## 2018-06-06 DIAGNOSIS — M5020 Other cervical disc displacement, unspecified cervical region: Secondary | ICD-10-CM | POA: Diagnosis not present

## 2018-06-13 NOTE — Progress Notes (Signed)
Cardiology Office Note   Date:  06/14/2018   ID:  Peter Bachelor, MD, DOB 1946/03/27, MRN [id number]  PCP:  Noralee Space, MD  Cardiologist:   [name], MD   No chief complaint on file.     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 Seen for worsening tachycardia and HTN after cervical neck surgery by Dr Elza Rafter July 2019 Lots of arthritic issues from pseudogout.  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 Last visit lopressor added and calcium blocker and ACE doses increased.   Pain control has been good No excess ETOH , salt or stimulants .No acute renal issues but indicates Cr jumped from .75 to 1.2 last year Compliant with meds and has increased his norvasc to 10 mg and lisinopril to 20 mg before he saw me in July and we added beta blocker  Only echo done 06/03/15 EF 65-70% no LVH normal valves   No issues BP better weaning meds back to preop ones on his own    Past Medical History:  Diagnosis Date  . Allergic rhinitis   . Anxiety   . Cancer [facility])    Melanoma -peri umbilical '02 or '03- no further problems. 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   . Chest pain    Nuclear 2005, normal / coronary CTA 2008, slight mixed plaque, coronary calcium score 0.65, ejection fraction 55%, echo, March, 2008  . Complication of anesthesia    itching-not sure if oxycodone or anesthesia,and shakes. Right TMJ issue  . Dizziness    Dizziness with question of presyncope April 08, 2011  . DJD (degenerative joint disease)    history DDD, fingers, knees, shoulders  . Ejection fraction    EF 55%, echo, March, 2008.  . Family history of Alzheimer's disease   . GERD (gastroesophageal reflux disease)   . H/O hiatal hernia   . Hearing impaired    bilateral hearing aids  . Heart murmur    72 yrs old rheumatic fever   . Heart murmur 2008   trivial tricusp regurg  . History of colonic polyps   . History of melanoma   . History of  pseudogout   . History of recent steroid use    10-10-15 tapering steroid use for tx. recent bronchitis.  Marland Kitchen History of rheumatic fever   . HNP (herniated nucleus pulposus), cervical   . Hypercholesteremia    borderline  . Hypertension   . Lumbar spondylosis with myelopathy 06/09/2013  . Spondylosis   . Tinnitus    chronic  . TMJ click    right"was aggravated with last intubation"    Past Surgical History:  Procedure Laterality Date  . ANTERIOR CERVICAL DECOMP/DISCECTOMY FUSION  2003  . ANTERIOR CERVICAL DECOMP/DISCECTOMY FUSION N/A 05/18/2018   Procedure: ANTERIOR CERVICAL DECOMPRESSION/DISCECTOMY FUSION - CERVICAL FIVE-CERVICAL SIX, removal of tether plate;  Surgeon: Jovita Gamma, MD;  Location: [place];  Service: Neurosurgery;  Laterality: N/A;  . BACK SURGERY    . CARPAL TUNNEL RELEASE Right 10/31/2013   Procedure: RIGHT CARPAL TUNNEL RELEASE;  Surgeon: Cammie Sickle., MD;  Location: [place];  Service: Orthopedics;  Laterality: Right;  . CARPAL TUNNEL RELEASE Left 11/28/2013   Procedure: LEFT CARPAL TUNNEL RELEASE;  Surgeon: Cammie Sickle., MD;  Location: [place];  Service: Orthopedics;  Laterality: Left;  . CERVICAL DISCECTOMY     anterior  with patellar allograft and plating  . JOINT REPLACEMENT    . LUMBAR FUSION  12/14  . MELANOMA EXCISION WITH SENTINEL LYMPH NODE BIOPSY  8/02   bilat ing node bx  . ROTATOR CUFF REPAIR Left    08/13/11  08/27/2011  . TOTAL KNEE ARTHROPLASTY Right 10/07/2015   Procedure: RIGHT TOTAL KNEE ARTHROPLASTY, with synovial tissue specimen;  Surgeon: [name], MD;  Location: [facility];  Service: Orthopedics;  Laterality: Right;     Current Outpatient Medications  Medication Sig Dispense Refill  . alfuzosin (UROXATRAL) 10 MG 24 hr tablet Take 10 mg by mouth daily with breakfast.    . ALPRAZolam (XANAX) 0.5 MG tablet Take 1/2 to 1 tablet by mouth three times daily as needed for anxiety 90 tablet 5  .  amLODipine (NORVASC) 5 MG tablet Take 5 mg by mouth as directed.    . colchicine 0.6 MG tablet Take 1 tablet (0.6 mg total) by mouth 2 (two) times daily. 180 tablet 3  . cyclobenzaprine (FLEXERIL) 10 MG tablet Take 10 mg by mouth as directed.    . cycloSPORINE (RESTASIS) 0.05 % ophthalmic emulsion Place 1 drop into both eyes 2 (two) times daily.    . DULoxetine (CYMBALTA) 20 MG capsule Take 20 mg by mouth daily.    . metoprolol tartrate (LOPRESSOR) 25 MG tablet Take 25 mg by mouth as directed. Patient states that he alters how he takes this..wants to eventually diminish    . montelukast (SINGULAIR) 10 MG tablet Take 1 tablet (10 mg total) by mouth at bedtime. 30 tablet 11  . pantoprazole (PROTONIX) 20 MG tablet Take 20 mg by mouth daily.    . potassium chloride SA (K-DUR,KLOR-CON) 20 MEQ tablet Take 1 tablet (20 mEq total) by mouth daily. 90 tablet 3  . quinapril (ACCUPRIL) 10 MG tablet Take 10 mg by mouth as directed. Cuts in half to make 15    . rosuvastatin (CRESTOR) 10 MG tablet Take 1 tablet (10 mg total) by mouth at bedtime. 90 tablet 3  . traMADol (ULTRAM) 50 MG tablet Take by mouth as directed.    . triamterene-hydrochlorothiazide (MAXZIDE-25) 37.5-25 MG tablet Take 1 tablet by mouth every morning.     No current facility-administered medications for this visit.     Allergies:   Gentamycin [gentamicin]; Turmeric; Bacitracin; Hydrocodone; Moxifloxacin; Neosporin [neomycin-bacitracin zn-polymyx]; Other; Promethazine; Tape; and Triamcinolone acetonide    Social History:  The patient  reports that he has never smoked. He has never used smokeless tobacco. He reports that he drinks about 2.0 - 4.0 standard drinks of alcohol per week. He reports that he does not use drugs.   Family History:  The patient's family history includes Alzheimer's disease in his father and mother.    ROS:  Please see the history of present illness.   Otherwise, review of systems are positive for none.   All other  systems are reviewed and negative.    PHYSICAL EXAM: VS:  BP 120/70   Pulse 74   Ht 5\' 9"  (1.753 m)   Wt 172 lb 8 oz (78.2 kg)   SpO2 99%   BMI 25.47 kg/m  , BMI Body mass index is 25.47 kg/m. Affect appropriate Healthy:  appears stated age 50: normal Neck supple with no adenopathy JVP normal no bruits no thyromegaly recent anterior cervicale neck surgery healing well Lungs clear with no wheezing and good diaphragmatic motion Heart:  S1/S2 no murmur, no rub, gallop or click PMI normal Abdomen:  benighn, BS positve, no tenderness, no AAA no bruit.  No HSM or HJR Distal pulses intact with no bruits No edema Neuro non-focal Skin warm and dry No muscular weakness Post right TKR     EKG:  ST 103 normal no LVH    Recent Labs: 05/11/2018: BUN 20; Creatinine, Ser 1.21; Hemoglobin 15.0; Platelets 272; Potassium 4.6; Sodium 140    Lipid Panel    Component Value Date/Time   CHOL 228 (H) 04/10/2011 0741   TRIG 53.0 04/10/2011 0741   HDL 57.70 04/10/2011 0741   CHOLHDL 4 04/10/2011 0741   VLDL 10.6 04/10/2011 0741   LDLCALC 104 (H) 07/16/2008 0736   LDLDIRECT 151.6 04/10/2011 0741      Wt Readings from Last 3 Encounters:  06/14/18 172 lb 8 oz (78.2 kg)  05/25/18 167 lb 4 oz (75.9 kg)  05/18/18 170 lb (77.1 kg)      Other studies Reviewed: Additional studies/ records that were reviewed today include: Notes DR Lenna Gilford and [name] TTE 2016 operative note a week ago [place] .    ASSESSMENT AND PLAN:  1.  HTN:  Improved now post op he is weaning his own medicine and suspect he will be back on old Regimen of 5 mg norvasc and 10 mg quinapril in a couple of weeks  2. Spine:  Has lower back and multiple ortho issues with recent neck fusion Avoid Celebrex that he has been on for year for pain due to recent rise in Cr   3. HLD continue statin labs with primary    Current medicines are reviewed at length with the patient today.  The patient does not have concerns  regarding medicines.  The following changes have been made: patient adjusting BP meds on own    Labs/ tests ordered today include: none   No orders of the defined types were placed in this encounter.    Disposition:   FU with cardiology PRN     Signed, [name], MD  06/14/2018 11:46 AM    [facility] [place], [place],   [address] Phone: [phone]; Fax: [phone]

## 2018-06-14 ENCOUNTER — Encounter: Payer: Self-pay | Admitting: Cardiovascular Disease

## 2018-06-14 ENCOUNTER — Ambulatory Visit (INDEPENDENT_AMBULATORY_CARE_PROVIDER_SITE_OTHER): Payer: Medicare Other | Admitting: Cardiovascular Disease

## 2018-06-14 VITALS — BP 120/70 | HR 74 | Ht 69.0 in | Wt 172.5 lb

## 2018-06-14 DIAGNOSIS — I10 Essential (primary) hypertension: Secondary | ICD-10-CM

## 2018-06-14 DIAGNOSIS — E785 Hyperlipidemia, unspecified: Secondary | ICD-10-CM | POA: Diagnosis not present

## 2018-06-14 NOTE — Patient Instructions (Addendum)
Medication Instructions:  Your physician recommends that you continue on your current medications as directed. Please refer to the Current Medication list given to you today.  Labwork: NONE  Testing/Procedures: NONE  Follow-Up: Your physician wants you to follow-up as needed Dr. Nishan.   If you need a refill on your cardiac medications before your next appointment, please call your pharmacy.    

## 2018-07-05 ENCOUNTER — Other Ambulatory Visit: Payer: Self-pay | Admitting: *Deleted

## 2018-07-05 MED ORDER — AMLODIPINE BESYLATE 5 MG PO TABS: 5.0000 mg | ORAL_TABLET | ORAL | 1 refills | Status: DC

## 2018-07-06 ENCOUNTER — Other Ambulatory Visit: Payer: Self-pay | Admitting: *Deleted

## 2018-07-06 ENCOUNTER — Other Ambulatory Visit: Payer: Self-pay | Admitting: Pulmonary Disease

## 2018-07-06 MED ORDER — ROSUVASTATIN CALCIUM 10 MG PO TABS: 10.0000 mg | ORAL_TABLET | Freq: Every day | ORAL | 3 refills | Status: DC

## 2018-07-15 ENCOUNTER — Ambulatory Visit
Admission: RE | Admit: 2018-07-15 | Discharge: 2018-07-15 | Disposition: A | Discharge status: Home / Self Care | Payer: Medicare Other | Source: Ambulatory Visit | Attending: Neurosurgery | Admitting: Neurosurgery

## 2018-07-15 ENCOUNTER — Other Ambulatory Visit: Payer: Self-pay | Admitting: Neurosurgery

## 2018-07-15 DIAGNOSIS — M47816 Spondylosis without myelopathy or radiculopathy, lumbar region: Secondary | ICD-10-CM | POA: Diagnosis not present

## 2018-07-15 DIAGNOSIS — M5416 Radiculopathy, lumbar region: Secondary | ICD-10-CM

## 2018-07-15 MED ORDER — METHYLPREDNISOLONE ACETATE 40 MG/ML INJ SUSP (RADIOLOG
120.0000 mg | Freq: Once | INTRAMUSCULAR | Status: AC
  Administered 2018-07-15: 120 mg via EPIDURAL

## 2018-07-15 MED ORDER — IOPAMIDOL (ISOVUE-M 200) INJECTION 41%
1.0000 mL | Freq: Once | INTRAMUSCULAR | Status: AC
  Administered 2018-07-15: 1 mL via EPIDURAL

## 2018-07-15 NOTE — Discharge Instructions (Signed)

## 2018-07-29 DIAGNOSIS — M4726 Other spondylosis with radiculopathy, lumbar region: Secondary | ICD-10-CM | POA: Diagnosis not present

## 2018-07-29 DIAGNOSIS — S129XXA Fracture of neck, unspecified, initial encounter: Secondary | ICD-10-CM | POA: Diagnosis not present

## 2018-07-29 DIAGNOSIS — M48062 Spinal stenosis, lumbar region with neurogenic claudication: Secondary | ICD-10-CM | POA: Diagnosis not present

## 2018-07-29 DIAGNOSIS — G629 Polyneuropathy, unspecified: Secondary | ICD-10-CM | POA: Diagnosis not present

## 2018-07-29 DIAGNOSIS — M5136 Other intervertebral disc degeneration, lumbar region: Secondary | ICD-10-CM | POA: Diagnosis not present

## 2018-07-29 DIAGNOSIS — M5442 Lumbago with sciatica, left side: Secondary | ICD-10-CM | POA: Diagnosis not present

## 2018-07-29 DIAGNOSIS — Z981 Arthrodesis status: Secondary | ICD-10-CM | POA: Diagnosis not present

## 2018-08-12 ENCOUNTER — Other Ambulatory Visit: Payer: Self-pay | Admitting: *Deleted

## 2018-08-12 ENCOUNTER — Encounter: Payer: Self-pay | Admitting: Pulmonary Disease

## 2018-08-12 ENCOUNTER — Other Ambulatory Visit: Payer: Self-pay | Admitting: Pulmonary Disease

## 2018-08-12 DIAGNOSIS — I10 Essential (primary) hypertension: Secondary | ICD-10-CM

## 2018-08-12 MED ORDER — DULOXETINE HCL 20 MG PO CPEP: 20.0000 mg | ORAL_CAPSULE | Freq: Two times a day (BID) | ORAL | 0 refills | Status: DC

## 2018-08-26 ENCOUNTER — Other Ambulatory Visit (INDEPENDENT_AMBULATORY_CARE_PROVIDER_SITE_OTHER): Payer: Medicare Other

## 2018-08-26 DIAGNOSIS — I10 Essential (primary) hypertension: Secondary | ICD-10-CM | POA: Diagnosis not present

## 2018-08-26 LAB — COMPREHENSIVE METABOLIC PANEL
ALT: 40 U/L (ref 0–53)
AST: 28 U/L (ref 0–37)
Albumin: 4.6 g/dL (ref 3.5–5.2)
Alkaline Phosphatase: 89 U/L (ref 39–117)
BUN: 23 mg/dL (ref 6–23)
CO2: 31 mEq/L (ref 19–32)
Calcium: 9.8 mg/dL (ref 8.4–10.5)
Chloride: 101 mEq/L (ref 96–112)
Creatinine, Ser: 1.19 mg/dL (ref 0.40–1.50)
GFR: 64.14 mL/min (ref >60.00)
Glucose, Bld: 104 mg/dL — ABNORMAL HIGH (ref 70–99)
Potassium: 4.9 mEq/L (ref 3.5–5.1)
Sodium: 140 mEq/L (ref 135–145)
Total Bilirubin: 0.6 mg/dL (ref 0.2–1.2)
Total Protein: 7.2 g/dL (ref 6.0–8.3)

## 2018-09-02 DIAGNOSIS — M5136 Other intervertebral disc degeneration, lumbar region: Secondary | ICD-10-CM | POA: Diagnosis not present

## 2018-09-02 DIAGNOSIS — M4726 Other spondylosis with radiculopathy, lumbar region: Secondary | ICD-10-CM | POA: Diagnosis not present

## 2018-09-02 DIAGNOSIS — E559 Vitamin D deficiency, unspecified: Secondary | ICD-10-CM | POA: Diagnosis not present

## 2018-09-02 DIAGNOSIS — I10 Essential (primary) hypertension: Secondary | ICD-10-CM | POA: Diagnosis not present

## 2018-09-02 DIAGNOSIS — M5442 Lumbago with sciatica, left side: Secondary | ICD-10-CM | POA: Diagnosis not present

## 2018-09-02 DIAGNOSIS — M48062 Spinal stenosis, lumbar region with neurogenic claudication: Secondary | ICD-10-CM | POA: Diagnosis not present

## 2018-09-19 ENCOUNTER — Telehealth: Payer: Self-pay | Admitting: *Deleted

## 2018-09-19 DIAGNOSIS — M160 Bilateral primary osteoarthritis of hip: Secondary | ICD-10-CM | POA: Diagnosis not present

## 2018-09-19 DIAGNOSIS — S46012A Strain of muscle(s) and tendon(s) of the rotator cuff of left shoulder, initial encounter: Secondary | ICD-10-CM | POA: Diagnosis not present

## 2018-09-19 DIAGNOSIS — S46011A Strain of muscle(s) and tendon(s) of the rotator cuff of right shoulder, initial encounter: Secondary | ICD-10-CM | POA: Diagnosis not present

## 2018-09-19 DIAGNOSIS — M1712 Unilateral primary osteoarthritis, left knee: Secondary | ICD-10-CM | POA: Diagnosis not present

## 2018-09-19 DIAGNOSIS — M545 Low back pain: Secondary | ICD-10-CM | POA: Diagnosis not present

## 2018-09-19 NOTE — Telephone Encounter (Signed)
   [facility] Pre-operative Risk Assessment    Request for surgical clearance:  1. What type of surgery is being performed? LEFT TOTAL KNEE   2. When is this surgery scheduled? 11/08/18   3. Are there any medications that need to be held prior to surgery and how long? PT NOT ON ANY ANTICOAG   4. Practice name and name of physician performing surgery? [facility]; DR. Vonna Kotyk [place]   5. What is your office phone and fax number? PH# [phone]; FAX# [phone]   3. Anesthesia type (None, local, MAC, general) ? GENERAL   Julaine Hua 09/19/2018, 4:30 PM  _________________________________________________________________   (provider comments below)

## 2018-09-20 NOTE — Telephone Encounter (Signed)
   Call back staff: Marland Kitchen Clearance letter has been faxed to the requesting surgeon. . Please contact the surgeon's office to ensure it has been received. . This phone note will be removed from the preop pool. . Please sign encounter when completed.  Richardson Dopp, PA-C    09/20/2018 3:38 PM

## 2018-09-20 NOTE — Telephone Encounter (Signed)
Requesting surgeon's office has receive clearance letter.

## 2018-09-20 NOTE — Telephone Encounter (Signed)
   Primary Cardiologist: [name], MD  Chart reviewed as part of pre-operative protocol coverage.   72 yo male with hx of HTN, tachycardia.  Prior Coronary CT with mild plaque.  He was last seen by Dr. Johnsie Cancel in 8/19. RCRI:  0.4%.  I contacted him today.  He is doing well without chest pain, shortness of breath.    He may proceed with surgery at acceptable risk.  Richardson Dopp, PA-C 09/20/2018, 3:37 PM

## 2018-09-21 ENCOUNTER — Other Ambulatory Visit: Payer: Self-pay | Admitting: Pulmonary Disease

## 2018-09-25 ENCOUNTER — Other Ambulatory Visit: Payer: Self-pay | Admitting: Pulmonary Disease

## 2018-09-27 ENCOUNTER — Other Ambulatory Visit: Payer: Self-pay | Admitting: Neurosurgery

## 2018-09-27 DIAGNOSIS — M545 Low back pain, unspecified: Secondary | ICD-10-CM

## 2018-09-27 DIAGNOSIS — M4726 Other spondylosis with radiculopathy, lumbar region: Secondary | ICD-10-CM | POA: Diagnosis not present

## 2018-09-27 DIAGNOSIS — M5442 Lumbago with sciatica, left side: Secondary | ICD-10-CM | POA: Diagnosis not present

## 2018-09-27 DIAGNOSIS — M5136 Other intervertebral disc degeneration, lumbar region: Secondary | ICD-10-CM | POA: Diagnosis not present

## 2018-09-27 DIAGNOSIS — M48062 Spinal stenosis, lumbar region with neurogenic claudication: Secondary | ICD-10-CM | POA: Diagnosis not present

## 2018-09-27 DIAGNOSIS — S129XXA Fracture of neck, unspecified, initial encounter: Secondary | ICD-10-CM | POA: Diagnosis not present

## 2018-09-28 ENCOUNTER — Ambulatory Visit
Admission: RE | Admit: 2018-09-28 | Discharge: 2018-09-28 | Disposition: A | Discharge status: Home / Self Care | Payer: Medicare Other | Source: Ambulatory Visit | Attending: Neurosurgery | Admitting: Neurosurgery

## 2018-09-28 DIAGNOSIS — M48062 Spinal stenosis, lumbar region with neurogenic claudication: Secondary | ICD-10-CM

## 2018-09-28 DIAGNOSIS — M48061 Spinal stenosis, lumbar region without neurogenic claudication: Secondary | ICD-10-CM | POA: Diagnosis not present

## 2018-09-28 DIAGNOSIS — M5127 Other intervertebral disc displacement, lumbosacral region: Secondary | ICD-10-CM | POA: Diagnosis not present

## 2018-09-28 MED ORDER — GADOBENATE DIMEGLUMINE 529 MG/ML IV SOLN
16.0000 mL | Freq: Once | INTRAVENOUS | Status: AC | PRN
  Administered 2018-09-28: 16 mL via INTRAVENOUS

## 2018-10-04 ENCOUNTER — Other Ambulatory Visit: Payer: Self-pay | Admitting: *Deleted

## 2018-10-04 DIAGNOSIS — I10 Essential (primary) hypertension: Secondary | ICD-10-CM | POA: Diagnosis not present

## 2018-10-04 DIAGNOSIS — M5136 Other intervertebral disc degeneration, lumbar region: Secondary | ICD-10-CM | POA: Diagnosis not present

## 2018-10-04 DIAGNOSIS — M5442 Lumbago with sciatica, left side: Secondary | ICD-10-CM | POA: Diagnosis not present

## 2018-10-04 DIAGNOSIS — M4726 Other spondylosis with radiculopathy, lumbar region: Secondary | ICD-10-CM | POA: Diagnosis not present

## 2018-10-04 DIAGNOSIS — Z6826 Body mass index (BMI) 26.0-26.9, adult: Secondary | ICD-10-CM | POA: Diagnosis not present

## 2018-10-04 DIAGNOSIS — M47812 Spondylosis without myelopathy or radiculopathy, cervical region: Secondary | ICD-10-CM | POA: Diagnosis not present

## 2018-10-04 DIAGNOSIS — S32009K Unspecified fracture of unspecified lumbar vertebra, subsequent encounter for fracture with nonunion: Secondary | ICD-10-CM | POA: Diagnosis not present

## 2018-10-04 DIAGNOSIS — M48062 Spinal stenosis, lumbar region with neurogenic claudication: Secondary | ICD-10-CM | POA: Diagnosis not present

## 2018-10-04 MED ORDER — QUINAPRIL HCL 10 MG PO TABS: 10.0000 mg | ORAL_TABLET | ORAL | 2 refills | Status: DC

## 2018-10-05 ENCOUNTER — Other Ambulatory Visit: Payer: Self-pay | Admitting: Neurosurgery

## 2018-10-10 ENCOUNTER — Ambulatory Visit (INDEPENDENT_AMBULATORY_CARE_PROVIDER_SITE_OTHER): Payer: Medicare Other | Admitting: Psychiatry

## 2018-10-10 DIAGNOSIS — F4322 Adjustment disorder with anxiety: Secondary | ICD-10-CM

## 2018-10-10 NOTE — Progress Notes (Signed)
[facility] Initial Adult Exam- Part I  Name: Peter HOMAN, MD Date: 10/10/2018 MRN: [id number] DOB: August 11, 1946 PCP: Noralee Space, MD  Time spent: 80 minutes   Guardian/Payee: Patient   Paperwork requested:  No   Reason for Visit /Presenting Problem: No chief complaint on file.   Mental Status Exam:   Appearance:   Well Groomed     Behavior:  Appropriate and Sharing  Motor:  Normal  Speech/Language:   Clear and Coherent  Affect:  anxious  Mood:  anxious  Thought process:  normal  Thought content:    WNL  Sensory/Perceptual disturbances:    WNL  Orientation:  oriented to person, place, time/date, situation, day of week, month of year and year  Attention:  Good  Concentration:  Good  Memory:  WNL  Fund of knowledge:   Good  Insight:    Good  Judgment:   Good  Impulse Control:  Good   Reported Symptoms:  Anxiety, apprehension; indecision on important issue  Risk Assessment: Danger to Self:  No Self-injurious Behavior: No Danger to Others: No Duty to Warn:no Physical Aggression / Violence:No  Access to Firearms a concern: No  Gang Involvement:No  Patient / guardian was educated about steps to take if suicide or homicide risk level increases between visits: n/a While future psychiatric events cannot be accurately predicted, the patient does not currently require acute inpatient psychiatric care and does not currently meet [facility] involuntary commitment criteria.  Substance Abuse History: Current substance abuse: No     Past Psychiatric History:   No previous psychological problems have been observed Outpatient Providers: N/A History of Psych Hospitalization: No  Psychological Testing: none reported    Medical History/Surgical History: Reviewed with no changes except has a back surgery coming up mid-January 2020 , possibly 11/10/2018. Past Medical History:  Diagnosis Date  . Allergic rhinitis   . Anxiety   . Cancer [facility])    Melanoma -peri  umbilical '02 or '03- no further problems. 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   . Chest pain    Nuclear 2005, normal / coronary CTA 2008, slight mixed plaque, coronary calcium score 0.65, ejection fraction 55%, echo, March, 2008  . Complication of anesthesia    itching-not sure if oxycodone or anesthesia,and shakes. Right TMJ issue  . Dizziness    Dizziness with question of presyncope April 08, 2011  . DJD (degenerative joint disease)    history DDD, fingers, knees, shoulders  . Ejection fraction    EF 55%, echo, March, 2008.  . Family history of Alzheimer's disease   . GERD (gastroesophageal reflux disease)   . H/O hiatal hernia   . Hearing impaired    bilateral hearing aids  . Heart murmur    72 yrs old rheumatic fever   . Heart murmur 2008   trivial tricusp regurg  . History of colonic polyps   . History of melanoma   . History of pseudogout   . History of recent steroid use    10-10-15 tapering steroid use for tx. recent bronchitis.  Marland Kitchen History of rheumatic fever   . HNP (herniated nucleus pulposus), cervical   . Hypercholesteremia    borderline  . Hypertension   . Lumbar spondylosis with myelopathy 06/09/2013  . Spondylosis   . Tinnitus    chronic  . TMJ click    right"was aggravated with last intubation"    Past Surgical History:  Procedure Laterality Date  . ANTERIOR CERVICAL DECOMP/DISCECTOMY FUSION  2003  . ANTERIOR CERVICAL DECOMP/DISCECTOMY FUSION N/A 05/18/2018   Procedure: ANTERIOR CERVICAL DECOMPRESSION/DISCECTOMY FUSION - CERVICAL FIVE-CERVICAL SIX, removal of tether plate;  Surgeon: Jovita Gamma, MD;  Location: [place];  Service: Neurosurgery;  Laterality: N/A;  . BACK SURGERY    . CARPAL TUNNEL RELEASE Right 10/31/2013   Procedure: RIGHT CARPAL TUNNEL RELEASE;  Surgeon: Cammie Sickle., MD;  Location: [place];  Service: Orthopedics;  Laterality: Right;  . CARPAL TUNNEL RELEASE Left 11/28/2013   Procedure: LEFT CARPAL TUNNEL  RELEASE;  Surgeon: Cammie Sickle., MD;  Location: [place];  Service: Orthopedics;  Laterality: Left;  . CERVICAL DISCECTOMY     anterior with patellar allograft and plating  . JOINT REPLACEMENT    . LUMBAR FUSION  12/14  . MELANOMA EXCISION WITH SENTINEL LYMPH NODE BIOPSY  8/02   bilat ing node bx  . ROTATOR CUFF REPAIR Left    08/13/11  08/27/2011  . TOTAL KNEE ARTHROPLASTY Right 10/07/2015   Procedure: RIGHT TOTAL KNEE ARTHROPLASTY, with synovial tissue specimen;  Surgeon: [name], MD;  Location: [facility];  Service: Orthopedics;  Laterality: Right;    Medications: Current Outpatient Medications  Medication Sig Dispense Refill  . alfuzosin (UROXATRAL) 10 MG 24 hr tablet Take 10 mg by mouth daily with breakfast.    . ALPRAZolam (XANAX) 0.5 MG tablet Take 1/2 to 1 tablet by mouth three times daily as needed for anxiety 90 tablet 5  . amLODipine (NORVASC) 5 MG tablet Take 1 tablet (5 mg total) by mouth as directed. 90 tablet 1  . colchicine 0.6 MG tablet TAKE 1 TABLET (0.6 MG TOTAL) BY MOUTH 2 (TWO) TIMES DAILY 180 tablet 1  . cyclobenzaprine (FLEXERIL) 10 MG tablet Take 10 mg by mouth as directed.    . cycloSPORINE (RESTASIS) 0.05 % ophthalmic emulsion Place 1 drop into both eyes 2 (two) times daily.    . DULoxetine (CYMBALTA) 20 MG capsule Take 1 capsule (20 mg total) by mouth 2 (two) times daily. 360 capsule 0  . montelukast (SINGULAIR) 10 MG tablet Take 1 tablet (10 mg total) by mouth at bedtime. 30 tablet 11  . pantoprazole (PROTONIX) 20 MG tablet Take 20 mg by mouth daily.    . potassium chloride SA (K-DUR,KLOR-CON) 20 MEQ tablet Take 1 tablet (20 mEq total) by mouth daily. 90 tablet 3  . propranolol (INDERAL) 20 MG tablet Take 20 mg by mouth as needed (1/2 to 1 tab as needed).    . quinapril (ACCUPRIL) 10 MG tablet Take 1 tablet (10 mg total) by mouth as directed. Cuts in half to make 15 90 tablet 2  . rosuvastatin (CRESTOR) 10 MG tablet Take 1 tablet (10  mg total) by mouth at bedtime. 90 tablet 3  . traMADol (ULTRAM) 50 MG tablet Take by mouth as directed.    . triamterene-hydrochlorothiazide (MAXZIDE-25) 37.5-25 MG tablet Take 1 tablet by mouth every morning.     No current facility-administered medications for this visit.     Allergies  Allergen Reactions  . Gentamycin [Gentamicin] Hives and Itching  . Turmeric Other (See Comments)    Cardiac arrythmias  . Bacitracin Rash  . Hydrocodone Rash  . Moxifloxacin Rash    pt had severe red rash- he thought from Avelox Rx....  . Neosporin [Neomycin-Bacitracin Zn-Polymyx] Rash  . Other Other (See Comments)    Redness  bandaide  . Promethazine Other (See Comments)    Restless legs  .  Tape Dermatitis    bandaid-skin reaction  . Triamcinolone Acetonide Itching and Rash          Diagnoses:    ICD-10-CM   1. Adjustment disorder with anxious mood F43.22      Abuse History: Victim: none Report needed: no Perpetrator of abuse: no Witness / Exposure to Domestic Violence:  none Protective Services Involvement: no Witness to [employer] Violence:  no   Family / Social History:    Living situation: lives with wife of 40+ yrs.  Adult son, wife, and 3 children in [place].  Adult daughter and her 2 children living in [place], [place].   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  Educational History:   Through medical school  Religion/Sprituality/World View:  Jewish  Any cultural differences that may affect / interfere with treatment:  no  Recreation/Hobbies: enjoys times with family and friends, loves to travel as able  Stressors:  Recent/current decision re: making a big  move  Strengths:  Sense of humor, intelligent, good support network  Barriers: none reported, except indecision  Legal History: none reported  Pending legal issue / charges: none  History of legal issue / charges: none   Diagnosis:  F43.22   Adjustment Disorder with Anxious Mood  GOALS: Patient will take strategies we discussed in session today and work towards making best decision possible on an issue that is very important to his future and family.  Knows that once he makes decision , he needs to not be looking back and second-guessing himself as that serves to increase his anxiety.     Peter Ace, LCSW

## 2018-10-14 ENCOUNTER — Telehealth: Payer: Self-pay | Admitting: Pulmonary Disease

## 2018-10-14 NOTE — Telephone Encounter (Signed)
Discussed w/ DrBarry the need for the following written med refills: Uroxatrol (generic) 10mg  #90- one tab po Qd w/ 3 refiils... Amlodipine 5mg  #90- one tab po Qd w/ 3 refills... Colchicine 0.6mg  #180- one tab po Bid w/ 3 refills... Singulair (generic) 10mg  #90- one tab po Qd w/ 3 refills... Pantoprazole 40mg  #90- one tab po Qd as directed w/ 3 refills... KCl-ER 81mEq tabs #90- one tab po Qd as directed w/ 3 refills... Accupril (generic) 10mg  #90- one tab po Qd w/ 3 refills... Crestor (generic) 10mg  #90- one tab po Qd w/ 3 refills... Maxzide-25 (generic) #90- one tab po Qd w/ 3 refills... Tramadol 50mg  #120- one tab po Qid prn pain w/ 5 refills.Marland KitchenMarland Kitchen

## 2018-10-24 DIAGNOSIS — L111 Transient acantholytic dermatosis [Grover]: Secondary | ICD-10-CM | POA: Diagnosis not present

## 2018-10-24 DIAGNOSIS — Z85820 Personal history of malignant melanoma of skin: Secondary | ICD-10-CM | POA: Diagnosis not present

## 2018-10-24 DIAGNOSIS — L821 Other seborrheic keratosis: Secondary | ICD-10-CM | POA: Diagnosis not present

## 2018-10-24 DIAGNOSIS — Z85828 Personal history of other malignant neoplasm of skin: Secondary | ICD-10-CM | POA: Diagnosis not present

## 2018-10-24 DIAGNOSIS — L570 Actinic keratosis: Secondary | ICD-10-CM | POA: Diagnosis not present

## 2018-10-24 DIAGNOSIS — D485 Neoplasm of uncertain behavior of skin: Secondary | ICD-10-CM | POA: Diagnosis not present

## 2018-10-28 ENCOUNTER — Other Ambulatory Visit: Payer: Self-pay | Admitting: Neurosurgery

## 2018-11-01 ENCOUNTER — Other Ambulatory Visit (HOSPITAL_COMMUNITY): Payer: Medicare Other

## 2018-11-01 ENCOUNTER — Telehealth: Payer: Self-pay | Admitting: Adult Health

## 2018-11-01 NOTE — Pre-Procedure Instructions (Signed)
[name], MD  11/01/2018      [name] - [place], [place] - [place] [address] [place] [address] Phone: [phone] Fax: [phone]  [id number] - [place], [place] [place] [address] Phone: [phone] Fax: [phone]  [place], [place] - [address] [place] [address] Phone: [phone] Fax: [phone]  [id number] [place], [place] - [place] [address] [place] [address] Phone: [phone] Fax: [phone]    Your procedure is scheduled on November 10, 2018.  Report to [facility] Admitting at 530 AM.  Call this number if you have problems the morning of surgery:  [phone]   Remember:  Do not eat or drink after midnight.    Take these medicines the morning of surgery with A SIP OF WATER  Alfuzosin (Uroxatral) Amlodipine (norvasc) Colchincine Restasis eye drops Duloxetine (Cymbalta) Gabapentin (neurontin) Pantoprazole (protonix) Tramadol (ultram)-if needed  Cyclobenzaprine (flexeril)-if needed  7 days prior to surgery STOP taking any Aspirin (unless otherwise instructed by your surgeon), Aleve, Naproxen, Ibuprofen, Motrin, Advil, Goody's, BC's, all herbal medications, fish oil, and all vitamins   Do not wear jewelry  Do not wear lotions, powders, or colognes, or deodorant.  Men may shave face and neck.  Do not bring valuables to the hospital.  [facility] is not responsible for any belongings or valuables.  Contacts, dentures or bridgework may not be worn into surgery.  Leave your suitcase in the car.  After surgery it may be brought to your room.  For patients admitted to the hospital, discharge time will be determined by your treatment team.  Patients discharged the day of surgery will not be  allowed to drive home.   [facility]- Preparing For Surgery  Before surgery, you can play an important role. Because skin is not sterile, your skin needs to be as free of germs as possible. You can reduce the number of germs on your skin by washing with CHG (chlorahexidine gluconate) Soap before surgery.  CHG is an antiseptic cleaner which kills germs and bonds with the skin to continue killing germs even after washing.    Oral Hygiene is also important to reduce your risk of infection.  Remember - BRUSH YOUR TEETH THE MORNING OF SURGERY WITH YOUR REGULAR TOOTHPASTE  Please do not use if you have an allergy to CHG or antibacterial soaps. If your skin becomes reddened/irritated stop using the CHG.  Do not shave (including legs and underarms) for at least 48 hours prior to first CHG shower. It is OK to shave your face.  Please follow these instructions carefully.   1. Shower the NIGHT BEFORE SURGERY and the MORNING OF SURGERY with CHG.   2. If you chose to wash your hair, wash your hair first as usual with your normal shampoo.  3. After you shampoo, rinse your hair and body thoroughly to remove the shampoo.  4. Use CHG as you would any other liquid soap. You can apply CHG directly to the skin and wash gently with a scrungie or a clean washcloth.   5. Apply the CHG Soap to your body ONLY FROM THE NECK DOWN.  Do not use on open wounds or open sores. Avoid contact with your eyes, ears, mouth and genitals (private parts). Wash Face and  genitals (private parts)  with your normal soap.  6. Wash thoroughly, paying special attention to the area where your surgery will be performed.  7. Thoroughly rinse your body with warm water from the neck down.  8. DO NOT shower/wash with your normal soap after using and rinsing off the CHG Soap.  9. Pat yourself dry with a CLEAN TOWEL.  10. Wear CLEAN PAJAMAS to bed the night before surgery, wear comfortable clothes the morning of surgery  11. Place CLEAN  SHEETS on your bed the night of your first shower and DO NOT SLEEP WITH PETS.  Day of Surgery:  Do not apply any deodorants/lotions.  Please wear clean clothes to the hospital/surgery center.   Remember to brush your teeth WITH YOUR REGULAR TOOTHPASTE.  Please read over the following fact sheets that you were given.

## 2018-11-01 NOTE — Telephone Encounter (Signed)
[facility]. Unable to reach. Voicemail of surgical coordinator for Dr. Noemi Chapel came up. Message left.

## 2018-11-02 ENCOUNTER — Other Ambulatory Visit (HOSPITAL_COMMUNITY): Payer: Medicare Other

## 2018-11-02 ENCOUNTER — Encounter (HOSPITAL_COMMUNITY)
Admission: RE | Admit: 2018-11-02 | Discharge: 2018-11-02 | Disposition: A | Discharge status: Home / Self Care | Payer: Medicare Other | Source: Ambulatory Visit | Attending: Neurosurgery | Admitting: Neurosurgery

## 2018-11-02 ENCOUNTER — Encounter (HOSPITAL_COMMUNITY): Payer: Self-pay

## 2018-11-02 DIAGNOSIS — Z01812 Encounter for preprocedural laboratory examination: Secondary | ICD-10-CM | POA: Insufficient documentation

## 2018-11-02 LAB — BASIC METABOLIC PANEL
Anion gap: 8 (ref 5–15)
BUN: 20 mg/dL (ref 8–23)
CO2: 28 mmol/L (ref 22–32)
Calcium: 9.6 mg/dL (ref 8.9–10.3)
Chloride: 103 mmol/L (ref 98–111)
Creatinine, Ser: 1.09 mg/dL (ref 0.61–1.24)
GFR calc Af Amer: >60 mL/min (ref >60)
GFR calc non Af Amer: >60 mL/min (ref >60)
Glucose, Bld: 105 mg/dL — ABNORMAL HIGH (ref 70–99)
Potassium: 4.5 mmol/L (ref 3.5–5.1)
Sodium: 139 mmol/L (ref 135–145)

## 2018-11-02 LAB — CBC
HCT: 45.7 % (ref 39.0–52.0)
Hemoglobin: 14.8 g/dL (ref 13.0–17.0)
MCH: 30.3 pg (ref 26.0–34.0)
MCHC: 32.4 g/dL (ref 30.0–36.0)
MCV: 93.5 fL (ref 80.0–100.0)
Platelets: 281 10*3/uL (ref 150–400)
RBC: 4.89 MIL/uL (ref 4.22–5.81)
RDW: 11.9 % (ref 11.5–15.5)
WBC: 6.9 10*3/uL (ref 4.0–10.5)
nRBC: 0 % (ref 0.0–0.2)

## 2018-11-02 LAB — SURGICAL PCR SCREEN
MRSA, PCR: NEGATIVE
Staphylococcus aureus: NEGATIVE

## 2018-11-02 MED ORDER — CHLORHEXIDINE GLUCONATE CLOTH 2 % EX PADS: 6.0000 | MEDICATED_PAD | Freq: Once | CUTANEOUS | Status: DC

## 2018-11-02 NOTE — Telephone Encounter (Signed)
Attempted to call Judeen Hammans, Dr[facility].  Left detailed message that Dr. Lenna Gilford had faxed info for surgical clearance on 10/28/18.  I received surgical clearance fax this morning for Dr. Lenna Gilford to sign and fax back. Will give to Dr. Lenna Gilford to sign and fax back.

## 2018-11-02 NOTE — Telephone Encounter (Signed)
Surgical clearance form signed by Dr. Lenna Gilford.  Signed form and all requested labs, ekg, and information faxed to [facility] attn Claiborne Billings, to [phone]. Called and spoke with Claiborne Billings, at [phone] ext [phone], let her know that I was sending surgical clearance fax at that time, and to call back if she has any questions or needs anything further. I also let her know that Dr. Lenna Gilford mailed a copy on Friday, 10/28/18, so she should receive a copy in the mail. Nothing further at this time.

## 2018-11-03 LAB — TYPE AND SCREEN
ABO/RH(D): O POS
Antibody Screen: NEGATIVE

## 2018-11-03 NOTE — Anesthesia Preprocedure Evaluation (Addendum)
Anesthesia Evaluation  Patient identified by MRN, date of birth, ID band Patient awake    Reviewed: Allergy & Precautions, NPO status , Patient's Chart, lab work & pertinent test results  Airway Mallampati: II  TM Distance: >3 FB     Dental   Pulmonary    breath sounds clear to auscultation       Cardiovascular hypertension,  Rhythm:Regular Rate:Normal     Neuro/Psych    GI/Hepatic hiatal hernia, GERD  ,  Endo/Other    Renal/GU negative Renal ROS     Musculoskeletal   Abdominal   Peds  Hematology   Anesthesia Other Findings   Reproductive/Obstetrics                           Anesthesia Physical Anesthesia Plan  ASA: III  Anesthesia Plan: General   Post-op Pain Management:    Induction: Intravenous  PONV Risk Score and Plan:   Airway Management Planned: Oral ETT  Additional Equipment:   Intra-op Plan:   Post-operative Plan: Possible Post-op intubation/ventilation  Informed Consent: I have reviewed the patients History and Physical, chart, labs and discussed the procedure including the risks, benefits and alternatives for the proposed anesthesia with the patient or authorized representative who has indicated his/her understanding and acceptance.     Dental advisory given  Plan Discussed with: CRNA and Anesthesiologist  Anesthesia Plan Comments: (Retired Stage manager. Follows with Dr. Johnsie Cancel for HTN and tachycardia. Cardiac clearance 09/20/18 by Richardson Dopp, PA-C. EF 65-70% by Echo 2016. )      Anesthesia Quick Evaluation

## 2018-11-08 ENCOUNTER — Inpatient Hospital Stay: Admit: 2018-11-08 | Payer: Medicare Other | Admitting: Orthopedic Surgery

## 2018-11-08 SURGERY — ARTHROPLASTY, KNEE, TOTAL
Anesthesia: Choice | Laterality: Left

## 2018-11-10 ENCOUNTER — Encounter (HOSPITAL_COMMUNITY)
Admission: RE | Disposition: A | Discharge status: Home / Self Care | Payer: Self-pay | Source: Home / Self Care | Attending: Neurosurgery

## 2018-11-10 ENCOUNTER — Inpatient Hospital Stay (HOSPITAL_COMMUNITY): Payer: Medicare Other | Admitting: Physician Assistant

## 2018-11-10 ENCOUNTER — Inpatient Hospital Stay (HOSPITAL_COMMUNITY): Payer: Medicare Other

## 2018-11-10 ENCOUNTER — Inpatient Hospital Stay (HOSPITAL_COMMUNITY): Payer: Medicare Other | Admitting: Certified Registered Nurse Anesthetist

## 2018-11-10 ENCOUNTER — Inpatient Hospital Stay (HOSPITAL_COMMUNITY)
Admission: RE | Admit: 2018-11-10 | Discharge: 2018-11-11 | DRG: 454 | Disposition: A | Discharge status: Home / Self Care | Payer: Medicare Other | Attending: Neurosurgery | Admitting: Neurosurgery

## 2018-11-10 ENCOUNTER — Encounter (HOSPITAL_COMMUNITY): Payer: Self-pay

## 2018-11-10 ENCOUNTER — Other Ambulatory Visit: Payer: Self-pay

## 2018-11-10 DIAGNOSIS — M5136 Other intervertebral disc degeneration, lumbar region: Secondary | ICD-10-CM | POA: Diagnosis present

## 2018-11-10 DIAGNOSIS — F419 Anxiety disorder, unspecified: Secondary | ICD-10-CM | POA: Diagnosis present

## 2018-11-10 DIAGNOSIS — M960 Pseudarthrosis after fusion or arthrodesis: Secondary | ICD-10-CM | POA: Diagnosis present

## 2018-11-10 DIAGNOSIS — Z885 Allergy status to narcotic agent status: Secondary | ICD-10-CM | POA: Diagnosis not present

## 2018-11-10 DIAGNOSIS — M1128 Other chondrocalcinosis, vertebrae: Secondary | ICD-10-CM | POA: Diagnosis present

## 2018-11-10 DIAGNOSIS — T8489XA Other specified complication of internal orthopedic prosthetic devices, implants and grafts, initial encounter: Secondary | ICD-10-CM | POA: Diagnosis present

## 2018-11-10 DIAGNOSIS — Z981 Arthrodesis status: Secondary | ICD-10-CM

## 2018-11-10 DIAGNOSIS — M898X8 Other specified disorders of bone, other site: Secondary | ICD-10-CM | POA: Diagnosis not present

## 2018-11-10 DIAGNOSIS — M21372 Foot drop, left foot: Secondary | ICD-10-CM | POA: Diagnosis present

## 2018-11-10 DIAGNOSIS — M4326 Fusion of spine, lumbar region: Secondary | ICD-10-CM | POA: Diagnosis not present

## 2018-11-10 DIAGNOSIS — E785 Hyperlipidemia, unspecified: Secondary | ICD-10-CM | POA: Diagnosis present

## 2018-11-10 DIAGNOSIS — M48062 Spinal stenosis, lumbar region with neurogenic claudication: Secondary | ICD-10-CM | POA: Diagnosis present

## 2018-11-10 DIAGNOSIS — Z79899 Other long term (current) drug therapy: Secondary | ICD-10-CM

## 2018-11-10 DIAGNOSIS — I10 Essential (primary) hypertension: Secondary | ICD-10-CM | POA: Diagnosis present

## 2018-11-10 DIAGNOSIS — Z96651 Presence of right artificial knee joint: Secondary | ICD-10-CM | POA: Diagnosis present

## 2018-11-10 DIAGNOSIS — M5386 Other specified dorsopathies, lumbar region: Secondary | ICD-10-CM | POA: Diagnosis present

## 2018-11-10 DIAGNOSIS — Z888 Allergy status to other drugs, medicaments and biological substances status: Secondary | ICD-10-CM | POA: Diagnosis not present

## 2018-11-10 DIAGNOSIS — M47816 Spondylosis without myelopathy or radiculopathy, lumbar region: Secondary | ICD-10-CM | POA: Diagnosis present

## 2018-11-10 DIAGNOSIS — G629 Polyneuropathy, unspecified: Secondary | ICD-10-CM | POA: Diagnosis present

## 2018-11-10 DIAGNOSIS — Z91048 Other nonmedicinal substance allergy status: Secondary | ICD-10-CM

## 2018-11-10 DIAGNOSIS — Z419 Encounter for procedure for purposes other than remedying health state, unspecified: Secondary | ICD-10-CM

## 2018-11-10 DIAGNOSIS — M21371 Foot drop, right foot: Secondary | ICD-10-CM | POA: Diagnosis present

## 2018-11-10 DIAGNOSIS — K219 Gastro-esophageal reflux disease without esophagitis: Secondary | ICD-10-CM | POA: Diagnosis present

## 2018-11-10 DIAGNOSIS — M5186 Other intervertebral disc disorders, lumbar region: Secondary | ICD-10-CM | POA: Diagnosis not present

## 2018-11-10 SURGERY — POSTERIOR LUMBAR FUSION 1 LEVEL
Anesthesia: General | Site: Back

## 2018-11-10 MED ORDER — BUPIVACAINE HCL (PF) 0.5 % IJ SOLN
INTRAMUSCULAR | Status: DC | PRN
  Administered 2018-11-10: 15 mL

## 2018-11-10 MED ORDER — TRAMADOL HCL 50 MG PO TABS
50.0000 mg | ORAL_TABLET | Freq: Four times a day (QID) | ORAL | Status: DC | PRN
  Administered 2018-11-10 – 2018-11-11 (×2): 50 mg via ORAL
  Filled 2018-11-10: qty 1

## 2018-11-10 MED ORDER — TRIAMTERENE-HCTZ 37.5-25 MG PO TABS
1.0000 | ORAL_TABLET | Freq: Every morning | ORAL | Status: DC
  Filled 2018-11-10 (×2): qty 1

## 2018-11-10 MED ORDER — MAGNESIUM HYDROXIDE 400 MG/5ML PO SUSP: 30.0000 mL | Freq: Every day | ORAL | Status: DC | PRN

## 2018-11-10 MED ORDER — ACETAMINOPHEN 650 MG RE SUPP: 650.0000 mg | RECTAL | Status: DC | PRN

## 2018-11-10 MED ORDER — EPHEDRINE 5 MG/ML INJ
INTRAVENOUS | Status: AC
  Filled 2018-11-10: qty 10

## 2018-11-10 MED ORDER — COLCHICINE 0.6 MG PO TABS
0.6000 mg | ORAL_TABLET | Freq: Two times a day (BID) | ORAL | Status: DC
  Administered 2018-11-10: 0.6 mg via ORAL
  Filled 2018-11-10 (×3): qty 1

## 2018-11-10 MED ORDER — LIDOCAINE HCL (CARDIAC) PF 100 MG/5ML IV SOSY
PREFILLED_SYRINGE | INTRAVENOUS | Status: DC | PRN
  Administered 2018-11-10: 40 mg via INTRAVENOUS

## 2018-11-10 MED ORDER — 0.9 % SODIUM CHLORIDE (POUR BTL) OPTIME
TOPICAL | Status: DC | PRN
  Administered 2018-11-10 (×2): 1000 mL

## 2018-11-10 MED ORDER — LISINOPRIL 10 MG PO TABS: 10.0000 mg | ORAL_TABLET | Freq: Every day | ORAL | Status: DC

## 2018-11-10 MED ORDER — TRAMADOL HCL 50 MG PO TABS
ORAL_TABLET | ORAL | Status: AC
  Filled 2018-11-10: qty 1

## 2018-11-10 MED ORDER — ACETAMINOPHEN 10 MG/ML IV SOLN
INTRAVENOUS | Status: DC | PRN
  Administered 2018-11-10: 1000 mg via INTRAVENOUS

## 2018-11-10 MED ORDER — KETOROLAC TROMETHAMINE 30 MG/ML IJ SOLN
15.0000 mg | Freq: Once | INTRAMUSCULAR | Status: AC
  Administered 2018-11-10: 15 mg via INTRAVENOUS

## 2018-11-10 MED ORDER — LIDOCAINE 2% (20 MG/ML) 5 ML SYRINGE
INTRAMUSCULAR | Status: AC
  Filled 2018-11-10: qty 5

## 2018-11-10 MED ORDER — PROPOFOL 10 MG/ML IV BOLUS
INTRAVENOUS | Status: DC | PRN
  Administered 2018-11-10: 150 mg via INTRAVENOUS

## 2018-11-10 MED ORDER — HYDROMORPHONE HCL 2 MG PO TABS
2.0000 mg | ORAL_TABLET | ORAL | Status: DC | PRN
  Administered 2018-11-10: 2 mg via ORAL
  Filled 2018-11-10: qty 1

## 2018-11-10 MED ORDER — MENTHOL 3 MG MT LOZG: 1.0000 | LOZENGE | OROMUCOSAL | Status: DC | PRN

## 2018-11-10 MED ORDER — ACETAMINOPHEN 10 MG/ML IV SOLN
INTRAVENOUS | Status: AC
  Filled 2018-11-10: qty 100

## 2018-11-10 MED ORDER — CEFAZOLIN SODIUM 1 G IJ SOLR
INTRAMUSCULAR | Status: AC
  Filled 2018-11-10: qty 20

## 2018-11-10 MED ORDER — ALFUZOSIN HCL ER 10 MG PO TB24
10.0000 mg | ORAL_TABLET | Freq: Every day | ORAL | Status: DC
  Filled 2018-11-10 (×2): qty 1

## 2018-11-10 MED ORDER — LACTATED RINGERS IV SOLN
INTRAVENOUS | Status: DC | PRN
  Administered 2018-11-10 (×2): via INTRAVENOUS

## 2018-11-10 MED ORDER — CYCLOBENZAPRINE HCL 10 MG PO TABS
10.0000 mg | ORAL_TABLET | Freq: Three times a day (TID) | ORAL | Status: DC | PRN
  Administered 2018-11-10: 10 mg via ORAL
  Filled 2018-11-10 (×2): qty 1

## 2018-11-10 MED ORDER — FENTANYL CITRATE (PF) 250 MCG/5ML IJ SOLN
INTRAMUSCULAR | Status: AC
  Filled 2018-11-10: qty 5

## 2018-11-10 MED ORDER — THROMBIN 20000 UNITS EX SOLR
CUTANEOUS | Status: DC | PRN
  Administered 2018-11-10: 07:00:00 via TOPICAL

## 2018-11-10 MED ORDER — SODIUM CHLORIDE 0.9 % IV SOLN
INTRAVENOUS | Status: DC | PRN
  Administered 2018-11-10: 15 ug/min via INTRAVENOUS
  Administered 2018-11-10: 60 ug/min via INTRAVENOUS

## 2018-11-10 MED ORDER — SUGAMMADEX SODIUM 500 MG/5ML IV SOLN
INTRAVENOUS | Status: AC
  Filled 2018-11-10: qty 5

## 2018-11-10 MED ORDER — ACETAMINOPHEN 325 MG PO TABS: 650.0000 mg | ORAL_TABLET | ORAL | Status: DC | PRN

## 2018-11-10 MED ORDER — SODIUM CHLORIDE 0.9 % IV SOLN: 250.0000 mL | INTRAVENOUS | Status: DC

## 2018-11-10 MED ORDER — LACTATED RINGERS IV SOLN
INTRAVENOUS | Status: DC | PRN
  Administered 2018-11-10: 08:00:00 via INTRAVENOUS

## 2018-11-10 MED ORDER — ROCURONIUM BROMIDE 100 MG/10ML IV SOLN
INTRAVENOUS | Status: DC | PRN
  Administered 2018-11-10 (×2): 30 mg via INTRAVENOUS
  Administered 2018-11-10: 20 mg via INTRAVENOUS
  Administered 2018-11-10: 50 mg via INTRAVENOUS
  Administered 2018-11-10: 20 mg via INTRAVENOUS

## 2018-11-10 MED ORDER — ALBUMIN HUMAN 5 % IV SOLN
INTRAVENOUS | Status: DC | PRN
  Administered 2018-11-10: 10:00:00 via INTRAVENOUS

## 2018-11-10 MED ORDER — LIDOCAINE-EPINEPHRINE 1 %-1:100000 IJ SOLN
INTRAMUSCULAR | Status: DC | PRN
  Administered 2018-11-10: 15 mL

## 2018-11-10 MED ORDER — KETOROLAC TROMETHAMINE 30 MG/ML IJ SOLN
15.0000 mg | Freq: Four times a day (QID) | INTRAMUSCULAR | Status: DC
  Administered 2018-11-10 – 2018-11-11 (×3): 15 mg via INTRAVENOUS
  Filled 2018-11-10 (×3): qty 1

## 2018-11-10 MED ORDER — HYDROMORPHONE HCL 2 MG PO TABS
1.0000 mg | ORAL_TABLET | ORAL | Status: DC | PRN
  Administered 2018-11-10 (×2): 2 mg via ORAL
  Filled 2018-11-10: qty 1

## 2018-11-10 MED ORDER — THROMBIN 20000 UNITS EX SOLR
CUTANEOUS | Status: AC
  Filled 2018-11-10: qty 20000

## 2018-11-10 MED ORDER — KETOROLAC TROMETHAMINE 30 MG/ML IJ SOLN
INTRAMUSCULAR | Status: AC
  Filled 2018-11-10: qty 1

## 2018-11-10 MED ORDER — AMLODIPINE BESYLATE 5 MG PO TABS: 5.0000 mg | ORAL_TABLET | Freq: Every morning | ORAL | Status: DC

## 2018-11-10 MED ORDER — BISACODYL 10 MG RE SUPP: 10.0000 mg | Freq: Every day | RECTAL | Status: DC | PRN

## 2018-11-10 MED ORDER — CEFAZOLIN SODIUM-DEXTROSE 2-4 GM/100ML-% IV SOLN
2.0000 g | INTRAVENOUS | Status: AC
  Administered 2018-11-10 (×2): 2 g via INTRAVENOUS
  Filled 2018-11-10: qty 100

## 2018-11-10 MED ORDER — ONDANSETRON HCL 4 MG/2ML IJ SOLN
INTRAMUSCULAR | Status: AC
  Filled 2018-11-10: qty 2

## 2018-11-10 MED ORDER — OXYCODONE HCL 5 MG PO TABS
5.0000 mg | ORAL_TABLET | ORAL | Status: DC | PRN
  Filled 2018-11-10: qty 2

## 2018-11-10 MED ORDER — FLEET ENEMA 7-19 GM/118ML RE ENEM: 1.0000 | ENEMA | Freq: Once | RECTAL | Status: DC | PRN

## 2018-11-10 MED ORDER — BUPIVACAINE HCL (PF) 0.5 % IJ SOLN
INTRAMUSCULAR | Status: AC
  Filled 2018-11-10: qty 30

## 2018-11-10 MED ORDER — PANTOPRAZOLE SODIUM 40 MG PO TBEC: 40.0000 mg | DELAYED_RELEASE_TABLET | Freq: Every day | ORAL | Status: DC

## 2018-11-10 MED ORDER — LIDOCAINE-EPINEPHRINE 1 %-1:100000 IJ SOLN
INTRAMUSCULAR | Status: AC
  Filled 2018-11-10: qty 1

## 2018-11-10 MED ORDER — ALUM & MAG HYDROXIDE-SIMETH 200-200-20 MG/5ML PO SUSP: 30.0000 mL | Freq: Four times a day (QID) | ORAL | Status: DC | PRN

## 2018-11-10 MED ORDER — FENTANYL CITRATE (PF) 250 MCG/5ML IJ SOLN
INTRAMUSCULAR | Status: DC | PRN
  Administered 2018-11-10 (×2): 50 ug via INTRAVENOUS
  Administered 2018-11-10: 100 ug via INTRAVENOUS
  Administered 2018-11-10: 50 ug via INTRAVENOUS

## 2018-11-10 MED ORDER — HYDROXYZINE HCL 25 MG PO TABS: 50.0000 mg | ORAL_TABLET | ORAL | Status: DC | PRN

## 2018-11-10 MED ORDER — ROSUVASTATIN CALCIUM 5 MG PO TABS
10.0000 mg | ORAL_TABLET | Freq: Every day | ORAL | Status: DC
  Administered 2018-11-10: 10 mg via ORAL
  Filled 2018-11-10: qty 2

## 2018-11-10 MED ORDER — GABAPENTIN 400 MG PO CAPS
400.0000 mg | ORAL_CAPSULE | Freq: Three times a day (TID) | ORAL | Status: DC
  Administered 2018-11-10 (×2): 400 mg via ORAL
  Filled 2018-11-10 (×2): qty 1

## 2018-11-10 MED ORDER — SODIUM CHLORIDE 0.9% FLUSH
3.0000 mL | Freq: Two times a day (BID) | INTRAVENOUS | Status: DC
  Administered 2018-11-10: 3 mL via INTRAVENOUS

## 2018-11-10 MED ORDER — MORPHINE SULFATE (PF) 4 MG/ML IV SOLN: 4.0000 mg | INTRAVENOUS | Status: DC | PRN

## 2018-11-10 MED ORDER — HYDROXYZINE HCL 50 MG/ML IM SOLN: 50.0000 mg | INTRAMUSCULAR | Status: DC | PRN

## 2018-11-10 MED ORDER — MONTELUKAST SODIUM 10 MG PO TABS
10.0000 mg | ORAL_TABLET | Freq: Every evening | ORAL | Status: DC | PRN
  Filled 2018-11-10: qty 1

## 2018-11-10 MED ORDER — THROMBIN 5000 UNITS EX SOLR
OROMUCOSAL | Status: DC | PRN
  Administered 2018-11-10: 07:00:00 via TOPICAL

## 2018-11-10 MED ORDER — HYDROMORPHONE HCL 2 MG PO TABS: 1.0000 mg | ORAL_TABLET | ORAL | Status: DC | PRN

## 2018-11-10 MED ORDER — MIDAZOLAM HCL 5 MG/5ML IJ SOLN
INTRAMUSCULAR | Status: DC | PRN
  Administered 2018-11-10 (×2): 1 mg via INTRAVENOUS

## 2018-11-10 MED ORDER — ROCURONIUM BROMIDE 50 MG/5ML IV SOSY
PREFILLED_SYRINGE | INTRAVENOUS | Status: AC
  Filled 2018-11-10: qty 15

## 2018-11-10 MED ORDER — EPHEDRINE SULFATE 50 MG/ML IJ SOLN
INTRAMUSCULAR | Status: DC | PRN
  Administered 2018-11-10: 10 mg via INTRAVENOUS
  Administered 2018-11-10 (×2): 5 mg via INTRAVENOUS
  Administered 2018-11-10: 10 mg via INTRAVENOUS
  Administered 2018-11-10 (×4): 5 mg via INTRAVENOUS

## 2018-11-10 MED ORDER — DULOXETINE HCL 20 MG PO CPEP
20.0000 mg | ORAL_CAPSULE | Freq: Every day | ORAL | Status: DC
  Filled 2018-11-10 (×2): qty 1

## 2018-11-10 MED ORDER — CYCLOBENZAPRINE HCL 10 MG PO TABS
ORAL_TABLET | ORAL | Status: AC
  Filled 2018-11-10: qty 1

## 2018-11-10 MED ORDER — SUGAMMADEX SODIUM 200 MG/2ML IV SOLN
INTRAVENOUS | Status: DC | PRN
  Administered 2018-11-10: 161.6 mg via INTRAVENOUS

## 2018-11-10 MED ORDER — CYCLOSPORINE 0.05 % OP EMUL
1.0000 [drp] | Freq: Every day | OPHTHALMIC | Status: DC
  Filled 2018-11-10 (×2): qty 1

## 2018-11-10 MED ORDER — THROMBIN 5000 UNITS EX SOLR
CUTANEOUS | Status: AC
  Filled 2018-11-10: qty 5000

## 2018-11-10 MED ORDER — FENTANYL CITRATE (PF) 100 MCG/2ML IJ SOLN: 25.0000 ug | INTRAMUSCULAR | Status: DC | PRN

## 2018-11-10 MED ORDER — POTASSIUM CHLORIDE CRYS ER 10 MEQ PO TBCR
10.0000 meq | EXTENDED_RELEASE_TABLET | ORAL | Status: DC
  Filled 2018-11-10: qty 1

## 2018-11-10 MED ORDER — SODIUM CHLORIDE 0.9% FLUSH: 3.0000 mL | INTRAVENOUS | Status: DC | PRN

## 2018-11-10 MED ORDER — DEXAMETHASONE SODIUM PHOSPHATE 4 MG/ML IJ SOLN
INTRAMUSCULAR | Status: DC | PRN
  Administered 2018-11-10: 10 mg via INTRAVENOUS

## 2018-11-10 MED ORDER — PROPOFOL 10 MG/ML IV BOLUS
INTRAVENOUS | Status: AC
  Filled 2018-11-10: qty 40

## 2018-11-10 MED ORDER — PHENOL 1.4 % MT LIQD: 1.0000 | OROMUCOSAL | Status: DC | PRN

## 2018-11-10 MED ORDER — ONDANSETRON HCL 4 MG/2ML IJ SOLN
INTRAMUSCULAR | Status: DC | PRN
  Administered 2018-11-10: 4 mg via INTRAVENOUS

## 2018-11-10 MED ORDER — MIDAZOLAM HCL 2 MG/2ML IJ SOLN
INTRAMUSCULAR | Status: AC
  Filled 2018-11-10: qty 2

## 2018-11-10 MED ORDER — PHENYLEPHRINE HCL 10 MG/ML IJ SOLN
INTRAMUSCULAR | Status: AC
  Filled 2018-11-10: qty 1

## 2018-11-10 MED ORDER — DEXAMETHASONE SODIUM PHOSPHATE 10 MG/ML IJ SOLN
INTRAMUSCULAR | Status: AC
  Filled 2018-11-10: qty 1

## 2018-11-10 MED ORDER — DEXTROSE-NACL 5-0.45 % IV SOLN: INTRAVENOUS | Status: DC

## 2018-11-10 SURGICAL SUPPLY — 75 items
ADH SKN CLS APL DERMABOND .7 (GAUZE/BANDAGES/DRESSINGS) ×1
APL SKNCLS STERI-STRIP NONHPOA (GAUZE/BANDAGES/DRESSINGS) ×1
BAG DECANTER FOR FLEXI CONT (MISCELLANEOUS) ×2 IMPLANT
BENZOIN TINCTURE PRP APPL 2/3 (GAUZE/BANDAGES/DRESSINGS) ×2 IMPLANT
BLADE CLIPPER SURG (BLADE) IMPLANT
BUR ACRON 5.0MM COATED (BURR) ×2 IMPLANT
BUR MATCHSTICK NEURO 3.0 LAGG (BURR) ×2 IMPLANT
CAGE POST LUM 11X23X9 6D (Cage) ×2 IMPLANT
CANISTER SUCT 3000ML PPV (MISCELLANEOUS) ×2 IMPLANT
CAP LCK SPNE (Orthopedic Implant) ×8 IMPLANT
CAP LOCK SPINE RADIUS (Orthopedic Implant) IMPLANT
CAP LOCKING (Orthopedic Implant) ×16 IMPLANT
CARTRIDGE OIL MAESTRO DRILL (MISCELLANEOUS) ×1 IMPLANT
CONT SPEC 4OZ CLIKSEAL STRL BL (MISCELLANEOUS) ×2 IMPLANT
COVER BACK TABLE 60X90IN (DRAPES) ×2 IMPLANT
COVER WAND RF STERILE (DRAPES) ×2 IMPLANT
CROSSLINK MEDIUM (Orthopedic Implant) ×2 IMPLANT
DECANTER SPIKE VIAL GLASS SM (MISCELLANEOUS) ×2 IMPLANT
DERMABOND ADVANCED (GAUZE/BANDAGES/DRESSINGS) ×1
DERMABOND ADVANCED .7 DNX12 (GAUZE/BANDAGES/DRESSINGS) ×1 IMPLANT
DIFFUSER DRILL AIR PNEUMATIC (MISCELLANEOUS) ×2 IMPLANT
DRAPE C-ARM 42X72 X-RAY (DRAPES) ×3 IMPLANT
DRAPE C-ARMOR (DRAPES) ×1 IMPLANT
DRAPE HALF SHEET 40X57 (DRAPES) IMPLANT
DRAPE LAPAROTOMY 100X72X124 (DRAPES) ×2 IMPLANT
DRAPE POUCH INSTRU U-SHP 10X18 (DRAPES) ×2 IMPLANT
ELECT REM PT RETURN 9FT ADLT (ELECTROSURGICAL) ×2
ELECTRODE REM PT RTRN 9FT ADLT (ELECTROSURGICAL) ×1 IMPLANT
GAUZE 4X4 16PLY RFD (DISPOSABLE) IMPLANT
GAUZE SPONGE 4X4 12PLY STRL (GAUZE/BANDAGES/DRESSINGS) ×2 IMPLANT
GLOVE BIOGEL PI IND STRL 7.5 (GLOVE) IMPLANT
GLOVE BIOGEL PI IND STRL 8 (GLOVE) ×2 IMPLANT
GLOVE BIOGEL PI INDICATOR 7.5 (GLOVE) ×1
GLOVE BIOGEL PI INDICATOR 8 (GLOVE) ×4
GLOVE ECLIPSE 7.0 STRL STRAW (GLOVE) ×1 IMPLANT
GLOVE ECLIPSE 7.5 STRL STRAW (GLOVE) ×8 IMPLANT
GOWN STRL REUS W/ TWL LRG LVL3 (GOWN DISPOSABLE) IMPLANT
GOWN STRL REUS W/ TWL XL LVL3 (GOWN DISPOSABLE) ×2 IMPLANT
GOWN STRL REUS W/TWL 2XL LVL3 (GOWN DISPOSABLE) ×1 IMPLANT
GOWN STRL REUS W/TWL LRG LVL3 (GOWN DISPOSABLE) ×2
GOWN STRL REUS W/TWL XL LVL3 (GOWN DISPOSABLE) ×4
HEMOSTAT POWDER SURGIFOAM 1G (HEMOSTASIS) ×1 IMPLANT
KIT BASIN OR (CUSTOM PROCEDURE TRAY) ×2 IMPLANT
KIT INFUSE SMALL (Orthopedic Implant) ×1 IMPLANT
KIT TURNOVER KIT B (KITS) ×2 IMPLANT
NDL ASP BONE MRW 8GX15 (NEEDLE) IMPLANT
NDL SPNL 18GX3.5 QUINCKE PK (NEEDLE) ×1 IMPLANT
NDL SPNL 22GX3.5 QUINCKE BK (NEEDLE) ×1 IMPLANT
NEEDLE ASP BONE MRW 8GX15 (NEEDLE) ×2 IMPLANT
NEEDLE SPNL 18GX3.5 QUINCKE PK (NEEDLE) ×2 IMPLANT
NEEDLE SPNL 22GX3.5 QUINCKE BK (NEEDLE) ×2 IMPLANT
NS IRRIG 1000ML POUR BTL (IV SOLUTION) ×3 IMPLANT
OIL CARTRIDGE MAESTRO DRILL (MISCELLANEOUS) ×2
PACK LAMINECTOMY NEURO (CUSTOM PROCEDURE TRAY) ×2 IMPLANT
PAD ARMBOARD 7.5X6 YLW CONV (MISCELLANEOUS) ×6 IMPLANT
PATTIES SURGICAL .5 X.5 (GAUZE/BANDAGES/DRESSINGS) ×1 IMPLANT
PATTIES SURGICAL .5 X1 (DISPOSABLE) IMPLANT
PATTIES SURGICAL 1X1 (DISPOSABLE) ×1 IMPLANT
ROD MAX 90MM (Rod) ×2 IMPLANT
SCREW 6.75X45MM (Screw) ×2 IMPLANT
SCREW 7.75X50MM (Screw) ×2 IMPLANT
SPONGE LAP 4X18 RFD (DISPOSABLE) IMPLANT
SPONGE NEURO XRAY DETECT 1X3 (DISPOSABLE) IMPLANT
SPONGE SURGIFOAM ABS GEL 100 (HEMOSTASIS) ×2 IMPLANT
STRIP BIOACTIVE VITOSS 25X100X (Neuro Prosthesis/Implant) ×2 IMPLANT
SUT VIC AB 1 CT1 18XBRD ANBCTR (SUTURE) ×2 IMPLANT
SUT VIC AB 1 CT1 8-18 (SUTURE) ×6
SUT VIC AB 2-0 CP2 18 (SUTURE) ×5 IMPLANT
SYR 3ML LL SCALE MARK (SYRINGE) IMPLANT
SYR CONTROL 10ML LL (SYRINGE) ×3 IMPLANT
TAPE CLOTH SURG 4X10 WHT LF (GAUZE/BANDAGES/DRESSINGS) ×1 IMPLANT
TOWEL GREEN STERILE (TOWEL DISPOSABLE) ×2 IMPLANT
TOWEL GREEN STERILE FF (TOWEL DISPOSABLE) ×2 IMPLANT
TRAY FOLEY MTR SLVR 16FR STAT (SET/KITS/TRAYS/PACK) ×2 IMPLANT
WATER STERILE IRR 1000ML POUR (IV SOLUTION) ×2 IMPLANT

## 2018-11-10 NOTE — Anesthesia Postprocedure Evaluation (Signed)
Anesthesia Post Note  Patient: Peter Bachelor, MD  Procedure(s) Performed: LUMBAR TWO- LUMBAR THREE DECOMPRESSION, POSTERIOR LUMBAR INTERBODY FUSION, POSTERIO LATERAL ARTHRODESIS; REVISION LUMBAR THREE- LUMBAR FOUR POSTERIOR LATERAL ARTHRODESIS (N/A Back)     Patient location during evaluation: PACU Anesthesia Type: General Level of consciousness: awake Pain management: pain level controlled Vital Signs Assessment: post-procedure vital signs reviewed and stable Respiratory status: spontaneous breathing Cardiovascular status: stable Postop Assessment: no headache Anesthetic complications: no    Last Vitals:  Vitals:   11/10/18 1419 11/10/18 1705  BP: 123/67 136/75  Pulse: 75 70  Resp: 20 16  Temp:    SpO2: 95% 99%    Last Pain:  Vitals:   11/10/18 1600  TempSrc:   PainSc: Asleep                 Dathan Attia

## 2018-11-10 NOTE — Progress Notes (Signed)
Vitals:   11/10/18 1340 11/10/18 1345 11/10/18 1419 11/10/18 1705  BP:  121/77 123/67 136/75  Pulse: 81 84 75 70  Resp: 20 16 20 16   Temp:  97.7 F (36.5 C)    TempSrc:      SpO2: 93% 97% 95% 99%  Weight:      Height:        Patient resting in bed, comfortable.  Has ambulated once in the halls.  Foley DC'd, nursing staff monitoring voiding function.  Dressing clean and dry.  Plan: Doing well following surgery.  Encouraged to ambulate.  Continue to progress through postoperative recovery.  Hosie Spangle, MD 11/10/2018, 5:15 PM

## 2018-11-10 NOTE — Op Note (Signed)
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; lumbar spondylosis; lumbar degenerative disc disease  PROCEDURE:  Procedure(s): 1) partial removal of existing posterior instrumentation including rods, locking caps, cross connector, and L3 pedicle screws bilaterally (existing L4 and L5 screws were left in place); 2) bilateral L2-3 lumbar decompression including laminectomy, facetectomy, and foraminotomies for decompression of the exiting L2 and L3 nerve roots bilaterally, with decompression beyond that required for interbody arthrodesis; bilateral L2-3 posterior lumbar interbody arthrodesis with Tritanium interbody implants, Vitoss BA with bone marrow aspirate, and infuse; bilateral L2-3 posterior lateral arthrodesis with segmental (from L2-L5) radius posterior instrumentation, Vitoss BA with bone marrow aspirate, and infuse; revision of L3-4 nonunion/pseudoarthrosis with posterior lateral arthrodesis with radius posterior instrumentation, Vitoss BA with bone marrow aspirate, and infuse  SURGEON: Jovita Gamma, MD  ASSISTANTS:  Consuella Lose, MD  ANESTHESIA:   general  EBL:  Total I/O In: 1770 [I.V.:1400; Blood:120; IV Piggyback:250] Out: 475 [Urine:175; Blood:300]  BLOOD ADMINISTERED:120 CC CELLSAVER  COUNT:  Correct per nursing staff  SPECIMEN:  Source of Specimen:  Facet capsule, facet joint synovium, and articular process for evaluation of upper scopic crystals for calcium pyrophosphate deposition disease  DICTATION: Patient is brought to the operating room placed under general endotracheal anesthesia. The patient was turned to  prone position the lumbar region was prepped with Betadine soap and solution and draped in a sterile fashion.  The previous midline incision was marked.  The midline was infiltrated with local anesthesia with epinephrine. A midline incision was made through the previous midline incision, and extended rostrally.  It was carried down through the subcutaneous tissue, bipolar cautery and electrocautery were used to maintain hemostasis. Dissection was carried down to the lumbar fascia. The fascia was incised bilaterally at the L2-3 level and the paraspinal muscles were dissected with a spinous process and lamina in a subperiosteal fashion.  The midline fascia over the previous laminectomy was incised, and dissection was carried ventrally and then laterally.  The existing posterior instrumentation including the cross-link at the L4-5 level, and the L3, L4, and L5 screw heads along with the rods on each side connecting them were identified, cleaned of scar tissue, and exposed.  We identified the L2-3 level, including the residual superior aspect of the L3 spinous process and the tire L2 spinous process and lamina.  We continue to clean scar tissue and bone off of the existing posterior instrumentation and then unlocked the central lock of the cross-link, then unlocked the cross-link from the rods and remove the cross link.  We then removed all 6 locking caps, and then removed the rods bilaterally.  The L4 and L5 screws were found to be secure, but both L3 screws were loose and were removed.  The screw holes were examined with a ball probe, good bony surfaces were found.  We then proceeded with the decompression at the L2-3 level.  Dissection was then carried out laterally over the L2-3 facet complex.  There was significant hypertrophic pseudoarthrosis around where the L3 screw heads have been present, and this was removed with double-action rongeurs and the high-speed drill.  We then identified and exposed the transverse  processes of L2, L3, and L4.  These were decorticated.   Laminectomy was  performed using the high-speed drill and Kerrison punches. Dissection was carried out laterally including bilateral L2-3 facetectomy and foraminotomies with decompression of the stenotic compression of the L2 and L3 nerve roots.  As the decompression was performed, we found crystals within the soft tissues including the facet capsules, facet joint synovium, ligamentum flavum, and so on.  Specimens of the tissue, at the patient's request, was sent to pathology for analysis of the crystals for calcium pyrophosphate.  Once the decompression of the stenotic compression of the thecal sac and exiting nerve roots was completed we proceeded with the posterior lumbar interbody arthrodesis. The annulus was incised bilaterally and the disc space entered. A thorough discectomy was performed using pituitary rongeurs and curettes. Once the discectomy was completed we began to prepare the endplate surfaces removing the cartilaginous endplates surface. We then measured the height of the intervertebral disc space. We selected a 10 x 23 x 6 Tritanium interbody implants.  Having examined the screw holes at L3 and having found good bony surfaces, we selected 7.75 x 50 mm screws.  They were placed with C-arm fluoroscopic guidance.  We continued to use the C-arm fluoroscope and identified the pedicle entry points bilaterally at the L2 level. Each of the pedicles was probed, we aspirated bone marrow aspirate from the vertebral bodies, this was injected over two 10 cc strips of Vitoss BA. Then each of the pedicles was examined with the ball probe good bony surfaces were found and no bony cuts were found. Each of the pedicles was then tapped with a 5.25 mm tap, again examined with the ball probe good threading was found and no bony cuts were found. We then placed 6.75 by 45 millimeter screws bilaterally at the L2 level.  We then packed the Tritanium interbody  implants with Vitoss BA with bone marrow aspirate use, and then placed the first implant and on the right side, carefully retracting the thecal sac and nerve root medially. We then went back to the left side and packed the midline with additional Vitoss BA with bone marrow aspirate and then placed a second implant and on the left side again retracting the thecal sac and nerve root medially.   We then packed the lateral gutter over the L2, L3, and L4 transverse processes and intertransverse space with Vitoss BA with bone marrow aspirate and infuse. We then selected 90 mm hyper lordosed rods, they were placed within the screw heads and secured with locking caps once all 8 locking caps were placed final tightening was performed against a counter torque.  We then placed medium cross connectors between the L2 and L3 screws and between the L4 and L5 screws.  They were secured to the rods, and then the central tightening collar was secured down.  The wound had been irrigated multiple times during the procedure with saline solution and bacitracin solution, good hemostasis was established with a combination of bipolar cautery and Gelfoam with thrombin.  The Gelfoam was removed, and a thin layer of Surgifoam applied.  Once good hemostasis was confirmed we proceeded with closure paraspinal muscles deep fascia and Scarpa's fascia were closed with interrupted undyed 1 Vicryl sutures the subcutaneous and subcuticular closed with interrupted inverted 2-0 undyed Vicryl sutures the skin edges were approximated with Dermabond.  The wound was dressed with sterile gauze and Hypafix.  Following surgery the patient was turned back to the supine position to be reversed and the anesthetic extubated and transferred to the recovery room for further care.  PLAN OF CARE: Admit to inpatient   PATIENT DISPOSITION:  PACU - hemodynamically stable.   Delay start of Pharmacological VTE agent (>24hrs) due to surgical blood loss or risk  of bleeding:  yes

## 2018-11-10 NOTE — Anesthesia Procedure Notes (Signed)
Procedure Name: Intubation Date/Time: 11/10/2018 8:14 AM Performed by: Glynda Jaeger, CRNA Pre-anesthesia Checklist: Patient identified, Patient being monitored, Timeout performed, Emergency Drugs available and Suction available Patient Re-evaluated:Patient Re-evaluated prior to induction Oxygen Delivery Method: Circle System Utilized Preoxygenation: Pre-oxygenation with 100% oxygen Induction Type: IV induction Ventilation: Mask ventilation without difficulty Laryngoscope Size: Glidescope and 4 Grade View: Grade I Tube type: Oral Tube size: 7.5 mm Number of attempts: 1 Airway Equipment and Method: Stylet Placement Confirmation: ETT inserted through vocal cords under direct vision,  positive ETCO2 and breath sounds checked- equal and bilateral Secured at: 24 cm Tube secured with: Tape Dental Injury: Teeth and Oropharynx as per pre-operative assessment  Comments: Intubated by Fransisco Beau MD. Elective glidescope, per patient request

## 2018-11-10 NOTE — Transfer of Care (Signed)
Immediate Anesthesia Transfer of Care Note  Patient: Peter Bachelor, MD  Procedure(s) Performed: LUMBAR TWO- LUMBAR THREE DECOMPRESSION, POSTERIOR LUMBAR INTERBODY FUSION, POSTERIO LATERAL ARTHRODESIS; REVISION LUMBAR THREE- LUMBAR FOUR POSTERIOR LATERAL ARTHRODESIS (N/A Back)  Patient Location: PACU  Anesthesia Type:General  Level of Consciousness: awake, alert , patient cooperative and responds to stimulation  Airway & Oxygen Therapy: Patient Spontanous Breathing and Patient connected to face mask oxygen  Post-op Assessment: Report given to RN, Post -op Vital signs reviewed and stable and Patient moving all extremities X 4  Post vital signs: Reviewed and stable  Last Vitals:  Vitals Value Taken Time  BP 141/72 11/10/2018 12:43 PM  Temp    Pulse 92 11/10/2018 12:46 PM  Resp 17 11/10/2018 12:46 PM  SpO2 97 % 11/10/2018 12:46 PM  Vitals shown include unvalidated device data.  Last Pain:  Vitals:   11/10/18 0603  TempSrc:   PainSc: 0-No pain      Patients Stated Pain Goal: 3 (57/50/51 [id number])  Complications: No apparent anesthesia complications

## 2018-11-10 NOTE — H&P (Signed)
Subjective: Patient is a 73 y.o. right-handed white male who is admitted for treatment of multifactorial lumbar stenosis at the L2-3 level with progressive neurogenic claudication, and development of bilateral foot drops.  He is status post previous L3-4 and L4-5 lumbar decompression and arthrodesis, with good fusion at the L4-5 level but evidence of nonunion pseudoarthrosis at L3-4.  He is admitted now for L2-3 lumbar decompression including laminectomy, facetectomy, and foraminotomies, and L2-3 stabilization via bilateral posterior lumbar interbody arthrodesis with interbody implants and bone graft and bilateral posterior lateral arthrodesis with posterior instrumentation and bone graft.  We also plan to revise the L3-4 posterior lateral arthrodesis.   Patient Active Problem List   Diagnosis Date Noted  . HNP (herniated nucleus pulposus), cervical 05/18/2018  . Arthrodesis status 05/08/2016  . Polyneuropathy 05/08/2016  . Other intervertebral disc degeneration, lumbar region 05/08/2016  . Spinal stenosis of lumbar region 05/08/2016  . Anesthesia of skin 05/08/2016  . OA (osteoarthritis) of knee 10/07/2015  . Encounter for immunization 09/03/2015  . Lesion of skeletal muscle 04/22/2015  . HLD (hyperlipidemia) 02/15/2014  . Lumbar stenosis with neurogenic claudication 10/02/2013  . Lumbosacral spinal stenosis 06/09/2013  . Lumbar spondylosis with myelopathy 06/09/2013  . Tear film insufficiency, unspecified 06/05/2013  . Thoracic or lumbosacral neuritis or radiculitis, unspecified 06/05/2013  . Inflammation of shoulder joint 10/09/2011  . History of repair of left rotator cuff 10/09/2011  . Need for diphtheria-tetanus-pertussis (Tdap) vaccine, adult/adolescent 04/13/2011  . Chest pain   . Dizziness   . Physical exam, annual 03/26/2011  . Osteoarthritis 06/21/2010  . Pseudogout involving multiple joints 06/20/2010  . HIP PAIN 06/20/2010  . HYPERCHOLESTEROLEMIA, BORDERLINE 07/19/2008  .  TINNITUS, CHRONIC 07/19/2008  . RHEUMATIC FEVER 07/19/2008  . Allergic rhinitis 07/19/2008  . GERD 07/19/2008  . SPONDYLOSIS 07/19/2008  . COLONIC POLYPS, HX OF 07/19/2008  . Melanoma of skin ([place]) 01/11/2008  . Essential hypertension 01/11/2008   Past Medical History:  Diagnosis Date  . Allergic rhinitis   . Anxiety   . Cancer [facility])    Melanoma -peri umbilical '02 or '03- no further problems. 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   . Chest pain    Nuclear 2005, normal / coronary CTA 2008, slight mixed plaque, coronary calcium score 0.65, ejection fraction 55%, echo, March, 2008  . Complication of anesthesia    itching-not sure if oxycodone or anesthesia,and shakes. Right TMJ issue  . Dizziness    Dizziness with question of presyncope April 08, 2011  . DJD (degenerative joint disease)    history DDD, fingers, knees, shoulders  . Ejection fraction    EF 55%, echo, March, 2008.  . Family history of Alzheimer's disease   . GERD (gastroesophageal reflux disease)   . H/O hiatal hernia   . Hearing impaired    bilateral hearing aids  . Heart murmur    73 yrs old rheumatic fever   . Heart murmur 2008   trivial tricusp regurg  . History of colonic polyps   . History of melanoma   . History of pseudogout   . History of recent steroid use    10-10-15 tapering steroid use for tx. recent bronchitis.  Marland Kitchen History of rheumatic fever   . HNP (herniated nucleus pulposus), cervical   . Hypercholesteremia    borderline  . Hypertension   . Lumbar spondylosis with myelopathy 06/09/2013  . Spondylosis   . Tinnitus    chronic  . TMJ click    right"was aggravated with  last intubation"    Past Surgical History:  Procedure Laterality Date  . ANTERIOR CERVICAL DECOMP/DISCECTOMY FUSION  2003  . ANTERIOR CERVICAL DECOMP/DISCECTOMY FUSION N/A 05/18/2018   Procedure: ANTERIOR CERVICAL DECOMPRESSION/DISCECTOMY FUSION - CERVICAL FIVE-CERVICAL SIX, removal of tether plate;  Surgeon: Jovita Gamma, MD;  Location: [place];  Service: Neurosurgery;  Laterality: N/A;  . BACK SURGERY     x3  . CARPAL TUNNEL RELEASE Right 10/31/2013   Procedure: RIGHT CARPAL TUNNEL RELEASE;  Surgeon: Cammie Sickle., MD;  Location: [place];  Service: Orthopedics;  Laterality: Right;  . CARPAL TUNNEL RELEASE Left 11/28/2013   Procedure: LEFT CARPAL TUNNEL RELEASE;  Surgeon: Cammie Sickle., MD;  Location: [place];  Service: Orthopedics;  Laterality: Left;  . CERVICAL DISCECTOMY     anterior with patellar allograft and plating  . JOINT REPLACEMENT    . LUMBAR FUSION  12/14  . MELANOMA EXCISION WITH SENTINEL LYMPH NODE BIOPSY  8/02   bilat ing node bx  . ROTATOR CUFF REPAIR Left    08/13/11  08/27/2011  . TOTAL KNEE ARTHROPLASTY Right 10/07/2015   Procedure: RIGHT TOTAL KNEE ARTHROPLASTY, with synovial tissue specimen;  Surgeon: [name], MD;  Location: [facility];  Service: Orthopedics;  Laterality: Right;    Medications Prior to Admission  Medication Sig Dispense Refill Last Dose  . alfuzosin (UROXATRAL) 10 MG 24 hr tablet Take 10 mg by mouth daily with breakfast.   11/10/2018 at 0500  . amLODipine (NORVASC) 5 MG tablet Take 1 tablet (5 mg total) by mouth as directed. (Patient taking differently: Take 5 mg by mouth every morning. ) 90 tablet 1 11/10/2018 at 0500  . colchicine 0.6 MG tablet TAKE 1 TABLET (0.6 MG TOTAL) BY MOUTH 2 (TWO) TIMES DAILY 180 tablet 1 11/10/2018 at 0500  . cycloSPORINE (RESTASIS) 0.05 % ophthalmic emulsion Place 1 drop into both eyes daily.    11/10/2018 at 0500  . DULoxetine (CYMBALTA) 20 MG capsule Take 1 capsule (20 mg total) by mouth 2 (two) times daily. (Patient taking differently: Take 20 mg by mouth daily. ) 360 capsule 0 11/10/2018 at 0500  . gabapentin (NEURONTIN) 400 MG capsule Take 400 mg by mouth 3 (three) times daily.  1 11/10/2018 at 0500  . pantoprazole (PROTONIX) 40 MG tablet Take 40 mg by mouth daily.    11/09/2018 at Unknown  time  . potassium chloride SA (K-DUR,KLOR-CON) 20 MEQ tablet Take 1 tablet (20 mEq total) by mouth daily. (Patient taking differently: Take 10 mEq by mouth every other day. ) 90 tablet 3 11/09/2018 at Unknown time  . quinapril (ACCUPRIL) 10 MG tablet Take 1 tablet (10 mg total) by mouth as directed. Cuts in half to make 15 (Patient taking differently: Take 10 mg by mouth at bedtime. Cuts in half to make 15 ) 90 tablet 2 11/09/2018 at Unknown time  . rosuvastatin (CRESTOR) 10 MG tablet Take 1 tablet (10 mg total) by mouth at bedtime. 90 tablet 3 11/09/2018 at Unknown time  . traMADol (ULTRAM) 50 MG tablet Take 50 mg by mouth every 6 (six) hours as needed for severe pain.    11/09/2018 at Unknown time  . triamterene-hydrochlorothiazide (MAXZIDE-25) 37.5-25 MG tablet Take 1 tablet by mouth every morning.   11/09/2018 at Unknown time  . ALPRAZolam (XANAX) 0.5 MG tablet Take 1/2 to 1 tablet by mouth three times daily as needed for anxiety (Patient not taking: Reported on 10/24/2018) 90 tablet 5  Not Taking at Unknown time  . cyclobenzaprine (FLEXERIL) 10 MG tablet Take 10 mg by mouth 3 (three) times daily as needed for muscle spasms.    More than a month at Unknown time  . montelukast (SINGULAIR) 10 MG tablet Take 1 tablet (10 mg total) by mouth at bedtime. (Patient taking differently: Take 10 mg by mouth at bedtime as needed (allergies). ) 30 tablet 11 More than a month at Unknown time   Allergies  Allergen Reactions  . Gentamycin [Gentamicin] Hives and Itching  . Turmeric Other (See Comments)    Cardiac arrythmias  . Bacitracin Rash  . Hydrocodone Rash  . Moxifloxacin Rash    pt had severe red rash- he thought from Avelox Rx....  . Neosporin [Neomycin-Bacitracin Zn-Polymyx] Rash  . Other Other (See Comments)    Redness  bandaide  . Promethazine Other (See Comments)    Restless legs  . Tape Dermatitis    bandaid-skin reaction  . Triamcinolone Acetonide Itching and Rash          Social History    Tobacco Use  . Smoking status: Never Smoker  . Smokeless tobacco: Never Used  Substance Use Topics  . Alcohol use: Yes    Alcohol/week: 2.0 - 4.0 standard drinks    Types: 2 - 4 Glasses of wine per week    Comment: 3 x a week    Family History  Problem Relation Age of Onset  . Alzheimer's disease Father   . Alzheimer's disease Mother      Review of Systems Pertinent items noted in HPI and remainder of comprehensive ROS otherwise negative.  Objective: Vital signs in last 24 hours: Temp:  [97.7 F (36.5 C)] 97.7 F (36.5 C) (01/16 0553) Pulse Rate:  [71] 71 (01/16 0553) Resp:  [18] 18 (01/16 0553) BP: (142)/(77) 142/77 (01/16 0553) SpO2:  [98 %] 98 % (01/16 0553) Weight:  [80.8 kg] 80.8 kg (01/16 0612)  EXAM: Patient well-developed well-nourished white male in no acute distress.   Lungs are clear to auscultation , the patient has symmetrical respiratory excursion. Heart has a regular rate and rhythm normal S1 and S2 no murmur.   Abdomen is soft nontender nondistended bowel sounds are present. Extremity examination shows no clubbing cyanosis or edema. Motor examination shows the iliopsoas and quadriceps are 5 bilaterally, as are the plantar flexors.  However the dorsiflexors are 4/5 bilaterally, and the EHL are 4-/5 bilaterally.  Sensation is intact to pinprick throughout.  Reflexes are distant.  Gait is mildly unsteady with mild slapping.  Data Review:CBC    Component Value Date/Time   WBC 6.9 11/02/2018 0950   RBC 4.89 11/02/2018 0950   HGB 14.8 11/02/2018 0950   HCT 45.7 11/02/2018 0950   PLT 281 11/02/2018 0950   MCV 93.5 11/02/2018 0950   MCH 30.3 11/02/2018 0950   MCHC 32.4 11/02/2018 0950   RDW 11.9 11/02/2018 0950   LYMPHSABS 1.5 03/09/2017 1139   MONOABS 1.2 (H) 03/09/2017 1139   EOSABS 0.1 03/09/2017 1139   BASOSABS 0.2 (H) 03/09/2017 1139                          BMET    Component Value Date/Time   NA 139 11/02/2018 0950   K 4.5 11/02/2018 0950   CL  103 11/02/2018 0950   CO2 28 11/02/2018 0950   GLUCOSE 105 (H) 11/02/2018 0950   BUN 20 11/02/2018 0950   CREATININE 1.09  11/02/2018 0950   CREATININE 0.97 10/09/2011 1140   CALCIUM 9.6 11/02/2018 0950   GFRNONAA >60 11/02/2018 0950   GFRAA >60 11/02/2018 0950     Assessment/Plan: Patient with marked to severe stenosis at the L2-3 level with progressive neurogenic claudication and more recent development of bilateral foot drops, as well as union pseudoarthrosis at L3-4, is admitted for L2-3 lumbar decompression and stabilization and revision of L3-4 posterior lateral arthrodesis.  I've discussed with the patient the nature of his condition, the nature the surgical procedure, the typical length of surgery, hospital stay, and overall recuperation, the limitations postoperatively, and risks of surgery. I discussed risks including risks of infection, bleeding, possibly need for transfusion, the risk of nerve root dysfunction with pain, weakness, numbness, or paresthesias, the risk of dural tear and CSF leakage and possible need for further surgery, the risk of failure of the arthrodesis and possibly for further surgery, the risk of anesthetic complications including myocardial infarction, stroke, pneumonia, and death. We discussed the need for postoperative immobilization in a lumbar brace. Understanding all this the patient does wish to proceed with surgery and is admitted for such.   Hosie Spangle, MD 11/10/2018 7:05 AM

## 2018-11-11 MED ORDER — CELECOXIB 100 MG PO CAPS: 100.0000 mg | ORAL_CAPSULE | Freq: Every day | ORAL | 1 refills | Status: DC

## 2018-11-11 MED ORDER — HYDROMORPHONE HCL 2 MG PO TABS: 1.0000 mg | ORAL_TABLET | ORAL | 0 refills | Status: DC | PRN

## 2018-11-11 NOTE — Discharge Summary (Signed)
Physician Discharge Summary  Patient ID: Peter FLOREK, MD MRN: [id number] DOB/AGE: Mar 20, 1946 73 y.o.  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  Postoperatively he is done well.  He is up and ambulating actively.  He is comfortable.  His incision is healing nicely; there is no erythema, ecchymosis, swelling, or drainage.  He is voiding well.  We are discharging him to home with instructions regarding wound care and activities.  In addition to the prescriptions given through epic he was also given a written prescription for cyclobenzaprine 10 mg tablets, with instructions to take half to 1 tablet 3 times daily as needed muscle spasms, 60 tablets, 1 refill.  He is scheduled follow-up in the office in 3 weeks with x-rays.  Discharge Exam: Blood pressure 122/77, pulse 82, temperature 97.9 F (36.6 C), temperature source Oral, resp. rate 16, height 5\' 9"  (1.753 m), weight 80.8 kg, SpO2 96 %.  Disposition: Discharge disposition: 01-Home or Self Care       Discharge Instructions    Discharge wound care:   Complete by:  As directed    Leave the wound open to air. Shower daily with the wound uncovered. Water and soapy water should run over the incision area. Do not wash directly on the incision for 2 weeks. Remove the glue  after 2 weeks.   Driving Restrictions   Complete by:  As directed    No driving for 2 weeks. May ride in the car locally now. May begin to drive locally in 2 weeks.   Other Restrictions   Complete by:  As directed    Walk gradually increasing distances out in the fresh air at least twice a day. Walking additional 6 times inside the house, gradually increasing distances, daily. No bending, lifting, or twisting. Perform activities between shoulder and waist height (that is at counter height when standing or table height when sitting).     Allergies as of 11/11/2018      Reactions   Gentamycin [gentamicin] Hives, Itching   Turmeric Other (See Comments)   Cardiac arrythmias   Bacitracin Rash   Hydrocodone Rash   Moxifloxacin Rash   pt had severe red rash- he thought from Avelox Rx....   Neosporin [neomycin-bacitracin Zn-polymyx] Rash   Other Other (See Comments)   Redness  bandaide   Promethazine Other (See Comments)   Restless legs   Tape Dermatitis   bandaid-skin reaction   Triamcinolone Acetonide Itching, Rash         Medication List    STOP taking these medications   ALPRAZolam 0.5 MG tablet Commonly known as:  XANAX     TAKE these medications   amLODipine 5 MG tablet Commonly known as:  NORVASC Take 1 tablet (5 mg total) by mouth as directed. What changed:  when to take this   celecoxib 100 MG capsule Commonly known as:  CELEBREX Take 1 capsule (100 mg total) by mouth daily.   colchicine 0.6 MG tablet TAKE 1 TABLET (0.6 MG TOTAL) BY MOUTH 2 (TWO)  TIMES DAILY   cyclobenzaprine 10 MG tablet Commonly known as:  FLEXERIL Take 10 mg by mouth 3 (three) times daily as needed for muscle spasms.   DULoxetine 20 MG capsule Commonly known as:  CYMBALTA Take 1 capsule (20 mg total) by mouth 2 (two) times daily. What changed:  when to take this   gabapentin 400 MG capsule Commonly known as:  NEURONTIN Take 400 mg by mouth 3 (three) times daily.   HYDROmorphone 2 MG  tablet Commonly known as:  DILAUDID Take 0.5-1 tablets (1-2 mg total) by mouth every 4 (four) hours as needed (pain).   montelukast 10 MG tablet Commonly known as:  SINGULAIR Take 1 tablet (10 mg total) by mouth at bedtime. What changed:    when to take this  reasons to take this   pantoprazole 40 MG tablet Commonly known as:  PROTONIX Take 40 mg by mouth daily.   potassium chloride SA 20 MEQ tablet Commonly known as:  K-DUR,KLOR-CON Take 1 tablet (20 mEq total) by mouth daily. What changed:    how much to take  when to take this   quinapril 10 MG tablet Commonly known as:  ACCUPRIL Take 1 tablet (10 mg total) by mouth as directed. Cuts in half to make 15 What changed:  when to take this   RESTASIS 0.05 % ophthalmic emulsion Generic drug:  cycloSPORINE Place 1 drop into both eyes daily.   rosuvastatin 10 MG tablet Commonly known as:  CRESTOR Take 1 tablet (10 mg total) by mouth at bedtime.   traMADol 50 MG tablet Commonly known as:  ULTRAM Take 50 mg by mouth every 6 (six) hours as needed for severe pain.   triamterene-hydrochlorothiazide 37.5-25 MG tablet Commonly known as:  MAXZIDE-25 Take 1 tablet by mouth every morning.   UROXATRAL 10 MG 24 hr tablet Generic drug:  alfuzosin Take 10 mg by mouth daily with breakfast.            Discharge Care Instructions  (From admission, onward)         Start     Ordered   11/11/18 0000  Discharge wound care:    Comments:  Leave the wound open to air. Shower daily with the wound uncovered. Water and soapy water should run over the incision area. Do not wash directly on the incision for 2 weeks. Remove the glue after 2 weeks.   11/11/18 0803         Follow-up Information    Jovita Gamma, MD Follow up.   Specialty:  Neurosurgery Contact information: [address] [place] [address] [phone]           Signed: Hosie Spangle 11/11/2018, 8:05 AM

## 2018-11-11 NOTE — Discharge Instructions (Signed)
°  Call Your Doctor If Any of These Occur °Redness, drainage, or swelling at the wound.  °Temperature greater than 101 degrees. °Severe pain not relieved by pain medication. °Incision starts to come apart. °Follow Up Appt °Call today for appointment in 3 weeks ([phone]) or for problems.  If you have any hardware placed in your spine, you will need an x-ray before your appointment. °

## 2018-11-11 NOTE — Progress Notes (Signed)
Patient alert and oriented, mae's well, voiding adequate amount of urine, swallowing without difficulty, no c/o pain at time of discharge. Patient discharged home with family. Script and discharged instructions given to patient. Patient and family stated understanding of instructions given. Patient has an appointment with Dr. Nudelman 

## 2018-11-15 MED FILL — Sodium Chloride IV Soln 0.9%: INTRAVENOUS | Qty: 1000 | Status: AC

## 2018-11-15 MED FILL — Heparin Sodium (Porcine) Inj 1000 Unit/ML: INTRAMUSCULAR | Qty: 30 | Status: AC

## 2018-11-18 DIAGNOSIS — S32009K Unspecified fracture of unspecified lumbar vertebra, subsequent encounter for fracture with nonunion: Secondary | ICD-10-CM | POA: Diagnosis not present

## 2018-11-18 DIAGNOSIS — M25552 Pain in left hip: Secondary | ICD-10-CM | POA: Diagnosis not present

## 2018-11-18 DIAGNOSIS — M48062 Spinal stenosis, lumbar region with neurogenic claudication: Secondary | ICD-10-CM | POA: Diagnosis not present

## 2018-11-18 DIAGNOSIS — Z981 Arthrodesis status: Secondary | ICD-10-CM | POA: Diagnosis not present

## 2018-11-21 DIAGNOSIS — D0372 Melanoma in situ of left lower limb, including hip: Secondary | ICD-10-CM | POA: Diagnosis not present

## 2018-11-21 DIAGNOSIS — Z85828 Personal history of other malignant neoplasm of skin: Secondary | ICD-10-CM | POA: Diagnosis not present

## 2018-11-21 DIAGNOSIS — Z85820 Personal history of malignant melanoma of skin: Secondary | ICD-10-CM | POA: Diagnosis not present

## 2018-12-01 DIAGNOSIS — Z85828 Personal history of other malignant neoplasm of skin: Secondary | ICD-10-CM | POA: Diagnosis not present

## 2018-12-01 DIAGNOSIS — D0372 Melanoma in situ of left lower limb, including hip: Secondary | ICD-10-CM | POA: Diagnosis not present

## 2018-12-01 DIAGNOSIS — Z85820 Personal history of malignant melanoma of skin: Secondary | ICD-10-CM | POA: Diagnosis not present

## 2019-01-04 DIAGNOSIS — H6123 Impacted cerumen, bilateral: Secondary | ICD-10-CM | POA: Diagnosis not present

## 2019-01-04 DIAGNOSIS — H8102 Meniere's disease, left ear: Secondary | ICD-10-CM | POA: Diagnosis not present

## 2019-01-04 DIAGNOSIS — H9313 Tinnitus, bilateral: Secondary | ICD-10-CM | POA: Diagnosis not present

## 2019-01-04 DIAGNOSIS — H903 Sensorineural hearing loss, bilateral: Secondary | ICD-10-CM | POA: Diagnosis not present

## 2019-01-04 DIAGNOSIS — H8103 Meniere's disease, bilateral: Secondary | ICD-10-CM | POA: Diagnosis not present

## 2019-01-06 DIAGNOSIS — Z981 Arthrodesis status: Secondary | ICD-10-CM | POA: Diagnosis not present

## 2019-01-06 DIAGNOSIS — M5136 Other intervertebral disc degeneration, lumbar region: Secondary | ICD-10-CM | POA: Diagnosis not present

## 2019-01-06 DIAGNOSIS — M47816 Spondylosis without myelopathy or radiculopathy, lumbar region: Secondary | ICD-10-CM | POA: Diagnosis not present

## 2019-02-15 DIAGNOSIS — Z85828 Personal history of other malignant neoplasm of skin: Secondary | ICD-10-CM | POA: Diagnosis not present

## 2019-02-15 DIAGNOSIS — L570 Actinic keratosis: Secondary | ICD-10-CM | POA: Diagnosis not present

## 2019-02-15 DIAGNOSIS — Z85820 Personal history of malignant melanoma of skin: Secondary | ICD-10-CM | POA: Diagnosis not present

## 2019-03-05 ENCOUNTER — Other Ambulatory Visit: Payer: Self-pay | Admitting: Pulmonary Disease

## 2019-03-14 ENCOUNTER — Other Ambulatory Visit: Payer: Self-pay | Admitting: Pulmonary Disease

## 2019-03-16 DIAGNOSIS — Z85820 Personal history of malignant melanoma of skin: Secondary | ICD-10-CM | POA: Diagnosis not present

## 2019-03-16 DIAGNOSIS — I10 Essential (primary) hypertension: Secondary | ICD-10-CM | POA: Diagnosis not present

## 2019-03-16 DIAGNOSIS — Z8679 Personal history of other diseases of the circulatory system: Secondary | ICD-10-CM | POA: Diagnosis not present

## 2019-03-16 DIAGNOSIS — M1990 Unspecified osteoarthritis, unspecified site: Secondary | ICD-10-CM | POA: Diagnosis not present

## 2019-03-16 DIAGNOSIS — K219 Gastro-esophageal reflux disease without esophagitis: Secondary | ICD-10-CM | POA: Diagnosis not present

## 2019-03-16 DIAGNOSIS — M1180 Other specified crystal arthropathies, unspecified site: Secondary | ICD-10-CM | POA: Diagnosis not present

## 2019-03-16 DIAGNOSIS — E7800 Pure hypercholesterolemia, unspecified: Secondary | ICD-10-CM | POA: Diagnosis not present

## 2019-03-29 ENCOUNTER — Other Ambulatory Visit: Payer: Self-pay | Admitting: Pulmonary Disease

## 2019-04-07 DIAGNOSIS — G629 Polyneuropathy, unspecified: Secondary | ICD-10-CM | POA: Diagnosis not present

## 2019-04-07 DIAGNOSIS — M21371 Foot drop, right foot: Secondary | ICD-10-CM | POA: Diagnosis not present

## 2019-04-07 DIAGNOSIS — M47816 Spondylosis without myelopathy or radiculopathy, lumbar region: Secondary | ICD-10-CM | POA: Diagnosis not present

## 2019-04-07 DIAGNOSIS — M21372 Foot drop, left foot: Secondary | ICD-10-CM | POA: Diagnosis not present

## 2019-04-07 DIAGNOSIS — Z981 Arthrodesis status: Secondary | ICD-10-CM | POA: Diagnosis not present

## 2019-04-07 DIAGNOSIS — M5136 Other intervertebral disc degeneration, lumbar region: Secondary | ICD-10-CM | POA: Diagnosis not present

## 2019-04-10 ENCOUNTER — Other Ambulatory Visit: Payer: Self-pay

## 2019-04-10 ENCOUNTER — Ambulatory Visit: Payer: Medicare Other | Attending: Neurosurgery

## 2019-04-10 DIAGNOSIS — M6281 Muscle weakness (generalized): Secondary | ICD-10-CM | POA: Diagnosis not present

## 2019-04-10 DIAGNOSIS — R293 Abnormal posture: Secondary | ICD-10-CM

## 2019-04-10 NOTE — Therapy (Signed)
[facility] [address], [place], [place], [address] Phone: [phone]   Fax:  [phone]  Physical Therapy Evaluation  Patient Details  Name: Peter STROOT, MD MRN: [id number] Date of Birth: 1946-09-09 Referring Provider (PT): Jovita Gamma, MD   Encounter Date: 04/10/2019  PT End of Session - 04/10/19 1629    Visit Number  1    Date for PT Re-Evaluation  05/22/19    Authorization Type  Medicare    PT Start Time  1530    PT Stop Time  1622    PT Time Calculation (min)  52 min    Activity Tolerance  Patient tolerated treatment well    Behavior During Therapy  [facility] for tasks assessed/performed       Past Medical History:  Diagnosis Date  . Allergic rhinitis   . Anxiety   . Cancer [facility])    Melanoma -peri umbilical '02 or '03- no further problems. 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   . Chest pain    Nuclear 2005, normal / coronary CTA 2008, slight mixed plaque, coronary calcium score 0.65, ejection fraction 55%, echo, March, 2008  . Complication of anesthesia    itching-not sure if oxycodone or anesthesia,and shakes. Right TMJ issue  . Dizziness    Dizziness with question of presyncope April 08, 2011  . DJD (degenerative joint disease)    history DDD, fingers, knees, shoulders  . Ejection fraction    EF 55%, echo, March, 2008.  . Family history of Alzheimer's disease   . GERD (gastroesophageal reflux disease)   . H/O hiatal hernia   . Hearing impaired    bilateral hearing aids  . Heart murmur    73 yrs old rheumatic fever   . Heart murmur 2008   trivial tricusp regurg  . History of colonic polyps   . History of melanoma   . History of pseudogout   . History of recent steroid use    10-10-15 tapering steroid use for tx. recent bronchitis.  Marland Kitchen History of rheumatic fever   . HNP (herniated nucleus pulposus), cervical   . Hypercholesteremia    borderline  . Hypertension   . Lumbar spondylosis  with myelopathy 06/09/2013  . Spondylosis   . Tinnitus    chronic  . TMJ click    right"was aggravated with last intubation"    Past Surgical History:  Procedure Laterality Date  . ANTERIOR CERVICAL DECOMP/DISCECTOMY FUSION  2003  . ANTERIOR CERVICAL DECOMP/DISCECTOMY FUSION N/A 05/18/2018   Procedure: ANTERIOR CERVICAL DECOMPRESSION/DISCECTOMY FUSION - CERVICAL FIVE-CERVICAL SIX, removal of tether plate;  Surgeon: Jovita Gamma, MD;  Location: [place];  Service: Neurosurgery;  Laterality: N/A;  . BACK SURGERY     x3  . CARPAL TUNNEL RELEASE Right 10/31/2013   Procedure: RIGHT CARPAL TUNNEL RELEASE;  Surgeon: Cammie Sickle., MD;  Location: [place];  Service: Orthopedics;  Laterality: Right;  . CARPAL TUNNEL RELEASE Left 11/28/2013   Procedure: LEFT CARPAL TUNNEL RELEASE;  Surgeon: Cammie Sickle., MD;  Location: [place];  Service: Orthopedics;  Laterality: Left;  . CERVICAL DISCECTOMY     anterior with patellar allograft and plating  . JOINT REPLACEMENT    . LUMBAR FUSION  12/14  . MELANOMA EXCISION WITH SENTINEL LYMPH NODE BIOPSY  8/02   bilat ing node bx  . ROTATOR CUFF REPAIR Left    08/13/11  08/27/2011  . TOTAL KNEE ARTHROPLASTY Right  10/07/2015   Procedure: RIGHT TOTAL KNEE ARTHROPLASTY, with synovial tissue specimen;  Surgeon: [name], MD;  Location: [facility];  Service: Orthopedics;  Laterality: Right;    There were no vitals filed for this visit.   Subjective Assessment - 04/10/19 1526    Subjective  PT presents to PT s/p lumbar L2-3 fusion and L3-4 revision surgery performed 11/10/18.    Pertinent History  cervical fusion 04/2018, lumbar fusion L2-3 and L3-4 revision 11/10/2018, Rt TKA, Lt shoulder replacement    Patient Stated Goals  improve core strength and posture, wean from brace    Currently in Pain?  Yes    Pain Score  1     Pain Location  Back    Pain Orientation  Lower;Left;Right    Pain Descriptors / Indicators   Discomfort    Pain Type  Chronic pain    Pain Onset  More than a month ago    Pain Frequency  Constant    Aggravating Factors   after swimming breast stroke, with fatigue    Pain Relieving Factors  sitting, supine         [facility] PT Assessment - 04/10/19 0001      Assessment   Medical Diagnosis  arthrodesis status (L3-4 revsion, PLIF L2-3)    Referring Provider (PT)  Jovita Gamma, MD    Onset Date/Surgical Date  11/10/18    Next MD Visit  none      Precautions   Precautions  Back    Precaution Comments  s/p lumbar fusion and revision      Restrictions   Weight Bearing Restrictions  No      Balance Screen   Has the patient fallen in the past 6 months  No    Has the patient had a decrease in activity level because of a fear of falling?   No    Is the patient reluctant to leave their home because of a fear of falling?   No      Home Environment   Living Environment  Private residence    Type of [place]  Two level      Prior Function   Level of Independence  Independent    Vocation  Retired    Biomedical scientist  retired Music therapist, walking for exercise      Cognition   Overall Cognitive Status  Within Functional Limits for tasks assessed      Observation/Other Assessments   Focus on Therapeutic Outcomes (FOTO)   40% limitation      Posture/Postural Control   Posture/Postural Control  Postural limitations    Postural Limitations  Flexed trunk      ROM / Strength   AROM / PROM / Strength  AROM;PROM;Strength      AROM   Overall AROM   Deficits    Overall AROM Comments  lumbar A/ROM is limited in all directions due to lumbar fusion L2-5      PROM   Overall PROM   Deficits    Overall PROM Comments  bil hip flexibility is limited by 50% in all directions without pain      Strength   Overall Strength  Within functional limits for tasks performed    Overall Strength Comments  pt with good core activation.  Bil hip  strength is 4+/5, knees 5/5    Strength Assessment Site  Ankle    Right/Left Ankle  Right;Left  Right Ankle Dorsiflexion  4+/5    Left Ankle Dorsiflexion  4/5      Palpation   Palpation comment  well healing surgical incision over lumbar spine.  No significant tenderness over lumbar paraspinals or gluteals      Transfers   Transfers  Independent with all Transfers      Ambulation/Gait   Ambulation/Gait  Yes    Gait Pattern  Within Functional Limits;Decreased dorsiflexion - right;Decreased dorsiflexion - left                Objective measurements completed on examination: See above findings.              PT Education - 04/10/19 1616    Education Details  Access Code: [email or web address]    Person(s) Educated  Patient    Methods  Explanation;Demonstration;Handout    Comprehension  Verbalized understanding;Returned demonstration       PT Short Term Goals - 04/10/19 1637      PT SHORT TERM GOAL #1   Title  be independent in initial HEP    Time  4    Period  Weeks    Status  New    Target Date  05/01/19      PT SHORT TERM GOAL #2   Title  demonstrate erect standing posture and report abdominal bracing with standing and walking    Time  3    Period  Weeks    Status  New    Target Date  05/01/19        PT Long Term Goals - 04/10/19 1638      PT LONG TERM GOAL #1   Title  be independent in advanced HEP    Time  6    Period  Weeks    Status  New    Target Date  05/22/19      PT LONG TERM GOAL #2   Title  reduce FOTO to < or = to 35% limitation    Time  6    Period  Weeks    Status  New    Target Date  05/22/19      PT LONG TERM GOAL #3   Title  verbalize and demonstrate understanding of progression of postural strength exercises    Time  6    Period  Weeks    Status  New    Target Date  05/22/19      PT LONG TERM GOAL #4   Title  report 50% less lumbar fatigue with standing and walking    Time  6    Period  Weeks    Target Date  05/22/19       PT LONG TERM GOAL #5   Title  verbalize and demostrate correct body mehcanics for safety with daily activities    Time  6    Period  Weeks    Status  New    Target Date  05/22/19             Plan - 04/10/19 1633    Clinical Impression Statement  Pt presents to PT 5 months s/p lumbar surgery for L3-4 revision and fusion at L2-3.  Pt was wearing brace until his MD visit last week and MD would like pt to work on his strength to wean from brace and improve posture and strength.  Pt demonstrates forward trunk flexion in standing and genu varus bilaterally.  Pt with decreased lumbar A/ROM due to fusion L2-5 and reduced  hip flexibility bilaterally. Pt with reduced bil DF strength and mild foot drop.  Pt will benefit from skilled PT to address chronic core and postural weakness and allow for return to endurance and functional tasks with safety.    Personal Factors and Comorbidities  Age;Comorbidity 3+    Comorbidities  3 lumbar surgeries, cervical fusion, Pseudogout    Examination-Activity Limitations  Bed Mobility;Locomotion Level;Stand    Examination-Participation Restrictions  Cleaning    Stability/Clinical Decision Making  Evolving/Moderate complexity    Clinical Decision Making  Moderate    Rehab Potential  Excellent    PT Frequency  2x / week    PT Duration  6 weeks    PT Treatment/Interventions  ADLs/Self Care Home Management;Cryotherapy;Electrical Stimulation;Moist Heat;Functional mobility training;Therapeutic activities;Therapeutic exercise;Neuromuscular re-education;Manual techniques;Patient/family education;Passive range of motion;Dry needling;Taping    PT Next Visit Plan  review HEP, add more pool exercises, TA activation progression exercises    PT Home Exercise Plan  Access Code: [email or web address]    Consulted and Agree with Plan of Care  Patient       Patient will benefit from skilled therapeutic intervention in order to improve the following deficits and impairments:  Abnormal  gait, Decreased activity tolerance, Decreased balance, Decreased mobility, Decreased endurance, Decreased range of motion, Decreased strength, Difficulty walking, Impaired flexibility, Improper body mechanics, Postural dysfunction  Visit Diagnosis: 1. Muscle weakness (generalized)   2. Abnormal posture        Problem List Patient Active Problem List   Diagnosis Date Noted  . HNP (herniated nucleus pulposus), cervical 05/18/2018  . Arthrodesis status 05/08/2016  . Polyneuropathy 05/08/2016  . Other intervertebral disc degeneration, lumbar region 05/08/2016  . Spinal stenosis of lumbar region 05/08/2016  . Anesthesia of skin 05/08/2016  . OA (osteoarthritis) of knee 10/07/2015  . Encounter for immunization 09/03/2015  . Lesion of skeletal muscle 04/22/2015  . HLD (hyperlipidemia) 02/15/2014  . Lumbar stenosis with neurogenic claudication 10/02/2013  . Lumbosacral spinal stenosis 06/09/2013  . Lumbar spondylosis with myelopathy 06/09/2013  . Tear film insufficiency, unspecified 06/05/2013  . Thoracic or lumbosacral neuritis or radiculitis, unspecified 06/05/2013  . Inflammation of shoulder joint 10/09/2011  . History of repair of left rotator cuff 10/09/2011  . Need for diphtheria-tetanus-pertussis (Tdap) vaccine, adult/adolescent 04/13/2011  . Chest pain   . Dizziness   . Physical exam, annual 03/26/2011  . Osteoarthritis 06/21/2010  . Pseudogout involving multiple joints 06/20/2010  . HIP PAIN 06/20/2010  . HYPERCHOLESTEROLEMIA, BORDERLINE 07/19/2008  . TINNITUS, CHRONIC 07/19/2008  . RHEUMATIC FEVER 07/19/2008  . Allergic rhinitis 07/19/2008  . GERD 07/19/2008  . SPONDYLOSIS 07/19/2008  . COLONIC POLYPS, HX OF 07/19/2008  . Melanoma of skin ([place]) 01/11/2008  . Essential hypertension 01/11/2008    Sigurd Sos, PT 04/10/19 4:43 PM  [facility] [address] [place], [place], [address] Phone: [phone]    Fax:  [phone]  Name: Peter BUTTERY, MD MRN: [id number] Date of Birth: 01-24-1946

## 2019-04-10 NOTE — Patient Instructions (Signed)
Access Code: [email or web address]  URL: [email or web address]  Date: 04/10/2019  Prepared by: Sigurd Sos   Exercises  Push-Up on Pool Wall - 10 reps - 2 sets - 1x daily - 7x weekly  Standing Hip Abduction Adduction at [place] - 10 reps - 2 sets - 1x daily - 7x weekly  Standing March at [place] reps - 3 sets - 1x daily - 7x weekly  Clamshell - 10 reps - 2 sets - 2x daily - 7x weekly  Seated Hamstring Stretch - 10 reps - 3 sets - 1x daily - 7x weekly  Standing Shoulder Horizontal Abduction with Resistance - 10 reps - 2 sets - 2x daily - 7x weekly  Standing Shoulder External Rotation with Resistance - 10 reps - 3 sets - 2x daily - 7x weekly

## 2019-04-12 ENCOUNTER — Other Ambulatory Visit: Payer: Self-pay

## 2019-04-12 ENCOUNTER — Ambulatory Visit: Payer: Medicare Other | Admitting: Physical Therapy

## 2019-04-12 ENCOUNTER — Encounter: Payer: Self-pay | Admitting: Physical Therapy

## 2019-04-12 DIAGNOSIS — M6281 Muscle weakness (generalized): Secondary | ICD-10-CM | POA: Diagnosis not present

## 2019-04-12 DIAGNOSIS — R293 Abnormal posture: Secondary | ICD-10-CM

## 2019-04-12 NOTE — Therapy (Signed)
[facility] [address], [place], [place], [address] Phone: [phone]   Fax:  [phone]  Physical Therapy Treatment  Patient Details  Name: Peter MECHAM, Peter [name] MRN: [id number] Date of Birth: Dec 30, 1945 Referring Provider (PT): Jovita Gamma, Peter [name]   Encounter Date: 04/12/2019  PT End of Session - 04/12/19 1130    Visit Number  2    Date for PT Re-Evaluation  05/22/19    Authorization Type  Medicare    PT Start Time  1033    PT Stop Time  1116    PT Time Calculation (min)  43 min    Activity Tolerance  Patient tolerated treatment well       Past Medical History:  Diagnosis Date  . Allergic rhinitis   . Anxiety   . Cancer [facility])    Melanoma -peri umbilical '02 or '03- no further problems. 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   . Chest pain    Nuclear 2005, normal / coronary CTA 2008, slight mixed plaque, coronary calcium score 0.65, ejection fraction 55%, echo, March, 2008  . Complication of anesthesia    itching-not sure if oxycodone or anesthesia,and shakes. Right TMJ issue  . Dizziness    Dizziness with question of presyncope April 08, 2011  . DJD (degenerative joint disease)    history DDD, fingers, knees, shoulders  . Ejection fraction    EF 55%, echo, March, 2008.  . Family history of Alzheimer's disease   . GERD (gastroesophageal reflux disease)   . H/O hiatal hernia   . Hearing impaired    bilateral hearing aids  . Heart murmur    73 yrs old rheumatic fever   . Heart murmur 2008   trivial tricusp regurg  . History of colonic polyps   . History of melanoma   . History of pseudogout   . History of recent steroid use    10-10-15 tapering steroid use for tx. recent bronchitis.  Marland Kitchen History of rheumatic fever   . HNP (herniated nucleus pulposus), cervical   . Hypercholesteremia    borderline  . Hypertension   . Lumbar spondylosis with myelopathy 06/09/2013  . Spondylosis   . Tinnitus    chronic  . TMJ click    right"was aggravated with last intubation"    Past Surgical History:  Procedure Laterality Date  . ANTERIOR CERVICAL DECOMP/DISCECTOMY FUSION  2003  . ANTERIOR CERVICAL DECOMP/DISCECTOMY FUSION N/A 05/18/2018   Procedure: ANTERIOR CERVICAL DECOMPRESSION/DISCECTOMY FUSION - CERVICAL FIVE-CERVICAL SIX, removal of tether plate;  Surgeon: Jovita Gamma, Peter [name];  Location: [place];  Service: Neurosurgery;  Laterality: N/A;  . BACK SURGERY     x3  . CARPAL TUNNEL RELEASE Right 10/31/2013   Procedure: RIGHT CARPAL TUNNEL RELEASE;  Surgeon: Cammie Sickle., Peter [name];  Location: [place];  Service: Orthopedics;  Laterality: Right;  . CARPAL TUNNEL RELEASE Left 11/28/2013   Procedure: LEFT CARPAL TUNNEL RELEASE;  Surgeon: Cammie Sickle., Peter [name];  Location: [place];  Service: Orthopedics;  Laterality: Left;  . CERVICAL DISCECTOMY     anterior with patellar allograft and plating  . JOINT REPLACEMENT    . LUMBAR FUSION  12/14  . MELANOMA EXCISION WITH SENTINEL LYMPH NODE BIOPSY  8/02   bilat ing node bx  . ROTATOR CUFF REPAIR Left    08/13/11  08/27/2011  . TOTAL KNEE ARTHROPLASTY Right 10/07/2015   Procedure: RIGHT TOTAL KNEE ARTHROPLASTY, with synovial tissue specimen;  Surgeon: [name], Peter [name];  [facility];  Service: Orthopedics;  Laterality: Right;    There were no vitals filed for this visit.  Subjective Assessment - 04/12/19 1032    Subjective  "I'm doing fine."  The patient reports he has not been able to do pool ex's b/c of the rain.  Denies difficulty with initial HEP.    Pertinent History  cervical fusion 04/2018, lumbar fusion L2-3 and L3-4 revision 11/10/2018, Rt TKA, Lt shoulder replacement    Currently in Pain?  No/denies    Pain Score  0-No pain    Pain Location  Back                       [facility] Adult PT Treatment/Exercise - 04/12/19 0001      Therapeutic Activites    ADL's  ways to tie his shoe without  lumbar flexion    Lifting  hip hinge with and without dowel 2x 10      Lumbar Exercises: Aerobic   Nustep  seat 8 12 min L2      Lumbar Exercises: Supine   Ab Set  10 reps    Bent Knee Raise  5 reps    Other Supine Lumbar Exercises  single leg lowering from 90 degree 5x right/left       Knee/Hip Exercises: Stretches   Other Knee/Hip Stretches  doorway hip flexor/calf stretch right/left without and without UE elevation 3x5      Knee/Hip Exercises: Standing   Heel Raises  Both;20 reps    Heel Raises Limitations  weight shifting with raise and lowers in box formation     Other Standing Knee Exercises  green band around thighs with 3 way hip abduction, diagonal and extension 10x right/left    Other Standing Knee Exercises  green band around thighs with side stepping 8x             PT Education - 04/12/19 1123    Education Details  Access Code: [email or web address]   doorway hip flexor/calf stretch,  standing heel raises, green band hip abduction and sidestepping, supine transverse abd leg lowers; hip hinge    Person(s) Educated  Patient    Methods  Explanation;Demonstration;Handout    Comprehension  Returned demonstration;Verbalized understanding       PT Short Term Goals - 04/10/19 1637      PT SHORT TERM GOAL #1   Title  be independent in initial HEP    Time  4    Period  Weeks    Status  New    Target Date  05/01/19      PT SHORT TERM GOAL #2   Title  demonstrate erect standing posture and report abdominal bracing with standing and walking    Time  3    Period  Weeks    Status  New    Target Date  05/01/19        PT Long Term Goals - 04/10/19 1638      PT LONG TERM GOAL #1   Title  be independent in advanced HEP    Time  6    Period  Weeks    Status  New    Target Date  05/22/19      PT LONG TERM GOAL #2   Title  reduce FOTO to < or = to 35% limitation    Time  6    Period  Weeks    Status  New  Target Date  05/22/19      PT LONG TERM GOAL #3   Title   verbalize and demonstrate understanding of progression of postural strength exercises    Time  6    Period  Weeks    Status  New    Target Date  05/22/19      PT LONG TERM GOAL #4   Title  report 50% less lumbar fatigue with standing and walking    Time  6    Period  Weeks    Target Date  05/22/19      PT LONG TERM GOAL #5   Title  verbalize and demostrate correct body mehcanics for safety with daily activities    Time  6    Period  Weeks    Status  New    Target Date  05/22/19            Plan - 04/12/19 1052    Clinical Impression Statement  The patient demonstrates good activation of transverse abdominus muscles in supine and able to keep engaged with single leg lowering and without back pain.  Verbal cues for optimum stretch of gastroc and to avoid compensatory strategies. Therapist continually monitoring response with all exercises, not only back pain but other regions as well secondary to multiple ortho issues.   Verbal cues with hip hinge and discussion on using with ADLs and tying his shoes.    Rehab Potential  Excellent    PT Frequency  2x / week    PT Treatment/Interventions  ADLs/Self Care Home Management;Cryotherapy;Electrical Stimulation;Moist Heat;Functional mobility training;Therapeutic activities;Therapeutic exercise;Neuromuscular re-education;Manual techniques;Patient/family education;Passive range of motion;Dry needling;Taping    PT Next Visit Plan  review HEP, TA activation progression exercises; LE strengthening    PT Home Exercise Plan  Access Code: [email or web address]       Patient will benefit from skilled therapeutic intervention in order to improve the following deficits and impairments:  Abnormal gait, Decreased activity tolerance, Decreased balance, Decreased mobility, Decreased endurance, Decreased range of motion, Decreased strength, Difficulty walking, Impaired flexibility, Improper body mechanics, Postural dysfunction  Visit Diagnosis: 1. Muscle weakness  (generalized)   2. Abnormal posture        Problem List Patient Active Problem List   Diagnosis Date Noted  . HNP (herniated nucleus pulposus), cervical 05/18/2018  . Arthrodesis status 05/08/2016  . Polyneuropathy 05/08/2016  . Other intervertebral disc degeneration, lumbar region 05/08/2016  . Spinal stenosis of lumbar region 05/08/2016  . Anesthesia of skin 05/08/2016  . OA (osteoarthritis) of knee 10/07/2015  . Encounter for immunization 09/03/2015  . Lesion of skeletal muscle 04/22/2015  . HLD (hyperlipidemia) 02/15/2014  . Lumbar stenosis with neurogenic claudication 10/02/2013  . Lumbosacral spinal stenosis 06/09/2013  . Lumbar spondylosis with myelopathy 06/09/2013  . Tear film insufficiency, unspecified 06/05/2013  . Thoracic or lumbosacral neuritis or radiculitis, unspecified 06/05/2013  . Inflammation of shoulder joint 10/09/2011  . History of repair of left rotator cuff 10/09/2011  . Need for diphtheria-tetanus-pertussis (Tdap) vaccine, adult/adolescent 04/13/2011  . Chest pain   . Dizziness   . Physical exam, annual 03/26/2011  . Osteoarthritis 06/21/2010  . Pseudogout involving multiple joints 06/20/2010  . HIP PAIN 06/20/2010  . HYPERCHOLESTEROLEMIA, BORDERLINE 07/19/2008  . TINNITUS, CHRONIC 07/19/2008  . RHEUMATIC FEVER 07/19/2008  . Allergic rhinitis 07/19/2008  . GERD 07/19/2008  . SPONDYLOSIS 07/19/2008  . COLONIC POLYPS, HX OF 07/19/2008  . Melanoma of skin ([place]) 01/11/2008  . Essential hypertension 01/11/2008   Ruben Im,  PT 04/12/19 11:40 AM Phone: [phone] Fax: [phone] Alvera Singh 04/12/2019, 11:40 AM  [facility] [address] [place], [place], [address] Phone: [phone]   Fax:  [phone]  Name: Peter MEDDAUGH, Peter [name] MRN: [id number] Date of Birth: [birth year]

## 2019-04-12 NOTE — Patient Instructions (Signed)
Access Code: [email or web address]  URL: [name]  Date: 04/12/2019  Prepared by: Ruben Im   Exercises  Push-Up on Pool Wall - 10 reps - 2 sets - 1x daily - 7x weekly  Standing Hip Abduction Adduction at [place] - 10 reps - 2 sets - 1x daily - 7x weekly  Standing March at [facility] - 10 reps - 3 sets - 1x daily - 7x weekly  Clamshell - 10 reps - 2 sets - 2x daily - 7x weekly  Seated Hamstring Stretch - 10 reps - 3 sets - 1x daily - 7x weekly  Standing Shoulder Horizontal Abduction with Resistance - 10 reps - 2 sets - 2x daily - 7x weekly  Standing Shoulder External Rotation with Resistance - 10 reps - 3 sets - 2x daily - 7x weekly  Hooklying Transversus Abdominis Palpation - 10 reps - 1 sets - 1x daily - 7x weekly  Straight Leg Raise with Opposite Hip and Knee Flexion - 10 reps - 1 sets - 1x daily - 7x weekly  doorway hip flexor stretch - 5 reps - 1 sets - 1x daily - 7x weekly  Standing Heel Raise - 10 reps - 2 sets - 1x daily - 7x weekly  Side Stepping with Resistance at Thighs - 10 reps - 3 sets - 1x daily - 7x weekly  Hip Abduction with Resistance Loop - 10 reps - 2 sets - 1x daily - 7x weekly  Standing Hip Hinge with Dowel - 10 reps - 1 sets - 1x daily - 7x weekly    [facility] [address], [place], [place] [address] Phone # [phone] Fax [phone]

## 2019-04-18 ENCOUNTER — Encounter: Payer: Self-pay | Admitting: Physical Therapy

## 2019-04-18 ENCOUNTER — Ambulatory Visit: Payer: Medicare Other | Admitting: Physical Therapy

## 2019-04-18 ENCOUNTER — Other Ambulatory Visit: Payer: Self-pay

## 2019-04-18 DIAGNOSIS — M6281 Muscle weakness (generalized): Secondary | ICD-10-CM | POA: Diagnosis not present

## 2019-04-18 DIAGNOSIS — R293 Abnormal posture: Secondary | ICD-10-CM | POA: Diagnosis not present

## 2019-04-18 NOTE — Patient Instructions (Signed)
Access Code: [email or web address]  URL: [email or web address]  Date: 04/18/2019  Prepared by: Ruben Im   Exercises  Push-Up on Pool Wall - 10 reps - 2 sets - 1x daily - 7x weekly  Standing Hip Abduction Adduction at [place] - 10 reps - 2 sets - 1x daily - 7x weekly  Standing March at [facility] - 10 reps - 3 sets - 1x daily - 7x weekly  Clamshell - 10 reps - 2 sets - 2x daily - 7x weekly  Seated Hamstring Stretch - 10 reps - 3 sets - 1x daily - 7x weekly  Standing Shoulder Horizontal Abduction with Resistance - 10 reps - 2 sets - 2x daily - 7x weekly  Standing Shoulder External Rotation with Resistance - 10 reps - 3 sets - 2x daily - 7x weekly  Hooklying Transversus Abdominis Palpation - 10 reps - 1 sets - 1x daily - 7x weekly  Straight Leg Raise with Opposite Hip and Knee Flexion - 10 reps - 1 sets - 1x daily - 7x weekly  doorway hip flexor stretch - 5 reps - 1 sets - 1x daily - 7x weekly  Standing Heel Raise - 10 reps - 2 sets - 1x daily - 7x weekly  Side Stepping with Resistance at Thighs - 10 reps - 3 sets - 1x daily - 7x weekly  Hip Abduction with Resistance Loop - 10 reps - 2 sets - 1x daily - 7x weekly  Standing Hip Hinge with Dowel - 10 reps - 1 sets - 1x daily - 7x weekly  Side Plank on Knees - 10 reps - 1 sets - 1x daily - 7x weekly  Sidelying Hip Abduction - 10 reps - 1 sets - 1x daily - 7x weekly  Single Leg Stance - 10 reps - 3 sets - 1x daily - 7x weekly  Bird Dog - 10 reps - 1 sets - 1x daily - 7x weekly    [facility] [address], [place], [place] [address] Phone # [phone] Fax [phone]

## 2019-04-18 NOTE — Therapy (Signed)
[facility] [address], [place], [place], [address] Phone: [phone]   Fax:  [phone]  Physical Therapy Treatment  Patient Details  Name: Peter CREMER, Peter [name] MRN: [id number] Date of Birth: 26-Jan-1946 Referring Provider (PT): Jovita Gamma, Peter [name]   Encounter Date: 04/18/2019  PT End of Session - 04/18/19 1524    Visit Number  3    Date for PT Re-Evaluation  05/22/19    Authorization Type  Medicare    PT Start Time  1430    PT Stop Time  0093    PT Time Calculation (min)  46 min    Activity Tolerance  Patient tolerated treatment well       Past Medical History:  Diagnosis Date  . Allergic rhinitis   . Anxiety   . Cancer [facility])    Melanoma -peri umbilical '02 or '03- no further problems. 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   . Chest pain    Nuclear 2005, normal / coronary CTA 2008, slight mixed plaque, coronary calcium score 0.65, ejection fraction 55%, echo, March, 2008  . Complication of anesthesia    itching-not sure if oxycodone or anesthesia,and shakes. Right TMJ issue  . Dizziness    Dizziness with question of presyncope April 08, 2011  . DJD (degenerative joint disease)    history DDD, fingers, knees, shoulders  . Ejection fraction    EF 55%, echo, March, 2008.  . Family history of Alzheimer's disease   . GERD (gastroesophageal reflux disease)   . H/O hiatal hernia   . Hearing impaired    bilateral hearing aids  . Heart murmur    73 yrs old rheumatic fever   . Heart murmur 2008   trivial tricusp regurg  . History of colonic polyps   . History of melanoma   . History of pseudogout   . History of recent steroid use    10-10-15 tapering steroid use for tx. recent bronchitis.  Marland Kitchen History of rheumatic fever   . HNP (herniated nucleus pulposus), cervical   . Hypercholesteremia    borderline  . Hypertension   . Lumbar spondylosis with myelopathy 06/09/2013  . Spondylosis   . Tinnitus    chronic  . TMJ click    right"was aggravated with last intubation"    Past Surgical History:  Procedure Laterality Date  . ANTERIOR CERVICAL DECOMP/DISCECTOMY FUSION  2003  . ANTERIOR CERVICAL DECOMP/DISCECTOMY FUSION N/A 05/18/2018   Procedure: ANTERIOR CERVICAL DECOMPRESSION/DISCECTOMY FUSION - CERVICAL FIVE-CERVICAL SIX, removal of tether plate;  Surgeon: Jovita Gamma, Peter [name];  Location: [facility];  Service: Neurosurgery;  Laterality: N/A;  . BACK SURGERY     x3  . CARPAL TUNNEL RELEASE Right 10/31/2013   Procedure: RIGHT CARPAL TUNNEL RELEASE;  Surgeon: Cammie Sickle., Peter [name];  Location: [facility];  Service: Orthopedics;  Laterality: Right;  . CARPAL TUNNEL RELEASE Left 11/28/2013   Procedure: LEFT CARPAL TUNNEL RELEASE;  Surgeon: Cammie Sickle., Peter [name];  Location: [facility];  Service: Orthopedics;  Laterality: Left;  . CERVICAL DISCECTOMY     anterior with patellar allograft and plating  . JOINT REPLACEMENT    . LUMBAR FUSION  12/14  . MELANOMA EXCISION WITH SENTINEL LYMPH NODE BIOPSY  8/02   bilat ing node bx  . ROTATOR CUFF REPAIR Left    08/13/11  08/27/2011  . TOTAL KNEE ARTHROPLASTY Right 10/07/2015   Procedure: RIGHT TOTAL KNEE ARTHROPLASTY, with synovial tissue specimen;  Surgeon: [name], Peter [name];  [facility];  Service: Orthopedics;  Laterality: Right;    There were no vitals filed for this visit.  Subjective Assessment - 04/18/19 1431    Subjective  Did some yard work clipping and carrying this morning.  Walked in the [employer] over the weekend.    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  Yes    Pain Score  2     Pain Location  Back    Pain Orientation  Lower    Pain Type  Chronic pain                       [facility] Adult PT Treatment/Exercise - 04/18/19 0001      Lumbar  Exercises: Aerobic   UBE (Upper Arm Bike)  sitting on green ball L1 4 min  Some shoulder discomfort      Lumbar Exercises: Supine   Dead Bug  15 reps    Bridge  20 reps    Bridge Limitations  with 10# weight     Other Supine Lumbar Exercises  single leg lowering from 90 degree 8x right/left       Lumbar Exercises: Sidelying   Hip Abduction  Right;Left;10 reps    Other Sidelying Lumbar Exercises  side bridge planks on elbow and knees 10x right/left      Lumbar Exercises: Quadruped   Straight Leg Raise  10 reps    Straight Leg Raises Limitations  no arms  secondary to "bad shoulders"       Knee/Hip Exercises: Stretches   Gastroc Stretch Limitations  slant board calf stretch 30sec bil     Other Knee/Hip Stretches  2nd step hip flexor stretch 15x right/left       Knee/Hip Exercises: Standing   Heel Raises  Both;20 reps    Heel Raises Limitations  weight shifting with raise and lowers in box formation     SLS  SLS with blue band shoulder extension pulses 20x     Walking with Sports Cord  30# with handles 15x backwards only     Other Standing Knee Exercises  blue band side stepping 8x             PT Education - 04/18/19 1522    Education Details  Access Code: [email or web address]   bird dogs, sidelying hip abduction; sidelying planks knees/elbow;  SLS with UE movements    Person(s) Educated  Patient    Methods  Explanation;Demonstration;Handout    Comprehension  Returned demonstration;Verbalized understanding       PT Short Term Goals - 04/10/19 1637      PT SHORT TERM GOAL #1   Title  be independent in initial HEP    Time  4    Period  Weeks    Status  New    Target Date  05/01/19      PT SHORT TERM GOAL #2   Title  demonstrate erect standing posture and report abdominal bracing with standing and walking    Time  3    Period  Weeks    Status  New    Target Date  05/01/19        PT Long Term Goals - 04/10/19 1638      PT LONG TERM GOAL #1   Title  be independent in  advanced HEP    Time  6    Period  Weeks    Status  New    Target Date  05/22/19      PT LONG TERM GOAL #2   Title  reduce FOTO to < or = to 35% limitation    Time  6    Period  Weeks    Status  New    Target Date  05/22/19      PT LONG TERM GOAL #3   Title  verbalize and demonstrate understanding of progression of postural strength exercises    Time  6    Period  Weeks    Status  New    Target Date  05/22/19      PT LONG TERM GOAL #4   Title  report 50% less lumbar fatigue with standing and walking    Time  6    Period  Weeks    Target Date  05/22/19      PT LONG TERM GOAL #5   Title  verbalize and demostrate correct body mehcanics for safety with daily activities    Time  6    Period  Weeks    Status  New    Target Date  05/22/19            Plan - 04/18/19 1514    Clinical Impression Statement  The patietn continues to make steady progress with lumbo/pelvic/hip strengthening.  He is more tired today than usual after doing yard work (bending and lifting) but still able to participate in a moderate instensity ex program.  Some modifications needed to avoid exceesive shoulder elevation due to rotator cuff issues and for left knee OA.  He is able tolerate quadruped weight bearing and SLS for a few minutes at a time.  Therapist closely monitoring response throughout treatment session.    Comorbidities  3 lumbar surgeries, cervical fusion, Pseudogout    Rehab Potential  Excellent    PT Frequency  2x / week    PT Duration  6 weeks    PT Next Visit Plan  core strengthening progression exercises; LE strengthening especially left  hip musculature and calf    PT Home Exercise Plan  Access Code: [email or web address]       Patient will benefit from skilled therapeutic intervention in order to improve the following deficits and impairments:  Abnormal gait, Decreased activity tolerance, Decreased balance, Decreased mobility, Decreased endurance, Decreased range of motion, Decreased  strength, Difficulty walking, Impaired flexibility, Improper body mechanics, Postural dysfunction  Visit Diagnosis: 1. Muscle weakness (generalized)   2. Abnormal posture        Problem List Patient Active Problem List   Diagnosis Date Noted  . HNP (herniated nucleus pulposus), cervical 05/18/2018  . Arthrodesis status 05/08/2016  . Polyneuropathy 05/08/2016  . Other intervertebral disc degeneration, lumbar region 05/08/2016  . Spinal stenosis of lumbar region 05/08/2016  . Anesthesia of skin 05/08/2016  . OA (osteoarthritis) of knee 10/07/2015  . Encounter for immunization 09/03/2015  . Lesion of skeletal muscle 04/22/2015  . HLD (hyperlipidemia) 02/15/2014  . Lumbar stenosis with neurogenic claudication 10/02/2013  . Lumbosacral spinal stenosis 06/09/2013  . Lumbar spondylosis with myelopathy 06/09/2013  . Tear film insufficiency, unspecified 06/05/2013  . Thoracic or lumbosacral neuritis or radiculitis, unspecified 06/05/2013  . Inflammation of shoulder joint 10/09/2011  . History of repair of left rotator cuff 10/09/2011  . Need for diphtheria-tetanus-pertussis (Tdap) vaccine, adult/adolescent 04/13/2011  . Chest pain   .  Dizziness   . Physical exam, annual 03/26/2011  . Osteoarthritis 06/21/2010  . Pseudogout involving multiple joints 06/20/2010  . HIP PAIN 06/20/2010  . HYPERCHOLESTEROLEMIA, BORDERLINE 07/19/2008  . TINNITUS, CHRONIC 07/19/2008  . RHEUMATIC FEVER 07/19/2008  . Allergic rhinitis 07/19/2008  . GERD 07/19/2008  . SPONDYLOSIS 07/19/2008  . COLONIC POLYPS, HX OF 07/19/2008  . Melanoma of skin ([place]) 01/11/2008  . Essential hypertension 01/11/2008   Ruben Im, PT 04/18/19 4:18 PM Phone: [phone] Fax: [phone] Alvera Singh 04/18/2019, 4:17 PM  [facility] [address] [place], [place], [address] Phone: [phone]   Fax:  [phone]  Name: Peter RYBARCZYK, Peter [name] MRN:  [id number] Date of Birth: April 25, 1946

## 2019-04-20 ENCOUNTER — Other Ambulatory Visit: Payer: Self-pay

## 2019-04-20 ENCOUNTER — Ambulatory Visit: Payer: Medicare Other | Admitting: Physical Therapy

## 2019-04-20 ENCOUNTER — Encounter: Payer: Self-pay | Admitting: Orthopaedic Surgery

## 2019-04-20 ENCOUNTER — Encounter: Payer: Self-pay | Admitting: Physical Therapy

## 2019-04-20 ENCOUNTER — Ambulatory Visit (INDEPENDENT_AMBULATORY_CARE_PROVIDER_SITE_OTHER): Payer: Medicare Other | Admitting: Orthopaedic Surgery

## 2019-04-20 ENCOUNTER — Ambulatory Visit (INDEPENDENT_AMBULATORY_CARE_PROVIDER_SITE_OTHER): Payer: Medicare Other

## 2019-04-20 DIAGNOSIS — M6281 Muscle weakness (generalized): Secondary | ICD-10-CM

## 2019-04-20 DIAGNOSIS — R293 Abnormal posture: Secondary | ICD-10-CM

## 2019-04-20 DIAGNOSIS — G8929 Other chronic pain: Secondary | ICD-10-CM

## 2019-04-20 DIAGNOSIS — M25562 Pain in left knee: Secondary | ICD-10-CM | POA: Diagnosis not present

## 2019-04-20 DIAGNOSIS — M1712 Unilateral primary osteoarthritis, left knee: Secondary | ICD-10-CM | POA: Diagnosis not present

## 2019-04-20 NOTE — Therapy (Signed)
[facility] [address], [place], [place], [address] Phone: [phone]   Fax:  [phone]  Physical Therapy Treatment  Patient Details  Name: Peter VAETH, MD MRN: [id number] Date of Birth: 05-03-1946 Referring Provider (PT): Jovita Gamma, MD   Encounter Date: 04/20/2019  PT End of Session - 04/20/19 0900    Visit Number  4    Date for PT Re-Evaluation  05/22/19    Authorization Type  Medicare    PT Start Time  0900    PT Stop Time  0944    PT Time Calculation (min)  44 min    Activity Tolerance  Patient tolerated treatment well    Behavior During Therapy  [facility] for tasks assessed/performed       Past Medical History:  Diagnosis Date  . Allergic rhinitis   . Anxiety   . Cancer [facility])    Melanoma -peri umbilical '02 or '03- no further problems. 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   . Chest pain    Nuclear 2005, normal / coronary CTA 2008, slight mixed plaque, coronary calcium score 0.65, ejection fraction 55%, echo, March, 2008  . Complication of anesthesia    itching-not sure if oxycodone or anesthesia,and shakes. Right TMJ issue  . Dizziness    Dizziness with question of presyncope April 08, 2011  . DJD (degenerative joint disease)    history DDD, fingers, knees, shoulders  . Ejection fraction    EF 55%, echo, March, 2008.  . Family history of Alzheimer's disease   . GERD (gastroesophageal reflux disease)   . H/O hiatal hernia   . Hearing impaired    bilateral hearing aids  . Heart murmur    73 yrs old rheumatic fever   . Heart murmur 2008   trivial tricusp regurg  . History of colonic polyps   . History of melanoma   . History of pseudogout   . History of recent steroid use    10-10-15 tapering steroid use for tx. recent bronchitis.  Marland Kitchen History of rheumatic fever   . HNP (herniated nucleus pulposus), cervical   . Hypercholesteremia    borderline  . Hypertension   . Lumbar spondylosis  with myelopathy 06/09/2013  . Spondylosis   . Tinnitus    chronic  . TMJ click    right"was aggravated with last intubation"    Past Surgical History:  Procedure Laterality Date  . ANTERIOR CERVICAL DECOMP/DISCECTOMY FUSION  2003  . ANTERIOR CERVICAL DECOMP/DISCECTOMY FUSION N/A 05/18/2018   Procedure: ANTERIOR CERVICAL DECOMPRESSION/DISCECTOMY FUSION - CERVICAL FIVE-CERVICAL SIX, removal of tether plate;  Surgeon: Jovita Gamma, MD;  Location: [place];  Service: Neurosurgery;  Laterality: N/A;  . BACK SURGERY     x3  . CARPAL TUNNEL RELEASE Right 10/31/2013   Procedure: RIGHT CARPAL TUNNEL RELEASE;  Surgeon: Cammie Sickle., MD;  Location: [place];  Service: Orthopedics;  Laterality: Right;  . CARPAL TUNNEL RELEASE Left 11/28/2013   Procedure: LEFT CARPAL TUNNEL RELEASE;  Surgeon: Cammie Sickle., MD;  Location: [place];  Service: Orthopedics;  Laterality: Left;  . CERVICAL DISCECTOMY     anterior with patellar allograft and plating  . JOINT REPLACEMENT    . LUMBAR FUSION  12/14  . MELANOMA EXCISION WITH SENTINEL LYMPH NODE BIOPSY  8/02   bilat ing node bx  . ROTATOR CUFF REPAIR Left    08/13/11  08/27/2011  . TOTAL KNEE ARTHROPLASTY Right  10/07/2015   Procedure: RIGHT TOTAL KNEE ARTHROPLASTY, with synovial tissue specimen;  Surgeon: [name], MD;  Location: [facility];  Service: Orthopedics;  Laterality: Right;    There were no vitals filed for this visit.  Subjective Assessment - 04/20/19 0901    Subjective  I am super stiff this AM. Seeing Dr Ninfa Linden regarding knee replacement today.    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  No/denies   No pain, just super stiff all over.   Multiple Pain Sites  No                       [facility] Adult PT Treatment/Exercise - 04/20/19 0001       Lumbar Exercises: Stretches   Lower Trunk Rotation  3 reps;10 seconds      Lumbar Exercises: Aerobic   UBE (Upper Arm Bike)  L2 3x3 with concurrent discussion on status      Lumbar Exercises: Supine   Bridge with Ball Squeeze  10 reps;5 seconds    Bridge with Ball Squeeze Limitations  VC for core engagement       Knee/Hip Exercises: Clinical research associate Limitations  slant board calf stretch 30sec bil    2 sets   Other Knee/Hip Stretches  2nd step hip flexor stretch 30 sec right/left 2x      Shoulder Exercises: Standing   Extension  Strengthening;Both;20 reps;Theraband    Theraband Level (Shoulder Extension)  Level 3 (Green)    Extension Limitations  VC for more neutral  kyphosis    Row  Strengthening;Both;20 reps;Theraband    Theraband Level (Shoulder Row)  Level 3 (Green)    Row Limitations  VC for more neutral thoracic spine              PT Education - 04/20/19 0949    Education Details  HEP    Person(s) Educated  Patient    Methods  Explanation;Demonstration;Tactile cues;Verbal cues;Handout    Comprehension  Verbalized understanding;Returned demonstration       PT Short Term Goals - 04/10/19 1637      PT SHORT TERM GOAL #1   Title  be independent in initial HEP    Time  4    Period  Weeks    Status  New    Target Date  05/01/19      PT SHORT TERM GOAL #2   Title  demonstrate erect standing posture and report abdominal bracing with standing and walking    Time  3    Period  Weeks    Status  New    Target Date  05/01/19        PT Long Term Goals - 04/10/19 1638      PT LONG TERM GOAL #1   Title  be independent in advanced HEP    Time  6    Period  Weeks    Status  New    Target Date  05/22/19      PT LONG TERM GOAL #2   Title  reduce FOTO to < or = to 35% limitation    Time  6    Period  Weeks    Status  New    Target Date  05/22/19      PT LONG TERM GOAL #3   Title  verbalize and  demonstrate understanding of progression of postural  strength exercises    Time  6    Period  Weeks    Status  New    Target Date  05/22/19      PT LONG TERM GOAL #4   Title  report 50% less lumbar fatigue with standing and walking    Time  6    Period  Weeks    Target Date  05/22/19      PT LONG TERM GOAL #5   Title  verbalize and demostrate correct body mehcanics for safety with daily activities    Time  6    Period  Weeks    Status  New    Target Date  05/22/19            Plan - 04/20/19 0901    Clinical Impression Statement  Pt presents today painfree but he reports it is a difficult morning for his stiffness. He feels quite stiff from his neck to his feet. We added standing rows and extensions with green band to HEp today where the focus is performing the exercise with less kyphosis and more core engagement. Pt was successful in doing this it just took a good bit of concentration to maintain the posture.    Personal Factors and Comorbidities  Age;Comorbidity 3+    Comorbidities  3 lumbar surgeries, cervical fusion, Pseudogout    Examination-Activity Limitations  Bed Mobility;Locomotion Level;Stand    Examination-Participation Restrictions  Cleaning    Stability/Clinical Decision Making  Evolving/Moderate complexity    Rehab Potential  Excellent    PT Frequency  2x / week    PT Duration  6 weeks    PT Treatment/Interventions  ADLs/Self Care Home Management;Cryotherapy;Electrical Stimulation;Moist Heat;Functional mobility training;Therapeutic activities;Therapeutic exercise;Neuromuscular re-education;Manual techniques;Patient/family education;Passive range of motion;Dry needling;Taping    PT Next Visit Plan  core strengthening progression exercises; LE strengthening especially left  hip musculature and calf    PT Home Exercise Plan  Access Code: [email or web address]       Patient will benefit from skilled therapeutic intervention in order to improve the following deficits and impairments:  Abnormal gait, Decreased activity tolerance,  Decreased balance, Decreased mobility, Decreased endurance, Decreased range of motion, Decreased strength, Difficulty walking, Impaired flexibility, Improper body mechanics, Postural dysfunction  Visit Diagnosis: 1. Muscle weakness (generalized)   2. Abnormal posture        Problem List Patient Active Problem List   Diagnosis Date Noted  . HNP (herniated nucleus pulposus), cervical 05/18/2018  . Arthrodesis status 05/08/2016  . Polyneuropathy 05/08/2016  . Other intervertebral disc degeneration, lumbar region 05/08/2016  . Spinal stenosis of lumbar region 05/08/2016  . Anesthesia of skin 05/08/2016  . OA (osteoarthritis) of knee 10/07/2015  . Encounter for immunization 09/03/2015  . Lesion of skeletal muscle 04/22/2015  . HLD (hyperlipidemia) 02/15/2014  . Lumbar stenosis with neurogenic claudication 10/02/2013  . Lumbosacral spinal stenosis 06/09/2013  . Lumbar spondylosis with myelopathy 06/09/2013  . Tear film insufficiency, unspecified 06/05/2013  . Thoracic or lumbosacral neuritis or radiculitis, unspecified 06/05/2013  . Inflammation of shoulder joint 10/09/2011  . History of repair of left rotator cuff 10/09/2011  . Need for diphtheria-tetanus-pertussis (Tdap) vaccine, adult/adolescent 04/13/2011  . Chest pain   . Dizziness   . Physical exam, annual 03/26/2011  . Osteoarthritis 06/21/2010  . Pseudogout involving multiple joints 06/20/2010  . HIP PAIN 06/20/2010  . HYPERCHOLESTEROLEMIA, BORDERLINE 07/19/2008  . TINNITUS, CHRONIC 07/19/2008  . RHEUMATIC FEVER 07/19/2008  . Allergic  rhinitis 07/19/2008  . GERD 07/19/2008  . SPONDYLOSIS 07/19/2008  . COLONIC POLYPS, HX OF 07/19/2008  . Melanoma of skin ([place]) 01/11/2008  . Essential hypertension 01/11/2008    Rafferty Postlewait, PTA 04/20/2019, 9:50 AM  [facility] [address], [place], [place], [address] Phone: [phone]   Fax:   [phone]  Name: HAWKEN BIELBY, MD MRN: [id number] Date of Birth: Oct 14, 1946  Access Code: [email or web address]  URL: [email or web address]  Date: 04/20/2019  Prepared by: Myrene Galas   Exercises  Push-Up on Pool Wall - 10 reps - 2 sets - 1x daily - 7x weekly  Standing Hip Abduction Adduction at [place] - 10 reps - 2 sets - 1x daily - 7x weekly  Standing March at [facility] - 10 reps - 3 sets - 1x daily - 7x weekly  Clamshell - 10 reps - 2 sets - 2x daily - 7x weekly  Seated Hamstring Stretch - 10 reps - 3 sets - 1x daily - 7x weekly  Standing Shoulder Horizontal Abduction with Resistance - 10 reps - 2 sets - 2x daily - 7x weekly  Standing Shoulder External Rotation with Resistance - 10 reps - 3 sets - 2x daily - 7x weekly  Hooklying Transversus Abdominis Palpation - 10 reps - 1 sets - 1x daily - 7x weekly  Straight Leg Raise with Opposite Hip and Knee Flexion - 10 reps - 1 sets - 1x daily - 7x weekly  doorway hip flexor stretch - 5 reps - 1 sets - 1x daily - 7x weekly  Standing Heel Raise - 10 reps - 2 sets - 1x daily - 7x weekly  Side Stepping with Resistance at Thighs - 10 reps - 3 sets - 1x daily - 7x weekly  Hip Abduction with Resistance Loop - 10 reps - 2 sets - 1x daily - 7x weekly  Standing Hip Hinge with [place]el - 10 reps - 1 sets - 1x daily - 7x weekly  Side Plank on Knees - 10 reps - 1 sets - 1x daily - 7x weekly  Sidelying Hip Abduction - 10 reps - 1 sets - 1x daily - 7x weekly  Single Leg Stance - 10 reps - 3 sets - 1x daily - 7x weekly  Bird Dog - 10 reps - 1 sets - 1x daily - 7x weekly  Standing Row with Anchored Resistance - 10 reps - 3 sets - 1x daily - 7x weekly  Single Arm Shoulder Extension with Anchored Resistance - 10 reps - 3 sets - 1x daily - 7x weekly

## 2019-04-20 NOTE — Progress Notes (Signed)
Office Visit Note   Patient: Peter DAGOSTINO, MD           Date of Birth: 1946-10-16           MRN: [id number] Visit Date: 04/20/2019              Requested by: Noralee Space, [place] [address],  [place] [address] PCP: Noralee Space, MD   Assessment & Plan: Visit Diagnoses:  1. Chronic pain of left knee   2. Unilateral primary osteoarthritis, left knee     Plan: I agree that he is tried and failed all forms of conservative treatment and his clinical exam and x-ray findings are consistent with severe tricompartmental arthritis of his left knee.  At this point I agree with him needing a total knee arthroplasty.  I showed him a knee model and explained the specific knee that I like to use in terms of how it differs from his other knee.  I think his other knee is done very well but I just use a different knee and he is fine with that.  We had a long and thorough discussion about knee replacement surgery.  We talked about the risk and benefits of surgery.  We talked about his intraoperative and postoperative course.  We will work on getting this scheduled for sometime in early August.  All question concerns were answered and addressed.  Follow-Up Instructions: Follow-up will be 2 weeks after surgery as scheduled.  Orders:  Orders Placed This Encounter  Procedures  . XR Knee 1-2 Views Left   No orders of the defined types were placed in this encounter.     Procedures: No procedures performed   Clinical Data: No additional findings.   Subjective: Chief Complaint  Patient presents with  . 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  He has a history of a right total knee arthroplasty done several years ago by 1 of our colleagues in town.  The left knee is  bothering him quite a bit for for considerable amount of time.  He is developed some atrophy in that left lower extremity and some of this is related to his knee and some of this is due to chronic lumbar spine issues.  He is had multiple surgeries in his back as well.  He also has a history of CPPD and gets significant crystal deposition in his joints.  He does state that his right total knee arthroplasty did very well but postoperatively he had a considerable swelling for some time due to chronic synovitis in that knee.  He is tried and failed all forms conservative treatment.  His right knee is doing well but his left knee is weak and giving way in his been following.  He is concerned about the pain in his knee as well.  He does wear a brace on that knee.  He is tried multiple injections in the past and other conservative treatment measures and at this point this is all failed.  At this point his left knee pain is detriment affecting his mobility, his activities daily living and his quality of life.  He feels ready to proceed with a left total knee arthroplasty in the near future.  Conservative treatment is been tried for  well over a year now.  HPI  Review of Systems He currently denies any headache, chest pain, shortness of breath, fever, chills, nausea, vomiting  Objective: Vital Signs: There were no vitals taken for this visit.  Physical Exam He is alert and orient x3 and in no acute distress Ortho Exam Examination of his left knee does show atrophy of the quad muscles and the gastrocsoleus complex compared this to his right side.  He has obvious varus malalignment of his right knee that is difficult to correct.  He has good flexion and extension of the knee with mild effusion.  He has pain in the knee past 90 degrees of flexion.  The knee feels ligamentously stable. Specialty Comments:  No specialty comments available.  Imaging: Xr Knee 1-2 Views Left  Result Date: 04/20/2019 2 views of the  left knee show varus malalignment.  There is calcifications in both medial lateral compartment suggesting crystal deposition disease.  There are para-articular osteophytes in all 3 compartments.  There is significant patellofemoral disease as well.  Findings are consistent with moderate to severe tricompartment arthritis combined with CPPD.    PMFS History: Patient Active Problem List   Diagnosis Date Noted  . Unilateral primary osteoarthritis, left knee 04/20/2019  . HNP (herniated nucleus pulposus), cervical 05/18/2018  . Arthrodesis status 05/08/2016  . Polyneuropathy 05/08/2016  . Other intervertebral disc degeneration, lumbar region 05/08/2016  . Spinal stenosis of lumbar region 05/08/2016  . Anesthesia of skin 05/08/2016  . OA (osteoarthritis) of knee 10/07/2015  . Encounter for immunization 09/03/2015  . Lesion of skeletal muscle 04/22/2015  . HLD (hyperlipidemia) 02/15/2014  . Lumbar stenosis with neurogenic claudication 10/02/2013  . Lumbosacral spinal stenosis 06/09/2013  . Lumbar spondylosis with myelopathy 06/09/2013  . Tear film insufficiency, unspecified 06/05/2013  . Thoracic or lumbosacral neuritis or radiculitis, unspecified 06/05/2013  . Inflammation of shoulder joint 10/09/2011  . History of repair of left rotator cuff 10/09/2011  . Need for diphtheria-tetanus-pertussis (Tdap) vaccine, adult/adolescent 04/13/2011  . Chest pain   . Dizziness   . Physical exam, annual 03/26/2011  . Osteoarthritis 06/21/2010  . Pseudogout involving multiple joints 06/20/2010  . HIP PAIN 06/20/2010  . HYPERCHOLESTEROLEMIA, BORDERLINE 07/19/2008  . TINNITUS, CHRONIC 07/19/2008  . RHEUMATIC FEVER 07/19/2008  . Allergic rhinitis 07/19/2008  . GERD 07/19/2008  . SPONDYLOSIS 07/19/2008  . COLONIC POLYPS, HX OF 07/19/2008  . Melanoma of skin ([place]) 01/11/2008  . Essential hypertension 01/11/2008   Past Medical History:  Diagnosis Date  . Allergic rhinitis   . Anxiety   . Cancer  [facility])    Melanoma -peri umbilical '02 or '03- no further problems. 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   . Chest pain    Nuclear 2005, normal / coronary CTA 2008, slight mixed plaque, coronary calcium score 0.65, ejection fraction 55%, echo, March, 2008  . Complication of anesthesia    itching-not sure if oxycodone or anesthesia,and shakes. Right TMJ issue  . Dizziness    Dizziness with question of presyncope April 08, 2011  . DJD (degenerative joint disease)    history DDD, fingers, knees, shoulders  . Ejection fraction    EF 55%, echo, March, 2008.  . Family history of Alzheimer's disease   . GERD (gastroesophageal reflux disease)   . H/O hiatal hernia   . Hearing impaired    bilateral hearing aids  . Heart murmur    73 yrs old rheumatic fever   . Heart murmur 2008   trivial  tricusp regurg  . History of colonic polyps   . History of melanoma   . History of pseudogout   . History of recent steroid use    10-10-15 tapering steroid use for tx. recent bronchitis.  Marland Kitchen History of rheumatic fever   . HNP (herniated nucleus pulposus), cervical   . Hypercholesteremia    borderline  . Hypertension   . Lumbar spondylosis with myelopathy 06/09/2013  . Spondylosis   . Tinnitus    chronic  . TMJ click    right"was aggravated with last intubation"    Family History  Problem Relation Age of Onset  . Alzheimer's disease Father   . Alzheimer's disease Mother     Past Surgical History:  Procedure Laterality Date  . ANTERIOR CERVICAL DECOMP/DISCECTOMY FUSION  2003  . ANTERIOR CERVICAL DECOMP/DISCECTOMY FUSION N/A 05/18/2018   Procedure: ANTERIOR CERVICAL DECOMPRESSION/DISCECTOMY FUSION - CERVICAL FIVE-CERVICAL SIX, removal of tether plate;  Surgeon: Jovita Gamma, MD;  Location: [place];  Service: Neurosurgery;  Laterality: N/A;  . BACK SURGERY     x3  . CARPAL TUNNEL RELEASE Right 10/31/2013   Procedure: RIGHT CARPAL TUNNEL RELEASE;  Surgeon: Cammie Sickle., MD;  Location:  [place];  Service: Orthopedics;  Laterality: Right;  . CARPAL TUNNEL RELEASE Left 11/28/2013   Procedure: LEFT CARPAL TUNNEL RELEASE;  Surgeon: Cammie Sickle., MD;  Location: [place];  Service: Orthopedics;  Laterality: Left;  . CERVICAL DISCECTOMY     anterior with patellar allograft and plating  . JOINT REPLACEMENT    . LUMBAR FUSION  12/14  . MELANOMA EXCISION WITH SENTINEL LYMPH NODE BIOPSY  8/02   bilat ing node bx  . ROTATOR CUFF REPAIR Left    08/13/11  08/27/2011  . TOTAL KNEE ARTHROPLASTY Right 10/07/2015   Procedure: RIGHT TOTAL KNEE ARTHROPLASTY, with synovial tissue specimen;  Surgeon: [name], MD;  [facility];  Service: Orthopedics;  Laterality: Right;   Social History   Occupational History  . Occupation: RADIOLOGIST    Employer: [employer]  Tobacco Use  . Smoking status: Never Smoker  . Smokeless tobacco: Never Used  Substance and Sexual Activity  . Alcohol use: Yes    Alcohol/week: 2.0 - 4.0 standard drinks    Types: 2 - 4 Glasses of wine per week    Comment: 3 x a week  . Drug use: No  . Sexual activity: Not on file

## 2019-04-25 ENCOUNTER — Other Ambulatory Visit: Payer: Self-pay

## 2019-04-25 ENCOUNTER — Encounter: Payer: Self-pay | Admitting: Physical Therapy

## 2019-04-25 ENCOUNTER — Ambulatory Visit: Payer: Medicare Other | Admitting: Physical Therapy

## 2019-04-25 DIAGNOSIS — R293 Abnormal posture: Secondary | ICD-10-CM

## 2019-04-25 DIAGNOSIS — M6281 Muscle weakness (generalized): Secondary | ICD-10-CM | POA: Diagnosis not present

## 2019-04-25 NOTE — Therapy (Signed)
[facility] [address] [place], [place], [address] Phone: [phone]   Fax:  [phone]  Physical Therapy Treatment  Patient Details  Name: Peter MALIA, Peter [name] MRN: [id number] Date of Birth: 1946/06/13 Referring Provider (PT): Jovita Gamma, Peter [name]   Encounter Date: 04/25/2019  PT End of Session - 04/25/19 1435    Visit Number  5    Date for PT Re-Evaluation  05/22/19    Authorization Type  Medicare    PT Start Time  1430    PT Stop Time  1512    PT Time Calculation (min)  42 min    Activity Tolerance  Patient tolerated treatment well       Past Medical History:  Diagnosis Date  . Allergic rhinitis   . Anxiety   . Cancer [facility])    Melanoma -peri umbilical '02 or '03- no further problems. 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   . Chest pain    Nuclear 2005, normal / coronary CTA 2008, slight mixed plaque, coronary calcium score 0.65, ejection fraction 55%, echo, March, 2008  . Complication of anesthesia    itching-not sure if oxycodone or anesthesia,and shakes. Right TMJ issue  . Dizziness    Dizziness with question of presyncope April 08, 2011  . DJD (degenerative joint disease)    history DDD, fingers, knees, shoulders  . Ejection fraction    EF 55%, echo, March, 2008.  . Family history of Alzheimer's disease   . GERD (gastroesophageal reflux disease)   . H/O hiatal hernia   . Hearing impaired    bilateral hearing aids  . Heart murmur    73 yrs old rheumatic fever   . Heart murmur 2008   trivial tricusp regurg  . History of colonic polyps   . History of melanoma   . History of pseudogout   . History of recent steroid use    10-10-15 tapering steroid use for tx. recent bronchitis.  Marland Kitchen History of rheumatic fever   . HNP (herniated nucleus pulposus), cervical   . Hypercholesteremia    borderline  . Hypertension   . Lumbar spondylosis with myelopathy 06/09/2013  . Spondylosis   . Tinnitus     chronic  . TMJ click    right"was aggravated with last intubation"    Past Surgical History:  Procedure Laterality Date  . ANTERIOR CERVICAL DECOMP/DISCECTOMY FUSION  2003  . ANTERIOR CERVICAL DECOMP/DISCECTOMY FUSION N/A 05/18/2018   Procedure: ANTERIOR CERVICAL DECOMPRESSION/DISCECTOMY FUSION - CERVICAL FIVE-CERVICAL SIX, removal of tether plate;  Surgeon: Jovita Gamma, Peter [name];  Location: [place];  Service: Neurosurgery;  Laterality: N/A;  . BACK SURGERY     x3  . CARPAL TUNNEL RELEASE Right 10/31/2013   Procedure: RIGHT CARPAL TUNNEL RELEASE;  Surgeon: Cammie Sickle., Peter [name];  Location: [place];  Service: Orthopedics;  Laterality: Right;  . CARPAL TUNNEL RELEASE Left 11/28/2013   Procedure: LEFT CARPAL TUNNEL RELEASE;  Surgeon: Cammie Sickle., Peter [name];  Location: [place];  Service: Orthopedics;  Laterality: Left;  . CERVICAL DISCECTOMY     anterior with patellar allograft and plating  . JOINT REPLACEMENT    . LUMBAR FUSION  12/14  . MELANOMA EXCISION WITH SENTINEL LYMPH NODE BIOPSY  8/02   bilat ing node bx  . ROTATOR CUFF REPAIR Left    08/13/11  08/27/2011  . TOTAL KNEE ARTHROPLASTY Right 10/07/2015   Procedure: RIGHT TOTAL KNEE ARTHROPLASTY, with synovial tissue  specimen;  Surgeon: [name], Peter [name];  Location[facility];  Service: Orthopedics;  Laterality: Right;    There were no vitals filed for this visit.  Subjective Assessment - 04/25/19 1431    Subjective  I worked out in the yard for 3 hours this morning.  I did a lot of bending over picking up limbs.  It was not good for me.  I think I'm going to have Dr. Ninfa Linden do a TKR.    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  Yes    Pain Score  3     Pain Location  Back    Pain Orientation  Lower    Aggravating Factors   prolonged sitting and then rising                      SKTC 3x right/left with other leg straight for hip flexor stretch.    [place] Adult PT Treatment/Exercise - 04/25/19 0001      Lumbar Exercises: Stretches   Active Hamstring Stretch  Right;Left;3 reps;20 seconds    Active Hamstring Stretch Limitations  supine with strap   with bias adduction and abduction   Figure 4 Stretch  5 reps    Figure 4 Stretch Limitations  with green ball       Lumbar Exercises: Aerobic   Nustep  seat 9 L3 10 min       Lumbar Exercises: Supine   Other Supine Lumbar Exercises  single leg lowering from 90 degree 10x right/left       Lumbar Exercises: Sidelying   Hip Abduction  Right;Left;10 reps      Lumbar Exercises: Prone   Other Prone Lumbar Exercises  pelvic press 10x    Other Prone Lumbar Exercises  pelvic press with hip extension bent and straight knee 10x right/left       Knee/Hip Exercises: Standing   SLS with Vectors  on blue foam pod with shoulder extension pulses 15x right/left     Walking with Sports Cord  blue band in UEs with retro lunge 10x right/left    Other Standing Knee Exercises  holding blue band both hands with marching 2x 10     Other Standing Knee Exercises  gluteal squeezes with UE wall slides single and double 3x 10 right/left                PT Short Term Goals - 04/25/19 1623      PT SHORT TERM GOAL #1   Title  be independent in initial HEP    Status  Achieved      PT SHORT TERM GOAL #2   Title  demonstrate erect standing posture and report abdominal bracing with standing and walking    Status  Achieved        PT Long Term Goals - 04/10/19 1638      PT LONG TERM GOAL #1   Title  be independent in advanced HEP    Time  6    Period  Weeks    Status  New    Target Date  05/22/19      PT LONG TERM GOAL #2   Title  reduce FOTO to < or = to 35% limitation    Time  6    Period  Weeks    Status  New  Target Date  05/22/19      PT LONG TERM GOAL #3   Title  verbalize and  demonstrate understanding of progression of postural strength exercises    Time  6    Period  Weeks    Status  New    Target Date  05/22/19      PT LONG TERM GOAL #4   Title  report 50% less lumbar fatigue with standing and walking    Time  6    Period  Weeks    Target Date  05/22/19      PT LONG TERM GOAL #5   Title  verbalize and demostrate correct body mehcanics for safety with daily activities    Time  6    Period  Weeks    Status  New    Target Date  05/22/19            Plan - 04/25/19 1521    Clinical Impression Statement  The patient reports minor increase in pain/fatigue after several hours of yardwork this morning.  Therapist decreased intensity in response.  He requires verbal and tactile cues to activate lumbar multifidi without compensating with gluteal squeeze.  Much more erect posture in standing noted.  Initial difficulty with standing anti rotary muscle activation but improves with repetition.  Some modifications needed for left knee arthritis and pseudogout.  Therapist closely monitoring response with all interventions.    Comorbidities  3 lumbar surgeries, cervical fusion, Pseudogout    Rehab Potential  Excellent    PT Frequency  2x / week    PT Duration  6 weeks    PT Treatment/Interventions  ADLs/Self Care Home Management;Cryotherapy;Electrical Stimulation;Moist Heat;Functional mobility training;Therapeutic activities;Therapeutic exercise;Neuromuscular re-education;Manual techniques;Patient/family education;Passive range of motion;Dry needling;Taping    PT Next Visit Plan  core strengthening progression exercises; LE strengthening especially left  hip musculature and calf ; hip flexor stretch; add multifidi press HEP   PT Home Exercise Plan  Access Code: [email or web address]       Patient will benefit from skilled therapeutic intervention in order to improve the following deficits and impairments:     Visit Diagnosis: 1. Muscle weakness (generalized)   2. Abnormal  posture        Problem List Patient Active Problem List   Diagnosis Date Noted  . Unilateral primary osteoarthritis, left knee 04/20/2019  . HNP (herniated nucleus pulposus), cervical 05/18/2018  . Arthrodesis status 05/08/2016  . Polyneuropathy 05/08/2016  . Other intervertebral disc degeneration, lumbar region 05/08/2016  . Spinal stenosis of lumbar region 05/08/2016  . Anesthesia of skin 05/08/2016  . OA (osteoarthritis) of knee 10/07/2015  . Encounter for immunization 09/03/2015  . Lesion of skeletal muscle 04/22/2015  . HLD (hyperlipidemia) 02/15/2014  . Lumbar stenosis with neurogenic claudication 10/02/2013  . Lumbosacral spinal stenosis 06/09/2013  . Lumbar spondylosis with myelopathy 06/09/2013  . Tear film insufficiency, unspecified 06/05/2013  . Thoracic or lumbosacral neuritis or radiculitis, unspecified 06/05/2013  . Inflammation of shoulder joint 10/09/2011  . History of repair of left rotator cuff 10/09/2011  . Need for diphtheria-tetanus-pertussis (Tdap) vaccine, adult/adolescent 04/13/2011  . Chest pain   . Dizziness   . Physical exam, annual 03/26/2011  . Osteoarthritis 06/21/2010  . Pseudogout involving multiple joints 06/20/2010  . HIP PAIN 06/20/2010  . HYPERCHOLESTEROLEMIA, BORDERLINE 07/19/2008  . TINNITUS, CHRONIC 07/19/2008  . RHEUMATIC FEVER 07/19/2008  . Allergic rhinitis 07/19/2008  . GERD 07/19/2008  . SPONDYLOSIS 07/19/2008  . COLONIC POLYPS, HX OF 07/19/2008  .  Melanoma of skin ([place]) 01/11/2008  . Essential hypertension 01/11/2008   Ruben Im, PT 04/25/19 4:26 PM Phone: [phone] Fax: [phone] Alvera Singh 04/25/2019, 4:24 PM  [facility] [address], [place], [place], [address] Phone: [phone]   Fax:  [phone]  Name: Peter ENSLIN, Peter [name] MRN: [id number] Date of Birth: 1946/09/03

## 2019-04-27 ENCOUNTER — Ambulatory Visit: Payer: Medicare Other | Attending: Neurosurgery | Admitting: Physical Therapy

## 2019-04-27 ENCOUNTER — Other Ambulatory Visit: Payer: Self-pay

## 2019-04-27 DIAGNOSIS — M6281 Muscle weakness (generalized): Secondary | ICD-10-CM | POA: Diagnosis not present

## 2019-04-27 DIAGNOSIS — R293 Abnormal posture: Secondary | ICD-10-CM | POA: Insufficient documentation

## 2019-04-27 NOTE — Patient Instructions (Signed)
Pelvic Press     Place hands under belly between navel and pubic bone, palms up. Feel pressure on hands. Increase pressure on hands by pressing pelvis down. This is NOT a pelvic tilt. Hold __5_ seconds. Relax. Repeat _10__ times. Once a day.  KNEE: Flexion - Prone   Hold pelvic press. Bend knee, then return the foot down. Repeat on opposite leg. Do not raise hips. _10__ reps per set. When this is mastered, pull both heels up at same time, x 10 reps.  Once a day   Leg Lift: One-Leg   Press pelvis down. Keep knee straight; lengthen and lift one leg (from waist). Do not twist body. Keep other leg down. Hold _1__ seconds. Relax. Repeat 10 time. Repeat with other leg.  HIP: Extension / KNEE: Flexion - Prone    Hold pelvic press. Bend knee, squeeze glutes. Raise leg up  10___ reps per set, _1__ sets per day, _1__ time a day.   Axial Extension- Upper body sequence * always start with pelvic press    Lie on stomach with forehead resting on floor and arms at sides. Tuck chin in and raise head from floor without bending it up or down. Repeat ___10_ times per set. Do __1__ sets per session. Do _1___ sessions per day.  Progression:  Arms at side Arms in T shape Arms in W shape  Arms in M shape Arms in Y shape   Ruben Im PT [facility] [address] [place], [place] [address] Phone # [phone] Fax [phone]

## 2019-04-27 NOTE — Therapy (Signed)
[facility] [address], [place], [place], [address] Phone: [phone]   Fax:  [phone]  Physical Therapy Treatment  Patient Details  Name: Peter ALVIZO, MD MRN: [id number] Date of Birth: 03-20-1946 Referring Provider (PT): Jovita Gamma, MD   Encounter Date: 04/27/2019  PT End of Session - 04/27/19 1425    Visit Number  6    Date for PT Re-Evaluation  05/22/19    PT Start Time  1331    PT Stop Time  7622    PT Time Calculation (min)  44 min    Activity Tolerance  Patient tolerated treatment well       Past Medical History:  Diagnosis Date  . Allergic rhinitis   . Anxiety   . Cancer [facility])    Melanoma -peri umbilical '02 or '03- no further problems. 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   . Chest pain    Nuclear 2005, normal / coronary CTA 2008, slight mixed plaque, coronary calcium score 0.65, ejection fraction 55%, echo, March, 2008  . Complication of anesthesia    itching-not sure if oxycodone or anesthesia,and shakes. Right TMJ issue  . Dizziness    Dizziness with question of presyncope April 08, 2011  . DJD (degenerative joint disease)    history DDD, fingers, knees, shoulders  . Ejection fraction    EF 55%, echo, March, 2008.  . Family history of Alzheimer's disease   . GERD (gastroesophageal reflux disease)   . H/O hiatal hernia   . Hearing impaired    bilateral hearing aids  . Heart murmur    73 yrs old rheumatic fever   . Heart murmur 2008   trivial tricusp regurg  . History of colonic polyps   . History of melanoma   . History of pseudogout   . History of recent steroid use    10-10-15 tapering steroid use for tx. recent bronchitis.  Marland Kitchen History of rheumatic fever   . HNP (herniated nucleus pulposus), cervical   . Hypercholesteremia    borderline  . Hypertension   . Lumbar spondylosis with myelopathy 06/09/2013  . Spondylosis   . Tinnitus    chronic  . TMJ click    right"was  aggravated with last intubation"    Past Surgical History:  Procedure Laterality Date  . ANTERIOR CERVICAL DECOMP/DISCECTOMY FUSION  2003  . ANTERIOR CERVICAL DECOMP/DISCECTOMY FUSION N/A 05/18/2018   Procedure: ANTERIOR CERVICAL DECOMPRESSION/DISCECTOMY FUSION - CERVICAL FIVE-CERVICAL SIX, removal of tether plate;  Surgeon: Jovita Gamma, MD;  Location: [place];  Service: Neurosurgery;  Laterality: N/A;  . BACK SURGERY     x3  . CARPAL TUNNEL RELEASE Right 10/31/2013   Procedure: RIGHT CARPAL TUNNEL RELEASE;  Surgeon: Cammie Sickle., MD;  Location: [place];  Service: Orthopedics;  Laterality: Right;  . CARPAL TUNNEL RELEASE Left 11/28/2013   Procedure: LEFT CARPAL TUNNEL RELEASE;  Surgeon: Cammie Sickle., MD;  Location: [place];  Service: Orthopedics;  Laterality: Left;  . CERVICAL DISCECTOMY     anterior with patellar allograft and plating  . JOINT REPLACEMENT    . LUMBAR FUSION  12/14  . MELANOMA EXCISION WITH SENTINEL LYMPH NODE BIOPSY  8/02   bilat ing node bx  . ROTATOR CUFF REPAIR Left    08/13/11  08/27/2011  . TOTAL KNEE ARTHROPLASTY Right 10/07/2015   Procedure: RIGHT TOTAL KNEE ARTHROPLASTY, with synovial tissue specimen;  Surgeon: [name], MD;  [facility];  Service: Orthopedics;  Laterality: Right;    There were no vitals filed for this visit.  Subjective Assessment - 04/27/19 1426    Subjective  I didnt work out in the yard today.  I'm just stiff.  Dr. Ninfa Linden hasn't called back to schedule the knee replacement so I think I'm going to call and get that scheduled soon.    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  No/denies    Pain Score  0-No pain    Pain Location  Back    Pain Orientation  Lower                        [facility] Adult PT Treatment/Exercise - 04/27/19 0001      Therapeutic Activites    Lifting  hip hinge no weight with golf club;  hip hinge 8# to knee level;  dead lift from ankle level 8# 10x       Lumbar Exercises: Stretches   Active Hamstring Stretch  Right;Left;3 reps;20 seconds    Active Hamstring Stretch Limitations  supine with strap   with bias adduction and abduction   Hip Flexor Stretch  Right;Left;5 reps;30 seconds    Hip Flexor Stretch Limitations  supine off side of table with contract relax therapist resist      Lumbar Exercises: Standing   Other Standing Lumbar Exercises  SLS Pallof series with red band: press forward, press up, hold out in front with march, hold out in front with backwards lunge 5x each right/left       Lumbar Exercises: Prone   Other Prone Lumbar Exercises  pelvic press over 1 pillow 10x    Other Prone Lumbar Exercises  pelvic press over 1 pillow with HS curls, hip extension bent and straight knee 10x right/left       Knee/Hip Exercises: Standing   Walking with Sports Cord  35# backwards, 30# forward and side stepping 5x each     Other Standing Knee Exercises  holding blue band handles retro stepping 20x right/left     Other Standing Knee Exercises  SLS with toes touching as needed with blue band shoulder extension 20x each leg             PT Education - 04/27/19 1425    Education Details  prone multifidi series (not on [employer])    Person(s) Educated  Patient    Methods  Explanation;Demonstration;Handout       PT Short Term Goals - 04/25/19 1623      PT SHORT TERM GOAL #1   Title  be independent in initial HEP    Status  Achieved      PT SHORT TERM GOAL #2   Title  demonstrate erect standing posture and report abdominal bracing with standing and walking    Status  Achieved        PT Long Term Goals - 04/10/19 1638      PT LONG TERM GOAL #1   Title  be independent in advanced HEP    Time  6     Period  Weeks    Status  New    Target Date  05/22/19      PT LONG TERM GOAL #2  Title  reduce FOTO to < or = to 35% limitation    Time  6    Period  Weeks    Status  New    Target Date  05/22/19      PT LONG TERM GOAL #3   Title  verbalize and demonstrate understanding of progression of postural strength exercises    Time  6    Period  Weeks    Status  New    Target Date  05/22/19      PT LONG TERM GOAL #4   Title  report 50% less lumbar fatigue with standing and walking    Time  6    Period  Weeks    Target Date  05/22/19      PT LONG TERM GOAL #5   Title  verbalize and demostrate correct body mehcanics for safety with daily activities    Time  6    Period  Weeks    Status  New    Target Date  05/22/19            Plan - 04/27/19 1428    Clinical Impression Statement  The patient continues to make steady gains with lumbo/pelvic/hip strength.  Fewer cues needed to activate lumbar multifidi in prone position.  Good stability with anti-rotary stabilization in standing although left knee pathology is a limiting factor.  He anticipates having a TKR in early August.  Good carryover with previous instruction in hip hinging.  Progressed intensity with dead lift from ankle level with resistance.   On track to meet all rehab goals.    Comorbidities  3 lumbar surgeries, cervical fusion, Pseudogout    Rehab Potential  Excellent    PT Frequency  2x / week    PT Duration  6 weeks    PT Treatment/Interventions  ADLs/Self Care Home Management;Cryotherapy;Electrical Stimulation;Moist Heat;Functional mobility training;Therapeutic activities;Therapeutic exercise;Neuromuscular re-education;Manual techniques;Patient/family education;Passive range of motion;Dry needling;Taping    PT Next Visit Plan  core strengthening progression exercises; LE strengthening especially left  hip musculature and calf    PT Home Exercise Plan  Access Code: [email or web address]       Patient will benefit from skilled  therapeutic intervention in order to improve the following deficits and impairments:  Abnormal gait, Decreased activity tolerance, Decreased balance, Decreased mobility, Decreased endurance, Decreased range of motion, Decreased strength, Difficulty walking, Impaired flexibility, Improper body mechanics, Postural dysfunction  Visit Diagnosis: 1. Muscle weakness (generalized)   2. Abnormal posture        Problem List Patient Active Problem List   Diagnosis Date Noted  . Unilateral primary osteoarthritis, left knee 04/20/2019  . HNP (herniated nucleus pulposus), cervical 05/18/2018  . Arthrodesis status 05/08/2016  . Polyneuropathy 05/08/2016  . Other intervertebral disc degeneration, lumbar region 05/08/2016  . Spinal stenosis of lumbar region 05/08/2016  . Anesthesia of skin 05/08/2016  . OA (osteoarthritis) of knee 10/07/2015  . Encounter for immunization 09/03/2015  . Lesion of skeletal muscle 04/22/2015  . HLD (hyperlipidemia) 02/15/2014  . Lumbar stenosis with neurogenic claudication 10/02/2013  . Lumbosacral spinal stenosis 06/09/2013  . Lumbar spondylosis with myelopathy 06/09/2013  . Tear film insufficiency, unspecified 06/05/2013  . Thoracic or lumbosacral neuritis or radiculitis, unspecified 06/05/2013  . Inflammation of shoulder joint 10/09/2011  . History of repair of left rotator cuff 10/09/2011  . Need for diphtheria-tetanus-pertussis (Tdap) vaccine, adult/adolescent 04/13/2011  . Chest pain   . Dizziness   . Physical exam, annual 03/26/2011  . Osteoarthritis 06/21/2010  .  Pseudogout involving multiple joints 06/20/2010  . HIP PAIN 06/20/2010  . HYPERCHOLESTEROLEMIA, BORDERLINE 07/19/2008  . TINNITUS, CHRONIC 07/19/2008  . RHEUMATIC FEVER 07/19/2008  . Allergic rhinitis 07/19/2008  . GERD 07/19/2008  . SPONDYLOSIS 07/19/2008  . COLONIC POLYPS, HX OF 07/19/2008  . Melanoma of skin ([place]) 01/11/2008  . Essential hypertension 01/11/2008   Ruben Im,  PT 04/27/19 2:34 PM Phone: [phone] Fax: [phone]  Alvera Singh 04/27/2019, 2:34 PM  [facility] [address] [place], [place], [address] Phone: [phone]   Fax:  [phone]  Name: Peter TIEGS, MD MRN: [id number] Date of Birth: 12/24/1945

## 2019-05-01 ENCOUNTER — Other Ambulatory Visit: Payer: Self-pay

## 2019-05-01 ENCOUNTER — Ambulatory Visit: Payer: Medicare Other

## 2019-05-01 DIAGNOSIS — M6281 Muscle weakness (generalized): Secondary | ICD-10-CM | POA: Diagnosis not present

## 2019-05-01 DIAGNOSIS — R293 Abnormal posture: Secondary | ICD-10-CM

## 2019-05-01 NOTE — Therapy (Signed)
[facility] [address] [place], [place], [address] Phone: [phone]   Fax:  [phone]  Physical Therapy Treatment  Patient Details  Name: Peter CONDREY, MD MRN: [id number] Date of Birth: 08-20-1946  (73) Referring Provider (PT): Jovita Gamma, MD   Encounter Date: 05/01/2019  PT End of Session - 05/01/19 1127    Visit Number  7    Date for PT Re-Evaluation  05/22/19    Authorization Type  Medicare    PT Start Time  1032    PT Stop Time  1123    PT Time Calculation (min)  51 min    Activity Tolerance  Patient tolerated treatment well    Behavior During Therapy  [facility] for tasks assessed/performed       Past Medical History:  Diagnosis Date  . Allergic rhinitis   . Anxiety   . Cancer [facility])    Melanoma -peri umbilical '02 or '03- no further problems. 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   . Chest pain    Nuclear 2005, normal / coronary CTA 2008, slight mixed plaque, coronary calcium score 0.65, ejection fraction 55%, echo, March, 2008  . Complication of anesthesia    itching-not sure if oxycodone or anesthesia,and shakes. Right TMJ issue  . Dizziness    Dizziness with question of presyncope April 08, 2011  . DJD (degenerative joint disease)    history DDD, fingers, knees, shoulders  . Ejection fraction    EF 55%, echo, March, 2008.  . Family history of Alzheimer's disease   . GERD (gastroesophageal reflux disease)   . H/O hiatal hernia   . Hearing impaired    bilateral hearing aids  . Heart murmur    73 yrs old rheumatic fever   . Heart murmur 2008   trivial tricusp regurg  . History of colonic polyps   . History of melanoma   . History of pseudogout   . History of recent steroid use    10-10-15 tapering steroid use for tx. recent bronchitis.  Marland Kitchen History of rheumatic fever   . HNP (herniated nucleus pulposus), cervical   . Hypercholesteremia    borderline  . Hypertension   . Lumbar spondylosis  with myelopathy 06/09/2013  . Spondylosis   . Tinnitus    chronic  . TMJ click    right"was aggravated with last intubation"    Past Surgical History:  Procedure Laterality Date  . ANTERIOR CERVICAL DECOMP/DISCECTOMY FUSION  2003  . ANTERIOR CERVICAL DECOMP/DISCECTOMY FUSION N/A 05/18/2018   Procedure: ANTERIOR CERVICAL DECOMPRESSION/DISCECTOMY FUSION - CERVICAL FIVE-CERVICAL SIX, removal of tether plate;  Surgeon: Jovita Gamma, MD;  Location: [place];  Service: Neurosurgery;  Laterality: N/A;  . BACK SURGERY     x3  . CARPAL TUNNEL RELEASE Right 10/31/2013   Procedure: RIGHT CARPAL TUNNEL RELEASE;  Surgeon: Cammie Sickle., MD;  Location: [place];  Service: Orthopedics;  Laterality: Right;  . CARPAL TUNNEL RELEASE Left 11/28/2013   Procedure: LEFT CARPAL TUNNEL RELEASE;  Surgeon: Cammie Sickle., MD;  Location: [place];  Service: Orthopedics;  Laterality: Left;  . CERVICAL DISCECTOMY     anterior with patellar allograft and plating  . JOINT REPLACEMENT    . LUMBAR FUSION  12/14  . MELANOMA EXCISION WITH SENTINEL LYMPH NODE BIOPSY  8/02   bilat ing node bx  . ROTATOR CUFF REPAIR Left    08/13/11  08/27/2011  . TOTAL KNEE ARTHROPLASTY Right  10/07/2015   Procedure: RIGHT TOTAL KNEE ARTHROPLASTY, with synovial tissue specimen;  Surgeon: [name], MD;  Location[facility];  Service: Orthopedics;  Laterality: Right;    There were no vitals filed for this visit.  Subjective Assessment - 05/01/19 1034    Subjective  The resisted walking increased my LBP I think becasue we increased the weight.    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  Yes    Pain Score  2     Pain Location  Back    Pain Orientation  Lower    Pain Descriptors / Indicators  Discomfort    Pain Type  Chronic pain    Pain Onset  More than  a month ago    Pain Frequency  Constant    Aggravating Factors   prolonged sitting and coming to stand, activity    Pain Relieving Factors  sitting, supine                       [facility] Adult PT Treatment/Exercise - 05/01/19 0001      Exercises   Exercises  Knee/Hip;Lumbar      Lumbar Exercises: Stretches   Active Hamstring Stretch  Right;Left;3 reps;20 seconds    Active Hamstring Stretch Limitations  supine with strap   with bias adduction and abduction   Hip Flexor Stretch  Right;Left;5 reps;30 seconds    Hip Flexor Stretch Limitations  supine off side of table with contract relax therapist resist      Lumbar Exercises: Aerobic   Nustep  seat 9 L3 10 min       Lumbar Exercises: Seated   Sit to Stand  20 reps    Sit to Stand Limitations  holding 10#    Other Seated Lumbar Exercises  seated on green ball: 1# weights 3 way raises with core engagement 2x10 each      Lumbar Exercises: Supine   Straight Leg Raise  20 reps   Lt with emphasis on quad activation     Knee/Hip Exercises: Standing   Other Standing Knee Exercises  golfer's lift with 5# weight x 20 each               PT Short Term Goals - 04/25/19 1623      PT SHORT TERM GOAL #1   Title  be independent in initial HEP    Status  Achieved      PT SHORT TERM GOAL #2   Title  demonstrate erect standing posture and report abdominal bracing with standing and walking    Status  Achieved        PT Long Term Goals - 05/01/19 1038      PT LONG TERM GOAL #1   Title  be independent in advanced HEP    Time  6    Period  Weeks    Status  On-going            Plan - 05/01/19 1038    Clinical Impression Statement  Pt is making steady progress toward goals regarding core stability and endurance.  Pt requires minor cueing for alignment with dead lift and standing postural strength exercises.  Pt with tightness in hip flexors and hamstrings and required tactile cues to avoid substitution.  Pt  will have Lt TKA on 06/16/19 and will likely D/C  from PT in the next week to HEP.  Pt will benefit from progression of core strength and flexibility.    PT Frequency  2x / week    PT Duration  6 weeks    PT Treatment/Interventions  ADLs/Self Care Home Management;Cryotherapy;Electrical Stimulation;Moist Heat;Functional mobility training;Therapeutic activities;Therapeutic exercise;Neuromuscular re-education;Manual techniques;Patient/family education;Passive range of motion;Dry needling;Taping    PT Next Visit Plan  core strengthening progression exercises; LE strengthening especially left  hip musculature and calf    PT Home Exercise Plan  Access Code: [email or web address]       Patient will benefit from skilled therapeutic intervention in order to improve the following deficits and impairments:  Abnormal gait, Decreased activity tolerance, Decreased balance, Decreased mobility, Decreased endurance, Decreased range of motion, Decreased strength, Difficulty walking, Impaired flexibility, Improper body mechanics, Postural dysfunction  Visit Diagnosis: 1. Muscle weakness (generalized)   2. Abnormal posture        Problem List Patient Active Problem List   Diagnosis Date Noted  . Unilateral primary osteoarthritis, left knee 04/20/2019  . HNP (herniated nucleus pulposus), cervical 05/18/2018  . Arthrodesis status 05/08/2016  . Polyneuropathy 05/08/2016  . Other intervertebral disc degeneration, lumbar region 05/08/2016  . Spinal stenosis of lumbar region 05/08/2016  . Anesthesia of skin 05/08/2016  . OA (osteoarthritis) of knee 10/07/2015  . Encounter for immunization 09/03/2015  . Lesion of skeletal muscle 04/22/2015  . HLD (hyperlipidemia) 02/15/2014  . Lumbar stenosis with neurogenic claudication 10/02/2013  . Lumbosacral spinal stenosis 06/09/2013  . Lumbar spondylosis with myelopathy 06/09/2013  . Tear film insufficiency, unspecified 06/05/2013  . Thoracic or lumbosacral neuritis or  radiculitis, unspecified 06/05/2013  . Inflammation of shoulder joint 10/09/2011  . History of repair of left rotator cuff 10/09/2011  . Need for diphtheria-tetanus-pertussis (Tdap) vaccine, adult/adolescent 04/13/2011  . Chest pain   . Dizziness   . Physical exam, annual 03/26/2011  . Osteoarthritis 06/21/2010  . Pseudogout involving multiple joints 06/20/2010  . HIP PAIN 06/20/2010  . HYPERCHOLESTEROLEMIA, BORDERLINE 07/19/2008  . TINNITUS, CHRONIC 07/19/2008  . RHEUMATIC FEVER 07/19/2008  . Allergic rhinitis 07/19/2008  . GERD 07/19/2008  . SPONDYLOSIS 07/19/2008  . COLONIC POLYPS, HX OF 07/19/2008  . Melanoma of skin ([place]) 01/11/2008  . Essential hypertension 01/11/2008    Sigurd Sos, PT 05/01/19 11:32 AM  [facility] [address], [place], [place], [address] Phone: [phone]   Fax:  [phone]  Name: JEMUEL LAURSEN, MD MRN: [id number] Date of Birth: 22-Mar-1946

## 2019-05-04 ENCOUNTER — Other Ambulatory Visit: Payer: Self-pay

## 2019-05-04 ENCOUNTER — Ambulatory Visit: Payer: Medicare Other

## 2019-05-04 DIAGNOSIS — M6281 Muscle weakness (generalized): Secondary | ICD-10-CM | POA: Diagnosis not present

## 2019-05-04 DIAGNOSIS — R293 Abnormal posture: Secondary | ICD-10-CM

## 2019-05-04 NOTE — Therapy (Signed)
[facility] [address], [place], [place], [address] Phone: [phone]   Fax:  [phone]  Physical Therapy Treatment  Patient Details  Name: JODECI ROARTY, MD MRN: [id number] Date of Birth: Oct 04, 1946 Referring Provider (PT): Jovita Gamma, MD   Encounter Date: 05/04/2019  PT End of Session - 05/04/19 1424    Visit Number  8    PT Start Time  1331    PT Stop Time  3557    PT Time Calculation (min)  51 min    Activity Tolerance  Patient tolerated treatment well    Behavior During Therapy  [facility] for tasks assessed/performed       Past Medical History:  Diagnosis Date  . Allergic rhinitis   . Anxiety   . Cancer [facility])    Melanoma -peri umbilical '02 or '03- no further problems. 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   . Chest pain    Nuclear 2005, normal / coronary CTA 2008, slight mixed plaque, coronary calcium score 0.65, ejection fraction 55%, echo, March, 2008  . Complication of anesthesia    itching-not sure if oxycodone or anesthesia,and shakes. Right TMJ issue  . Dizziness    Dizziness with question of presyncope April 08, 2011  . DJD (degenerative joint disease)    history DDD, fingers, knees, shoulders  . Ejection fraction    EF 55%, echo, March, 2008.  . Family history of Alzheimer's disease   . GERD (gastroesophageal reflux disease)   . H/O hiatal hernia   . Hearing impaired    bilateral hearing aids  . Heart murmur    73 yrs old rheumatic fever   . Heart murmur 2008   trivial tricusp regurg  . History of colonic polyps   . History of melanoma   . History of pseudogout   . History of recent steroid use    10-10-15 tapering steroid use for tx. recent bronchitis.  Marland Kitchen History of rheumatic fever   . HNP (herniated nucleus pulposus), cervical   . Hypercholesteremia    borderline  . Hypertension   . Lumbar spondylosis with myelopathy 06/09/2013  . Spondylosis   . Tinnitus    chronic  . TMJ  click    right"was aggravated with last intubation"    Past Surgical History:  Procedure Laterality Date  . ANTERIOR CERVICAL DECOMP/DISCECTOMY FUSION  2003  . ANTERIOR CERVICAL DECOMP/DISCECTOMY FUSION N/A 05/18/2018   Procedure: ANTERIOR CERVICAL DECOMPRESSION/DISCECTOMY FUSION - CERVICAL FIVE-CERVICAL SIX, removal of tether plate;  Surgeon: Jovita Gamma, MD;  Location: [place];  Service: Neurosurgery;  Laterality: N/A;  . BACK SURGERY     x3  . CARPAL TUNNEL RELEASE Right 10/31/2013   Procedure: RIGHT CARPAL TUNNEL RELEASE;  Surgeon: Cammie Sickle., MD;  Location: [place];  Service: Orthopedics;  Laterality: Right;  . CARPAL TUNNEL RELEASE Left 11/28/2013   Procedure: LEFT CARPAL TUNNEL RELEASE;  Surgeon: Cammie Sickle., MD;  Location: [place];  Service: Orthopedics;  Laterality: Left;  . CERVICAL DISCECTOMY     anterior with patellar allograft and plating  . JOINT REPLACEMENT    . LUMBAR FUSION  12/14  . MELANOMA EXCISION WITH SENTINEL LYMPH NODE BIOPSY  8/02   bilat ing node bx  . ROTATOR CUFF REPAIR Left    08/13/11  08/27/2011  . TOTAL KNEE ARTHROPLASTY Right 10/07/2015   Procedure: RIGHT TOTAL KNEE ARTHROPLASTY, with synovial tissue specimen;  Surgeon: [name],  MD;  [facility];  Service: Orthopedics;  Laterality: Right;    There were no vitals filed for this visit.  Subjective Assessment - 05/04/19 1331    Subjective  I had some knee discomfort after last session.  I've gone the pool and walked the perimeter.  I am ready to D/C to HEP.    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  No/denies                       [facility] Adult PT Treatment/Exercise - 05/04/19 0001      Lumbar Exercises: Stretches   Active Hamstring Stretch  Right;Left;3 reps;20 seconds    Active  Hamstring Stretch Limitations  supine with strap   with bias adduction and abduction   Hip Flexor Stretch  Right;Left;5 reps;30 seconds    Hip Flexor Stretch Limitations  supine off side of table with contract relax therapist resist, orange roller over quads      Lumbar Exercises: Aerobic   Nustep  seat 9 L3 10 min       Lumbar Exercises: Seated   Sit to Stand  20 reps    Other Seated Lumbar Exercises  seated on green ball: 1# weights 3 way raises with core engagement 2x10 each      Knee/Hip Exercises: Standing   Other Standing Knee Exercises  golfer's lift with 5# weight x 20 each               PT Short Term Goals - 04/25/19 1623      PT SHORT TERM GOAL #1   Title  be independent in initial HEP    Status  Achieved      PT SHORT TERM GOAL #2   Title  demonstrate erect standing posture and report abdominal bracing with standing and walking    Status  Achieved        PT Long Term Goals - 05/04/19 1336      PT LONG TERM GOAL #1   Title  be independent in advanced HEP    Status  Achieved      PT LONG TERM GOAL #2   Title  reduce FOTO to < or = to 35% limitation    Baseline  31% limitation    Status  Achieved      PT LONG TERM GOAL #3   Title  verbalize and demonstrate understanding of progression of postural strength exercises    Status  Achieved      PT LONG TERM GOAL #4   Title  report 50% less lumbar fatigue with standing and walking    Baseline  20% limitation    Status  Partially Met      PT LONG TERM GOAL #5   Title  verbalize and demostrate correct body mehcanics for safety with daily activities    Status  Achieved            Plan - 05/04/19 1424    Clinical Impression Statement  Pt reports 20% less fatigue in his low back since the start of care.  Pt is ready to D/C to HEP for continued strength and flexibility progression.  Pt with improved postural awareness and endurance and is making postural corrections and body mechanics modifications  for home tasks.  Pt is limited by Lt knee pain regarding  lifting and squatting and will have TKA 06/16/19.  Pt will D/C today to HEP.    PT Next Visit Plan  D/C PT    PT Home Exercise Plan  Access Code: [email or web address]    Consulted and Agree with Plan of Care  Patient       Patient will benefit from skilled therapeutic intervention in order to improve the following deficits and impairments:     Visit Diagnosis: 1. Abnormal posture   2. Muscle weakness (generalized)        Problem List Patient Active Problem List   Diagnosis Date Noted  . Unilateral primary osteoarthritis, left knee 04/20/2019  . HNP (herniated nucleus pulposus), cervical 05/18/2018  . Arthrodesis status 05/08/2016  . Polyneuropathy 05/08/2016  . Other intervertebral disc degeneration, lumbar region 05/08/2016  . Spinal stenosis of lumbar region 05/08/2016  . Anesthesia of skin 05/08/2016  . OA (osteoarthritis) of knee 10/07/2015  . Encounter for immunization 09/03/2015  . Lesion of skeletal muscle 04/22/2015  . HLD (hyperlipidemia) 02/15/2014  . Lumbar stenosis with neurogenic claudication 10/02/2013  . Lumbosacral spinal stenosis 06/09/2013  . Lumbar spondylosis with myelopathy 06/09/2013  . Tear film insufficiency, unspecified 06/05/2013  . Thoracic or lumbosacral neuritis or radiculitis, unspecified 06/05/2013  . Inflammation of shoulder joint 10/09/2011  . History of repair of left rotator cuff 10/09/2011  . Need for diphtheria-tetanus-pertussis (Tdap) vaccine, adult/adolescent 04/13/2011  . Chest pain   . Dizziness   . Physical exam, annual 03/26/2011  . Osteoarthritis 06/21/2010  . Pseudogout involving multiple joints 06/20/2010  . HIP PAIN 06/20/2010  . HYPERCHOLESTEROLEMIA, BORDERLINE 07/19/2008  . TINNITUS, CHRONIC 07/19/2008  . RHEUMATIC FEVER 07/19/2008  . Allergic rhinitis 07/19/2008  . GERD 07/19/2008  . SPONDYLOSIS 07/19/2008  . COLONIC POLYPS, HX OF 07/19/2008  . Melanoma of skin ([place])  01/11/2008  . Essential hypertension 01/11/2008   PHYSICAL THERAPY DISCHARGE SUMMARY  Visits from Start of Care: 8  Current functional level related to goals / functional outcomes: Pt will D/C to HEP for flexibility, postural re-education and core/LE strength.  Pt is independent and compliant with this and will D/C to HEP.     Remaining deficits: Lumbar fatigue with standing tasks.     Education / Equipment: HEP, posture/body mechanics Plan: Patient agrees to discharge.  Patient goals were met. Patient is being discharged due to meeting the stated rehab goals.  ?????        Sigurd Sos, PT 05/04/19 2:26 PM  [facility] [address], [place], [place], [address] Phone: [phone]   Fax:  [phone]  Name: APRIL CARLYON, MD MRN: [id number] Date of Birth: [birth year]

## 2019-05-08 ENCOUNTER — Telehealth: Payer: Self-pay | Admitting: Orthopaedic Surgery

## 2019-05-08 DIAGNOSIS — L111 Transient acantholytic dermatosis [Grover]: Secondary | ICD-10-CM | POA: Diagnosis not present

## 2019-05-08 DIAGNOSIS — L814 Other melanin hyperpigmentation: Secondary | ICD-10-CM | POA: Diagnosis not present

## 2019-05-08 DIAGNOSIS — Z85820 Personal history of malignant melanoma of skin: Secondary | ICD-10-CM | POA: Diagnosis not present

## 2019-05-08 DIAGNOSIS — Z85828 Personal history of other malignant neoplasm of skin: Secondary | ICD-10-CM | POA: Diagnosis not present

## 2019-05-08 DIAGNOSIS — L821 Other seborrheic keratosis: Secondary | ICD-10-CM | POA: Diagnosis not present

## 2019-05-08 DIAGNOSIS — L570 Actinic keratosis: Secondary | ICD-10-CM | POA: Diagnosis not present

## 2019-05-08 NOTE — Telephone Encounter (Signed)
See below

## 2019-05-08 NOTE — Telephone Encounter (Signed)
Patient lmom requesting a call back with concerns for after surgery (physical therapy) and concerns on how his wife will get information during/after surgery since she is not allowed to go with him.

## 2019-05-18 DIAGNOSIS — M65849 Other synovitis and tenosynovitis, unspecified hand: Secondary | ICD-10-CM | POA: Diagnosis not present

## 2019-05-18 DIAGNOSIS — M65341 Trigger finger, right ring finger: Secondary | ICD-10-CM | POA: Diagnosis not present

## 2019-05-18 DIAGNOSIS — M79641 Pain in right hand: Secondary | ICD-10-CM | POA: Diagnosis not present

## 2019-05-18 DIAGNOSIS — M65311 Trigger thumb, right thumb: Secondary | ICD-10-CM | POA: Diagnosis not present

## 2019-06-01 ENCOUNTER — Other Ambulatory Visit: Payer: Self-pay | Admitting: Physician Assistant

## 2019-06-08 ENCOUNTER — Other Ambulatory Visit: Payer: Self-pay | Admitting: Pulmonary Disease

## 2019-06-12 NOTE — Progress Notes (Signed)
[facility] TAMMY PARRETT NP 11-01-18 EPIC

## 2019-06-12 NOTE — Patient Instructions (Addendum)
YOU NEED TO HAVE A COVID 19 TEST ON_8-18-20______ @_______ , THIS TEST MUST BE DONE BEFORE SURGERY, COME  Peter [name] , [id number]. ONCE YOUR COVID TEST IS COMPLETED, PLEASE BEGIN THE QUARANTINE INSTRUCTIONS AS OUTLINED IN YOUR HANDOUT.                Peter Bachelor, MD     Your procedure is scheduled on: 06-16-2019   Report to [facility] Main  Entrance              Report to  [place] at 530  AM   1 VISITOR IS ALLOWED TO WAIT IN WAITING ROOM  ONLY DAY OF YOUR SURGERY. NO VISITORS ARE ALLOWED IN SHORT STAY OR RECOVERY ROOM.   Call this number if you have problems the morning of surgery [phone]    Remember: [place], NO CHEWING GUM CANDY OR MINTS.   NO SOLID FOOD AFTER MIDNIGHT THE NIGHT PRIOR TO SURGERY. NOTHING BY MOUTH EXCEPT CLEAR LIQUIDS UNTIL  415 AM . PLEASE FINISH ENSURE DRINK PER SURGEON ORDER  WHICH NEEDS TO BE COMPLETED AT  415 AM .   CLEAR LIQUID DIET   Foods Allowed                                                                     Foods Excluded  Coffee and tea, regular and decaf                             liquids that you cannot  Plain Jell-O any favor except red or purple                                           see through such as: Fruit ices (not with fruit pulp)                                     milk, soups, orange juice  Iced Popsicles                                    All solid food Carbonated beverages, regular and diet                                    Cranberry, grape and apple juices Sports drinks like Gatorade Lightly seasoned clear broth or consume(fat free) Sugar, honey syrup  Sample Menu Breakfast                                Lunch                                     Supper Cranberry  juice                    Beef broth                            Chicken broth Jell-O                                     Grape juice                           Apple  juice Coffee or tea                        Jell-O                                      Popsicle                                                Coffee or tea                        Coffee or tea  _____________________________________________________________________     Take these medicines the morning of surgery with A SIP OF WATER: eye drops as usual colchicine, amlodipine                                You may not have any metal on your body including hair pins and              piercings  Do not wear jewelry,  lotions, powders or perfumes, deodorant                         Men may shave face and neck.   Do not bring valuables to the hospital. [place].  Contacts, dentures or bridgework may not be worn into surgery.   ____________________________________________________________________          [facility] - Preparing for Surgery Before surgery, you can play an important role.  Because skin is not sterile, your skin needs to be as free of germs as possible.  You can reduce the number of germs on your skin by washing with CHG (chlorahexidine gluconate) soap before surgery.  CHG is an antiseptic cleaner which kills germs and bonds with the skin to continue killing germs even after washing. Please DO NOT use if you have an allergy to CHG or antibacterial soaps.  If your skin becomes reddened/irritated stop using the CHG and inform your nurse when you arrive at Short Stay. Do not shave (including legs and underarms) for at least 48 hours prior to the first CHG shower.  You may shave your face/neck. Please follow these instructions carefully:  1.  Shower with CHG Soap the night before surgery and the  morning of Surgery.  2.  If you choose to wash your hair, wash your  hair first as usual with your  normal  shampoo.  3.  After you shampoo, rinse your hair and body thoroughly to remove the  shampoo.                           4.  Use CHG as you  would any other liquid soap.  You can apply chg directly  to the skin and wash                       Gently with a scrungie or clean washcloth.  5.  Apply the CHG Soap to your body ONLY FROM THE NECK DOWN.   Do not use on face/ open                           Wound or open sores. Avoid contact with eyes, ears mouth and genitals (private parts).                       Wash face,  Genitals (private parts) with your normal soap.             6.  Wash thoroughly, paying special attention to the area where your surgery  will be performed.  7.  Thoroughly rinse your body with warm water from the neck down.  8.  DO NOT shower/wash with your normal soap after using and rinsing off  the CHG Soap.                9.  Pat yourself dry with a clean towel.            10.  Wear clean pajamas.            11.  Place clean sheets on your bed the night of your first shower and do not  sleep with pets. Day of Surgery : Do not apply any lotions/deodorants the morning of surgery.  Please wear clean clothes to the hospital/surgery center.  FAILURE TO FOLLOW THESE INSTRUCTIONS MAY RESULT IN THE CANCELLATION OF YOUR SURGERY PATIENT SIGNATURE_________________________________  NURSE SIGNATURE__________________________________  ________________________________________________________________________   Adam Phenix  An incentive spirometer is a tool that can help keep your lungs clear and active. This tool measures how well you are filling your lungs with each breath. Taking long deep breaths may help reverse or decrease the chance of developing breathing (pulmonary) problems (especially infection) following:  A long period of time when you are unable to move or be active. BEFORE THE PROCEDURE   If the spirometer includes an indicator to show your best effort, your nurse or respiratory therapist will set it to a desired goal.  If possible, sit up straight or lean slightly forward. Try not to slouch.  Hold  the incentive spirometer in an upright position. INSTRUCTIONS FOR USE  1. Sit on the edge of your bed if possible, or sit up as far as you can in bed or on a chair. 2. Hold the incentive spirometer in an upright position. 3. Breathe out normally. 4. Place the mouthpiece in your mouth and seal your lips tightly around it. 5. Breathe in slowly and as deeply as possible, raising the piston or the ball toward the top of the column. 6. Hold your breath for 3-5 seconds or for as long as possible. Allow the piston or ball to fall to the bottom of the column. 7.  Remove the mouthpiece from your mouth and breathe out normally. 8. Rest for a few seconds and repeat Steps 1 through 7 at least 10 times every 1-2 hours when you are awake. Take your time and take a few normal breaths between deep breaths. 9. The spirometer may include an indicator to show your best effort. Use the indicator as a goal to work toward during each repetition. 10. After each set of 10 deep breaths, practice coughing to be sure your lungs are clear. If you have an incision (the cut made at the time of surgery), support your incision when coughing by placing a pillow or rolled up towels firmly against it. Once you are able to get out of bed, walk around indoors and cough well. You may stop using the incentive spirometer when instructed by your caregiver.  RISKS AND COMPLICATIONS  Take your time so you do not get dizzy or light-headed.  If you are in pain, you may need to take or ask for pain medication before doing incentive spirometry. It is harder to take a deep breath if you are having pain. AFTER USE  Rest and breathe slowly and easily.  It can be helpful to keep track of a log of your progress. Your caregiver can provide you with a simple table to help with this. If you are using the spirometer at home, follow these instructions: [place] IF:   You are having difficultly using the spirometer.  You have trouble  using the spirometer as often as instructed.  Your pain medication is not giving enough relief while using the spirometer.  You develop fever of 100.5 F (38.1 C) or higher. SEEK IMMEDIATE MEDICAL CARE IF:   You cough up bloody sputum that had not been present before.  You develop fever of 102 F (38.9 C) or greater.  You develop worsening pain at or near the incision site. MAKE SURE YOU:   Understand these instructions.  Will watch your condition.  Will get help right away if you are not doing well or get worse. Document Released: 02/22/2007 Document Revised: 01/04/2012 Document Reviewed: 04/25/2007 [facility] Patient Information 2014 [place], [place].   ________________________________________________________________________

## 2019-06-13 ENCOUNTER — Encounter (HOSPITAL_COMMUNITY): Payer: Self-pay

## 2019-06-13 ENCOUNTER — Other Ambulatory Visit: Payer: Self-pay

## 2019-06-13 ENCOUNTER — Other Ambulatory Visit (HOSPITAL_COMMUNITY)
Admission: RE | Admit: 2019-06-13 | Discharge: 2019-06-13 | Disposition: A | Discharge status: Home / Self Care | Payer: Medicare Other | Source: Ambulatory Visit | Attending: Orthopaedic Surgery | Admitting: Orthopaedic Surgery

## 2019-06-13 ENCOUNTER — Encounter (HOSPITAL_COMMUNITY)
Admission: RE | Admit: 2019-06-13 | Discharge: 2019-06-13 | Disposition: A | Discharge status: Home / Self Care | Payer: Medicare Other | Source: Ambulatory Visit | Attending: Orthopaedic Surgery | Admitting: Orthopaedic Surgery

## 2019-06-13 DIAGNOSIS — Z20828 Contact with and (suspected) exposure to other viral communicable diseases: Secondary | ICD-10-CM | POA: Diagnosis not present

## 2019-06-13 DIAGNOSIS — Z01818 Encounter for other preprocedural examination: Secondary | ICD-10-CM | POA: Insufficient documentation

## 2019-06-13 LAB — BASIC METABOLIC PANEL
Anion gap: 7 (ref 5–15)
BUN: 21 mg/dL (ref 8–23)
CO2: 28 mmol/L (ref 22–32)
Calcium: 9.4 mg/dL (ref 8.9–10.3)
Chloride: 101 mmol/L (ref 98–111)
Creatinine, Ser: 0.99 mg/dL (ref 0.61–1.24)
GFR calc Af Amer: >60 mL/min (ref >60)
GFR calc non Af Amer: >60 mL/min (ref >60)
Glucose, Bld: 81 mg/dL (ref 70–99)
Potassium: 3.8 mmol/L (ref 3.5–5.1)
Sodium: 136 mmol/L (ref 135–145)

## 2019-06-13 LAB — CBC
HCT: 45.9 % (ref 39.0–52.0)
Hemoglobin: 14.9 g/dL (ref 13.0–17.0)
MCH: 29.7 pg (ref 26.0–34.0)
MCHC: 32.5 g/dL (ref 30.0–36.0)
MCV: 91.6 fL (ref 80.0–100.0)
Platelets: 284 10*3/uL (ref 150–400)
RBC: 5.01 MIL/uL (ref 4.22–5.81)
RDW: 12.5 % (ref 11.5–15.5)
WBC: 6.6 10*3/uL (ref 4.0–10.5)
nRBC: 0 % (ref 0.0–0.2)

## 2019-06-13 LAB — SURGICAL PCR SCREEN
MRSA, PCR: NEGATIVE
Staphylococcus aureus: NEGATIVE

## 2019-06-13 LAB — SARS CORONAVIRUS 2 (TAT 6-24 HRS): SARS Coronavirus 2: NEGATIVE

## 2019-06-15 ENCOUNTER — Telehealth: Payer: Self-pay | Admitting: *Deleted

## 2019-06-15 NOTE — Care Plan (Signed)
RNCM call to patient to discuss Ortho bundle and CM role. Patient verbalized that his wife will be available for assistance at home after discharge. Does have 3 steps to get into home and is slightly worried about this and how he will be able to navigate. Reviewed all questions and concerns related to pre-op and post-op care and instructions. Anticipate HHPT will be needed at discharge. Referral made to [facility]. Discussed Outpatient PT and that RNCM will be checking with him and his progress to determine when it is time to transition over to this. Patient verbalized he has all equipment needed post-discharge (FWW, 3 in 1). Will continue to follow during hospitalization and post-op for continued CM needs.

## 2019-06-15 NOTE — Telephone Encounter (Signed)
Ortho bundle pre-op call completed. 

## 2019-06-16 ENCOUNTER — Other Ambulatory Visit: Payer: Self-pay

## 2019-06-16 ENCOUNTER — Encounter (HOSPITAL_COMMUNITY): Payer: Self-pay | Admitting: *Deleted

## 2019-06-16 ENCOUNTER — Ambulatory Visit (HOSPITAL_COMMUNITY): Payer: Medicare Other | Admitting: Physician Assistant

## 2019-06-16 ENCOUNTER — Encounter (HOSPITAL_COMMUNITY)
Admission: RE | Disposition: A | Discharge status: Home Health | Payer: Self-pay | Source: Ambulatory Visit | Attending: Orthopaedic Surgery

## 2019-06-16 ENCOUNTER — Observation Stay (HOSPITAL_COMMUNITY): Payer: Medicare Other

## 2019-06-16 ENCOUNTER — Inpatient Hospital Stay (HOSPITAL_COMMUNITY)
Admission: RE | Admit: 2019-06-16 | Discharge: 2019-06-18 | DRG: 470 | Disposition: A | Discharge status: Home Health | Payer: Medicare Other | Source: Ambulatory Visit | Attending: Orthopaedic Surgery | Admitting: Orthopaedic Surgery

## 2019-06-16 ENCOUNTER — Ambulatory Visit (HOSPITAL_COMMUNITY): Payer: Medicare Other | Admitting: Certified Registered Nurse Anesthetist

## 2019-06-16 DIAGNOSIS — Z981 Arthrodesis status: Secondary | ICD-10-CM | POA: Diagnosis not present

## 2019-06-16 DIAGNOSIS — Z881 Allergy status to other antibiotic agents status: Secondary | ICD-10-CM

## 2019-06-16 DIAGNOSIS — Z96652 Presence of left artificial knee joint: Secondary | ICD-10-CM | POA: Diagnosis not present

## 2019-06-16 DIAGNOSIS — G8918 Other acute postprocedural pain: Secondary | ICD-10-CM | POA: Diagnosis not present

## 2019-06-16 DIAGNOSIS — Z85820 Personal history of malignant melanoma of skin: Secondary | ICD-10-CM

## 2019-06-16 DIAGNOSIS — Z974 Presence of external hearing-aid: Secondary | ICD-10-CM

## 2019-06-16 DIAGNOSIS — K219 Gastro-esophageal reflux disease without esophagitis: Secondary | ICD-10-CM | POA: Diagnosis present

## 2019-06-16 DIAGNOSIS — M1712 Unilateral primary osteoarthritis, left knee: Secondary | ICD-10-CM | POA: Diagnosis not present

## 2019-06-16 DIAGNOSIS — I10 Essential (primary) hypertension: Secondary | ICD-10-CM | POA: Diagnosis present

## 2019-06-16 DIAGNOSIS — M2669 Other specified disorders of temporomandibular joint: Secondary | ICD-10-CM | POA: Diagnosis present

## 2019-06-16 DIAGNOSIS — Z888 Allergy status to other drugs, medicaments and biological substances status: Secondary | ICD-10-CM

## 2019-06-16 DIAGNOSIS — H918X3 Other specified hearing loss, bilateral: Secondary | ICD-10-CM | POA: Diagnosis present

## 2019-06-16 DIAGNOSIS — Z471 Aftercare following joint replacement surgery: Secondary | ICD-10-CM | POA: Diagnosis not present

## 2019-06-16 DIAGNOSIS — Z820 Family history of epilepsy and other diseases of the nervous system: Secondary | ICD-10-CM

## 2019-06-16 DIAGNOSIS — Z96651 Presence of right artificial knee joint: Secondary | ICD-10-CM | POA: Diagnosis present

## 2019-06-16 DIAGNOSIS — E785 Hyperlipidemia, unspecified: Secondary | ICD-10-CM | POA: Diagnosis not present

## 2019-06-16 DIAGNOSIS — Z885 Allergy status to narcotic agent status: Secondary | ICD-10-CM

## 2019-06-16 DIAGNOSIS — C439 Malignant melanoma of skin, unspecified: Secondary | ICD-10-CM | POA: Diagnosis not present

## 2019-06-16 DIAGNOSIS — Z91048 Other nonmedicinal substance allergy status: Secondary | ICD-10-CM

## 2019-06-16 DIAGNOSIS — E7800 Pure hypercholesterolemia, unspecified: Secondary | ICD-10-CM | POA: Diagnosis present

## 2019-06-16 HISTORY — PX: TOTAL KNEE ARTHROPLASTY: SHX125

## 2019-06-16 SURGERY — ARTHROPLASTY, KNEE, TOTAL
Anesthesia: Spinal | Site: Knee | Laterality: Left

## 2019-06-16 MED ORDER — TRANEXAMIC ACID-NACL 1000-0.7 MG/100ML-% IV SOLN
1000.0000 mg | INTRAVENOUS | Status: AC
  Administered 2019-06-16: 08:00:00 1000 mg via INTRAVENOUS
  Filled 2019-06-16: qty 100

## 2019-06-16 MED ORDER — 0.9 % SODIUM CHLORIDE (POUR BTL) OPTIME
TOPICAL | Status: DC | PRN
  Administered 2019-06-16: 1000 mL

## 2019-06-16 MED ORDER — OXYCODONE HCL 5 MG PO TABS: 5.0000 mg | ORAL_TABLET | ORAL | Status: DC | PRN

## 2019-06-16 MED ORDER — LISINOPRIL 10 MG PO TABS
10.0000 mg | ORAL_TABLET | Freq: Every day | ORAL | Status: DC
  Administered 2019-06-16 – 2019-06-18 (×3): 10 mg via ORAL
  Filled 2019-06-16 (×3): qty 1

## 2019-06-16 MED ORDER — RIVAROXABAN 10 MG PO TABS
10.0000 mg | ORAL_TABLET | Freq: Every day | ORAL | Status: DC
  Administered 2019-06-17 – 2019-06-18 (×2): 10 mg via ORAL
  Filled 2019-06-16 (×2): qty 1

## 2019-06-16 MED ORDER — PROPOFOL 10 MG/ML IV BOLUS
INTRAVENOUS | Status: AC
  Filled 2019-06-16: qty 60

## 2019-06-16 MED ORDER — METOCLOPRAMIDE HCL 5 MG PO TABS: 5.0000 mg | ORAL_TABLET | Freq: Three times a day (TID) | ORAL | Status: DC | PRN

## 2019-06-16 MED ORDER — POVIDONE-IODINE 10 % EX SWAB
2.0000 "application " | Freq: Once | CUTANEOUS | Status: AC
  Administered 2019-06-16: 2 via TOPICAL

## 2019-06-16 MED ORDER — BUPIVACAINE-EPINEPHRINE 0.25% -1:200000 IJ SOLN
INTRAMUSCULAR | Status: DC | PRN
  Administered 2019-06-16: 30 mL

## 2019-06-16 MED ORDER — OXYCODONE HCL 5 MG/5ML PO SOLN: 5.0000 mg | Freq: Once | ORAL | Status: DC | PRN

## 2019-06-16 MED ORDER — OXYCODONE HCL 5 MG PO TABS: 10.0000 mg | ORAL_TABLET | ORAL | Status: DC | PRN

## 2019-06-16 MED ORDER — BUPIVACAINE-EPINEPHRINE (PF) 0.25% -1:200000 IJ SOLN
INTRAMUSCULAR | Status: AC
  Filled 2019-06-16: qty 30

## 2019-06-16 MED ORDER — LIDOCAINE 2% (20 MG/ML) 5 ML SYRINGE
INTRAMUSCULAR | Status: AC
  Filled 2019-06-16: qty 5

## 2019-06-16 MED ORDER — CEFAZOLIN SODIUM-DEXTROSE 1-4 GM/50ML-% IV SOLN
1.0000 g | Freq: Four times a day (QID) | INTRAVENOUS | Status: AC
  Administered 2019-06-16 (×2): 1 g via INTRAVENOUS
  Filled 2019-06-16 (×3): qty 50

## 2019-06-16 MED ORDER — SODIUM CHLORIDE 0.9 % IV SOLN
INTRAVENOUS | Status: DC
  Administered 2019-06-16: 15:00:00 via INTRAVENOUS

## 2019-06-16 MED ORDER — SODIUM CHLORIDE 0.9 % IR SOLN
Status: DC | PRN
  Administered 2019-06-16: 1000 mL

## 2019-06-16 MED ORDER — LACTATED RINGERS IV SOLN
INTRAVENOUS | Status: DC
  Administered 2019-06-16 (×2): via INTRAVENOUS

## 2019-06-16 MED ORDER — TAMSULOSIN HCL 0.4 MG PO CAPS: 0.4000 mg | ORAL_CAPSULE | Freq: Every day | ORAL | Status: DC

## 2019-06-16 MED ORDER — PANTOPRAZOLE SODIUM 40 MG PO TBEC
40.0000 mg | DELAYED_RELEASE_TABLET | Freq: Every day | ORAL | Status: DC
  Administered 2019-06-16 – 2019-06-18 (×3): 40 mg via ORAL
  Filled 2019-06-16 (×3): qty 1

## 2019-06-16 MED ORDER — PHENOL 1.4 % MT LIQD
1.0000 | OROMUCOSAL | Status: DC | PRN
  Filled 2019-06-16: qty 177

## 2019-06-16 MED ORDER — DOCUSATE SODIUM 100 MG PO CAPS
100.0000 mg | ORAL_CAPSULE | Freq: Two times a day (BID) | ORAL | Status: DC
  Administered 2019-06-16 – 2019-06-18 (×4): 100 mg via ORAL
  Filled 2019-06-16 (×4): qty 1

## 2019-06-16 MED ORDER — HYDROMORPHONE HCL 1 MG/ML IJ SOLN
0.5000 mg | INTRAMUSCULAR | Status: DC | PRN
  Administered 2019-06-16 (×2): 1 mg via INTRAVENOUS
  Administered 2019-06-17: 15:00:00 0.5 mg via INTRAVENOUS
  Filled 2019-06-16 (×3): qty 1

## 2019-06-16 MED ORDER — DEXAMETHASONE SODIUM PHOSPHATE 10 MG/ML IJ SOLN
INTRAMUSCULAR | Status: AC
  Filled 2019-06-16: qty 1

## 2019-06-16 MED ORDER — ONDANSETRON HCL 4 MG/2ML IJ SOLN: 4.0000 mg | Freq: Four times a day (QID) | INTRAMUSCULAR | Status: DC | PRN

## 2019-06-16 MED ORDER — PHENYLEPHRINE 40 MCG/ML (10ML) SYRINGE FOR IV PUSH (FOR BLOOD PRESSURE SUPPORT)
PREFILLED_SYRINGE | INTRAVENOUS | Status: AC
  Filled 2019-06-16: qty 10

## 2019-06-16 MED ORDER — CHLORHEXIDINE GLUCONATE 4 % EX LIQD: 60.0000 mL | Freq: Once | CUTANEOUS | Status: DC

## 2019-06-16 MED ORDER — STERILE WATER FOR IRRIGATION IR SOLN
Status: DC | PRN
  Administered 2019-06-16: 2000 mL

## 2019-06-16 MED ORDER — ALFUZOSIN HCL ER 10 MG PO TB24
10.0000 mg | ORAL_TABLET | Freq: Every evening | ORAL | Status: DC
  Administered 2019-06-16 – 2019-06-17 (×2): 10 mg via ORAL
  Filled 2019-06-16 (×3): qty 1

## 2019-06-16 MED ORDER — METOCLOPRAMIDE HCL 5 MG/ML IJ SOLN: 5.0000 mg | Freq: Three times a day (TID) | INTRAMUSCULAR | Status: DC | PRN

## 2019-06-16 MED ORDER — ALPRAZOLAM 0.25 MG PO TABS: 0.2500 mg | ORAL_TABLET | Freq: Every evening | ORAL | Status: DC | PRN

## 2019-06-16 MED ORDER — COLCHICINE 0.6 MG PO TABS
0.6000 mg | ORAL_TABLET | Freq: Two times a day (BID) | ORAL | Status: DC
  Administered 2019-06-16 – 2019-06-18 (×5): 0.6 mg via ORAL
  Filled 2019-06-16 (×5): qty 1

## 2019-06-16 MED ORDER — ACETAMINOPHEN 325 MG PO TABS
325.0000 mg | ORAL_TABLET | Freq: Four times a day (QID) | ORAL | Status: DC | PRN
  Administered 2019-06-17: 13:00:00 650 mg via ORAL
  Filled 2019-06-16 (×3): qty 2

## 2019-06-16 MED ORDER — FENTANYL CITRATE (PF) 100 MCG/2ML IJ SOLN
INTRAMUSCULAR | Status: AC
  Filled 2019-06-16: qty 2

## 2019-06-16 MED ORDER — MIDAZOLAM HCL 2 MG/2ML IJ SOLN
INTRAMUSCULAR | Status: DC | PRN
  Administered 2019-06-16: 2 mg via INTRAVENOUS

## 2019-06-16 MED ORDER — ROPIVACAINE HCL 5 MG/ML IJ SOLN
INTRAMUSCULAR | Status: DC | PRN
  Administered 2019-06-16: 20 mL via PERINEURAL

## 2019-06-16 MED ORDER — CEFAZOLIN SODIUM-DEXTROSE 2-4 GM/100ML-% IV SOLN
2.0000 g | INTRAVENOUS | Status: AC
  Administered 2019-06-16: 2 g via INTRAVENOUS
  Filled 2019-06-16: qty 100

## 2019-06-16 MED ORDER — ONDANSETRON HCL 4 MG/2ML IJ SOLN
INTRAMUSCULAR | Status: AC
  Filled 2019-06-16: qty 2

## 2019-06-16 MED ORDER — BUPIVACAINE IN DEXTROSE 0.75-8.25 % IT SOLN
INTRATHECAL | Status: DC | PRN
  Administered 2019-06-16: 1.6 mL via INTRATHECAL

## 2019-06-16 MED ORDER — MENTHOL 3 MG MT LOZG: 1.0000 | LOZENGE | OROMUCOSAL | Status: DC | PRN

## 2019-06-16 MED ORDER — HYDROMORPHONE HCL 1 MG/ML IJ SOLN: 0.2500 mg | INTRAMUSCULAR | Status: DC | PRN

## 2019-06-16 MED ORDER — CYCLOSPORINE 0.05 % OP EMUL
1.0000 [drp] | Freq: Every day | OPHTHALMIC | Status: DC
  Administered 2019-06-17 – 2019-06-18 (×2): 1 [drp] via OPHTHALMIC
  Filled 2019-06-16 (×2): qty 30

## 2019-06-16 MED ORDER — PROPOFOL 500 MG/50ML IV EMUL
INTRAVENOUS | Status: DC | PRN
  Administered 2019-06-16: 100 ug/kg/min via INTRAVENOUS

## 2019-06-16 MED ORDER — FENTANYL CITRATE (PF) 100 MCG/2ML IJ SOLN
INTRAMUSCULAR | Status: DC | PRN
  Administered 2019-06-16 (×2): 50 ug via INTRAVENOUS

## 2019-06-16 MED ORDER — KETOROLAC TROMETHAMINE 15 MG/ML IJ SOLN
7.5000 mg | Freq: Four times a day (QID) | INTRAMUSCULAR | Status: AC
  Administered 2019-06-16 – 2019-06-17 (×4): 7.5 mg via INTRAVENOUS
  Filled 2019-06-16 (×4): qty 1

## 2019-06-16 MED ORDER — POTASSIUM CHLORIDE CRYS ER 10 MEQ PO TBCR
10.0000 meq | EXTENDED_RELEASE_TABLET | ORAL | Status: DC
  Administered 2019-06-17: 10 meq via ORAL
  Filled 2019-06-16: qty 1

## 2019-06-16 MED ORDER — ALUM & MAG HYDROXIDE-SIMETH 200-200-20 MG/5ML PO SUSP: 30.0000 mL | ORAL | Status: DC | PRN

## 2019-06-16 MED ORDER — PROPOFOL 10 MG/ML IV BOLUS
INTRAVENOUS | Status: DC | PRN
  Administered 2019-06-16: 10 mg via INTRAVENOUS
  Administered 2019-06-16 (×2): 20 mg via INTRAVENOUS

## 2019-06-16 MED ORDER — AMLODIPINE BESYLATE 5 MG PO TABS
5.0000 mg | ORAL_TABLET | Freq: Every day | ORAL | Status: DC
  Administered 2019-06-17 – 2019-06-18 (×2): 5 mg via ORAL
  Filled 2019-06-16 (×2): qty 1

## 2019-06-16 MED ORDER — MIDAZOLAM HCL 2 MG/2ML IJ SOLN
INTRAMUSCULAR | Status: AC
  Filled 2019-06-16: qty 2

## 2019-06-16 MED ORDER — OXYCODONE HCL 5 MG PO TABS: 5.0000 mg | ORAL_TABLET | Freq: Once | ORAL | Status: DC | PRN

## 2019-06-16 MED ORDER — DIPHENHYDRAMINE HCL 12.5 MG/5ML PO ELIX: 12.5000 mg | ORAL_SOLUTION | ORAL | Status: DC | PRN

## 2019-06-16 MED ORDER — HYDROMORPHONE HCL 2 MG PO TABS
2.0000 mg | ORAL_TABLET | ORAL | Status: DC | PRN
  Administered 2019-06-16: 11:00:00 0.5 mg via ORAL
  Administered 2019-06-16: 2 mg via ORAL
  Administered 2019-06-16: 16:00:00 1 mg via ORAL
  Administered 2019-06-17 – 2019-06-18 (×6): 2 mg via ORAL
  Filled 2019-06-16 (×9): qty 1

## 2019-06-16 MED ORDER — PROPOFOL 10 MG/ML IV BOLUS
INTRAVENOUS | Status: AC
  Filled 2019-06-16: qty 20

## 2019-06-16 MED ORDER — ONDANSETRON HCL 4 MG/2ML IJ SOLN
INTRAMUSCULAR | Status: DC | PRN
  Administered 2019-06-16: 4 mg via INTRAVENOUS

## 2019-06-16 MED ORDER — METHOCARBAMOL 500 MG IVPB - SIMPLE MED
500.0000 mg | Freq: Four times a day (QID) | INTRAVENOUS | Status: DC | PRN
  Filled 2019-06-16: qty 50

## 2019-06-16 MED ORDER — ONDANSETRON HCL 4 MG PO TABS: 4.0000 mg | ORAL_TABLET | Freq: Four times a day (QID) | ORAL | Status: DC | PRN

## 2019-06-16 MED ORDER — METHOCARBAMOL 500 MG PO TABS
500.0000 mg | ORAL_TABLET | Freq: Four times a day (QID) | ORAL | Status: DC | PRN
  Administered 2019-06-16 – 2019-06-18 (×4): 500 mg via ORAL
  Filled 2019-06-16 (×4): qty 1

## 2019-06-16 MED ORDER — PHENYLEPHRINE 40 MCG/ML (10ML) SYRINGE FOR IV PUSH (FOR BLOOD PRESSURE SUPPORT)
PREFILLED_SYRINGE | INTRAVENOUS | Status: DC | PRN
  Administered 2019-06-16: 40 ug via INTRAVENOUS
  Administered 2019-06-16 (×2): 80 ug via INTRAVENOUS
  Administered 2019-06-16: 40 ug via INTRAVENOUS
  Administered 2019-06-16 (×2): 80 ug via INTRAVENOUS

## 2019-06-16 MED ORDER — ROSUVASTATIN CALCIUM 10 MG PO TABS
10.0000 mg | ORAL_TABLET | Freq: Every day | ORAL | Status: DC
  Administered 2019-06-16 – 2019-06-17 (×2): 10 mg via ORAL
  Filled 2019-06-16 (×2): qty 1

## 2019-06-16 MED ORDER — TRIAMTERENE-HCTZ 37.5-25 MG PO TABS
1.0000 | ORAL_TABLET | Freq: Every day | ORAL | Status: DC
  Administered 2019-06-17 – 2019-06-18 (×2): 1 via ORAL
  Filled 2019-06-16 (×2): qty 1

## 2019-06-16 MED ORDER — PHENYLEPHRINE HCL (PRESSORS) 10 MG/ML IV SOLN
INTRAVENOUS | Status: AC
  Filled 2019-06-16: qty 1

## 2019-06-16 MED ORDER — SODIUM CHLORIDE 0.9 % IV SOLN
INTRAVENOUS | Status: DC | PRN
  Administered 2019-06-16: 40 ug/min via INTRAVENOUS

## 2019-06-16 SURGICAL SUPPLY — 57 items
APL SKNCLS STERI-STRIP NONHPOA (GAUZE/BANDAGES/DRESSINGS) ×1
BAG SPEC THK2 15X12 ZIP CLS (MISCELLANEOUS)
BAG ZIPLOCK 12X15 (MISCELLANEOUS) IMPLANT
BASEPLATE TIBIAL PRIMARY SZ5 (Plate) ×1 IMPLANT
BEARING TIBIAL KNEE INSRT SZ 5 (Insert) IMPLANT
BENZOIN TINCTURE PRP APPL 2/3 (GAUZE/BANDAGES/DRESSINGS) ×1 IMPLANT
BLADE SAG 18X100X1.27 (BLADE) ×1 IMPLANT
BLADE SURG SZ10 CARB STEEL (BLADE) ×4 IMPLANT
BNDG ELASTIC 6X5.8 VLCR STR LF (GAUZE/BANDAGES/DRESSINGS) ×2 IMPLANT
BOWL SMART MIX CTS (DISPOSABLE) ×1 IMPLANT
BSPLAT TIB 5 CMNT PRM STRL KN (Plate) ×1 IMPLANT
CEMENT BONE SIMPLEX SPEEDSET (Cement) ×2 IMPLANT
CLSR STERI-STRIP ANTIMIC 1/2X4 (GAUZE/BANDAGES/DRESSINGS) ×2 IMPLANT
COVER SURGICAL LIGHT HANDLE (MISCELLANEOUS) ×2 IMPLANT
COVER WAND RF STERILE (DRAPES) IMPLANT
CUFF TOURN SGL QUICK 34 (TOURNIQUET CUFF) ×2
CUFF TRNQT CYL 34X4.125X (TOURNIQUET CUFF) ×1 IMPLANT
DECANTER SPIKE VIAL GLASS SM (MISCELLANEOUS) IMPLANT
DRAPE U-SHAPE 47X51 STRL (DRAPES) ×2 IMPLANT
DRSG PAD ABDOMINAL 8X10 ST (GAUZE/BANDAGES/DRESSINGS) ×3 IMPLANT
DURAPREP 26ML APPLICATOR (WOUND CARE) ×2 IMPLANT
ELECT REM PT RETURN 15FT ADLT (MISCELLANEOUS) ×2 IMPLANT
FEMORAL PEG DISTAL FIXATION (Orthopedic Implant) ×1 IMPLANT
FEMORAL POST STABILIZED NO 5 (Orthopedic Implant) ×1 IMPLANT
GAUZE SPONGE 4X4 12PLY STRL (GAUZE/BANDAGES/DRESSINGS) ×2 IMPLANT
GAUZE XEROFORM 1X8 LF (GAUZE/BANDAGES/DRESSINGS) ×1 IMPLANT
GLOVE BIO SURGEON STRL SZ7.5 (GLOVE) ×2 IMPLANT
GLOVE BIOGEL PI IND STRL 8 (GLOVE) ×2 IMPLANT
GLOVE BIOGEL PI INDICATOR 8 (GLOVE) ×2
GLOVE ECLIPSE 8.0 STRL XLNG CF (GLOVE) ×2 IMPLANT
GOWN STRL REUS W/TWL XL LVL3 (GOWN DISPOSABLE) ×4 IMPLANT
HANDPIECE INTERPULSE COAX TIP (DISPOSABLE) ×2
HOLDER FOLEY CATH W/STRAP (MISCELLANEOUS) IMPLANT
IMMOBILIZER KNEE 20 (SOFTGOODS) ×2
IMMOBILIZER KNEE 20 THIGH 36 (SOFTGOODS) ×1 IMPLANT
KIT TURNOVER KIT A (KITS) IMPLANT
NS IRRIG 1000ML POUR BTL (IV SOLUTION) ×2 IMPLANT
PACK TOTAL KNEE CUSTOM (KITS) ×2 IMPLANT
PADDING CAST COTTON 6X4 STRL (CAST SUPPLIES) ×4 IMPLANT
PATELLA 32MMX10MM (Knees) ×1 IMPLANT
PIN FLUTED HEDLESS FIX 3.5X1/8 (PIN) ×1 IMPLANT
PROTECTOR NERVE ULNAR (MISCELLANEOUS) ×2 IMPLANT
SET HNDPC FAN SPRY TIP SCT (DISPOSABLE) ×1 IMPLANT
SET PAD KNEE POSITIONER (MISCELLANEOUS) ×2 IMPLANT
STAPLER VISISTAT 35W (STAPLE) IMPLANT
STRIP CLOSURE SKIN 1/2X4 (GAUZE/BANDAGES/DRESSINGS) IMPLANT
SUT MNCRL AB 4-0 PS2 18 (SUTURE) ×1 IMPLANT
SUT VIC AB 0 CT1 27 (SUTURE) ×2
SUT VIC AB 0 CT1 27XBRD ANTBC (SUTURE) ×1 IMPLANT
SUT VIC AB 1 CT1 36 (SUTURE) ×4 IMPLANT
SUT VIC AB 2-0 CT1 27 (SUTURE) ×4
SUT VIC AB 2-0 CT1 TAPERPNT 27 (SUTURE) ×2 IMPLANT
TIBIAL BEARIN KNEE INSERT SZ 5 (Insert) ×2 IMPLANT
TRAY FOLEY MTR SLVR 16FR STAT (SET/KITS/TRAYS/PACK) ×2 IMPLANT
WATER STERILE IRR 1000ML POUR (IV SOLUTION) ×3 IMPLANT
WRAP KNEE MAXI GEL POST OP (GAUZE/BANDAGES/DRESSINGS) ×2 IMPLANT
YANKAUER SUCT BULB TIP 10FT TU (MISCELLANEOUS) ×2 IMPLANT

## 2019-06-16 NOTE — Brief Op Note (Signed)
06/16/2019  8:58 AM  PATIENT:  Peter Bachelor, MD  73 y.o. male  PRE-OPERATIVE DIAGNOSIS:  Left Knee Osteoarthritis  POST-OPERATIVE DIAGNOSIS:  Left Knee Osteoarthritis  PROCEDURE:  Procedure(s): LEFT TOTAL KNEE ARTHROPLASTY (Left)  SURGEON:  Surgeon(s) and Role:    Mcarthur Rossetti, MD - Primary  PHYSICIAN ASSISTANT: Benita Stabile, PA-C  ANESTHESIA:   local, regional and spinal  COUNTS:  YES  TOURNIQUET:   Total Tourniquet Time Documented: Thigh (Left) - 57 minutes Total: Thigh (Left) - 57 minutes   DICTATION: .Other Dictation: Dictation Number [id number]  PLAN OF CARE: Admit to inpatient   PATIENT DISPOSITION:  PACU - hemodynamically stable.   Delay start of Pharmacological VTE agent (>24hrs) due to surgical blood loss or risk of bleeding: no

## 2019-06-16 NOTE — Anesthesia Procedure Notes (Signed)
Anesthesia Regional Block: Adductor canal block   Pre-Anesthetic Checklist: ,, timeout performed, Correct Patient, Correct Site, Correct Laterality, Correct Procedure, Correct Position, site marked, Risks and benefits discussed,  Surgical consent,  Pre-op evaluation,  At surgeon's request and post-op pain management  Laterality: Left  Prep: chloraprep       Needles:  Injection technique: Single-shot  Needle Type: Stimiplex     Needle Length: 9cm  Needle Gauge: 21     Additional Needles:   Procedures:,,,, ultrasound used (permanent image in chart),,,,  Narrative:  Start time: 06/16/2019 7:06 AM End time: 06/16/2019 7:11 AM Injection made incrementally with aspirations every 5 mL.  Performed by: Personally  Anesthesiologist: Lynda Rainwater, MD

## 2019-06-16 NOTE — Anesthesia Postprocedure Evaluation (Signed)
Anesthesia Post Note  Patient: Peter Bachelor, MD  Procedure(s) Performed: LEFT TOTAL KNEE ARTHROPLASTY (Left Knee)     Patient location during evaluation: PACU Anesthesia Type: Spinal Level of consciousness: oriented and awake and alert Pain management: pain level controlled Vital Signs Assessment: post-procedure vital signs reviewed and stable Respiratory status: spontaneous breathing and respiratory function stable Cardiovascular status: blood pressure returned to baseline and stable Postop Assessment: no headache, no backache and no apparent nausea or vomiting Anesthetic complications: no    Last Vitals:  Vitals:   06/16/19 0945 06/16/19 1000  BP: 109/65   Pulse: 66   Resp: 17 15  Temp:  36.6 C  SpO2: 100%     Last Pain:  Vitals:   06/16/19 1000  TempSrc:   PainSc: 0-No pain                 Lynda Rainwater

## 2019-06-16 NOTE — Care Plan (Signed)
Ortho Bundle Case Management Note  Patient Details  Name: Peter SALMINEN, MD MRN: [id number] Date of Birth: 1945/12/08  [facility] contacted patient on 06/15/19 for pre-op call. He has a wife who will assist at discharge home. He has all DME needs of a FWW and 3in1. HHPT is anticipated upon discharge and [facility] liaison has been notified. Choice provided. Patient is concerned regarding getting in his home due to 3 steps at entrance. Will continue to follow along for any other CM needs.                   DME Arranged:   No DME needed- Patient verbalized he has all DME (FWW, 3in1) already at home. DME Agency:     HH Arranged:  PT HH Agency:  [facility] (now [facility])  Additional Comments: Please contact me with any questions of if this plan should need to change.  Jamse Arn, RN, BSN, [employer]  [phone] 06/16/2019, 9:42 AM

## 2019-06-16 NOTE — Progress Notes (Signed)
Patient ID: [name], MD, male   DOB: 1946/07/17, 73 y.o.   MRN: [id number] I did admit the patient under observation.  He may end up staying more than 1 night based on his previous experience after a total knee arthroplasty done in 2016 by one of my colleagues in town.  Also, in the order set, there was nothing stating what he is approved for in terms of his admission status.  Obviously if this is wrong in terms of his admission status we can change this with a different order to what ever is appropriate for his insurance authorization.

## 2019-06-16 NOTE — Evaluation (Signed)
Physical Therapy Evaluation Patient Details Name: Peter BANSAL, MD MRN: [id number] DOB: 27-Jul-1946 Today's Date: 06/16/2019   History of Present Illness  Pt s/p L TKR and with hx of R TKR and multiple spinal surgeries  Clinical Impression  Pt s/p L TKR and presents with decreased L LE strength/ROM and post op pain limiting functional mobility.  Pt should progress to dc home with family assist.    Follow Up Recommendations Follow surgeon's recommendation for DC plan and follow-up therapies    Equipment Recommendations  None recommended by PT    Recommendations for Other Services       Precautions / Restrictions Precautions Precautions: Knee;Fall Restrictions Weight Bearing Restrictions: No Other Position/Activity Restrictions: WBAT      Mobility  Bed Mobility Overal bed mobility: Needs Assistance Bed Mobility: Supine to Sit     Supine to sit: Min guard     General bed mobility comments: cues for sequence and min guard for L LE  Transfers Overall transfer level: Needs assistance Equipment used: Rolling walker (2 wheeled) Transfers: Sit to/from Stand Sit to Stand: Min assist         General transfer comment: cues for LE management and use of UEs to self assist  Ambulation/Gait Ambulation/Gait assistance: Min assist Gait Distance (Feet): 60 Feet Assistive device: Rolling walker (2 wheeled) Gait Pattern/deviations: Step-to pattern;Decreased step length - left;Decreased step length - right;Shuffle;Trunk flexed Gait velocity: decr   General Gait Details: cues for sequence, posture and position from [employer]            Wheelchair Mobility    Modified Rankin (Stroke Patients Only)       Balance                                             Pertinent Vitals/Pain Pain Assessment: 0-10 Pain Score: 5  Pain Location: L knee Pain Descriptors / Indicators: Aching;Sore Pain Intervention(s): Limited activity within patient's  tolerance;Monitored during session;Premedicated before session;Ice applied    Home Living Family/patient expects to be discharged to:: Private residence Living Arrangements: Spouse/significant other Available Help at Discharge: Family Type of Home: House Home Access: Stairs to enter Entrance Stairs-Rails: Left Entrance Stairs-Number of Steps: 4+1 Home Layout: Two level;One level   Additional Comments: Has RW and crutches    Prior Function Level of Independence: Independent               Hand Dominance   Dominant Hand: Right    Extremity/Trunk Assessment   Upper Extremity Assessment Upper Extremity Assessment: Overall WFL for tasks assessed    Lower Extremity Assessment Lower Extremity Assessment: LLE deficits/detail    Cervical / Trunk Assessment Cervical / Trunk Assessment: Normal  Communication   Communication: No difficulties  Cognition Arousal/Alertness: Awake/alert Behavior During Therapy: WFL for tasks assessed/performed Overall Cognitive Status: Within Functional Limits for tasks assessed                                        General Comments      Exercises Total Joint Exercises Ankle Circles/Pumps: AROM;Both;15 reps;Supine   Assessment/Plan    PT Assessment Patient needs continued PT services  PT Problem List Decreased strength;Decreased range of motion;Decreased activity tolerance;Decreased balance;Decreased mobility;Decreased knowledge of use of DME;Pain  PT Treatment Interventions DME instruction;Gait training;Stair training;Functional mobility training;Therapeutic activities;Therapeutic exercise;Patient/family education    PT Goals (Current goals can be found in the Care Plan section)  Acute Rehab PT Goals Patient Stated Goal: Regain IND PT Goal Formulation: With patient Time For Goal Achievement: 06/23/19 Potential to Achieve Goals: Good    Frequency 7X/week   Barriers to discharge        Co-evaluation                AM-PAC PT "6 Clicks" Mobility  Outcome Measure Help needed turning from your back to your side while in a flat bed without using bedrails?: A Little Help needed moving from lying on your back to sitting on the side of a flat bed without using bedrails?: A Little Help needed moving to and from a bed to a chair (including a wheelchair)?: A Little Help needed standing up from a chair using your arms (e.g., wheelchair or bedside chair)?: A Little Help needed to walk in hospital room?: A Little Help needed climbing 3-5 steps with a railing? : A Lot 6 Click Score: 17    End of Session Equipment Utilized During Treatment: Gait belt Activity Tolerance: Patient tolerated treatment well Patient left: in chair;with call bell/phone within reach;with family/visitor present Nurse Communication: Mobility status PT Visit Diagnosis: Difficulty in walking, not elsewhere classified (R26.2)    Time: [id number] PT Time Calculation (min) (ACUTE ONLY): 23 min   Charges:   PT Evaluation $PT Eval Low Complexity: 1 Low PT Treatments $Gait Training: 8-22 mins        [place] Pager [phone] Office [phone]   Jasmon Mattice 06/16/2019, 6:10 PM

## 2019-06-16 NOTE — Anesthesia Preprocedure Evaluation (Signed)
Anesthesia Evaluation  Patient identified by MRN, date of birth, ID band Patient awake    Reviewed: NPO status , Patient's Chart, lab work & pertinent test results, reviewed documented beta blocker date and time   History of Anesthesia Complications (+) history of anesthetic complications  Airway Mallampati: I  TM Distance: >3 FB Neck ROM: Full    Dental  (+) Teeth Intact   Pulmonary neg pulmonary ROS,    breath sounds clear to auscultation       Cardiovascular hypertension, Pt. 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 Wall thickness was normal. Systolic function was vigorous. The estimated ejection fraction was in the range of 65% to 70%. Wall motion was normal; there were no regional wall motion abnormalities. Doppler parameters are consistent with abnormal left ventricular relaxation (grade 1 diastolic dysfunction). There was no evidence of elevated ventricular  filling pressure by Doppler parameters. - Aortic valve: Structurally normal valve. There was no significant regurgitation. - Aortic root: The aortic root was normal in size. - Ascending aorta: The ascending aorta was normal in size. - Mitral valve: Structurally normal valve. There was no regurgitation. - Left atrium: The atrium was normal in size. - Right atrium: The atrium was normal in size. - Atrial septum: No defect or patent foramen ovale was identified. - Tricuspid valve: Structurally normal valve. There was mild regurgitation. - Pulmonic valve: Structurally normal valve. - Pulmonary arteries: Systolic pressure was within the normal range. - Inferior vena cava: The vessel was normal in size. The respirophasic diameter changes were in the normal range (= 50%), consistent with normal central venous pressure.   Anesthesia Physical  Anesthesia Plan  ASA: II  Anesthesia Plan: Spinal   Post-op Pain Management:    Induction: Intravenous  PONV Risk Score and Plan: 1 and Ondansetron and Treatment may vary due to age or medical condition  Airway Management Planned: Simple Face Mask  Additional Equipment:   Intra-op Plan:   Post-operative Plan:   Informed Consent: I have reviewed the patients History and Physical, chart, labs and discussed the procedure including the risks, benefits and alternatives for the proposed anesthesia with the patient or authorized representative who has indicated his/her understanding and acceptance.     Dental advisory given  Plan Discussed with: CRNA  Anesthesia Plan Comments:  Anesthesia Quick Evaluation  

## 2019-06-16 NOTE — H&P (Signed)
TOTAL KNEE ADMISSION H&P  Patient is being admitted for left total knee arthroplasty.  Subjective:  Chief Complaint:left knee pain.  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  Onset of symptoms was gradual, starting 3 years ago with gradually worsening course since that time. The patient noted no past surgery on the left knee(s).  Patient currently rates pain in the left knee(s) at 10 out of 10 with activity. Patient has night pain, worsening of pain with activity and weight bearing, pain that interferes with activities of daily living, pain with passive range of motion, crepitus and joint swelling.  Patient has evidence of subchondral sclerosis, periarticular osteophytes and joint space narrowing by imaging studies. There is no active infection.  Patient Active Problem List   Diagnosis Date Noted  . Unilateral primary osteoarthritis, left knee 04/20/2019  . HNP (herniated nucleus pulposus), cervical 05/18/2018  . Arthrodesis status 05/08/2016  . Polyneuropathy 05/08/2016  . Other intervertebral disc degeneration, lumbar region 05/08/2016  . Spinal stenosis of lumbar region 05/08/2016  . Anesthesia of skin 05/08/2016  . OA (osteoarthritis) of knee 10/07/2015  . Encounter for immunization 09/03/2015  . Lesion of skeletal muscle 04/22/2015  . HLD (hyperlipidemia) 02/15/2014  . Lumbar stenosis with neurogenic claudication 10/02/2013  . Lumbosacral spinal stenosis 06/09/2013  . Lumbar spondylosis with myelopathy 06/09/2013  . Tear film insufficiency, unspecified 06/05/2013  . Thoracic or lumbosacral neuritis or radiculitis, unspecified 06/05/2013  . Inflammation of shoulder  joint 10/09/2011  . History of repair of left rotator cuff 10/09/2011  . Need for diphtheria-tetanus-pertussis (Tdap) vaccine, adult/adolescent 04/13/2011  . Chest pain   . Dizziness   . Physical exam, annual 03/26/2011  . Osteoarthritis 06/21/2010  . Pseudogout involving multiple joints 06/20/2010  . HIP PAIN 06/20/2010  . HYPERCHOLESTEROLEMIA, BORDERLINE 07/19/2008  . TINNITUS, CHRONIC 07/19/2008  . RHEUMATIC FEVER 07/19/2008  . Allergic rhinitis 07/19/2008  . GERD 07/19/2008  . SPONDYLOSIS 07/19/2008  . COLONIC POLYPS, HX OF 07/19/2008  . Melanoma of skin ([place]) 01/11/2008  . Essential hypertension 01/11/2008   Past Medical History:  Diagnosis Date  . Allergic rhinitis   . Anxiety   . Cancer [facility])    Melanoma -peri umbilical '02 or '03- no further problems. 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   . Chest pain    Nuclear 2005, normal / coronary CTA 2008, slight mixed plaque, coronary calcium score 0.65, ejection fraction 55%, echo, March, 2008  . Complication of anesthesia    itching-not sure if oxycodone or anesthesia,and shakes. Right TMJ issue  chills in past  . Dizziness    Dizziness with question of presyncope April 08, 2011  . DJD (degenerative joint disease)    history DDD, fingers, knees, shoulders  . Ejection fraction    EF 55%, echo, March, 2008.  . Family history of Alzheimer's disease   . GERD (gastroesophageal reflux disease)   . Hearing impaired    bilateral hearing aids  . Heart murmur    73 yrs old rheumatic fever   . Heart murmur 2008   trivial tricusp regurg  . History of colonic polyps   . History of melanoma   . History of pseudogout   . History of recent steroid use    10-10-15 tapering  steroid use for tx. recent bronchitis.  Marland Kitchen History of rheumatic fever   . HNP (herniated nucleus pulposus), cervical   . Hypercholesteremia    borderline  . Hypertension   . Lumbar spondylosis with myelopathy 06/09/2013  . Spondylosis   . Tinnitus     chronic  . TMJ click    right"was aggravated with last intubation"    Past Surgical History:  Procedure Laterality Date  . ANTERIOR CERVICAL DECOMP/DISCECTOMY FUSION  2003  . ANTERIOR CERVICAL DECOMP/DISCECTOMY FUSION N/A 05/18/2018   Procedure: ANTERIOR CERVICAL DECOMPRESSION/DISCECTOMY FUSION - CERVICAL FIVE-CERVICAL SIX, removal of tether plate;  Surgeon: Jovita Gamma, MD;  Location: [place];  Service: Neurosurgery;  Laterality: N/A;  . BACK SURGERY     x3  . CARPAL TUNNEL RELEASE Right 10/31/2013   Procedure: RIGHT CARPAL TUNNEL RELEASE;  Surgeon: Cammie Sickle., MD;  Location: [place];  Service: Orthopedics;  Laterality: Right;  . CARPAL TUNNEL RELEASE Left 11/28/2013   Procedure: LEFT CARPAL TUNNEL RELEASE;  Surgeon: Cammie Sickle., MD;  Location: [place];  Service: Orthopedics;  Laterality: Left;  . CERVICAL DISCECTOMY     anterior with patellar allograft and plating  . JOINT REPLACEMENT     right knee 3 years ago  . LUMBAR FUSION  12/14  . MELANOMA EXCISION WITH SENTINEL LYMPH NODE BIOPSY  8/02   bilat ing node bx  . ROTATOR CUFF REPAIR Left    08/13/11  08/27/2011  . TOTAL KNEE ARTHROPLASTY Right 10/07/2015   Procedure: RIGHT TOTAL KNEE ARTHROPLASTY, with synovial tissue specimen;  Surgeon: [name], MD;  Location: [facility];  Service: Orthopedics;  Laterality: Right;    Current Facility-Administered Medications  Medication Dose Route Frequency Provider Last Rate Last Dose  . ceFAZolin (ANCEF) IVPB 2g/100 mL premix  2 g Intravenous On Call to OR Pete Pelt, PA-C      . chlorhexidine (HIBICLENS) 4 % liquid 4 application  60 mL Topical Once Pete Pelt, PA-C      . lactated ringers infusion   Intravenous Continuous Montez Hageman, MD 20 mL/hr at 06/16/19 0557    . tranexamic acid (CYKLOKAPRON) IVPB 1,000 mg  1,000 mg Intravenous To OR Pete Pelt, PA-C       Allergies  Allergen Reactions  . Gentamycin  [Gentamicin] Hives and Itching  . Turmeric Other (See Comments)    Cardiac arrythmias  . Bacitracin Rash  . Hydrocodone Rash  . Moxifloxacin Rash    pt had severe red rash- he thought from Avelox Rx....  . Neosporin [Neomycin-Bacitracin Zn-Polymyx] Rash  . Other Other (See Comments)    Redness  bandaide  . Promethazine Other (See Comments)    Restless legs  . Tape Dermatitis    bandaid-skin reaction  . Triamcinolone Acetonide Itching and Rash          Social History   Tobacco Use  . Smoking status: Never Smoker  . Smokeless tobacco: Never Used  Substance Use Topics  . Alcohol use: Yes    Alcohol/week: 2.0 - 4.0 standard drinks    Types: 2 - 4 Glasses of wine per week    Comment: 3 x a week    Family History  Problem Relation Age of Onset  . Alzheimer's disease Father   . Alzheimer's disease Mother      Review of Systems  Musculoskeletal: Positive for back pain and joint pain.  All other systems reviewed and are negative.  Objective:  Physical Exam  Constitutional: He is oriented to person, place, and time. He appears well-developed and well-nourished.  HENT:  Head: Normocephalic and atraumatic.  Eyes: Pupils are equal, round, and reactive to light. EOM are normal.  Neck: Normal range of motion. Neck supple.  Cardiovascular: Normal rate.  Respiratory: Effort normal.  GI: Soft.  Musculoskeletal:     Left knee: He exhibits decreased range of motion, effusion, abnormal alignment, bony tenderness and abnormal meniscus. Tenderness found. Medial joint line and lateral joint line tenderness noted.  Neurological: He is alert and oriented to person, place, and time.  Skin: Skin is warm and dry.  Psychiatric: He has a normal mood and affect.    Vital signs in last 24 hours: Temp:  [98.3 F (36.8 C)] 98.3 F (36.8 C) (08/21 0558) Pulse Rate:  [74] 74 (08/21 0558) Resp:  [17] 17 (08/21 0558) BP: (130)/(79) 130/79 (08/21 0558) SpO2:  [95 %] 95 % (08/21  0558) Weight:  [76.7 kg] 76.7 kg (08/21 0558)  Labs:   Estimated body mass index is 24.96 kg/m as calculated from the following:   Height as of this encounter: 5\' 9"  (1.753 m).   Weight as of this encounter: 76.7 kg.   Imaging Review Plain radiographs demonstrate severe degenerative joint disease of the left knee(s). The overall alignment ismild varus. The bone quality appears to be good for age and reported activity level.      Assessment/Plan:  End stage arthritis, left knee   The patient history, physical examination, clinical judgment of the provider and imaging studies are consistent with end stage degenerative joint disease of the left knee(s) and total knee arthroplasty is deemed medically necessary. The treatment options including medical management, injection therapy arthroscopy and arthroplasty were discussed at length. The risks and benefits of total knee arthroplasty were presented and reviewed. The risks due to aseptic loosening, infection, stiffness, patella tracking problems, thromboembolic complications and other imponderables were discussed. The patient acknowledged the explanation, agreed to proceed with the plan and consent was signed. Patient is being admitted for inpatient treatment for surgery, pain control, PT, OT, prophylactic antibiotics, VTE prophylaxis, progressive ambulation and ADL's and discharge planning. The patient is planning to be discharged home with home health services

## 2019-06-16 NOTE — Transfer of Care (Signed)
Immediate Anesthesia Transfer of Care Note  Patient: Peter Bachelor, MD  Procedure(s) Performed: LEFT TOTAL KNEE ARTHROPLASTY (Left Knee)  Patient Location: PACU  Anesthesia Type:Spinal and MAC combined with regional for post-op pain  Level of Consciousness: awake, alert  and oriented  Airway & Oxygen Therapy: Patient Spontanous Breathing and Patient connected to face mask oxygen  Post-op Assessment: Report given to RN and Post -op Vital signs reviewed and stable  Post vital signs: Reviewed and stable  Last Vitals:  Vitals Value Taken Time  BP 113/76   Temp    Pulse 73   Resp 13   SpO2 97%     Last Pain:  Vitals:   06/16/19 0558  TempSrc: Oral      Patients Stated Pain Goal: 4 (53/00/51 1021)  Complications: No apparent anesthesia complications

## 2019-06-16 NOTE — Anesthesia Procedure Notes (Signed)
Spinal  Patient location during procedure: OR Start time: 06/16/2019 7:20 AM End time: 06/16/2019 7:24 AM Staffing Anesthesiologist: Lynda Rainwater, MD Resident/CRNA: Genelle Bal, CRNA Performed: resident/CRNA  Preanesthetic Checklist Completed: patient identified, site marked, surgical consent, pre-op evaluation, timeout performed, IV checked, risks and benefits discussed and monitors and equipment checked Spinal Block Patient position: sitting Prep: DuraPrep Patient monitoring: heart rate, cardiac monitor, continuous pulse ox and blood pressure Approach: midline Location: L3-4 Injection technique: single-shot Needle Needle type: Sprotte  Needle gauge: 24 G Needle length: 9 cm Assessment Sensory level: T4 Additional Notes Patient sitting for spinal. Timeout performed. Spinal attempt x1, + clear, free-flowing CSF, easy to aspirate/inject, - paresthesia.

## 2019-06-16 NOTE — Op Note (Signed)
NAME: Peter [name], [facility] MEDICAL RECORD [id number] ACCOUNT [id number] DATE OF BIRTH:Apr 12, 1946 FACILITY: [facility] LOCATION: [id number] PHYSICIAN:Berry Godsey Kerry Fort, MD  OPERATIVE REPORT  DATE OF PROCEDURE:  06/16/2019  PREOPERATIVE DIAGNOSIS:  Primary osteoarthritis and degenerative joint disease, left knee.  POSTOPERATIVE DIAGNOSIS:  Primary osteoarthritis and degenerative joint disease, left knee.  PROCEDURE:  Left total knee arthroplasty.  IMPLANTS:  Stryker Triathlon cemented knee system with size 5 femur, size 5 tibial tray, 12 mm fixed-bearing polyethylene insert, size 32 cemented patellar button.  SURGEON:  Lind Guest. Ninfa Linden, MD  ASSISTANT:  Erskine Emery, PA-C  ANESTHESIA: 1.  Left lower extremity adductor canal block. 2.  Spinal. 3.  Local injection with 0.25% Marcaine with epinephrine.  TOURNIQUET TIME:  Under 1 hour.  ANTIBIOTICS:  Two grams IV Ancef.  ESTIMATED BLOOD LOSS:  Less than 100 mL.  COMPLICATIONS:  None.  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  He actually has a remote history of a right total knee arthroplasty that was  done in 2016 by one of my colleagues in town.  He is now presenting for a left total knee arthroplasty.  He has tried and failed multiple forms of conservative treatment for several years now.  He has gotten to the point where his left knee pain is  daily, and it is detrimentally affecting his mobility, his quality of life, and his activities of daily living.  He has had recurrent effusions as well as bouts of a pseudogout.  At this point, he has elected to proceed with a left total knee  arthroplasty.  We had a long and thorough discussion about this.  He understands fully the risk of acute blood loss anemia, nerve and vessel injury, fracture, infection, DVT, and implant failure.  He understands our  goals are to decrease pain, improve  mobility, and overall improve quality of life.  DESCRIPTION OF PROCEDURE:  After informed consent was obtained and appropriate left knee was marked, an adductor canal block was obtained in the holding room.  He was then brought to the operating room and sat up on the operating table.  Spinal  anesthesia was then obtained.  He was laid in supine position.  A Foley catheter was placed, and a nonsterile tourniquet was placed on his upper left thigh.  Left thigh, knee, leg, ankle, and foot were prepped and draped with DuraPrep and sterile drapes.   A time-out was called, and he was identified as correct patient and correct left knee.  We then used an Esmarch to wrap that leg, and tourniquet was inflated to 300 mm of pressure.  I then made a direct midline incision over the patella and carried  this proximally and distally.  We dissected down the knee joint and carried out a medial parapatellar arthrotomy, finding significant synovitis and an effusion within the left knee.  With the knee in a flexed position, we removed remnants of ACL, PCL,  and medial and lateral meniscus.  The medial and lateral meniscus showed obvious signs of hardening from his CPPD.  We removed periarticular osteophytes from the knee as well.  With the knee in a flexed position, we used extramedullary cutting guide for  making our proximal tibia cut.  We set the slope to be zero slope, correcting for varus and valgus and making our cut for taking 9 mm off the high side.  I felt like we needed to make 2 additional millimeter cuts off of this, which we did.   We then went  to the femur and used an intramedullary guide for a distal femoral cut.  We put our distal femoral cutting guide for a left knee at 5 degrees externally rotated and a 10 mm distal femoral cut.  We made this cut without difficulty and brought the knee  back down to full extension, and it was about between 9 and 11 extension block.  We  gave him full extension.  We then went back to the femur and put our femoral sizing guide based on the epicondylar axis and Whiteside line.  Based off of this for a left  knee at 3 degrees externally rotated, we chose a size 5 femur.  We put a 4-in-1 cutting block for a size 5 femur, made our anterior and posterior cuts followed by chamfer cuts.  We then made our femoral box cut.  Attention was turned back to the tibia.   We chose a size 5 tibial tray for coverage, setting the rotation off the tibial tubercle and the femur.  We made a keel punch off of this.  With the trial 5 femur and the trial 5 tibial tray, we tried a 9 and then an 11 mm fixed-bearing polyethylene  insert.  We felt like we would probably be best with a 12 mm insert getting his best stability and range of motion as possible.  We then made our patellar cut and drilled 3 holes for a size 32 patellar button.  We put the knee through range of motion  with all trial instrumentation in place, and I was pleased with this.  I removed all trial instrumentation and then irrigated the knee with normal saline solution using pulsatile lavage.  We placed a mixture of Marcaine and epinephrine around the joint  capsule and then mixed our cement.  We then cemented the real Stryker Triathlon tibial tray size 5, followed by the real size 5 left femur.  We placed our real 12 mm fixed-bearing polyethylene insert and cemented our patellar button.  Once the cement  hardened, we removed cement debris from the knee.  The tourniquet was let down.  Hemostasis was obtained with electrocautery.  We then closed our arthrotomy with interrupted #1 Vicryl suture, followed by 0 Vicryl to close the deep tissue, 2-0 Vicryl was  used to close the subcutaneous tissue, and a 4-0 Monocryl subcuticular stitch was used to close the skin.  Steri-Strips were applied as well as a well-padded sterile dressing.  He was taken to recovery room in stable condition.  All final counts were   correct.  No complications noted.  Note:  Benita Stabile, PA-C, assisted the entire case.  His assistance was crucial for facilitating all aspects of this case.  LN/NUANCE  D:06/16/2019 T:06/16/2019 JOB:[id number]

## 2019-06-17 ENCOUNTER — Other Ambulatory Visit: Payer: Self-pay

## 2019-06-17 DIAGNOSIS — H918X3 Other specified hearing loss, bilateral: Secondary | ICD-10-CM | POA: Diagnosis present

## 2019-06-17 DIAGNOSIS — Z96651 Presence of right artificial knee joint: Secondary | ICD-10-CM | POA: Diagnosis present

## 2019-06-17 DIAGNOSIS — M1712 Unilateral primary osteoarthritis, left knee: Secondary | ICD-10-CM | POA: Diagnosis present

## 2019-06-17 DIAGNOSIS — I10 Essential (primary) hypertension: Secondary | ICD-10-CM | POA: Diagnosis present

## 2019-06-17 DIAGNOSIS — Z974 Presence of external hearing-aid: Secondary | ICD-10-CM | POA: Diagnosis not present

## 2019-06-17 DIAGNOSIS — Z888 Allergy status to other drugs, medicaments and biological substances status: Secondary | ICD-10-CM | POA: Diagnosis not present

## 2019-06-17 DIAGNOSIS — Z85820 Personal history of malignant melanoma of skin: Secondary | ICD-10-CM | POA: Diagnosis not present

## 2019-06-17 DIAGNOSIS — K219 Gastro-esophageal reflux disease without esophagitis: Secondary | ICD-10-CM | POA: Diagnosis present

## 2019-06-17 DIAGNOSIS — M2669 Other specified disorders of temporomandibular joint: Secondary | ICD-10-CM | POA: Diagnosis present

## 2019-06-17 DIAGNOSIS — E7800 Pure hypercholesterolemia, unspecified: Secondary | ICD-10-CM | POA: Diagnosis present

## 2019-06-17 DIAGNOSIS — Z881 Allergy status to other antibiotic agents status: Secondary | ICD-10-CM | POA: Diagnosis not present

## 2019-06-17 DIAGNOSIS — Z91048 Other nonmedicinal substance allergy status: Secondary | ICD-10-CM | POA: Diagnosis not present

## 2019-06-17 DIAGNOSIS — E785 Hyperlipidemia, unspecified: Secondary | ICD-10-CM | POA: Diagnosis present

## 2019-06-17 DIAGNOSIS — Z981 Arthrodesis status: Secondary | ICD-10-CM | POA: Diagnosis not present

## 2019-06-17 DIAGNOSIS — Z820 Family history of epilepsy and other diseases of the nervous system: Secondary | ICD-10-CM | POA: Diagnosis not present

## 2019-06-17 DIAGNOSIS — Z885 Allergy status to narcotic agent status: Secondary | ICD-10-CM | POA: Diagnosis not present

## 2019-06-17 LAB — BASIC METABOLIC PANEL
Anion gap: 10 (ref 5–15)
BUN: 16 mg/dL (ref 8–23)
CO2: 23 mmol/L (ref 22–32)
Calcium: 8.5 mg/dL — ABNORMAL LOW (ref 8.9–10.3)
Chloride: 102 mmol/L (ref 98–111)
Creatinine, Ser: 1.03 mg/dL (ref 0.61–1.24)
GFR calc Af Amer: >60 mL/min (ref >60)
GFR calc non Af Amer: >60 mL/min (ref >60)
Glucose, Bld: 153 mg/dL — ABNORMAL HIGH (ref 70–99)
Potassium: 3.6 mmol/L (ref 3.5–5.1)
Sodium: 135 mmol/L (ref 135–145)

## 2019-06-17 LAB — CBC
HCT: 40.8 % (ref 39.0–52.0)
Hemoglobin: 12.9 g/dL — ABNORMAL LOW (ref 13.0–17.0)
MCH: 29.6 pg (ref 26.0–34.0)
MCHC: 31.6 g/dL (ref 30.0–36.0)
MCV: 93.6 fL (ref 80.0–100.0)
Platelets: 259 10*3/uL (ref 150–400)
RBC: 4.36 MIL/uL (ref 4.22–5.81)
RDW: 12.2 % (ref 11.5–15.5)
WBC: 13.6 10*3/uL — ABNORMAL HIGH (ref 4.0–10.5)
nRBC: 0 % (ref 0.0–0.2)

## 2019-06-17 MED ORDER — METHOCARBAMOL 500 MG PO TABS: 500.0000 mg | ORAL_TABLET | Freq: Four times a day (QID) | ORAL | 1 refills | Status: DC | PRN

## 2019-06-17 MED ORDER — HYDROMORPHONE HCL 2 MG PO TABS: 2.0000 mg | ORAL_TABLET | ORAL | 0 refills | Status: DC | PRN

## 2019-06-17 MED ORDER — RIVAROXABAN 10 MG PO TABS: 10.0000 mg | ORAL_TABLET | Freq: Every day | ORAL | 0 refills | Status: DC

## 2019-06-17 NOTE — TOC Progression Note (Signed)
Transition of Care [facility]) - Progression Note    Patient Details  Name: Peter STONEBREAKER, MD MRN: [id number] Date of Birth: January 13, 1946  [facility] CM/SW Contact  Joaquin Courts, RN Phone Number: 06/17/2019, 12:15 PM  Clinical Narrative: CM spoke with patient at bedside. Patient set up with [facility] home for [place]. Patient reports he has rolling walker and 3-in-1.       Expected Discharge Plan: [place] Barriers to Discharge: Continued Medical Work up  Expected Discharge Plan and Services Expected Discharge Plan: [place]   Discharge Planning Services: CM Consult Post Acute Care Choice: [place] arrangements for the past 2 months: Single Family Home                 DME Arranged: N/A DME Agency: NA       HH Arranged: PT HH Agency: [facility] [facility] [employer] Date [place]: 06/17/19 Time [place]: 1214 Representative spoke with at [place]: [place] ([place]) Interventions    Readmission Risk Interventions No flowsheet data found.

## 2019-06-17 NOTE — Progress Notes (Signed)
Subjective: 1 Day Post-Op Procedure(s) (LRB): LEFT TOTAL KNEE ARTHROPLASTY (Left) Patient reports pain as moderate.  Had a painful "rough" night according to the patient.  Objective: Vital signs in last 24 hours: Temp:  [97.6 F (36.4 C)-101.4 F (38.6 C)] 99.3 F (37.4 C) (08/22 0643) Pulse Rate:  [45-107] 107 (08/22 0643) Resp:  [15-18] 18 (08/22 0643) BP: (109-152)/(65-81) 152/81 (08/22 0643) SpO2:  [94 %-100 %] 94 % (08/22 0643)  Intake/Output from previous day: 08/21 0701 - 08/22 0700 In: 3570.6 [P.O.:720; I.V.:2799.7; IV Piggyback:50.9] Out: 3750 [Urine:3700; Blood:50] Intake/Output this shift: Total I/O In: 240 [P.O.:240] Out: -   Recent Labs    06/17/19 0248  HGB 12.9*   Recent Labs    06/17/19 0248  WBC 13.6*  RBC 4.36  HCT 40.8  PLT 259   Recent Labs    06/17/19 0248  NA 135  K 3.6  CL 102  CO2 23  BUN 16  CREATININE 1.03  GLUCOSE 153*  CALCIUM 8.5*   No results for input(s): LABPT, INR in the last 72 hours.  Sensation intact distally Intact pulses distally Dorsiflexion/Plantar flexion intact Incision: dressing C/D/I No cellulitis present Compartment soft   Assessment/Plan: 1 Day Post-Op Procedure(s) (LRB): LEFT TOTAL KNEE ARTHROPLASTY (Left) Up with therapy Plan for discharge tomorrow Discharge home with home health  Will stay today for maximizing therapy and pain control.    Mcarthur Rossetti 06/17/2019, 9:03 AM

## 2019-06-17 NOTE — Progress Notes (Signed)
Physical Therapy Treatment Patient Details Name: Peter BOUMAN, MD MRN: [id number] DOB: Nov 03, 1945 Today's Date: 06/17/2019    History of Present Illness Pt s/p L TKR and with hx of R TKR and multiple spinal surgeries    PT Comments    Pt continues motivated but ltd this pm by increased pain.   Follow Up Recommendations  Follow surgeon's recommendation for DC plan and follow-up therapies     Equipment Recommendations  None recommended by PT    Recommendations for Other Services       Precautions / Restrictions Precautions Precautions: Knee;Fall Restrictions Weight Bearing Restrictions: No Other Position/Activity Restrictions: WBAT    Mobility  Bed Mobility Overal bed mobility: Needs Assistance Bed Mobility: Sit to Supine       Sit to supine: Min assist   General bed mobility comments: cues for sequence, min assist L LE  Transfers Overall transfer level: Needs assistance Equipment used: Rolling walker (2 wheeled) Transfers: Sit to/from Stand Sit to Stand: Min assist;Min guard         General transfer comment: cues for LE management and use of UEs to self assist  Ambulation/Gait Ambulation/Gait assistance: Min guard Gait Distance (Feet): 75 Feet Assistive device: Rolling walker (2 wheeled) Gait Pattern/deviations: Step-to pattern;Decreased step length - left;Decreased step length - right;Shuffle;Trunk flexed Gait velocity: decr   General Gait Details: cues for sequence, posture and position from [employer]             Wheelchair Mobility    Modified Rankin (Stroke Patients Only)       Balance Overall balance assessment: Mild deficits observed, not formally tested                                          Cognition Arousal/Alertness: Awake/alert Behavior During Therapy: WFL for tasks assessed/performed Overall Cognitive Status: Within Functional Limits for tasks assessed                                         Exercises      General Comments        Pertinent Vitals/Pain Pain Assessment: 0-10 Pain Score: 7  Pain Location: L knee Pain Descriptors / Indicators: Aching;Sore Pain Intervention(s): Limited activity within patient's tolerance;Monitored during session;Premedicated before session;Ice applied;Patient requesting pain meds-RN notified    Home Living                      Prior Function            PT Goals (current goals can now be found in the care plan section) Acute Rehab PT Goals Patient Stated Goal: Regain IND PT Goal Formulation: With patient Time For Goal Achievement: 06/23/19 Potential to Achieve Goals: Good Progress towards PT goals: Not progressing toward goals - comment(pain limited this pm)    Frequency    7X/week      PT Plan Current plan remains appropriate    Co-evaluation              AM-PAC PT "6 Clicks" Mobility   Outcome Measure  Help needed turning from your back to your side while in a flat bed without using bedrails?: A Little Help needed moving from lying on your back to sitting on the side of  a flat bed without using bedrails?: A Little Help needed moving to and from a bed to a chair (including a wheelchair)?: A Little Help needed standing up from a chair using your arms (e.g., wheelchair or bedside chair)?: A Little Help needed to walk in hospital room?: A Little Help needed climbing 3-5 steps with a railing? : A Lot 6 Click Score: 17    End of Session Equipment Utilized During Treatment: Gait belt Activity Tolerance: Patient tolerated treatment well Patient left: in bed;with call bell/phone within reach;with family/visitor present Nurse Communication: Mobility status PT Visit Diagnosis: Difficulty in walking, not elsewhere classified (R26.2)     Time: [id number] PT Time Calculation (min) (ACUTE ONLY): 26 min  Charges:  $Gait Training: 8-22 mins                     [place] Pager [phone] Office [phone]    Peter [name] 06/17/2019, 4:32 PM

## 2019-06-17 NOTE — Discharge Instructions (Signed)
Information on my medicine - XARELTO (Rivaroxaban)  This medication education was reviewed with me or my healthcare representative as part of my discharge preparation.  The pharmacist that spoke with me during my hospital stay was:  Why was Xarelto prescribed for you? Xarelto was prescribed for you to reduce the risk of blood clots forming after orthopedic surgery. The medical term for these abnormal blood clots is venous thromboembolism (VTE).  What do you need to know about xarelto ? Take your Xarelto ONCE DAILY at the same time every day. You may take it either with or without food.  If you have difficulty swallowing the tablet whole, you may crush it and mix in applesauce just prior to taking your dose.  Take Xarelto exactly as prescribed by your doctor and DO NOT stop taking Xarelto without talking to the doctor who prescribed the medication.  Stopping without other VTE prevention medication to take the place of Xarelto may increase your risk of developing a clot.  After discharge, you should have regular check-up appointments with your healthcare provider that is prescribing your Xarelto.    What do you do if you miss a dose? If you miss a dose, take it as soon as you remember on the same day then continue your regularly scheduled once daily regimen the next day. Do not take two doses of Xarelto on the same day.   Important Safety Information A possible side effect of Xarelto is bleeding. You should call your healthcare provider right away if you experience any of the following: ? Bleeding from an injury or your nose that does not stop. ? Unusual colored urine (red or dark brown) or unusual colored stools (red or black). ? Unusual bruising for unknown reasons. ? A serious fall or if you hit your head (even if there is no bleeding).  Some medicines may interact with Xarelto and might increase your risk of bleeding while on Xarelto. To help avoid this, consult your  healthcare provider or pharmacist prior to using any new prescription or non-prescription medications, including herbals, vitamins, non-steroidal anti-inflammatory drugs (NSAIDs) and supplements.  This website has more information on Xarelto: [email or web address]  INSTRUCTIONS AFTER JOINT REPLACEMENT   o Remove items at home which could result in a fall. This includes throw rugs or furniture in walking pathways o ICE to the affected joint every three hours while awake for 30 minutes at a time, for at least the first 3-5 days, and then as needed for pain and swelling.  Continue to use ice for pain and swelling. You may notice swelling that will progress down to the foot and ankle.  This is normal after surgery.  Elevate your leg when you are not up walking on it.   o Continue to use the breathing machine you got in the hospital (incentive spirometer) which will help keep your temperature down.  It is common for your temperature to cycle up and down following surgery, especially at night when you are not up moving around and exerting yourself.  The breathing machine keeps your lungs expanded and your temperature down.   DIET:  As you were doing prior to hospitalization, we recommend a well-balanced diet.  DRESSING / WOUND CARE / SHOWERING  Keep the surgical dressing until follow up.  The dressing is water proof, so you can shower without any extra covering.  IF THE DRESSING FALLS OFF or the wound gets wet inside, change the dressing with sterile gauze.  Please use good hand washing  techniques before changing the dressing.  Do not use any lotions or creams on the incision until instructed by your surgeon.    ACTIVITY  o Increase activity slowly as tolerated, but follow the weight bearing instructions below.   o No driving for 6 weeks or until further direction given by your physician.  You cannot drive while taking narcotics.  o No lifting or carrying greater than 10 lbs. until further directed by  your surgeon. o Avoid periods of inactivity such as sitting longer than an hour when not asleep. This helps prevent blood clots.  o You may return to work once you are authorized by your doctor.     WEIGHT BEARING   Weight bearing as tolerated with assist device (walker, cane, etc) as directed, use it as long as suggested by your surgeon or therapist, typically at least 4-6 weeks.   EXERCISES  Results after joint replacement surgery are often greatly improved when you follow the exercise, range of motion and muscle strengthening exercises prescribed by your doctor. Safety measures are also important to protect the joint from further injury. Any time any of these exercises cause you to have increased pain or swelling, decrease what you are doing until you are comfortable again and then slowly increase them. If you have problems or questions, call your caregiver or physical therapist for advice.   Rehabilitation is important following a joint replacement. After just a few days of immobilization, the muscles of the leg can become weakened and shrink (atrophy).  These exercises are designed to build up the tone and strength of the thigh and leg muscles and to improve motion. Often times heat used for twenty to thirty minutes before working out will loosen up your tissues and help with improving the range of motion but do not use heat for the first two weeks following surgery (sometimes heat can increase post-operative swelling).   These exercises can be done on a training (exercise) mat, on the floor, on a table or on a bed. Use whatever works the best and is most comfortable for you.    Use music or television while you are exercising so that the exercises are a pleasant break in your day. This will make your life better with the exercises acting as a break in your routine that you can look forward to.   Perform all exercises about fifteen times, three times per day or as directed.  You should exercise  both the operative leg and the other leg as well.  Exercises include:    Quad Sets - Tighten up the muscle on the front of the thigh (Quad) and hold for 5-10 seconds.    Straight Leg Raises - With your knee straight (if you were given a brace, keep it on), lift the leg to 60 degrees, hold for 3 seconds, and slowly lower the leg.  Perform this exercise against resistance later as your leg gets stronger.   Leg Slides: Lying on your back, slowly slide your foot toward your buttocks, bending your knee up off the floor (only go as far as is comfortable). Then slowly slide your foot back down until your leg is flat on the floor again.   Angel Wings: Lying on your back spread your legs to the side as far apart as you can without causing discomfort.   Hamstring Strength:  Lying on your back, push your heel against the floor with your leg straight by tightening up the muscles of your buttocks.  Repeat, but this time bend your knee to a comfortable angle, and push your heel against the floor.  You may put a pillow under the heel to make it more comfortable if necessary.   A rehabilitation program following joint replacement surgery can speed recovery and prevent re-injury in the future due to weakened muscles. Contact your doctor or a physical therapist for more information on knee rehabilitation.    CONSTIPATION  Constipation is defined medically as fewer than three stools per week and severe constipation as less than one stool per week.  Even if you have a regular bowel pattern at home, your normal regimen is likely to be disrupted due to multiple reasons following surgery.  Combination of anesthesia, postoperative narcotics, change in appetite and fluid intake all can affect your bowels.   YOU MUST use at least one of the following options; they are listed in order of increasing strength to get the job done.  They are all available over the counter, and you may need to use some, POSSIBLY even all of  these options:    Drink plenty of fluids (prune juice may be helpful) and high fiber foods Colace 100 mg by mouth twice a day  Senokot for constipation as directed and as needed Dulcolax (bisacodyl), take with full glass of water  Miralax (polyethylene glycol) once or twice a day as needed.  If you have tried all these things and are unable to have a bowel movement in the first 3-4 days after surgery call either your surgeon or your primary doctor.    If you experience loose stools or diarrhea, hold the medications until you stool forms back up.  If your symptoms do not get better within 1 week or if they get worse, check with your doctor.  If you experience "the worst abdominal pain ever" or develop nausea or vomiting, please contact the office immediately for further recommendations for treatment.   ITCHING:  If you experience itching with your medications, try taking only a single pain pill, or even half a pain pill at a time.  You can also use Benadryl over the counter for itching or also to help with sleep.   TED HOSE STOCKINGS:  Use stockings on both legs until for at least 2 weeks or as directed by physician office. They may be removed at night for sleeping.  MEDICATIONS:  See your medication summary on the After Visit Summary that nursing will review with you.  You may have some home medications which will be placed on hold until you complete the course of blood thinner medication.  It is important for you to complete the blood thinner medication as prescribed.  PRECAUTIONS:  If you experience chest pain or shortness of breath - call 911 immediately for transfer to the hospital emergency department.   If you develop a fever greater that 101 F, purulent drainage from wound, increased redness or drainage from wound, foul odor from the wound/dressing, or calf pain - CONTACT YOUR SURGEON.                                                   FOLLOW-UP APPOINTMENTS:  If you do not already have  a post-op appointment, please call the office for an appointment to be seen by your surgeon.  Guidelines for how soon to be seen are  listed in your After Visit Summary, but are typically between 1-4 weeks after surgery.  OTHER INSTRUCTIONS:   Knee Replacement:  Do not place pillow under knee, focus on keeping the knee straight while resting. CPM instructions: 0-90 degrees, 2 hours in the morning, 2 hours in the afternoon, and 2 hours in the evening. Place foam block, curve side up under heel at all times except when in CPM or when walking.  DO NOT modify, tear, cut, or change the foam block in any way.  MAKE SURE YOU:   Understand these instructions.   Get help right away if you are not doing well or get worse.    Thank you for letting us be a part of your medical care team.  It is a privilege we respect greatly.  We hope these instructions will help you stay on track for a fast and full recovery!

## 2019-06-17 NOTE — Progress Notes (Signed)
    Home health agencies that serve [id number].        Home Health Agencies Search Results  Results List Table  Home Health Agency Information Quality of Patient Care Rating Patient Survey Summary Rating  [facility] [phone] 3 out of 5 stars 5 out of 5 stars  [facility] [phone] 4  out of 5 stars 3 out of 5 stars  [facility] [phone] 4 out of 5 stars 4 out of 5 stars  [facility] [phone] 4 out of 5 stars 4 out of 5 stars  [facility] [phone] 3 out of 5 stars 3 out of 5 stars  [facility] [phone] 3  out of 5 stars 4 out of 5 stars  [facility] [phone] 3 out of 5 stars 4 out of 5 stars  [facility] [phone] 4 out of 5 stars 5 out of 5 stars  [facility] [phone] 3  out of 5 stars 4 out of 5 stars  [facility] [phone] 2  out of 5 stars 4 out of 5 stars  [facility] [phone] 3 out of 5 stars 4 out of 5 stars  [facility] [phone] 4 out of 5 stars 4 out of 5 stars  [facility] [phone] 2  out of 5 stars 4 out of 5 stars  [facility] [phone] 4  out of 5 stars 3 out of 5 stars  [facility] [phone] 4  out of 5 stars 2 out of 5 stars   Home Health Footnotes  Footnote number Footnote as displayed on Home Health Compare  1 This agency provides services under a federal waiver program to non-traditional, chronic long term population.  2 This agency provides services to a special needs population.  3 Not Available.  4 The number of patient episodes for this measure is too small to report.  5 This measure currently does not have data or provider has been certified/recertified for less than 6 months.  6 The national average for this measure is not provided because of state-to-state differences in data  collection.  7 Medicare is not displaying rates for this measure for any home health agency, because of an issue with the data.  8 There were problems with the data and they are being corrected.  9 Zero, or very few, patients met the survey's rules for inclusion. The scores shown, if any, reflect a very small number of surveys and may not accurately tell how an agency is doing.  10 Survey results are based on less than 12 months of data.  11 Fewer than 70 patients completed the survey. Use the scores shown, if any, with caution as the number of surveys may be too low to accurately tell how an agency is doing.  12 No survey results are available for this period.  13 Data suppressed by CMS for one or more quarters.    

## 2019-06-17 NOTE — Care Management Obs Status (Signed)
[facility] NOTIFICATION   Patient Details  Name: Peter ROTHMEYER, MD MRN: [id number] Date of Birth: 30-May-1946   Medicare Observation Status Notification Given:  Yes    Joaquin Courts, RN 06/17/2019, 1:10 PM

## 2019-06-17 NOTE — Progress Notes (Signed)
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  Pt hopeful for dc home tomorrow.   Follow Up Recommendations  Follow surgeon's recommendation for DC plan and follow-up therapies     Equipment Recommendations  None recommended by PT    Recommendations for Other Services       Precautions / Restrictions Precautions Precautions: Knee;Fall Restrictions Weight Bearing Restrictions: No Other Position/Activity Restrictions: WBAT    Mobility  Bed Mobility               General bed mobility comments: Pt up in chair and requests back to same  Transfers Overall transfer level: Needs assistance Equipment used: Rolling walker (2 wheeled) Transfers: Sit to/from Stand Sit to Stand: Min assist         General transfer comment: cues for LE management and use of UEs to self assist  Ambulation/Gait Ambulation/Gait assistance: Min guard Gait Distance (Feet): 140 Feet Assistive device: Rolling walker (2 wheeled) Gait Pattern/deviations: Step-to pattern;Decreased step length - left;Decreased step length - right;Shuffle;Trunk flexed Gait velocity: decr   General Gait Details: cues for sequence, posture and position from [employer]             Wheelchair Mobility    Modified Rankin (Stroke Patients Only)       Balance Overall balance assessment: Mild deficits observed, not formally tested                                          Cognition Arousal/Alertness: Awake/alert Behavior During Therapy: WFL for tasks assessed/performed Overall Cognitive Status: Within Functional Limits for tasks assessed                                        Exercises Total Joint Exercises Ankle Circles/Pumps:  AROM;Both;15 reps;Supine Quad Sets: AROM;Both;10 reps;Supine Heel Slides: AAROM;Right;15 reps;Supine Straight Leg Raises: AAROM;AROM;Right;15 reps;Supine    General Comments        Pertinent Vitals/Pain Pain Assessment: 0-10 Pain Score: 6  Pain Location: L knee Pain Descriptors / Indicators: Aching;Sore Pain Intervention(s): Limited activity within patient's tolerance;Monitored during session;Premedicated before session;Ice applied    Home Living                      Prior Function            PT Goals (current goals can now be found in the care plan section) Acute Rehab PT Goals Patient Stated Goal: Regain IND PT Goal Formulation: With patient Time For Goal Achievement: 06/23/19 Potential to Achieve Goals: Good Progress towards PT goals: Progressing toward goals    Frequency    7X/week      PT Plan Current plan remains appropriate    Co-evaluation              AM-PAC PT "6 Clicks" Mobility   Outcome Measure  Help needed turning from your back to your side while in a flat bed without using bedrails?: A Little Help needed moving from lying on your back to sitting  on the side of a flat bed without using bedrails?: A Little Help needed moving to and from a bed to a chair (including a wheelchair)?: A Little Help needed standing up from a chair using your arms (e.g., wheelchair or bedside chair)?: A Little Help needed to walk in hospital room?: A Little Help needed climbing 3-5 steps with a railing? : A Lot 6 Click Score: 17    End of Session Equipment Utilized During Treatment: Gait belt Activity Tolerance: Patient tolerated treatment well Patient left: in chair;with call bell/phone within reach Nurse Communication: Mobility status PT Visit Diagnosis: Difficulty in walking, not elsewhere classified (R26.2)     Time: [id number] PT Time Calculation (min) (ACUTE ONLY): 30 min  Charges:  $Gait Training: 8-22 mins $Therapeutic Exercise: 8-22  mins                     [place] Pager [phone] Office [phone]    Aika Brzoska 06/17/2019, 11:05 AM

## 2019-06-17 NOTE — Plan of Care (Signed)
  Problem: Activity: Goal: Ability to avoid complications of mobility impairment will improve Outcome: Progressing   Problem: Pain Management: Goal: Pain level will decrease with appropriate interventions Outcome: Progressing   Problem: Clinical Measurements: Goal: Respiratory complications will improve Outcome: Progressing   Problem: Clinical Measurements: Goal: Will remain free from infection Outcome: Progressing

## 2019-06-17 NOTE — Plan of Care (Signed)
  Problem: Education: Goal: Knowledge of the prescribed therapeutic regimen will improve Outcome: Progressing   Problem: Activity: Goal: Ability to avoid complications of mobility impairment will improve Outcome: Progressing   Problem: Activity: Goal: Range of joint motion will improve Outcome: Progressing   Problem: Clinical Measurements: Goal: Postoperative complications will be avoided or minimized Outcome: Progressing   Problem: Pain Management: Goal: Pain level will decrease with appropriate interventions Outcome: Progressing   

## 2019-06-18 NOTE — Progress Notes (Signed)
Subjective: 2 Days Post-Op Procedure(s) (LRB): LEFT TOTAL KNEE ARTHROPLASTY (Left) Patient reports pain as moderate.  Worked well with therapy.  Objective: Vital signs in last 24 hours: Temp:  [97.4 F (36.3 C)-99 F (37.2 C)] 98.6 F (37 C) (08/23 0403) Pulse Rate:  [72-96] 96 (08/23 0403) Resp:  [16-18] 18 (08/23 0403) BP: (117-136)/(61-74) 136/74 (08/23 0403) SpO2:  [96 %-98 %] 96 % (08/23 0403)  Intake/Output from previous day: 08/22 0701 - 08/23 0700 In: 960 [P.O.:960] Out: 800 [Urine:800] Intake/Output this shift: Total I/O In: 240 [P.O.:240] Out: -   Recent Labs    06/17/19 0248  HGB 12.9*   Recent Labs    06/17/19 0248  WBC 13.6*  RBC 4.36  HCT 40.8  PLT 259   Recent Labs    06/17/19 0248  NA 135  K 3.6  CL 102  CO2 23  BUN 16  CREATININE 1.03  GLUCOSE 153*  CALCIUM 8.5*   No results for input(s): LABPT, INR in the last 72 hours.  Sensation intact distally Intact pulses distally Dorsiflexion/Plantar flexion intact Incision: dressing C/D/I No cellulitis present Compartment soft   Assessment/Plan: 2 Days Post-Op Procedure(s) (LRB): LEFT TOTAL KNEE ARTHROPLASTY (Left) Up with therapy Discharge home with home health this afternoon.      Mcarthur Rossetti 06/18/2019, 8:48 AM

## 2019-06-18 NOTE — Progress Notes (Signed)
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  Spouse present for family ed and with many questions - reviewed shower transfer, car transfer, home therex and stairs.   Follow Up Recommendations  Follow surgeon's recommendation for DC plan and follow-up therapies     Equipment Recommendations  None recommended by PT    Recommendations for Other Services       Precautions / Restrictions Precautions Precautions: Knee;Fall Restrictions Weight Bearing Restrictions: No Other Position/Activity Restrictions: WBAT    Mobility  Bed Mobility Overal bed mobility: Needs Assistance Bed Mobility: Supine to Sit     Supine to sit: Min guard;Supervision     General bed mobility comments: Pt up in chair and requests back to same  Transfers Overall transfer level: Needs assistance Equipment used: Rolling walker (2 wheeled) Transfers: Sit to/from Stand Sit to Stand: Min guard;Supervision         General transfer comment: cues for LE management and use of UEs to self assist  Ambulation/Gait Ambulation/Gait assistance: Min guard;Supervision Gait Distance (Feet): 100 Feet Assistive device: Rolling walker (2 wheeled) Gait Pattern/deviations: Step-to pattern;Decreased step length - left;Decreased step length - right;Shuffle;Trunk flexed Gait velocity: decr   General Gait Details: cues for sequence, posture and position from [employer] Stairs: Yes Stairs assistance: Min assist Stair Management: One rail Left;Step to pattern;Forwards;With crutches Number of Stairs: 5 General stair comments: cues for sequence and foot/crutch placement; spouse present and assisting; written instruction provided   Wheelchair Mobility    Modified Rankin (Stroke  Patients Only)       Balance Overall balance assessment: Mild deficits observed, not formally tested                                          Cognition Arousal/Alertness: Awake/alert Behavior During Therapy: WFL for tasks assessed/performed Overall Cognitive Status: Within Functional Limits for tasks assessed                                        Exercises Total Joint Exercises Ankle Circles/Pumps: AROM;Both;15 reps;Supine Quad Sets: AROM;Both;10 reps;Supine Heel Slides: AAROM;Right;15 reps;Supine Straight Leg Raises: AAROM;AROM;Right;Supine;20 reps Goniometric ROM: AAROM L knee -8 - 40 pain limited    General Comments        Pertinent Vitals/Pain Pain Assessment: 0-10 Pain Score: 7  Pain Location: L knee Pain Descriptors / Indicators: Aching;Sore Pain Intervention(s): Limited activity within patient's tolerance;Monitored during session;Premedicated before session    Home Living                      Prior Function            PT Goals (current goals can now be found in the care plan section) Acute Rehab PT Goals Patient Stated Goal: Regain IND PT Goal Formulation: With patient Time For Goal Achievement: 06/23/19 Potential to Achieve Goals: Good Progress towards PT goals: Progressing toward goals    Frequency    7X/week      PT Plan Current plan remains appropriate  Co-evaluation              AM-PAC PT "6 Clicks" Mobility   Outcome Measure  Help needed turning from your back to your side while in a flat bed without using bedrails?: A Little Help needed moving from lying on your back to sitting on the side of a flat bed without using bedrails?: A Little Help needed moving to and from a bed to a chair (including a wheelchair)?: A Little Help needed standing up from a chair using your arms (e.g., wheelchair or bedside chair)?: A Little Help needed to walk in hospital room?: A Little Help needed climbing  3-5 steps with a railing? : A Little 6 Click Score: 18    End of Session Equipment Utilized During Treatment: Gait belt Activity Tolerance: Patient tolerated treatment well Patient left: in chair;with call bell/phone within reach;with family/visitor present Nurse Communication: Mobility status PT Visit Diagnosis: Difficulty in walking, not elsewhere classified (R26.2)     Time: [id number] PT Time Calculation (min) (ACUTE ONLY): 38 min  Charges:  $Gait Training: 23-37 mins $Therapeutic Exercise: 8-22 mins $Therapeutic Activity: 8-22 mins                     [place] Pager [phone] Office [phone]    Jonuel Butterfield 06/18/2019, 12:08 PM

## 2019-06-18 NOTE — Plan of Care (Signed)
Pt stable at this time. Pt to d/c home with family. Pt has all needed dme at home.

## 2019-06-18 NOTE — Plan of Care (Signed)
Pt stable this am. Pt is hoping to go home today. Pt mobility has improved overall from yesterday. Rn continuing to medicate and assess for needs.

## 2019-06-18 NOTE — Discharge Summary (Signed)
Patient ID: Peter STAVIS, MD MRN: [id number] DOB/AGE: 71-Dec-1947 71 y.o.  Admit date: 06/16/2019 Discharge date: 06/18/2019  Admission Diagnoses:  Principal Problem:   Unilateral primary osteoarthritis, left knee Active Problems:   Status post total left knee replacement   Discharge Diagnoses:  Same  Past Medical History:  Diagnosis Date  . Allergic rhinitis   . Anxiety   . Cancer [facility])    Melanoma -peri umbilical '02 or '03- no further problems. 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   . Chest pain    Nuclear 2005, normal / coronary CTA 2008, slight mixed plaque, coronary calcium score 0.65, ejection fraction 55%, echo, March, 2008  . Complication of anesthesia    itching-not sure if oxycodone or anesthesia,and shakes. Right TMJ issue  chills in past  . Dizziness    Dizziness with question of presyncope April 08, 2011  . DJD (degenerative joint disease)    history DDD, fingers, knees, shoulders  . Ejection fraction    EF 55%, echo, March, 2008.  . Family history of Alzheimer's disease   . GERD (gastroesophageal reflux disease)   . Hearing impaired    bilateral hearing aids  . Heart murmur    73 yrs old rheumatic fever   . Heart murmur 2008   trivial tricusp regurg  . History of colonic polyps   . History of melanoma   . History of pseudogout   . History of recent steroid use    10-10-15 tapering steroid use for tx. recent bronchitis.  Marland Kitchen History of rheumatic fever   . HNP (herniated nucleus pulposus), cervical   . Hypercholesteremia    borderline  . Hypertension   . Lumbar spondylosis with myelopathy 06/09/2013  . Spondylosis   . Tinnitus    chronic  . 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 Patient has severe unremitting pain that affects sleep, daily activities, and work/hobbies. After pre-op clearance the patient was taken to the operating room on 06/16/2019 and underwent  Procedure(s): LEFT TOTAL KNEE ARTHROPLASTY.    Patient was given perioperative antibiotics:  Anti-infectives (From admission, onward)   Start     Dose/Rate Route Frequency Ordered Stop   06/16/19 1400  ceFAZolin (ANCEF) IVPB 1 g/50 mL premix     1 g 100 mL/hr over 30 Minutes Intravenous Every 6 hours 06/16/19 0933 06/16/19 2030   06/16/19 0600  ceFAZolin (ANCEF) IVPB 2g/100 mL premix     2 g 200 mL/hr over 30 Minutes Intravenous On call to O.R. 06/16/19 0531 06/16/19 0758       Patient was given sequential compression devices, early ambulation, and chemoprophylaxis to prevent DVT.  Patient benefited maximally from hospital stay and there were no complications.    Recent vital signs:  Patient Vitals for the past 24 hrs:  BP Temp Temp src Pulse Resp SpO2  06/18/19 0403 136/74 98.6 F (37 C) Oral 96 18 96 %  06/17/19 2112 121/68 99 F (37.2 C) Oral 92 16 97 %  06/17/19 1422 121/68 - - 86 16 -  06/17/19 1034 117/61 (!) 97.4 F (36.3 C) Oral 72 16 98 %     Recent laboratory studies:  Recent Labs    06/17/19 0248  WBC 13.6*  HGB 12.9*  HCT 40.8  PLT 259  NA 135  K 3.6  CL 102  CO2 23  BUN 16  CREATININE 1.03  GLUCOSE 153*  CALCIUM 8.5*     Discharge Medications:   Allergies as of 06/18/2019      Reactions   Gentamycin [gentamicin] Hives, Itching   Turmeric Other (See Comments)   Cardiac arrythmias   Bacitracin Rash   Hydrocodone Rash   Moxifloxacin Rash   pt had severe red rash- he thought from Avelox Rx....   Neosporin [neomycin-bacitracin Zn-polymyx] Rash   Other Other (See Comments)   Redness  bandaide   Promethazine Other (See Comments)   Restless legs   Tape Dermatitis   bandaid-skin reaction   Triamcinolone Acetonide Itching, Rash          Medication List    STOP taking these medications   celecoxib 100 MG capsule Commonly known as: CeleBREX   traMADol 50 MG tablet Commonly known as: ULTRAM     TAKE these medications   amLODipine 5 MG tablet Commonly known as: NORVASC Take 1 tablet (5 mg total) by mouth as directed. What changed: when to take this   colchicine 0.6 MG tablet TAKE 1 TABLET (0.6 MG TOTAL) BY MOUTH 2 (TWO) TIMES DAILY   HYDROmorphone 2 MG tablet Commonly known as: DILAUDID Take 1 tablet (2 mg total) by mouth every 4 (four) hours as needed for severe pain.   methocarbamol 500 MG tablet Commonly known as: ROBAXIN Take 1 tablet (500 mg total) by mouth every 6 (six) hours as needed for muscle spasms.   montelukast 10 MG tablet Commonly known as: SINGULAIR TAKE 1 TABLET BY MOUTH EVERYDAY AT BEDTIME What changed: See the new instructions.   pantoprazole 40 MG tablet Commonly known as: PROTONIX TAKE 1 TABLET BY MOUTH EVERY DAY 30 MINUTES BEFORE EVENING MEALS What changed: See the new instructions.   potassium chloride SA 20 MEQ tablet Commonly known as: K-DUR Take 1 tablet (20 mEq total) by mouth daily. What changed:   how much to take  when to take this  additional instructions   quinapril 10 MG tablet Commonly known as: ACCUPRIL Take 1 tablet (10 mg total) by mouth as directed. Cuts in half to make 15 What changed: when to take this   Restasis 0.05 % ophthalmic emulsion Generic drug: cycloSPORINE Place 1 drop into both eyes daily.   rivaroxaban 10 MG Tabs tablet Commonly known as: XARELTO Take 1 tablet (10 mg total) by mouth daily with breakfast.   rosuvastatin 10 MG tablet Commonly known as: CRESTOR Take 1 tablet (10 mg total) by mouth at bedtime.   triamterene-hydrochlorothiazide 37.5-25 MG tablet Commonly known as: MAXZIDE-25 Take 1 tablet by mouth daily.   Uroxatral 10 MG 24 hr tablet Generic drug: alfuzosin Take 10 mg by mouth every evening.   Vitamin D3 50 MCG  (2000 UT) Tabs Take by mouth.            Durable Medical Equipment  (From admission, onward)         Start     Ordered   06/16/19 1042  DME Walker rolling  Once    Question:  Patient needs a walker to treat with the following condition  Answer:  Status post total left knee replacement   06/16/19 1042   06/16/19 1042  DME 3 n 1  Once     06/16/19 1042  Diagnostic Studies: Dg Knee Left Port  Result Date: 06/16/2019 CLINICAL DATA:  Status post left knee replacement. EXAM: PORTABLE LEFT KNEE - 1-2 VIEW COMPARISON:  Radiograph of July 10, 2015. FINDINGS: The left femoral and tibial components are well situated. Expected postoperative changes are noted in the soft tissues anteriorly. No fracture or dislocation is noted. Vascular calcifications are noted. IMPRESSION: Status post left total knee arthroplasty. Electronically Signed   By: Marijo Conception [name]   On: 06/16/2019 10:07    Disposition: Discharge disposition: 01-Home or Self Care         Follow-up Information    Mcarthur Rossetti, MD. Go on 07/04/2019.   Specialty: Orthopedic Surgery Why: at 9:30 am for 2 week post-op appointment with Dr. Ninfa Linden. Contact information: [place] [address] [phone]        Home, [facility] up.   Specialty: Home Health Services Why: You have been authorized for [facility]. Someone from the home health agency will contact you at home after discharge to arrange your first visit. Contact information: [address] [place] [address] [phone]            Signed: Mcarthur Rossetti 06/18/2019, 8:50 AM

## 2019-06-18 NOTE — Plan of Care (Signed)
  Problem: Education: Goal: Knowledge of the prescribed therapeutic regimen will improve Outcome: Progressing   Problem: Pain Management: Goal: Pain level will decrease with appropriate interventions Outcome: Progressing   Problem: Activity: Goal: Range of joint motion will improve Outcome: Progressing   Problem: Activity: Goal: Risk for activity intolerance will decrease Outcome: Progressing   Problem: Coping: Goal: Level of anxiety will decrease Outcome: Progressing   Problem: Pain Managment: Goal: General experience of comfort will improve Outcome: Progressing

## 2019-06-18 NOTE — Progress Notes (Signed)
Physical Therapy Treatment Patient Details Name: Peter PARKMAN, MD MRN: [id number] DOB: 07/06/1946 Today's Date: 06/18/2019    History of Present Illness Pt s/p L TKR and with hx of R TKR and multiple spinal surgeries    PT Comments    Pt with increased pain/stiffness this date but progressed to negotiate stairs.  Spouse arriving later this morning for family ed.   Follow Up Recommendations  Follow surgeon's recommendation for DC plan and follow-up therapies     Equipment Recommendations  None recommended by PT    Recommendations for Other Services       Precautions / Restrictions Precautions Precautions: Knee;Fall Restrictions Weight Bearing Restrictions: No Other Position/Activity Restrictions: WBAT    Mobility  Bed Mobility Overal bed mobility: Needs Assistance Bed Mobility: Supine to Sit     Supine to sit: Min guard;Supervision     General bed mobility comments: use of leg lifter to assist L LE  Transfers Overall transfer level: Needs assistance Equipment used: Rolling walker (2 wheeled) Transfers: Sit to/from Stand Sit to Stand: Min guard;Supervision         General transfer comment: cues for LE management and use of UEs to self assist  Ambulation/Gait Ambulation/Gait assistance: Min guard;Supervision Gait Distance (Feet): 100 Feet Assistive device: Rolling walker (2 wheeled) Gait Pattern/deviations: Step-to pattern;Decreased step length - left;Decreased step length - right;Shuffle;Trunk flexed Gait velocity: decr   General Gait Details: cues for sequence, posture and position from [employer] Stairs: Yes Stairs assistance: Min assist Stair Management: One rail Left;Step to pattern;Forwards;With crutches Number of Stairs: 5 General stair comments: cues for sequence and foot/crutch placement   Wheelchair Mobility    Modified Rankin (Stroke Patients Only)       Balance Overall balance assessment: Mild deficits observed, not formally  tested                                          Cognition Arousal/Alertness: Awake/alert Behavior During Therapy: WFL for tasks assessed/performed Overall Cognitive Status: Within Functional Limits for tasks assessed                                        Exercises Total Joint Exercises Ankle Circles/Pumps: AROM;Both;15 reps;Supine Quad Sets: AROM;Both;10 reps;Supine Heel Slides: AAROM;Right;15 reps;Supine Straight Leg Raises: AAROM;AROM;Right;Supine;20 reps Goniometric ROM: AAROM L knee -8 - 40 pain limited    General Comments        Pertinent Vitals/Pain Pain Assessment: 0-10 Pain Score: 6  Pain Location: L knee Pain Descriptors / Indicators: Aching;Sore Pain Intervention(s): Limited activity within patient's tolerance;Monitored during session;Premedicated before session;Ice applied    Home Living                      Prior Function            PT Goals (current goals can now be found in the care plan section) Acute Rehab PT Goals Patient Stated Goal: Regain IND PT Goal Formulation: With patient Time For Goal Achievement: 06/23/19 Potential to Achieve Goals: Good Progress towards PT goals: Progressing toward goals    Frequency    7X/week      PT Plan Current plan remains appropriate    Co-evaluation  AM-PAC PT "6 Clicks" Mobility   Outcome Measure  Help needed turning from your back to your side while in a flat bed without using bedrails?: A Little Help needed moving from lying on your back to sitting on the side of a flat bed without using bedrails?: A Little Help needed moving to and from a bed to a chair (including a wheelchair)?: A Little Help needed standing up from a chair using your arms (e.g., wheelchair or bedside chair)?: A Little Help needed to walk in hospital room?: A Little Help needed climbing 3-5 steps with a railing? : A Little 6 Click Score: 18    End of Session Equipment  Utilized During Treatment: Gait belt Activity Tolerance: Patient tolerated treatment well Patient left: in chair;with call bell/phone within reach Nurse Communication: Mobility status PT Visit Diagnosis: Difficulty in walking, not elsewhere classified (R26.2)     Time: [id number] PT Time Calculation (min) (ACUTE ONLY): 38 min  Charges:  $Gait Training: 23-37 mins $Therapeutic Exercise: 8-22 mins                     [place] Pager [phone] Office [phone]    Alarik Radu 06/18/2019, 12:04 PM

## 2019-06-19 ENCOUNTER — Encounter (HOSPITAL_COMMUNITY): Payer: Self-pay | Admitting: Orthopaedic Surgery

## 2019-06-19 ENCOUNTER — Telehealth: Payer: Self-pay | Admitting: *Deleted

## 2019-06-19 DIAGNOSIS — M19011 Primary osteoarthritis, right shoulder: Secondary | ICD-10-CM | POA: Diagnosis not present

## 2019-06-19 DIAGNOSIS — Z471 Aftercare following joint replacement surgery: Secondary | ICD-10-CM | POA: Diagnosis not present

## 2019-06-19 DIAGNOSIS — G629 Polyneuropathy, unspecified: Secondary | ICD-10-CM | POA: Diagnosis not present

## 2019-06-19 DIAGNOSIS — M4716 Other spondylosis with myelopathy, lumbar region: Secondary | ICD-10-CM | POA: Diagnosis not present

## 2019-06-19 DIAGNOSIS — M4807 Spinal stenosis, lumbosacral region: Secondary | ICD-10-CM | POA: Diagnosis not present

## 2019-06-19 DIAGNOSIS — H9190 Unspecified hearing loss, unspecified ear: Secondary | ICD-10-CM | POA: Diagnosis not present

## 2019-06-19 DIAGNOSIS — M5136 Other intervertebral disc degeneration, lumbar region: Secondary | ICD-10-CM | POA: Diagnosis not present

## 2019-06-19 DIAGNOSIS — M48062 Spinal stenosis, lumbar region with neurogenic claudication: Secondary | ICD-10-CM | POA: Diagnosis not present

## 2019-06-19 DIAGNOSIS — M19012 Primary osteoarthritis, left shoulder: Secondary | ICD-10-CM | POA: Diagnosis not present

## 2019-06-19 DIAGNOSIS — Z96652 Presence of left artificial knee joint: Secondary | ICD-10-CM | POA: Diagnosis not present

## 2019-06-19 DIAGNOSIS — E7800 Pure hypercholesterolemia, unspecified: Secondary | ICD-10-CM | POA: Diagnosis not present

## 2019-06-19 DIAGNOSIS — I10 Essential (primary) hypertension: Secondary | ICD-10-CM | POA: Diagnosis not present

## 2019-06-19 DIAGNOSIS — Z85820 Personal history of malignant melanoma of skin: Secondary | ICD-10-CM | POA: Diagnosis not present

## 2019-06-19 DIAGNOSIS — K219 Gastro-esophageal reflux disease without esophagitis: Secondary | ICD-10-CM | POA: Diagnosis not present

## 2019-06-19 DIAGNOSIS — M5020 Other cervical disc displacement, unspecified cervical region: Secondary | ICD-10-CM | POA: Diagnosis not present

## 2019-06-19 DIAGNOSIS — F419 Anxiety disorder, unspecified: Secondary | ICD-10-CM | POA: Diagnosis not present

## 2019-06-19 DIAGNOSIS — Z86010 Personal history of colonic polyps: Secondary | ICD-10-CM | POA: Diagnosis not present

## 2019-06-19 DIAGNOSIS — Z7901 Long term (current) use of anticoagulants: Secondary | ICD-10-CM | POA: Diagnosis not present

## 2019-06-19 NOTE — Telephone Encounter (Signed)
D/C Ortho Bundle call completed.

## 2019-06-19 NOTE — Care Plan (Signed)
RNCM called patient today to check status after discharge home yesterday, 06/18/19. Spoke with his wife, with patient in the background. They have heard from therapy today ([facility]) and someone will be out today for his initial therapy evaluation. Patient's wife states he is in pain, but is aware this is expected. Moderate swelling verbalized. Also informed that patient did have a mild fever of 101 during the night, which came down with Tylenol. Patient's wife called later in the day and verbalized that her husband now has a temperature of 100.8. Requested what to do. RNCM spoke with Dr. Ninfa Linden, who wanted patient to continue with Tylenol, use incentive spirometer, do deep breathing/coughing exercises. RNCM relayed message. Will continue to check status throughout this week. HHPT arrived as RNCM was talking with patient's wife in the afternoon.

## 2019-06-20 ENCOUNTER — Telehealth: Payer: Self-pay | Admitting: *Deleted

## 2019-06-20 NOTE — Care Plan (Signed)
RNCM received call from patient in the morning re-informing that he ran a fever of 101 on the night home from the hospital, which decreased with Tylenol. Then yesterday afternoon had another low grade fever of 100.8. He verbalized this morning his temperature is 99.8 and he is going to take Tylenol for this. He also wanted to relay that he had one episode of burning with urination during the night. RNCM made Dr. Ninfa Linden aware and Dr. Ninfa Linden feels the increased temperature is a result of the normal post-surgery recovery. If any further burning with urination should occur, RNCM will instruct patient to contact his PCP. RNCM spoke with patient's wife later in the day and relayed information. She indicated there have been no further fevers today. Will continue to check on patient this week.

## 2019-06-20 NOTE — Telephone Encounter (Signed)
RNCM call completed.

## 2019-06-21 DIAGNOSIS — M19012 Primary osteoarthritis, left shoulder: Secondary | ICD-10-CM | POA: Diagnosis not present

## 2019-06-21 DIAGNOSIS — I10 Essential (primary) hypertension: Secondary | ICD-10-CM | POA: Diagnosis not present

## 2019-06-21 DIAGNOSIS — M4716 Other spondylosis with myelopathy, lumbar region: Secondary | ICD-10-CM | POA: Diagnosis not present

## 2019-06-21 DIAGNOSIS — M19011 Primary osteoarthritis, right shoulder: Secondary | ICD-10-CM | POA: Diagnosis not present

## 2019-06-21 DIAGNOSIS — M4807 Spinal stenosis, lumbosacral region: Secondary | ICD-10-CM | POA: Diagnosis not present

## 2019-06-21 DIAGNOSIS — Z471 Aftercare following joint replacement surgery: Secondary | ICD-10-CM | POA: Diagnosis not present

## 2019-06-23 DIAGNOSIS — Z471 Aftercare following joint replacement surgery: Secondary | ICD-10-CM | POA: Diagnosis not present

## 2019-06-23 DIAGNOSIS — M19011 Primary osteoarthritis, right shoulder: Secondary | ICD-10-CM | POA: Diagnosis not present

## 2019-06-23 DIAGNOSIS — M4807 Spinal stenosis, lumbosacral region: Secondary | ICD-10-CM | POA: Diagnosis not present

## 2019-06-23 DIAGNOSIS — M4716 Other spondylosis with myelopathy, lumbar region: Secondary | ICD-10-CM | POA: Diagnosis not present

## 2019-06-23 DIAGNOSIS — M19012 Primary osteoarthritis, left shoulder: Secondary | ICD-10-CM | POA: Diagnosis not present

## 2019-06-23 DIAGNOSIS — I10 Essential (primary) hypertension: Secondary | ICD-10-CM | POA: Diagnosis not present

## 2019-06-26 ENCOUNTER — Telehealth: Payer: Self-pay | Admitting: *Deleted

## 2019-06-26 DIAGNOSIS — Z471 Aftercare following joint replacement surgery: Secondary | ICD-10-CM | POA: Diagnosis not present

## 2019-06-26 DIAGNOSIS — M19012 Primary osteoarthritis, left shoulder: Secondary | ICD-10-CM | POA: Diagnosis not present

## 2019-06-26 DIAGNOSIS — M4807 Spinal stenosis, lumbosacral region: Secondary | ICD-10-CM | POA: Diagnosis not present

## 2019-06-26 DIAGNOSIS — M19011 Primary osteoarthritis, right shoulder: Secondary | ICD-10-CM | POA: Diagnosis not present

## 2019-06-26 DIAGNOSIS — I10 Essential (primary) hypertension: Secondary | ICD-10-CM | POA: Diagnosis not present

## 2019-06-26 DIAGNOSIS — M4716 Other spondylosis with myelopathy, lumbar region: Secondary | ICD-10-CM | POA: Diagnosis not present

## 2019-06-26 NOTE — Telephone Encounter (Signed)
7 day Ortho bundle call completed. 

## 2019-06-26 NOTE — Care Plan (Signed)
RNCM spoke with patient on Friday, 06/23/19 for a 1 week post-op call to check status. RNCM was unable to document due to issues with Epic in the afternoon. Patient on speaker phone with his wife, who verbalized concerns regarding continued severe pain. He reports he continues to take Dilaudid scheduled twice per day and "can't do anything unless I am taking this". He verbalized that therapy is going slowly due to pain. He verbalized he is at less than 60 degrees flexion at this time and is only able to do exercises when he takes the Dilaudid twice daily. He is concerned regarding transitioning over to OPPT when the time comes and states, I do not want to be exposed to Covid-19 in a facility. Explained that OPPT will be an integral part of his rehab and he still has several more visits with HHPT before that transition. Also will discuss at his first post-op visit with Dr. Ninfa Linden on 07/04/19. Spoke with Dr. Ninfa Linden regarding if there is a need to move appointment up sooner than this date and no changes were needed at this time. Will continue to follow up with patient for CM needs.

## 2019-06-26 NOTE — Telephone Encounter (Signed)
Patient called and states he will be needing refill of Dilaudid by mid to end of week and wanted to notify now. Made aware we would keep his appointment as scheduled for 07/04/19.

## 2019-06-27 MED ORDER — HYDROMORPHONE HCL 2 MG PO TABS: 2.0000 mg | ORAL_TABLET | ORAL | 0 refills | Status: DC | PRN

## 2019-06-28 DIAGNOSIS — M4807 Spinal stenosis, lumbosacral region: Secondary | ICD-10-CM | POA: Diagnosis not present

## 2019-06-28 DIAGNOSIS — M19011 Primary osteoarthritis, right shoulder: Secondary | ICD-10-CM | POA: Diagnosis not present

## 2019-06-28 DIAGNOSIS — Z471 Aftercare following joint replacement surgery: Secondary | ICD-10-CM | POA: Diagnosis not present

## 2019-06-28 DIAGNOSIS — M4716 Other spondylosis with myelopathy, lumbar region: Secondary | ICD-10-CM | POA: Diagnosis not present

## 2019-06-28 DIAGNOSIS — I10 Essential (primary) hypertension: Secondary | ICD-10-CM | POA: Diagnosis not present

## 2019-06-28 DIAGNOSIS — M19012 Primary osteoarthritis, left shoulder: Secondary | ICD-10-CM | POA: Diagnosis not present

## 2019-06-30 ENCOUNTER — Telehealth: Payer: Self-pay | Admitting: *Deleted

## 2019-06-30 DIAGNOSIS — M4807 Spinal stenosis, lumbosacral region: Secondary | ICD-10-CM | POA: Diagnosis not present

## 2019-06-30 DIAGNOSIS — I10 Essential (primary) hypertension: Secondary | ICD-10-CM | POA: Diagnosis not present

## 2019-06-30 DIAGNOSIS — M4716 Other spondylosis with myelopathy, lumbar region: Secondary | ICD-10-CM | POA: Diagnosis not present

## 2019-06-30 DIAGNOSIS — M19012 Primary osteoarthritis, left shoulder: Secondary | ICD-10-CM | POA: Diagnosis not present

## 2019-06-30 DIAGNOSIS — Z471 Aftercare following joint replacement surgery: Secondary | ICD-10-CM | POA: Diagnosis not present

## 2019-06-30 DIAGNOSIS — M19011 Primary osteoarthritis, right shoulder: Secondary | ICD-10-CM | POA: Diagnosis not present

## 2019-06-30 NOTE — Telephone Encounter (Signed)
RNCM received call from patient asking if he should restart his Celebrex that he was taking prior to his surgery. He is very concerned regarding inflammation, swelling and pain as discussed earlier in the day. He informed he took Xarelto for 10 days post-surgery and this was stopped around 4 days ago. RNCM spoke with MD and confirmed since patient is no longer taking his anticoagulant, then he would like to restart the Celebrex 100 mg twice daily for the next 2-3 weeks. RNCM contacted patient and gave information from MD. Patient verbalized understanding of new medication instructions. F/U remains scheduled for 07/04/19 with Dr. Ninfa Linden.

## 2019-06-30 NOTE — Telephone Encounter (Signed)
14 day Ortho Bundle call completed.

## 2019-07-04 ENCOUNTER — Encounter: Payer: Self-pay | Admitting: Orthopaedic Surgery

## 2019-07-04 ENCOUNTER — Ambulatory Visit (INDEPENDENT_AMBULATORY_CARE_PROVIDER_SITE_OTHER): Payer: Medicare Other | Admitting: Orthopaedic Surgery

## 2019-07-04 DIAGNOSIS — Z96652 Presence of left artificial knee joint: Secondary | ICD-10-CM

## 2019-07-04 MED ORDER — CELECOXIB 100 MG PO CAPS: 100.0000 mg | ORAL_CAPSULE | Freq: Two times a day (BID) | ORAL | 3 refills | Status: DC | PRN

## 2019-07-04 NOTE — Progress Notes (Signed)
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  He is walking without assistive device.  His home therapy is stopped and is ready for outpatient therapy.  He seems to be doing well overall.  He has been on Celebrex 100 mg once daily but I will increase this to twice daily.  He has stopped Dilaudid.  He is having trouble sleeping at night.  On examination of his left knee his incision looks great.  I placed new Steri-Strips.  His extension is almost full and his flexion is to about 80 to 85 degrees.  I feel like he is making actually good progress at this point.  I talked about the necessity for outpatient physical therapy and will have this set up for him.  He will push himself daily.  We will send in some more Celebrex.  Follow-up will be in 4 weeks.  No x-rays will be needed.  This is mainly for clinical exam to see how his mobility and motion are coming along.

## 2019-07-05 DIAGNOSIS — M19011 Primary osteoarthritis, right shoulder: Secondary | ICD-10-CM | POA: Diagnosis not present

## 2019-07-05 DIAGNOSIS — I10 Essential (primary) hypertension: Secondary | ICD-10-CM | POA: Diagnosis not present

## 2019-07-05 DIAGNOSIS — M4716 Other spondylosis with myelopathy, lumbar region: Secondary | ICD-10-CM | POA: Diagnosis not present

## 2019-07-05 DIAGNOSIS — M4807 Spinal stenosis, lumbosacral region: Secondary | ICD-10-CM | POA: Diagnosis not present

## 2019-07-05 DIAGNOSIS — M19012 Primary osteoarthritis, left shoulder: Secondary | ICD-10-CM | POA: Diagnosis not present

## 2019-07-05 DIAGNOSIS — Z471 Aftercare following joint replacement surgery: Secondary | ICD-10-CM | POA: Diagnosis not present

## 2019-07-05 NOTE — Care Plan (Signed)
RNCM met with patient during his 2 week office visit with Dr. Blackman. He ambulates into office without assistive device and only slight antalgic gait. He is a little over 2 weeks from a left total knee arthroplasty. Encouragement provided as he has not felt he was doing well up to this point with regards to his rehab. Swelling is present, but minimal. He verbalized he does not want a refill of Dilaudid at this time. Refill of Celebrex provided by Dr. Blackman with instructions to continue with 100 mg twice daily. May now start Outpatient Rehab. RNCM contacted [facility] and scheduled patient for initial evaluation. First available on Monday, 07/10/19 at 12:00 pm. Staff have placed patient on cancellation list if something comes available sooner this week. Home health PT will continue through the end of the week with [facility], so patient will not backslide in progress while waiting for OPPT. F/u in 4 weeks with MD. Overall, patient looks very good today. 

## 2019-07-07 DIAGNOSIS — M19012 Primary osteoarthritis, left shoulder: Secondary | ICD-10-CM | POA: Diagnosis not present

## 2019-07-07 DIAGNOSIS — I10 Essential (primary) hypertension: Secondary | ICD-10-CM | POA: Diagnosis not present

## 2019-07-07 DIAGNOSIS — M19011 Primary osteoarthritis, right shoulder: Secondary | ICD-10-CM | POA: Diagnosis not present

## 2019-07-07 DIAGNOSIS — M4716 Other spondylosis with myelopathy, lumbar region: Secondary | ICD-10-CM | POA: Diagnosis not present

## 2019-07-07 DIAGNOSIS — M4807 Spinal stenosis, lumbosacral region: Secondary | ICD-10-CM | POA: Diagnosis not present

## 2019-07-07 DIAGNOSIS — Z471 Aftercare following joint replacement surgery: Secondary | ICD-10-CM | POA: Diagnosis not present

## 2019-07-08 ENCOUNTER — Other Ambulatory Visit: Payer: Self-pay | Admitting: Pulmonary Disease

## 2019-07-10 DIAGNOSIS — M6281 Muscle weakness (generalized): Secondary | ICD-10-CM | POA: Diagnosis not present

## 2019-07-10 DIAGNOSIS — R262 Difficulty in walking, not elsewhere classified: Secondary | ICD-10-CM | POA: Diagnosis not present

## 2019-07-10 DIAGNOSIS — M25562 Pain in left knee: Secondary | ICD-10-CM | POA: Diagnosis not present

## 2019-07-10 DIAGNOSIS — M1712 Unilateral primary osteoarthritis, left knee: Secondary | ICD-10-CM | POA: Diagnosis not present

## 2019-07-12 DIAGNOSIS — M6281 Muscle weakness (generalized): Secondary | ICD-10-CM | POA: Diagnosis not present

## 2019-07-12 DIAGNOSIS — R262 Difficulty in walking, not elsewhere classified: Secondary | ICD-10-CM | POA: Diagnosis not present

## 2019-07-12 DIAGNOSIS — M1712 Unilateral primary osteoarthritis, left knee: Secondary | ICD-10-CM | POA: Diagnosis not present

## 2019-07-12 DIAGNOSIS — M25562 Pain in left knee: Secondary | ICD-10-CM | POA: Diagnosis not present

## 2019-07-16 IMAGING — MR MR LUMBAR SPINE WO/W CM
8 series · 44 of 48 positions shown · IV contrast (multihance)
Comparison: Radiography 09/22/2017. CT 04/06/2016. MRI 04/06/2016.
MRI 01/18/2015.

CLINICAL DATA: Previous fusion L3 through L5. Treated for stenosis
above the fusion level with multiple previous epidural injections.
L5-S1 degenerative changes. Worsening of lower back pain in
particular recently.

EXAM:
MRI LUMBAR SPINE WITHOUT AND WITH CONTRAST
TECHNIQUE: Multiplanar and multiecho pulse sequences of the lumbar spine were
obtained without and with intravenous contrast.
CONTRAST:  16mL MULTIHANCE GADOBENATE DIMEGLUMINE 529 MG/ML IV SOLN

[Series 2: tirm sag · sagittal · 4.0mm · 0.55mm/px · 2 of 17 slices shown]
[im 1/17]
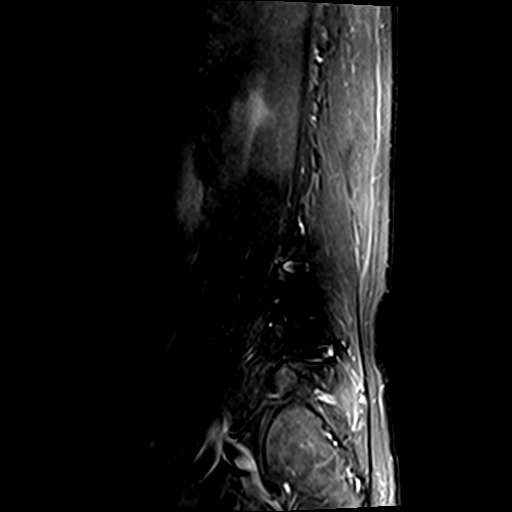
[im 5/17]
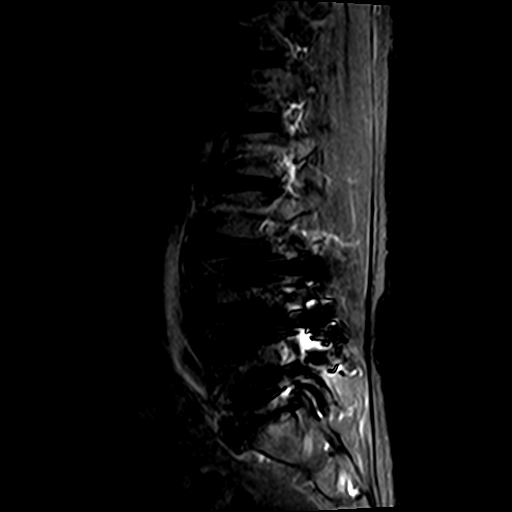

[Series 3: T1 · sagittal · 4.0mm · 0.88mm/px · 4 of 17 slices shown (1 of 3)]
[im 1/17]
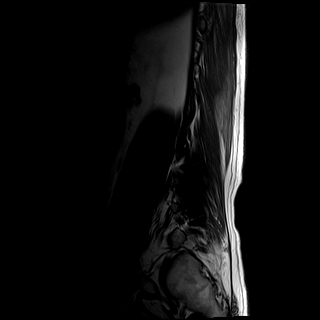
[im 6/17]
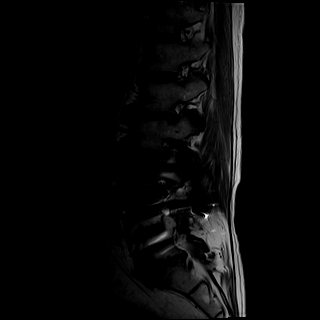
[im 11/17]
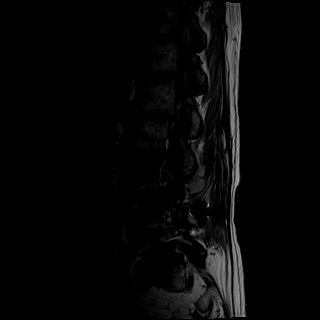
[im 17/17]
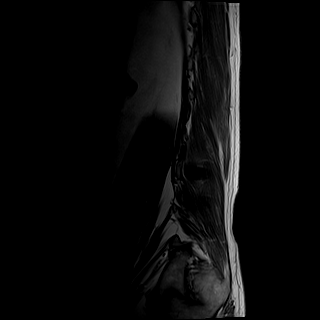

[Series 4: T2 · axial · 4.0mm · 0.70mm/px · z∈[-100,+108]mm · 9 of 38 slices shown (1 of 2)]
[im 1/38]
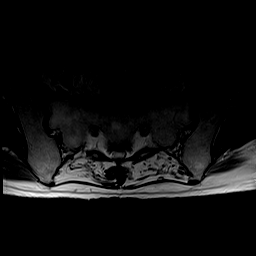
[im 5/38]
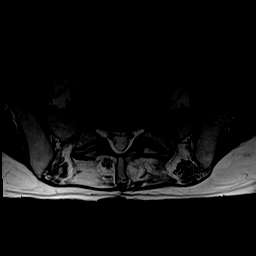
[im 10/38]
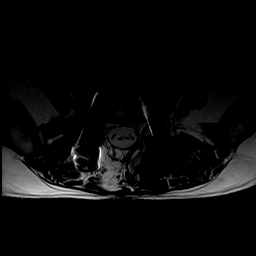
[im 14/38]
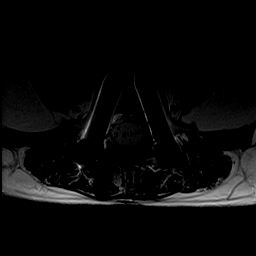
[im 19/38]
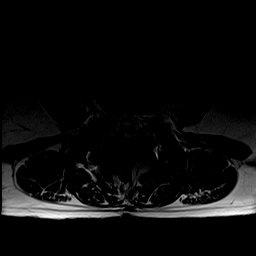
[im 24/38]
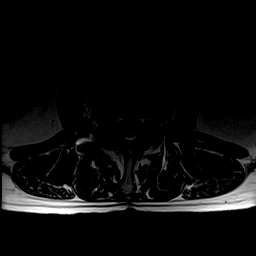
[im 28/38]
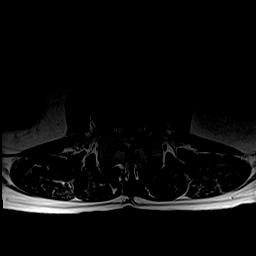
[im 33/38]
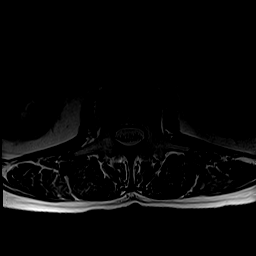
[im 38/38]
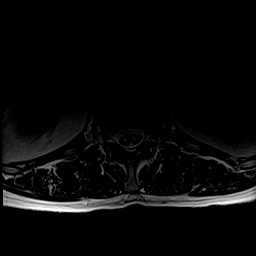

[Series 5: T1 · axial · 4.0mm · 0.70mm/px · z∈[-100,+108]mm · 9 of 38 slices shown (2 of 3)]
[im 1/38]
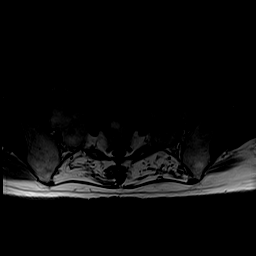
[im 5/38]
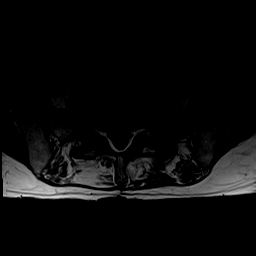
[im 10/38]
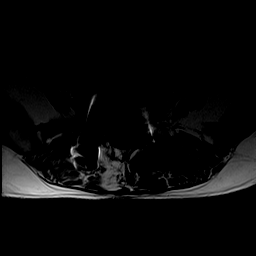
[im 14/38]
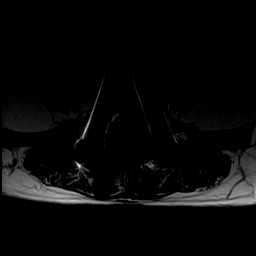
[im 19/38]
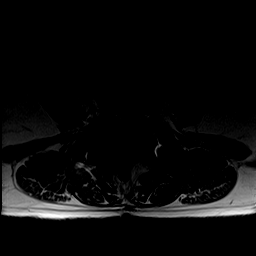
[im 24/38]
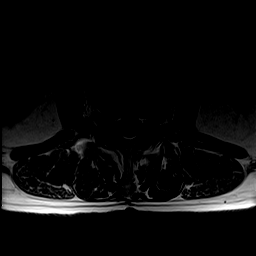
[im 28/38]
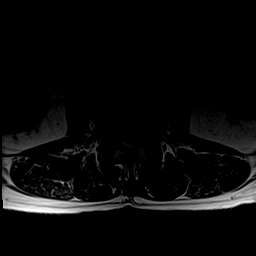
[im 33/38]
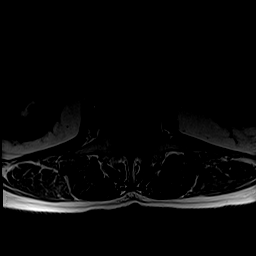
[im 38/38]
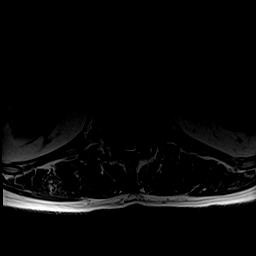

[Series 6: T2 · sagittal · 4.0mm · 0.88mm/px · 4 of 17 slices shown (2 of 2)]
[im 1/17]
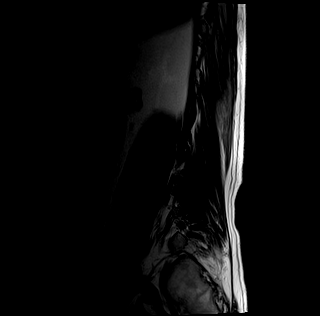
[im 6/17]
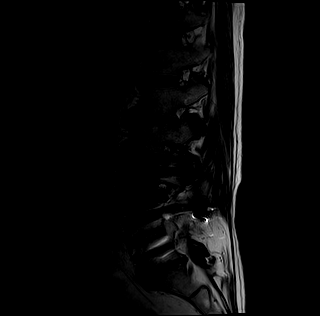
[im 11/17]
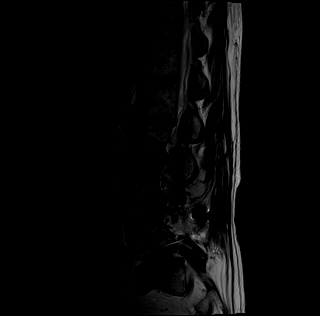
[im 17/17]
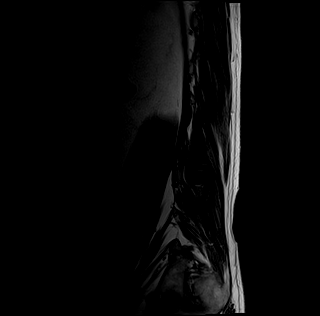

[Series 7: T1 fat-sat · sagittal · 4.0mm · 0.88mm/px · 4 of 17 slices shown]
[im 1/17]
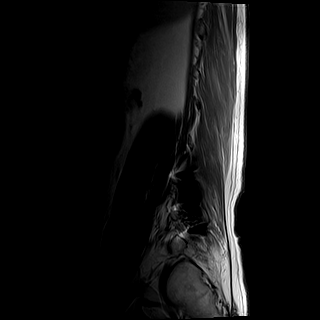
[im 6/17]
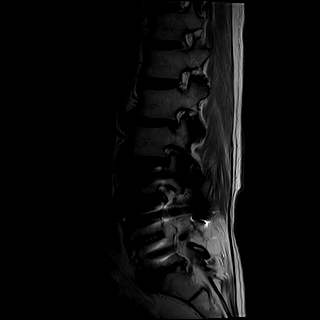
[im 11/17]
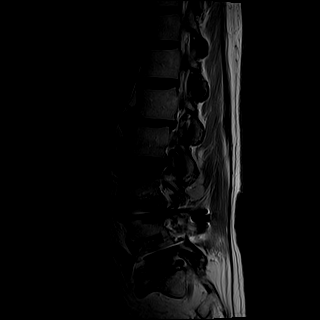
[im 17/17]
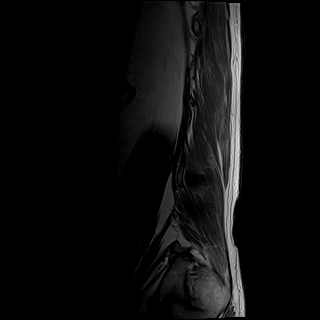

[Series 8: T1 fat-sat post-contrast · sagittal · 4.0mm · 0.88mm/px · 4 of 17 slices shown]
[im 1/17]
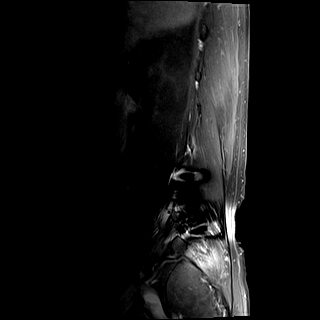
[im 6/17]
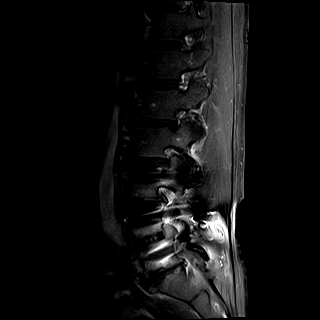
[im 11/17]
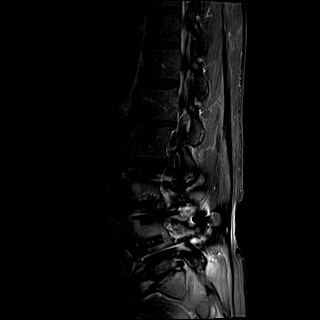
[im 17/17]
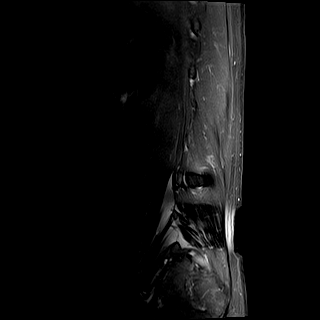

[Series 9: T1 · axial · 4.0mm · 0.70mm/px · z∈[-100,+108]mm · 8 of 38 slices shown (3 of 3)]
[im 1/38]
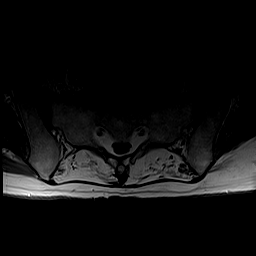
[im 5/38]
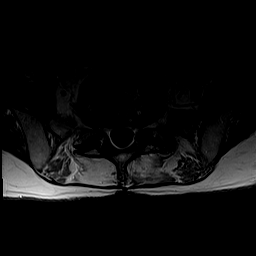
[im 10/38]
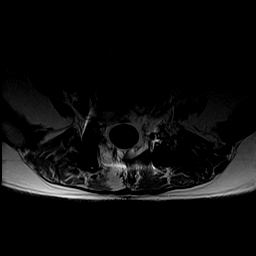
[im 14/38]
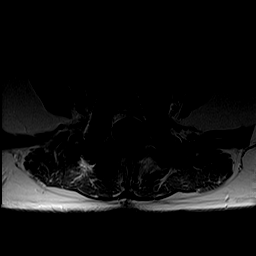
[im 24/38]
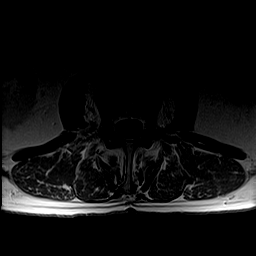
[im 28/38]
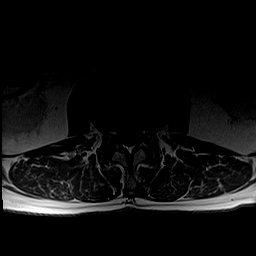
[im 33/38]
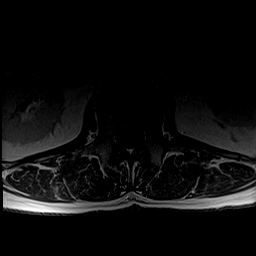
[im 38/38]
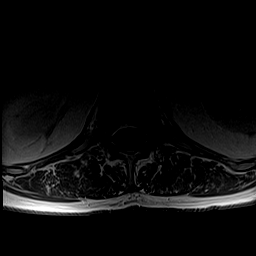

[44 of 48 positions shown; findings below may reference images not displayed]

FINDINGS: Segmentation: 5 lumbar type vertebral bodies as numbered previously.

Alignment:  4 mm retrolisthesis L1-2.  2.5 mm retrolisthesis L2-3.

Vertebrae: Previous posterior decompression, diskectomy and fusion
procedure from L3 through L5. See below. No other primary bone
finding.

Conus medullaris and cauda equina: Conus extends to the T12-L1
level. Conus and cauda equina appear normal.

Paraspinal and other soft tissues: Normal

Disc levels:

T10-11, T11-12 and T12-L1:  Normal.

L1-2: Retrolisthesis of 4 mm. Circumferential bulging of the disc.
Mild facet and ligamentous hypertrophy. Mild narrowing of the
lateral recesses without likely neural compression.

L2-3: Retrolisthesis of 2 mm. Circumferential bulging of the disc
slightly more prominent towards the right. Facet and ligamentous
hypertrophy. Multifactorial spinal stenosis of the central canal,
both lateral recesses and neural foramina, similar to the previous
study. Findings at this level could certainly be symptomatic.

L3-4: Particularly in correlation with the flexion extension
radiographs done yesterday, I believe the evidence points to non
union at this level with rocking motion. The central canal as well
decompressed and widely patent. Mild foraminal encroachment by
calcified annulus without compressive foraminal stenosis.

L4-5: Previous fusion is solid with wide patency of the central
canal. Chronic encroachment upon the foramen on the right by fusion
bone as seen previously.

L5-S1: Chronic disc degeneration with slight further desiccation of
the disc and loss of disc height. Endplate osteophytes and bulging
of the disc. Discogenic endplate edematous changes which could be
associated with back pain. Facet degeneration and hypertrophy worse
on the left than the right. No compressive central canal stenosis.
Mild foraminal narrowing because of encroachment by bulging disc and
facet hypertrophy but without distinct compression of the exiting L5
nerves.
IMPRESSION: L1-2: Chronic degenerative changes with retrolisthesis and bulging
of the disc. Mild lateral recess narrowing without demonstrable
neural compression.

L2-3: Chronic mild truck oral spinal stenosis due to retrolisthesis,
circumferential protrusion of the disc and facet and ligamentous
hypertrophy. Very similar appearance to the previous exam. Continued
potential for neural compression at this level.

Previous decompression and fusion L3 through L5. Clear solid union
at the L4-5 level as seen previously. Particularly in correlation
with the flexion extension radiographs done yesterday, I think there
is probably nonunion at the L3-4 level. Despite this, there is no
compressive narrowing of the central canal in there is only mild
foraminal narrowing.

Progressive disc degeneration at L5-S1 with further disc desiccation
and loss of height. Endplate osteophytes and bulging of the disc.
Facet degeneration left more than right. No compressive canal
stenosis. Mild foraminal narrowing would have some potential to
affect the L5 nerves, though distinct compression is not
established. Discogenic endplate marrow changes could be associated
with low back pain.

## 2019-07-18 DIAGNOSIS — M6281 Muscle weakness (generalized): Secondary | ICD-10-CM | POA: Diagnosis not present

## 2019-07-18 DIAGNOSIS — M25562 Pain in left knee: Secondary | ICD-10-CM | POA: Diagnosis not present

## 2019-07-18 DIAGNOSIS — R262 Difficulty in walking, not elsewhere classified: Secondary | ICD-10-CM | POA: Diagnosis not present

## 2019-07-18 DIAGNOSIS — M1712 Unilateral primary osteoarthritis, left knee: Secondary | ICD-10-CM | POA: Diagnosis not present

## 2019-07-20 ENCOUNTER — Telehealth: Payer: Self-pay | Admitting: *Deleted

## 2019-07-20 DIAGNOSIS — Z23 Encounter for immunization: Secondary | ICD-10-CM | POA: Diagnosis not present

## 2019-07-20 NOTE — Care Plan (Signed)
RNCM left VM for patient on Tuesday, 07/18/19 in attempt to contact for 30 day phone call and survey. Patient returned call on Wednesday, 07/19/19 to RNCM. He verbalized he is doing well with OPPT, but is still concerned regarding his flexion. Discussed several things he can continue to do at home to help increase flexion along with continued therapy. He verbalized he does not want a manipulation. Continued to provide encouragement and reviewed the 30 day Ortho bundle survey. TOM/THN 4 question survey reviewed and answered in place of the 10 question survey previously used in [employer]. 1. Prior to surgery I was provided sufficient education regarding my surgery and the bundle program. Patient answer-Strongly agree 2. I was satisfied with the care I received at the facility where my surgery was performed. Patient answer- Strongly agree 3. Following surgery, I received sufficient postoperative care instructions. Patient answer- strongly agree 4. I would recommend my surgeon and this bundle program to others- Strongly agree

## 2019-07-20 NOTE — Telephone Encounter (Signed)
30 day Ortho bundle call and survey completed. 

## 2019-07-21 DIAGNOSIS — M6281 Muscle weakness (generalized): Secondary | ICD-10-CM | POA: Diagnosis not present

## 2019-07-21 DIAGNOSIS — M25562 Pain in left knee: Secondary | ICD-10-CM | POA: Diagnosis not present

## 2019-07-21 DIAGNOSIS — M1712 Unilateral primary osteoarthritis, left knee: Secondary | ICD-10-CM | POA: Diagnosis not present

## 2019-07-21 DIAGNOSIS — R262 Difficulty in walking, not elsewhere classified: Secondary | ICD-10-CM | POA: Diagnosis not present

## 2019-07-25 DIAGNOSIS — R262 Difficulty in walking, not elsewhere classified: Secondary | ICD-10-CM | POA: Diagnosis not present

## 2019-07-25 DIAGNOSIS — M25562 Pain in left knee: Secondary | ICD-10-CM | POA: Diagnosis not present

## 2019-07-25 DIAGNOSIS — M6281 Muscle weakness (generalized): Secondary | ICD-10-CM | POA: Diagnosis not present

## 2019-07-25 DIAGNOSIS — M1712 Unilateral primary osteoarthritis, left knee: Secondary | ICD-10-CM | POA: Diagnosis not present

## 2019-07-27 DIAGNOSIS — M6281 Muscle weakness (generalized): Secondary | ICD-10-CM | POA: Diagnosis not present

## 2019-07-27 DIAGNOSIS — M1712 Unilateral primary osteoarthritis, left knee: Secondary | ICD-10-CM | POA: Diagnosis not present

## 2019-07-27 DIAGNOSIS — M25562 Pain in left knee: Secondary | ICD-10-CM | POA: Diagnosis not present

## 2019-07-27 DIAGNOSIS — R262 Difficulty in walking, not elsewhere classified: Secondary | ICD-10-CM | POA: Diagnosis not present

## 2019-07-31 DIAGNOSIS — M6281 Muscle weakness (generalized): Secondary | ICD-10-CM | POA: Diagnosis not present

## 2019-07-31 DIAGNOSIS — M1712 Unilateral primary osteoarthritis, left knee: Secondary | ICD-10-CM | POA: Diagnosis not present

## 2019-07-31 DIAGNOSIS — R262 Difficulty in walking, not elsewhere classified: Secondary | ICD-10-CM | POA: Diagnosis not present

## 2019-07-31 DIAGNOSIS — M25562 Pain in left knee: Secondary | ICD-10-CM | POA: Diagnosis not present

## 2019-08-01 ENCOUNTER — Encounter: Payer: Self-pay | Admitting: Orthopaedic Surgery

## 2019-08-01 ENCOUNTER — Telehealth: Payer: Self-pay | Admitting: *Deleted

## 2019-08-01 ENCOUNTER — Ambulatory Visit (INDEPENDENT_AMBULATORY_CARE_PROVIDER_SITE_OTHER): Payer: Medicare Other | Admitting: Orthopaedic Surgery

## 2019-08-01 DIAGNOSIS — Z96652 Presence of left artificial knee joint: Secondary | ICD-10-CM

## 2019-08-01 NOTE — Care Plan (Signed)
RNCM saw patient while in office today for his 6 week f/u with Dr. Ninfa Linden. He is doing extremely well. The knee remains slightly swollen and is using ice/anti-inflammatories, and still some Dilaudid (very minimally) to help with pain. He can almost fully straighten the knee and has about 100 degrees flexion. MD agreeable to continued OPPT to help with getting as close to 120 degrees as possible. F/U in 4 weeks with Dr. Ninfa Linden. No refills needed at this time.

## 2019-08-01 NOTE — Progress Notes (Signed)
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  On examination today his extension is almost full.  I can flex him to about 100 degrees and he wants to push himself further and I agree with this as well.  We will need to stand his physical therapy for several more weeks to hopefully maximize his range of motion.  On examination there is still moderate swelling of the knee.  His calf is soft.  His incisions well-healed.  His left knee is ligamentously stable.  He will continue to push himself through therapy and I agree with me that he still has an outpatient.  I like to see him back in 4 weeks to see how he is doing from a mobility and motion standpoint.  No x-rays are needed.  All question concerns were answered and addressed.

## 2019-08-01 NOTE — Telephone Encounter (Signed)
6 week In office Ortho bundle visit.

## 2019-08-03 DIAGNOSIS — M1712 Unilateral primary osteoarthritis, left knee: Secondary | ICD-10-CM | POA: Diagnosis not present

## 2019-08-03 DIAGNOSIS — M25562 Pain in left knee: Secondary | ICD-10-CM | POA: Diagnosis not present

## 2019-08-03 DIAGNOSIS — M6281 Muscle weakness (generalized): Secondary | ICD-10-CM | POA: Diagnosis not present

## 2019-08-03 DIAGNOSIS — R262 Difficulty in walking, not elsewhere classified: Secondary | ICD-10-CM | POA: Diagnosis not present

## 2019-08-08 DIAGNOSIS — M6281 Muscle weakness (generalized): Secondary | ICD-10-CM | POA: Diagnosis not present

## 2019-08-08 DIAGNOSIS — M1712 Unilateral primary osteoarthritis, left knee: Secondary | ICD-10-CM | POA: Diagnosis not present

## 2019-08-08 DIAGNOSIS — R262 Difficulty in walking, not elsewhere classified: Secondary | ICD-10-CM | POA: Diagnosis not present

## 2019-08-08 DIAGNOSIS — M25562 Pain in left knee: Secondary | ICD-10-CM | POA: Diagnosis not present

## 2019-08-10 DIAGNOSIS — R262 Difficulty in walking, not elsewhere classified: Secondary | ICD-10-CM | POA: Diagnosis not present

## 2019-08-10 DIAGNOSIS — M25562 Pain in left knee: Secondary | ICD-10-CM | POA: Diagnosis not present

## 2019-08-10 DIAGNOSIS — M6281 Muscle weakness (generalized): Secondary | ICD-10-CM | POA: Diagnosis not present

## 2019-08-10 DIAGNOSIS — M1712 Unilateral primary osteoarthritis, left knee: Secondary | ICD-10-CM | POA: Diagnosis not present

## 2019-08-15 DIAGNOSIS — R262 Difficulty in walking, not elsewhere classified: Secondary | ICD-10-CM | POA: Diagnosis not present

## 2019-08-15 DIAGNOSIS — M1712 Unilateral primary osteoarthritis, left knee: Secondary | ICD-10-CM | POA: Diagnosis not present

## 2019-08-15 DIAGNOSIS — M6281 Muscle weakness (generalized): Secondary | ICD-10-CM | POA: Diagnosis not present

## 2019-08-15 DIAGNOSIS — M25562 Pain in left knee: Secondary | ICD-10-CM | POA: Diagnosis not present

## 2019-08-17 DIAGNOSIS — M1712 Unilateral primary osteoarthritis, left knee: Secondary | ICD-10-CM | POA: Diagnosis not present

## 2019-08-17 DIAGNOSIS — M6281 Muscle weakness (generalized): Secondary | ICD-10-CM | POA: Diagnosis not present

## 2019-08-17 DIAGNOSIS — R262 Difficulty in walking, not elsewhere classified: Secondary | ICD-10-CM | POA: Diagnosis not present

## 2019-08-17 DIAGNOSIS — M25562 Pain in left knee: Secondary | ICD-10-CM | POA: Diagnosis not present

## 2019-08-22 DIAGNOSIS — M25562 Pain in left knee: Secondary | ICD-10-CM | POA: Diagnosis not present

## 2019-08-22 DIAGNOSIS — M1712 Unilateral primary osteoarthritis, left knee: Secondary | ICD-10-CM | POA: Diagnosis not present

## 2019-08-22 DIAGNOSIS — R262 Difficulty in walking, not elsewhere classified: Secondary | ICD-10-CM | POA: Diagnosis not present

## 2019-08-22 DIAGNOSIS — M6281 Muscle weakness (generalized): Secondary | ICD-10-CM | POA: Diagnosis not present

## 2019-08-23 DIAGNOSIS — H5213 Myopia, bilateral: Secondary | ICD-10-CM | POA: Diagnosis not present

## 2019-08-23 DIAGNOSIS — H04123 Dry eye syndrome of bilateral lacrimal glands: Secondary | ICD-10-CM | POA: Diagnosis not present

## 2019-08-23 DIAGNOSIS — H25811 Combined forms of age-related cataract, right eye: Secondary | ICD-10-CM | POA: Diagnosis not present

## 2019-08-23 DIAGNOSIS — H524 Presbyopia: Secondary | ICD-10-CM | POA: Diagnosis not present

## 2019-08-23 DIAGNOSIS — H52223 Regular astigmatism, bilateral: Secondary | ICD-10-CM | POA: Diagnosis not present

## 2019-08-23 DIAGNOSIS — H2512 Age-related nuclear cataract, left eye: Secondary | ICD-10-CM | POA: Diagnosis not present

## 2019-08-23 DIAGNOSIS — H43813 Vitreous degeneration, bilateral: Secondary | ICD-10-CM | POA: Diagnosis not present

## 2019-08-24 DIAGNOSIS — M25562 Pain in left knee: Secondary | ICD-10-CM | POA: Diagnosis not present

## 2019-08-24 DIAGNOSIS — M1712 Unilateral primary osteoarthritis, left knee: Secondary | ICD-10-CM | POA: Diagnosis not present

## 2019-08-24 DIAGNOSIS — M6281 Muscle weakness (generalized): Secondary | ICD-10-CM | POA: Diagnosis not present

## 2019-08-24 DIAGNOSIS — R262 Difficulty in walking, not elsewhere classified: Secondary | ICD-10-CM | POA: Diagnosis not present

## 2019-08-29 DIAGNOSIS — M25562 Pain in left knee: Secondary | ICD-10-CM | POA: Diagnosis not present

## 2019-08-29 DIAGNOSIS — M1712 Unilateral primary osteoarthritis, left knee: Secondary | ICD-10-CM | POA: Diagnosis not present

## 2019-08-29 DIAGNOSIS — R262 Difficulty in walking, not elsewhere classified: Secondary | ICD-10-CM | POA: Diagnosis not present

## 2019-08-29 DIAGNOSIS — M6281 Muscle weakness (generalized): Secondary | ICD-10-CM | POA: Diagnosis not present

## 2019-08-31 DIAGNOSIS — R262 Difficulty in walking, not elsewhere classified: Secondary | ICD-10-CM | POA: Diagnosis not present

## 2019-08-31 DIAGNOSIS — M1712 Unilateral primary osteoarthritis, left knee: Secondary | ICD-10-CM | POA: Diagnosis not present

## 2019-08-31 DIAGNOSIS — M6281 Muscle weakness (generalized): Secondary | ICD-10-CM | POA: Diagnosis not present

## 2019-08-31 DIAGNOSIS — M25562 Pain in left knee: Secondary | ICD-10-CM | POA: Diagnosis not present

## 2019-09-04 ENCOUNTER — Other Ambulatory Visit: Payer: Self-pay

## 2019-09-04 ENCOUNTER — Encounter: Payer: Self-pay | Admitting: Orthopaedic Surgery

## 2019-09-04 ENCOUNTER — Ambulatory Visit (INDEPENDENT_AMBULATORY_CARE_PROVIDER_SITE_OTHER): Payer: Medicare Other | Admitting: Orthopaedic Surgery

## 2019-09-04 DIAGNOSIS — Z96652 Presence of left artificial knee joint: Secondary | ICD-10-CM

## 2019-09-04 MED ORDER — CELECOXIB 100 MG PO CAPS: 100.0000 mg | ORAL_CAPSULE | Freq: Two times a day (BID) | ORAL | 3 refills | Status: DC | PRN

## 2019-09-04 NOTE — Progress Notes (Signed)
The patient is now 80 days status post a left total knee arthroplasty.  He said the knee does click some but he knows he has atrophy in that leg.  He says his range of motion therapy is between 115 to 120 degrees flexion.  Overall he feels like he is doing well.  He is an active 73 year old gentleman.  On exam there is still some moderate knee swelling but no significant effusion on the left side.  The knee does have good range of motion.  There is some slight clicking at the patella joint interface but the knee feels ligamentously stable.  His extension is almost full and his flexion is almost full.  He will continue to increase his activities as comfort allows.  I will refill his Celebrex.  From my standpoint, I do not need to see him back for 6 months unless there are any other issues.  At that visit I would like a standing AP and lateral of the left knee.

## 2019-09-07 DIAGNOSIS — M25562 Pain in left knee: Secondary | ICD-10-CM | POA: Diagnosis not present

## 2019-09-07 DIAGNOSIS — R262 Difficulty in walking, not elsewhere classified: Secondary | ICD-10-CM | POA: Diagnosis not present

## 2019-09-07 DIAGNOSIS — M6281 Muscle weakness (generalized): Secondary | ICD-10-CM | POA: Diagnosis not present

## 2019-09-07 DIAGNOSIS — M1712 Unilateral primary osteoarthritis, left knee: Secondary | ICD-10-CM | POA: Diagnosis not present

## 2019-09-12 DIAGNOSIS — M1712 Unilateral primary osteoarthritis, left knee: Secondary | ICD-10-CM | POA: Diagnosis not present

## 2019-09-12 DIAGNOSIS — R262 Difficulty in walking, not elsewhere classified: Secondary | ICD-10-CM | POA: Diagnosis not present

## 2019-09-12 DIAGNOSIS — M25562 Pain in left knee: Secondary | ICD-10-CM | POA: Diagnosis not present

## 2019-09-12 DIAGNOSIS — M6281 Muscle weakness (generalized): Secondary | ICD-10-CM | POA: Diagnosis not present

## 2019-09-13 DIAGNOSIS — K2100 Gastro-esophageal reflux disease with esophagitis, without bleeding: Secondary | ICD-10-CM | POA: Diagnosis not present

## 2019-09-13 DIAGNOSIS — Z85820 Personal history of malignant melanoma of skin: Secondary | ICD-10-CM | POA: Diagnosis not present

## 2019-09-13 DIAGNOSIS — Z125 Encounter for screening for malignant neoplasm of prostate: Secondary | ICD-10-CM | POA: Diagnosis not present

## 2019-09-13 DIAGNOSIS — Z8679 Personal history of other diseases of the circulatory system: Secondary | ICD-10-CM | POA: Diagnosis not present

## 2019-09-13 DIAGNOSIS — I10 Essential (primary) hypertension: Secondary | ICD-10-CM | POA: Diagnosis not present

## 2019-09-13 DIAGNOSIS — F325 Major depressive disorder, single episode, in full remission: Secondary | ICD-10-CM | POA: Diagnosis not present

## 2019-09-13 DIAGNOSIS — E7800 Pure hypercholesterolemia, unspecified: Secondary | ICD-10-CM | POA: Diagnosis not present

## 2019-09-13 DIAGNOSIS — M1180 Other specified crystal arthropathies, unspecified site: Secondary | ICD-10-CM | POA: Diagnosis not present

## 2019-09-13 DIAGNOSIS — M1990 Unspecified osteoarthritis, unspecified site: Secondary | ICD-10-CM | POA: Diagnosis not present

## 2019-09-18 ENCOUNTER — Telehealth: Payer: Self-pay | Admitting: *Deleted

## 2019-09-18 NOTE — Care Plan (Signed)
RNCM attempted call to patient to check status at 90 days post-op. Left VM on home phone number. Was able to reach patient on cell number, but he was at another MD appointment. RNCM will call back tomorrow to discuss how he is doing.

## 2019-09-18 NOTE — Telephone Encounter (Signed)
Attempted 90 day Ortho bundle call. See care plan note.

## 2019-09-20 ENCOUNTER — Telehealth: Payer: Self-pay | Admitting: *Deleted

## 2019-09-20 NOTE — Telephone Encounter (Signed)
90 day Ortho bundle call and survey completed.

## 2019-09-20 NOTE — Care Plan (Signed)
Call to patient to check 90 day post-op after surgery for Left TKA with Dr. Ninfa Linden on 06/16/19. Patient reports he is doing well. Still working to get full flexion. Has been discharged from formal OPPT. Still not at 120 degrees flexion per patient, but he states he is still doing exercises and a variety of things to push to that point. Reviewed 90 day survey, Sydnee Levans. F/U with Dr. Ninfa Linden is in May 2021.

## 2019-10-06 DIAGNOSIS — Z981 Arthrodesis status: Secondary | ICD-10-CM | POA: Diagnosis not present

## 2019-10-06 DIAGNOSIS — M47816 Spondylosis without myelopathy or radiculopathy, lumbar region: Secondary | ICD-10-CM | POA: Diagnosis not present

## 2019-10-06 DIAGNOSIS — M5136 Other intervertebral disc degeneration, lumbar region: Secondary | ICD-10-CM | POA: Diagnosis not present

## 2019-10-06 DIAGNOSIS — M21371 Foot drop, right foot: Secondary | ICD-10-CM | POA: Diagnosis not present

## 2019-11-14 DIAGNOSIS — Z85820 Personal history of malignant melanoma of skin: Secondary | ICD-10-CM | POA: Diagnosis not present

## 2019-11-14 DIAGNOSIS — Z85828 Personal history of other malignant neoplasm of skin: Secondary | ICD-10-CM | POA: Diagnosis not present

## 2019-11-14 DIAGNOSIS — L821 Other seborrheic keratosis: Secondary | ICD-10-CM | POA: Diagnosis not present

## 2019-11-14 DIAGNOSIS — D225 Melanocytic nevi of trunk: Secondary | ICD-10-CM | POA: Diagnosis not present

## 2019-11-14 DIAGNOSIS — L111 Transient acantholytic dermatosis [Grover]: Secondary | ICD-10-CM | POA: Diagnosis not present

## 2019-11-14 DIAGNOSIS — D22121 Melanocytic nevi of left upper eyelid, including canthus: Secondary | ICD-10-CM | POA: Diagnosis not present

## 2019-11-14 DIAGNOSIS — L814 Other melanin hyperpigmentation: Secondary | ICD-10-CM | POA: Diagnosis not present

## 2019-11-24 DIAGNOSIS — I10 Essential (primary) hypertension: Secondary | ICD-10-CM | POA: Diagnosis not present

## 2019-11-24 DIAGNOSIS — E7800 Pure hypercholesterolemia, unspecified: Secondary | ICD-10-CM | POA: Diagnosis not present

## 2019-12-21 DIAGNOSIS — K219 Gastro-esophageal reflux disease without esophagitis: Secondary | ICD-10-CM | POA: Diagnosis not present

## 2019-12-21 DIAGNOSIS — I739 Peripheral vascular disease, unspecified: Secondary | ICD-10-CM | POA: Diagnosis not present

## 2019-12-21 DIAGNOSIS — G609 Hereditary and idiopathic neuropathy, unspecified: Secondary | ICD-10-CM | POA: Diagnosis not present

## 2019-12-21 DIAGNOSIS — M1990 Unspecified osteoarthritis, unspecified site: Secondary | ICD-10-CM | POA: Diagnosis not present

## 2019-12-21 DIAGNOSIS — Z888 Allergy status to other drugs, medicaments and biological substances status: Secondary | ICD-10-CM | POA: Diagnosis not present

## 2019-12-21 DIAGNOSIS — N429 Disorder of prostate, unspecified: Secondary | ICD-10-CM | POA: Diagnosis not present

## 2019-12-21 DIAGNOSIS — I10 Essential (primary) hypertension: Secondary | ICD-10-CM | POA: Diagnosis not present

## 2019-12-21 DIAGNOSIS — R531 Weakness: Secondary | ICD-10-CM | POA: Diagnosis not present

## 2019-12-21 DIAGNOSIS — Z9109 Other allergy status, other than to drugs and biological substances: Secondary | ICD-10-CM | POA: Diagnosis not present

## 2019-12-21 DIAGNOSIS — R799 Abnormal finding of blood chemistry, unspecified: Secondary | ICD-10-CM | POA: Diagnosis not present

## 2019-12-28 ENCOUNTER — Other Ambulatory Visit: Payer: Self-pay | Admitting: Family Medicine

## 2019-12-28 DIAGNOSIS — R52 Pain, unspecified: Secondary | ICD-10-CM

## 2019-12-29 ENCOUNTER — Encounter: Payer: Self-pay | Admitting: Orthopaedic Surgery

## 2020-01-15 DIAGNOSIS — M65322 Trigger finger, left index finger: Secondary | ICD-10-CM | POA: Diagnosis not present

## 2020-01-15 DIAGNOSIS — M79642 Pain in left hand: Secondary | ICD-10-CM | POA: Diagnosis not present

## 2020-01-15 DIAGNOSIS — M65341 Trigger finger, right ring finger: Secondary | ICD-10-CM | POA: Diagnosis not present

## 2020-01-15 DIAGNOSIS — M79641 Pain in right hand: Secondary | ICD-10-CM | POA: Diagnosis not present

## 2020-01-29 DIAGNOSIS — G629 Polyneuropathy, unspecified: Secondary | ICD-10-CM | POA: Diagnosis not present

## 2020-01-29 DIAGNOSIS — G609 Hereditary and idiopathic neuropathy, unspecified: Secondary | ICD-10-CM | POA: Diagnosis not present

## 2020-01-30 DIAGNOSIS — M47816 Spondylosis without myelopathy or radiculopathy, lumbar region: Secondary | ICD-10-CM | POA: Diagnosis not present

## 2020-01-30 DIAGNOSIS — I10 Essential (primary) hypertension: Secondary | ICD-10-CM | POA: Diagnosis not present

## 2020-01-30 DIAGNOSIS — M5136 Other intervertebral disc degeneration, lumbar region: Secondary | ICD-10-CM | POA: Diagnosis not present

## 2020-01-30 DIAGNOSIS — Z981 Arthrodesis status: Secondary | ICD-10-CM | POA: Diagnosis not present

## 2020-02-02 DIAGNOSIS — S00452A Superficial foreign body of left ear, initial encounter: Secondary | ICD-10-CM | POA: Diagnosis not present

## 2020-02-02 DIAGNOSIS — T162XXA Foreign body in left ear, initial encounter: Secondary | ICD-10-CM | POA: Diagnosis not present

## 2020-02-02 DIAGNOSIS — Z974 Presence of external hearing-aid: Secondary | ICD-10-CM | POA: Diagnosis not present

## 2020-02-02 DIAGNOSIS — H6123 Impacted cerumen, bilateral: Secondary | ICD-10-CM | POA: Diagnosis not present

## 2020-02-02 DIAGNOSIS — H903 Sensorineural hearing loss, bilateral: Secondary | ICD-10-CM | POA: Diagnosis not present

## 2020-03-04 ENCOUNTER — Ambulatory Visit (INDEPENDENT_AMBULATORY_CARE_PROVIDER_SITE_OTHER): Payer: Medicare Other

## 2020-03-04 ENCOUNTER — Other Ambulatory Visit: Payer: Self-pay

## 2020-03-04 ENCOUNTER — Encounter: Payer: Self-pay | Admitting: Orthopaedic Surgery

## 2020-03-04 ENCOUNTER — Ambulatory Visit (INDEPENDENT_AMBULATORY_CARE_PROVIDER_SITE_OTHER): Payer: Medicare Other | Admitting: Orthopaedic Surgery

## 2020-03-04 DIAGNOSIS — Z96652 Presence of left artificial knee joint: Secondary | ICD-10-CM

## 2020-03-04 NOTE — Progress Notes (Signed)
Peter [name] is now between 8 and 9 months status post a left total knee arthroplasty.  He had his night knee replaced several years ago.  He says he is not fully back in terms of full flexion of his knee and he does stay stiff around his quad but overall he is made good progress.  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  He walks without an assistive device and no significant limp.  On examination of his most recent left operative knee there is no swelling.  His incisions well-healed.  The knee feels ligamentously stable and has good range of motion.  At this point follow-up can be as needed.  We had a long thorough discussion about his knee.  His x-rays show a well-seated implant with no complicating features.  If he has any issues at all he knows to see [place] at any time.  All questions and concerns were answered and addressed.

## 2020-03-08 DIAGNOSIS — K2100 Gastro-esophageal reflux disease with esophagitis, without bleeding: Secondary | ICD-10-CM | POA: Diagnosis not present

## 2020-03-08 DIAGNOSIS — N529 Male erectile dysfunction, unspecified: Secondary | ICD-10-CM | POA: Diagnosis not present

## 2020-03-08 DIAGNOSIS — E7800 Pure hypercholesterolemia, unspecified: Secondary | ICD-10-CM | POA: Diagnosis not present

## 2020-03-08 DIAGNOSIS — Z0184 Encounter for antibody response examination: Secondary | ICD-10-CM | POA: Diagnosis not present

## 2020-03-08 DIAGNOSIS — F325 Major depressive disorder, single episode, in full remission: Secondary | ICD-10-CM | POA: Diagnosis not present

## 2020-03-08 DIAGNOSIS — M1180 Other specified crystal arthropathies, unspecified site: Secondary | ICD-10-CM | POA: Diagnosis not present

## 2020-03-08 DIAGNOSIS — Z0000 Encounter for general adult medical examination without abnormal findings: Secondary | ICD-10-CM | POA: Diagnosis not present

## 2020-03-08 DIAGNOSIS — Z85820 Personal history of malignant melanoma of skin: Secondary | ICD-10-CM | POA: Diagnosis not present

## 2020-03-08 DIAGNOSIS — R7301 Impaired fasting glucose: Secondary | ICD-10-CM | POA: Diagnosis not present

## 2020-03-08 DIAGNOSIS — M1990 Unspecified osteoarthritis, unspecified site: Secondary | ICD-10-CM | POA: Diagnosis not present

## 2020-03-08 DIAGNOSIS — Z8679 Personal history of other diseases of the circulatory system: Secondary | ICD-10-CM | POA: Diagnosis not present

## 2020-03-08 DIAGNOSIS — I10 Essential (primary) hypertension: Secondary | ICD-10-CM | POA: Diagnosis not present

## 2020-04-18 DIAGNOSIS — Z1211 Encounter for screening for malignant neoplasm of colon: Secondary | ICD-10-CM | POA: Diagnosis not present

## 2020-04-18 LAB — HM COLONOSCOPY

## 2020-05-14 DIAGNOSIS — L821 Other seborrheic keratosis: Secondary | ICD-10-CM | POA: Diagnosis not present

## 2020-05-14 DIAGNOSIS — L820 Inflamed seborrheic keratosis: Secondary | ICD-10-CM | POA: Diagnosis not present

## 2020-05-14 DIAGNOSIS — L570 Actinic keratosis: Secondary | ICD-10-CM | POA: Diagnosis not present

## 2020-05-14 DIAGNOSIS — Z85828 Personal history of other malignant neoplasm of skin: Secondary | ICD-10-CM | POA: Diagnosis not present

## 2020-05-14 DIAGNOSIS — D2272 Melanocytic nevi of left lower limb, including hip: Secondary | ICD-10-CM | POA: Diagnosis not present

## 2020-05-14 DIAGNOSIS — L111 Transient acantholytic dermatosis [Grover]: Secondary | ICD-10-CM | POA: Diagnosis not present

## 2020-05-14 DIAGNOSIS — Z85820 Personal history of malignant melanoma of skin: Secondary | ICD-10-CM | POA: Diagnosis not present

## 2020-06-12 DIAGNOSIS — Z23 Encounter for immunization: Secondary | ICD-10-CM | POA: Diagnosis not present

## 2020-06-19 ENCOUNTER — Telehealth: Payer: Self-pay | Admitting: *Deleted

## 2020-06-19 NOTE — Telephone Encounter (Signed)
Ortho bundle 1 year call completed. ?

## 2020-07-16 DIAGNOSIS — J069 Acute upper respiratory infection, unspecified: Secondary | ICD-10-CM | POA: Diagnosis not present

## 2020-07-24 DIAGNOSIS — Z23 Encounter for immunization: Secondary | ICD-10-CM | POA: Diagnosis not present

## 2020-08-02 DIAGNOSIS — Z981 Arthrodesis status: Secondary | ICD-10-CM | POA: Diagnosis not present

## 2020-08-08 DIAGNOSIS — S00452D Superficial foreign body of left ear, subsequent encounter: Secondary | ICD-10-CM | POA: Diagnosis not present

## 2020-08-08 DIAGNOSIS — H6123 Impacted cerumen, bilateral: Secondary | ICD-10-CM | POA: Diagnosis not present

## 2020-08-08 DIAGNOSIS — T162XXD Foreign body in left ear, subsequent encounter: Secondary | ICD-10-CM | POA: Diagnosis not present

## 2020-08-08 DIAGNOSIS — H903 Sensorineural hearing loss, bilateral: Secondary | ICD-10-CM | POA: Diagnosis not present

## 2020-08-26 DIAGNOSIS — G609 Hereditary and idiopathic neuropathy, unspecified: Secondary | ICD-10-CM | POA: Diagnosis not present

## 2020-08-27 DIAGNOSIS — H5319 Other subjective visual disturbances: Secondary | ICD-10-CM | POA: Diagnosis not present

## 2020-08-27 DIAGNOSIS — H25811 Combined forms of age-related cataract, right eye: Secondary | ICD-10-CM | POA: Diagnosis not present

## 2020-08-27 DIAGNOSIS — H52223 Regular astigmatism, bilateral: Secondary | ICD-10-CM | POA: Diagnosis not present

## 2020-08-27 DIAGNOSIS — H43813 Vitreous degeneration, bilateral: Secondary | ICD-10-CM | POA: Diagnosis not present

## 2020-08-27 DIAGNOSIS — H04123 Dry eye syndrome of bilateral lacrimal glands: Secondary | ICD-10-CM | POA: Diagnosis not present

## 2020-08-27 DIAGNOSIS — H524 Presbyopia: Secondary | ICD-10-CM | POA: Diagnosis not present

## 2020-08-27 DIAGNOSIS — H2512 Age-related nuclear cataract, left eye: Secondary | ICD-10-CM | POA: Diagnosis not present

## 2020-08-27 DIAGNOSIS — H5213 Myopia, bilateral: Secondary | ICD-10-CM | POA: Diagnosis not present

## 2020-09-04 ENCOUNTER — Encounter: Payer: Self-pay | Admitting: Orthopaedic Surgery

## 2020-09-04 ENCOUNTER — Other Ambulatory Visit: Payer: Self-pay

## 2020-09-04 ENCOUNTER — Ambulatory Visit (INDEPENDENT_AMBULATORY_CARE_PROVIDER_SITE_OTHER): Payer: Medicare Other | Admitting: Orthopaedic Surgery

## 2020-09-04 ENCOUNTER — Ambulatory Visit (INDEPENDENT_AMBULATORY_CARE_PROVIDER_SITE_OTHER): Payer: Medicare Other

## 2020-09-04 DIAGNOSIS — M79671 Pain in right foot: Secondary | ICD-10-CM | POA: Insufficient documentation

## 2020-09-04 MED ORDER — LIDOCAINE HCL 1 % IJ SOLN
0.5000 mL | INTRAMUSCULAR | Status: AC | PRN
  Administered 2020-09-04: .5 mL

## 2020-09-04 NOTE — Progress Notes (Signed)
Office Visit Note   Patient: Peter CHAPPLE, MD           Date of Birth: Jul 15, 1946           MRN: [id number] Visit Date: 09/04/2020              Requested by: [name], MD [address] [place],  [place] [address] PCP: [name], MD   Assessment & Plan: Visit Diagnoses:  1. Pain in right foot     Plan: Acute onset of pain at the base of the fifth metatarsal at the base of the third cuneiform fifth metatarsal joint.  This may be CPPD.  Could be a stress fracture but I doubt it.  Will inject with cortisone and monitor response Follow-Up Instructions: Return if symptoms worsen or fail to improve.   Orders:  Orders Placed This Encounter  Procedures  . Small Joint Inj: R intertarsal  . XR Foot Complete Right   No orders of the defined types were placed in this encounter.     Procedures: Small Joint Inj: R intertarsal on 09/04/2020 5:06 PM Details: 27 G needle, dorsal approach Medications: 0.5 mL lidocaine 1 %  Half mL betamethasone      Clinical Data: No additional findings.   Subjective: Chief Complaint  Patient presents with  . Right Foot - Pain  Patient presents today with right foot pain. He states that he dressed up last night for a show and put on shoes that were not very comfortable. No injury took place last night. He woke last night to use the restroom and was unable to bear weight on his right foot. His pain is located laterally. No swelling or bruising. He takes Celebrex daily.  Has history of CPPD.  No active treatment. Spent a lot of time on his feet walking [place] the beach but without history of injury or trauma  HPI  Review of Systems   Objective: Vital Signs: There were no vitals taken for this visit.  Physical Exam Constitutional:      Appearance: He is well-developed.  Eyes:     Pupils: Pupils are equal, round, and reactive to light.  Pulmonary:     Effort: Pulmonary effort is normal.  Skin:    General: Skin is  warm and dry.  Neurological:     Mental Status: He is alert and oriented to person, place, and time.  Psychiatric:        Behavior: Behavior normal.     Ortho Exam foot with tenderness dorsally at the third cuneiforms base of fifth metatarsal.  Mild swelling.  No redness.  No pain over the peroneus brevis attachment at the base of the fifth metatarsal.  Does have altered sensibility related to neuropathy  Specialty Comments:  No specialty comments available.  Imaging: XR Foot Complete Right  Result Date: 09/04/2020 Films of the right foot were obtained in several projections.  Pain is localized at the base of the fifth metatarsal and more dorsally at the cuneiform fifth metatarsal articulation.  No abnormality identified patient has history of CPPD but no calcification noted    PMFS History: Patient Active Problem List   Diagnosis Date Noted  . Pain in right foot 09/04/2020  . Status post total left knee replacement 06/16/2019  . Unilateral primary osteoarthritis, left knee 04/20/2019  . HNP (herniated nucleus pulposus), cervical 05/18/2018  . Arthrodesis status 05/08/2016  . Polyneuropathy 05/08/2016  . Other intervertebral disc degeneration, lumbar region 05/08/2016  .  Spinal stenosis of lumbar region 05/08/2016  . Anesthesia of skin 05/08/2016  . OA (osteoarthritis) of knee 10/07/2015  . Encounter for immunization 09/03/2015  . Lesion of skeletal muscle 04/22/2015  . HLD (hyperlipidemia) 02/15/2014  . Lumbar stenosis with neurogenic claudication 10/02/2013  . Lumbosacral spinal stenosis 06/09/2013  . Lumbar spondylosis with myelopathy 06/09/2013  . Tear film insufficiency, unspecified 06/05/2013  . Thoracic or lumbosacral neuritis or radiculitis, unspecified 06/05/2013  . Inflammation of shoulder joint 10/09/2011  . History of repair of left rotator cuff 10/09/2011  . Need for diphtheria-tetanus-pertussis (Tdap) vaccine, adult/adolescent 04/13/2011  . Chest pain   .  Dizziness   . Physical exam, annual 03/26/2011  . Osteoarthritis 06/21/2010  . Pseudogout involving multiple joints 06/20/2010  . HIP PAIN 06/20/2010  . HYPERCHOLESTEROLEMIA, BORDERLINE 07/19/2008  . TINNITUS, CHRONIC 07/19/2008  . RHEUMATIC FEVER 07/19/2008  . Allergic rhinitis 07/19/2008  . GERD 07/19/2008  . SPONDYLOSIS 07/19/2008  . COLONIC POLYPS, HX OF 07/19/2008  . Melanoma of skin ([place]) 01/11/2008  . Essential hypertension 01/11/2008   Past Medical History:  Diagnosis Date  . Allergic rhinitis   . Anxiety   . Cancer [facility]    Melanoma -peri umbilical '02 or '03- no further problems. 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   . Chest pain    Nuclear 2005, normal / coronary CTA 2008, slight mixed plaque, coronary calcium score 0.65, ejection fraction 55%, echo, March, 2008  . Complication of anesthesia    itching-not sure if oxycodone or anesthesia,and shakes. Right TMJ issue  chills in past  . Dizziness    Dizziness with question of presyncope April 08, 2011  . DJD (degenerative joint disease)    history DDD, fingers, knees, shoulders  . Ejection fraction    EF 55%, echo, March, 2008.  . Family history of Alzheimer's disease   . GERD (gastroesophageal reflux disease)   . Hearing impaired    bilateral hearing aids  . Heart murmur    74 yrs old rheumatic fever   . Heart murmur 2008   trivial tricusp regurg  . History of colonic polyps   . History of melanoma   . History of pseudogout   . History of recent steroid use    10-10-15 tapering steroid use for tx. recent bronchitis.  Marland Kitchen History of rheumatic fever   . HNP (herniated nucleus pulposus), cervical   . Hypercholesteremia    borderline  . Hypertension   . Lumbar spondylosis with myelopathy 06/09/2013  . Spondylosis   . Tinnitus    chronic  . TMJ click    right"was aggravated with last intubation"    Family History  Problem Relation Age of Onset  . Alzheimer's disease Father   . Alzheimer's disease Mother      Past Surgical History:  Procedure Laterality Date  . ANTERIOR CERVICAL DECOMP/DISCECTOMY FUSION  2003  . ANTERIOR CERVICAL DECOMP/DISCECTOMY FUSION N/A 05/18/2018   Procedure: ANTERIOR CERVICAL DECOMPRESSION/DISCECTOMY FUSION - CERVICAL FIVE-CERVICAL SIX, removal of tether plate;  Surgeon: Jovita Gamma, MD;  Location: [facility];  Service: Neurosurgery;  Laterality: N/A;  . BACK SURGERY     x3  . CARPAL TUNNEL RELEASE Right 10/31/2013   Procedure: RIGHT CARPAL TUNNEL RELEASE;  Surgeon: Cammie Sickle., MD;  Location: [facility];  Service: Orthopedics;  Laterality: Right;  . CARPAL TUNNEL RELEASE Left 11/28/2013   Procedure: LEFT CARPAL TUNNEL RELEASE;  Surgeon: Cammie Sickle., MD;  Location: [facility];  Service: Orthopedics;  Laterality: Left;  . CERVICAL DISCECTOMY     anterior with patellar allograft and plating  . JOINT REPLACEMENT     right knee 3 years ago  . LUMBAR FUSION  12/14  . MELANOMA EXCISION WITH SENTINEL LYMPH NODE BIOPSY  8/02   bilat ing node bx  . ROTATOR CUFF REPAIR Left    08/13/11  08/27/2011  . TOTAL KNEE ARTHROPLASTY Right 10/07/2015   Procedure: RIGHT TOTAL KNEE ARTHROPLASTY, with synovial tissue specimen;  Surgeon: [name], MD;  Location: [facility];  Service: Orthopedics;  Laterality: Right;  . TOTAL KNEE ARTHROPLASTY Left 06/16/2019   Procedure: LEFT TOTAL KNEE ARTHROPLASTY;  Surgeon: Mcarthur Rossetti, MD;  Location: [facility];  Service: Orthopedics;  Laterality: Left;   Social History   Occupational History  . Occupation: RADIOLOGIST    Employer: [employer]  Tobacco Use  . Smoking status: Never Smoker  . Smokeless tobacco: Never Used  Vaping Use  . Vaping Use: Never used  Substance and Sexual Activity  . Alcohol use: Yes    Alcohol/week: 2.0 - 4.0 standard drinks    Types: 2 - 4 Glasses of wine per week    Comment: 3 x a week  . Drug use: No  . Sexual activity: Yes

## 2020-09-09 DIAGNOSIS — E7800 Pure hypercholesterolemia, unspecified: Secondary | ICD-10-CM | POA: Diagnosis not present

## 2020-09-09 DIAGNOSIS — I10 Essential (primary) hypertension: Secondary | ICD-10-CM | POA: Diagnosis not present

## 2020-09-09 DIAGNOSIS — G629 Polyneuropathy, unspecified: Secondary | ICD-10-CM | POA: Diagnosis not present

## 2020-09-09 DIAGNOSIS — N529 Male erectile dysfunction, unspecified: Secondary | ICD-10-CM | POA: Diagnosis not present

## 2020-09-09 DIAGNOSIS — M1180 Other specified crystal arthropathies, unspecified site: Secondary | ICD-10-CM | POA: Diagnosis not present

## 2020-09-09 DIAGNOSIS — Z85820 Personal history of malignant melanoma of skin: Secondary | ICD-10-CM | POA: Diagnosis not present

## 2020-09-09 DIAGNOSIS — M1990 Unspecified osteoarthritis, unspecified site: Secondary | ICD-10-CM | POA: Diagnosis not present

## 2020-09-09 DIAGNOSIS — Z8679 Personal history of other diseases of the circulatory system: Secondary | ICD-10-CM | POA: Diagnosis not present

## 2020-09-09 DIAGNOSIS — R7301 Impaired fasting glucose: Secondary | ICD-10-CM | POA: Diagnosis not present

## 2020-09-09 DIAGNOSIS — K2100 Gastro-esophageal reflux disease with esophagitis, without bleeding: Secondary | ICD-10-CM | POA: Diagnosis not present

## 2020-09-09 DIAGNOSIS — F325 Major depressive disorder, single episode, in full remission: Secondary | ICD-10-CM | POA: Diagnosis not present

## 2020-09-12 DIAGNOSIS — L111 Transient acantholytic dermatosis [Grover]: Secondary | ICD-10-CM | POA: Diagnosis not present

## 2020-09-12 DIAGNOSIS — Z85820 Personal history of malignant melanoma of skin: Secondary | ICD-10-CM | POA: Diagnosis not present

## 2020-09-12 DIAGNOSIS — Z85828 Personal history of other malignant neoplasm of skin: Secondary | ICD-10-CM | POA: Diagnosis not present

## 2020-09-12 DIAGNOSIS — L923 Foreign body granuloma of the skin and subcutaneous tissue: Secondary | ICD-10-CM | POA: Diagnosis not present

## 2020-09-16 ENCOUNTER — Other Ambulatory Visit: Payer: Self-pay

## 2020-09-16 ENCOUNTER — Ambulatory Visit (INDEPENDENT_AMBULATORY_CARE_PROVIDER_SITE_OTHER): Payer: Medicare Other | Admitting: Orthopaedic Surgery

## 2020-09-16 ENCOUNTER — Encounter: Payer: Self-pay | Admitting: Orthopaedic Surgery

## 2020-09-16 DIAGNOSIS — M6020 Foreign body granuloma of soft tissue, not elsewhere classified, unspecified site: Secondary | ICD-10-CM | POA: Diagnosis not present

## 2020-09-16 NOTE — Progress Notes (Signed)
Office Visit Note   Patient: Peter HAZEL, MD           Date of Birth: July 28, 1946           MRN: [id number] Visit Date: 09/16/2020              Requested by: [name], MD [address] [place],  [place] [address] PCP: [name], MD   Assessment & Plan: Visit Diagnoses:  1. Foreign body reaction     Plan: Within the past week [name] . a small cystic-appearing lesion at the superior aspect of an old left shoulder surgical scar.  He had a rotator cuff tear repair approximately 9 years ago.  He had a reaction to the nonabsorbable stitches and the second surgery was necessary to debride the wound, remove any foreign body and then closed the rotator cuff with wire.  He has had little separation of the incision but really not had any problem until the last week or 2.  It appears that he has had a foreign body reaction and dust the reason for I&D.  After I&D there was some fibrous tissue that could have encased a small foreign body also some gritty material possibly consistent with CPPD.  The wound was cleaned.  I used 1% Xylocaine with epinephrine.  I used the Steri-Strips over benzoin and a waterproof Band-Aid.  No culture or specimen sent  Follow-Up Instructions: Return if symptoms worsen or fail to improve.   Orders:  Orders Placed This Encounter  Procedures  . Incision & Drainage   No orders of the defined types were placed in this encounter.     Procedures: Incision & Drainage  Date/Time: 09/16/2020 2:05 PM Performed by: Garald Balding, MD Authorized by: Garald Balding, MD   Consent:    Consent obtained:  Verbal   Consent given by:  Patient   Alternatives discussed:  No treatment Location:    Type:  Seroma   Location:  Upper extremity   Upper extremity location:  Shoulder   Shoulder location:  L shoulder Pre-procedure details:    Skin preparation:  Alcohol and Betadine Anesthesia (see MAR for exact dosages):    Anesthesia method:   Local infiltration   Local anesthetic:  Lidocaine 1% WITH epi Procedure type:    Complexity:  Simple Procedure details:    Needle aspiration: no     Incision types:  Stab incision   Incision depth:  Subcutaneous   Scalpel blade:  15   Wound management:  Debrided   Drainage:  Serous   Drainage amount:  Scant Post-procedure details:    Patient tolerance of procedure:  Tolerated well, no immediate complications Comments:     2-3's millimeter incision closed with Steri-Strips over benzoin and a waterproof Band-Aid.  Removed from fibrous tissue that could have encased an old suture.  Also some crystalline material possibly consistent with his history of CPPD.  Wound was cleaned with Betadine and then closed with a single Steri-Strip     Clinical Data: No additional findings.   Subjective: Chief Complaint  Patient presents with  . Left Shoulder - Pain  Patient presents today for his left shoulder. He said that he has knot on the top of his shoulder. He had surgery on that shoulder about 9years ago and thinks that he may have stitch trying to resurface.   HPI  Review of Systems   Objective: Vital Signs: Ht 5\' 9"  (1.753 m)  Wt 169 lb (76.7 kg)   BMI 24.96 kg/m   Physical Exam Constitutional:      Appearance: He is well-developed.  Eyes:     Pupils: Pupils are equal, round, and reactive to light.  Pulmonary:     Effort: Pulmonary effort is normal.  Skin:    General: Skin is warm and dry.  Neurological:     Mental Status: He is alert and oriented to person, place, and time.  Psychiatric:        Behavior: Behavior normal.     Ortho Exam left shoulder with about a 3 or 4 mm cystic lesion at the very superior aspect of an old surgical scar.  It was fluctuant.  It was not tender 70 gait days ago was a little more red and was more pink today.  No pain with range of motion of the shoulder.  Skin  otherwise intact  Specialty Comments:  No specialty comments  available.  Imaging: No results found.   PMFS History: Patient Active Problem List   Diagnosis Date Noted  . Foreign body reaction 09/16/2020  . Pain in right foot 09/04/2020  . Status post total left knee replacement 06/16/2019  . Unilateral primary osteoarthritis, left knee 04/20/2019  . HNP (herniated nucleus pulposus), cervical 05/18/2018  . Arthrodesis status 05/08/2016  . Polyneuropathy 05/08/2016  . Other intervertebral disc degeneration, lumbar region 05/08/2016  . Spinal stenosis of lumbar region 05/08/2016  . Anesthesia of skin 05/08/2016  . OA (osteoarthritis) of knee 10/07/2015  . Encounter for immunization 09/03/2015  . Lesion of skeletal muscle 04/22/2015  . HLD (hyperlipidemia) 02/15/2014  . Lumbar stenosis with neurogenic claudication 10/02/2013  . Lumbosacral spinal stenosis 06/09/2013  . Lumbar spondylosis with myelopathy 06/09/2013  . Tear film insufficiency, unspecified 06/05/2013  . Thoracic or lumbosacral neuritis or radiculitis, unspecified 06/05/2013  . Inflammation of shoulder joint 10/09/2011  . History of repair of left rotator cuff 10/09/2011  . Need for diphtheria-tetanus-pertussis (Tdap) vaccine, adult/adolescent 04/13/2011  . Chest pain   . Dizziness   . Physical exam, annual 03/26/2011  . Osteoarthritis 06/21/2010  . Pseudogout involving multiple joints 06/20/2010  . HIP PAIN 06/20/2010  . HYPERCHOLESTEROLEMIA, BORDERLINE 07/19/2008  . TINNITUS, CHRONIC 07/19/2008  . RHEUMATIC FEVER 07/19/2008  . Allergic rhinitis 07/19/2008  . GERD 07/19/2008  . SPONDYLOSIS 07/19/2008  . COLONIC POLYPS, HX OF 07/19/2008  . Melanoma of skin ([facility]) 01/11/2008  . Essential hypertension 01/11/2008   Past Medical History:  Diagnosis Date  . Allergic rhinitis   . Anxiety   . Cancer [facility])    Melanoma -peri umbilical '02 or '03- no further problems. 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   . Chest pain    Nuclear 2005, normal / coronary CTA 2008, slight  mixed plaque, coronary calcium score 0.65, ejection fraction 55%, echo, March, 2008  . Complication of anesthesia    itching-not sure if oxycodone or anesthesia,and shakes. Right TMJ issue  chills in past  . Dizziness    Dizziness with question of presyncope April 08, 2011  . DJD (degenerative joint disease)    history DDD, fingers, knees, shoulders  . Ejection fraction    EF 55%, echo, March, 2008.  . Family history of Alzheimer's disease   . GERD (gastroesophageal reflux disease)   . Hearing impaired    bilateral hearing aids  . Heart murmur    74 yrs old rheumatic fever   . Heart murmur 2008   trivial tricusp regurg  .  History of colonic polyps   . History of melanoma   . History of pseudogout   . History of recent steroid use    10-10-15 tapering steroid use for tx. recent bronchitis.  Marland Kitchen History of rheumatic fever   . HNP (herniated nucleus pulposus), cervical   . Hypercholesteremia    borderline  . Hypertension   . Lumbar spondylosis with myelopathy 06/09/2013  . Spondylosis   . Tinnitus    chronic  . TMJ click    right"was aggravated with last intubation"    Family History  Problem Relation Age of Onset  . Alzheimer's disease Father   . Alzheimer's disease Mother     Past Surgical History:  Procedure Laterality Date  . ANTERIOR CERVICAL DECOMP/DISCECTOMY FUSION  2003  . ANTERIOR CERVICAL DECOMP/DISCECTOMY FUSION N/A 05/18/2018   Procedure: ANTERIOR CERVICAL DECOMPRESSION/DISCECTOMY FUSION - CERVICAL FIVE-CERVICAL SIX, removal of tether plate;  Surgeon: Jovita Gamma, MD;  Location: [facility];  Service: Neurosurgery;  Laterality: N/A;  . BACK SURGERY     x3  . CARPAL TUNNEL RELEASE Right 10/31/2013   Procedure: RIGHT CARPAL TUNNEL RELEASE;  Surgeon: Cammie Sickle., MD;  Location: [facility];  Service: Orthopedics;  Laterality: Right;  . CARPAL TUNNEL RELEASE Left 11/28/2013   Procedure: LEFT CARPAL TUNNEL RELEASE;  Surgeon: Cammie Sickle., MD;   Location: [facility];  Service: Orthopedics;  Laterality: Left;  . CERVICAL DISCECTOMY     anterior with patellar allograft and plating  . JOINT REPLACEMENT     right knee 3 years ago  . LUMBAR FUSION  12/14  . MELANOMA EXCISION WITH SENTINEL LYMPH NODE BIOPSY  8/02   bilat ing node bx  . ROTATOR CUFF REPAIR Left    08/13/11  08/27/2011  . TOTAL KNEE ARTHROPLASTY Right 10/07/2015   Procedure: RIGHT TOTAL KNEE ARTHROPLASTY, with synovial tissue specimen;  Surgeon: [name], MD;  Location: [facility];  Service: Orthopedics;  Laterality: Right;  . TOTAL KNEE ARTHROPLASTY Left 06/16/2019   Procedure: LEFT TOTAL KNEE ARTHROPLASTY;  Surgeon: Mcarthur Rossetti, MD;  Location: [facility];  Service: Orthopedics;  Laterality: Left;   Social History   Occupational History  . Occupation: RADIOLOGIST    Employer: [employer]  Tobacco Use  . Smoking status: Never Smoker  . Smokeless tobacco: Never Used  Vaping Use  . Vaping Use: Never used  Substance and Sexual Activity  . Alcohol use: Yes    Alcohol/week: 2.0 - 4.0 standard drinks    Types: 2 - 4 Glasses of wine per week    Comment: 3 x a week  . Drug use: No  . Sexual activity: Yes

## 2020-10-02 DIAGNOSIS — M65341 Trigger finger, right ring finger: Secondary | ICD-10-CM | POA: Diagnosis not present

## 2020-10-02 DIAGNOSIS — M65322 Trigger finger, left index finger: Secondary | ICD-10-CM | POA: Diagnosis not present

## 2020-11-25 DIAGNOSIS — M25552 Pain in left hip: Secondary | ICD-10-CM | POA: Diagnosis not present

## 2020-11-26 ENCOUNTER — Other Ambulatory Visit: Payer: Self-pay

## 2020-11-26 DIAGNOSIS — M25552 Pain in left hip: Secondary | ICD-10-CM

## 2020-11-29 ENCOUNTER — Other Ambulatory Visit: Payer: Medicare Other

## 2020-12-09 DIAGNOSIS — L111 Transient acantholytic dermatosis [Grover]: Secondary | ICD-10-CM | POA: Diagnosis not present

## 2020-12-09 DIAGNOSIS — L821 Other seborrheic keratosis: Secondary | ICD-10-CM | POA: Diagnosis not present

## 2020-12-09 DIAGNOSIS — L0889 Other specified local infections of the skin and subcutaneous tissue: Secondary | ICD-10-CM | POA: Diagnosis not present

## 2020-12-09 DIAGNOSIS — Z85820 Personal history of malignant melanoma of skin: Secondary | ICD-10-CM | POA: Diagnosis not present

## 2020-12-09 DIAGNOSIS — L080 Pyoderma: Secondary | ICD-10-CM | POA: Diagnosis not present

## 2020-12-09 DIAGNOSIS — L820 Inflamed seborrheic keratosis: Secondary | ICD-10-CM | POA: Diagnosis not present

## 2020-12-09 DIAGNOSIS — Z85828 Personal history of other malignant neoplasm of skin: Secondary | ICD-10-CM | POA: Diagnosis not present

## 2020-12-31 DIAGNOSIS — M545 Low back pain, unspecified: Secondary | ICD-10-CM | POA: Diagnosis not present

## 2020-12-31 DIAGNOSIS — M5136 Other intervertebral disc degeneration, lumbar region: Secondary | ICD-10-CM | POA: Diagnosis not present

## 2021-01-07 DIAGNOSIS — M545 Low back pain, unspecified: Secondary | ICD-10-CM | POA: Diagnosis not present

## 2021-01-07 DIAGNOSIS — M5136 Other intervertebral disc degeneration, lumbar region: Secondary | ICD-10-CM | POA: Diagnosis not present

## 2021-01-16 DIAGNOSIS — M545 Low back pain, unspecified: Secondary | ICD-10-CM | POA: Diagnosis not present

## 2021-01-16 DIAGNOSIS — M5136 Other intervertebral disc degeneration, lumbar region: Secondary | ICD-10-CM | POA: Diagnosis not present

## 2021-01-22 DIAGNOSIS — Z23 Encounter for immunization: Secondary | ICD-10-CM | POA: Diagnosis not present

## 2021-01-23 DIAGNOSIS — M545 Low back pain, unspecified: Secondary | ICD-10-CM | POA: Diagnosis not present

## 2021-01-23 DIAGNOSIS — M5136 Other intervertebral disc degeneration, lumbar region: Secondary | ICD-10-CM | POA: Diagnosis not present

## 2021-01-29 DIAGNOSIS — Z85820 Personal history of malignant melanoma of skin: Secondary | ICD-10-CM | POA: Insufficient documentation

## 2021-01-30 DIAGNOSIS — M545 Low back pain, unspecified: Secondary | ICD-10-CM | POA: Diagnosis not present

## 2021-01-30 DIAGNOSIS — D492 Neoplasm of unspecified behavior of bone, soft tissue, and skin: Secondary | ICD-10-CM | POA: Diagnosis not present

## 2021-01-30 DIAGNOSIS — L578 Other skin changes due to chronic exposure to nonionizing radiation: Secondary | ICD-10-CM | POA: Diagnosis not present

## 2021-01-30 DIAGNOSIS — M5136 Other intervertebral disc degeneration, lumbar region: Secondary | ICD-10-CM | POA: Diagnosis not present

## 2021-01-30 DIAGNOSIS — L111 Transient acantholytic dermatosis [Grover]: Secondary | ICD-10-CM | POA: Diagnosis not present

## 2021-01-30 DIAGNOSIS — D229 Melanocytic nevi, unspecified: Secondary | ICD-10-CM | POA: Diagnosis not present

## 2021-01-30 DIAGNOSIS — L814 Other melanin hyperpigmentation: Secondary | ICD-10-CM | POA: Diagnosis not present

## 2021-01-30 DIAGNOSIS — L858 Other specified epidermal thickening: Secondary | ICD-10-CM | POA: Diagnosis not present

## 2021-01-30 DIAGNOSIS — Z85820 Personal history of malignant melanoma of skin: Secondary | ICD-10-CM | POA: Diagnosis not present

## 2021-01-31 DIAGNOSIS — H6123 Impacted cerumen, bilateral: Secondary | ICD-10-CM | POA: Diagnosis not present

## 2021-01-31 DIAGNOSIS — H903 Sensorineural hearing loss, bilateral: Secondary | ICD-10-CM | POA: Diagnosis not present

## 2021-01-31 DIAGNOSIS — M1990 Unspecified osteoarthritis, unspecified site: Secondary | ICD-10-CM | POA: Diagnosis not present

## 2021-01-31 DIAGNOSIS — M1120 Other chondrocalcinosis, unspecified site: Secondary | ICD-10-CM | POA: Diagnosis not present

## 2021-02-05 ENCOUNTER — Other Ambulatory Visit (HOSPITAL_COMMUNITY): Payer: Self-pay | Admitting: *Deleted

## 2021-02-10 DIAGNOSIS — M5136 Other intervertebral disc degeneration, lumbar region: Secondary | ICD-10-CM | POA: Diagnosis not present

## 2021-02-10 DIAGNOSIS — M545 Low back pain, unspecified: Secondary | ICD-10-CM | POA: Diagnosis not present

## 2021-02-17 ENCOUNTER — Other Ambulatory Visit: Payer: Self-pay

## 2021-02-17 ENCOUNTER — Ambulatory Visit (HOSPITAL_BASED_OUTPATIENT_CLINIC_OR_DEPARTMENT_OTHER)
Admission: RE | Admit: 2021-02-17 | Discharge: 2021-02-17 | Disposition: A | Discharge status: Home / Self Care | Payer: Medicare Other | Source: Ambulatory Visit | Attending: Cardiology | Admitting: Cardiology

## 2021-02-18 ENCOUNTER — Telehealth: Payer: Self-pay | Admitting: Cardiovascular Disease

## 2021-02-18 ENCOUNTER — Telehealth: Payer: Self-pay

## 2021-02-18 DIAGNOSIS — R931 Abnormal findings on diagnostic imaging of heart and coronary circulation: Secondary | ICD-10-CM

## 2021-02-18 DIAGNOSIS — I2510 Atherosclerotic heart disease of native coronary artery without angina pectoris: Secondary | ICD-10-CM

## 2021-02-18 DIAGNOSIS — R9439 Abnormal result of other cardiovascular function study: Secondary | ICD-10-CM

## 2021-02-18 DIAGNOSIS — R9430 Abnormal result of cardiovascular function study, unspecified: Secondary | ICD-10-CM

## 2021-02-18 NOTE — Telephone Encounter (Signed)
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

## 2021-02-18 NOTE — Telephone Encounter (Signed)
Spoke with the patient and advised him that orders have been placed for an exercise treadmill test. Patient aware that our schedulers will be reaching out to get him set up.

## 2021-02-18 NOTE — Telephone Encounter (Signed)
Dr [name] is asking that Dr Johnsie Cancel giving him a call

## 2021-02-20 DIAGNOSIS — M545 Low back pain, unspecified: Secondary | ICD-10-CM | POA: Diagnosis not present

## 2021-02-20 DIAGNOSIS — M5136 Other intervertebral disc degeneration, lumbar region: Secondary | ICD-10-CM | POA: Diagnosis not present

## 2021-02-25 ENCOUNTER — Other Ambulatory Visit: Payer: Self-pay

## 2021-02-25 ENCOUNTER — Ambulatory Visit (INDEPENDENT_AMBULATORY_CARE_PROVIDER_SITE_OTHER): Payer: Medicare Other

## 2021-02-25 DIAGNOSIS — R931 Abnormal findings on diagnostic imaging of heart and coronary circulation: Secondary | ICD-10-CM | POA: Diagnosis not present

## 2021-02-25 LAB — EXERCISE TOLERANCE TEST
Estimated workload: 7.4 METS
Exercise duration (min): 6 min
Exercise duration (sec): 17 s
MPHR: 147 {beats}/min
Peak HR: 164 {beats}/min
Percent HR: 111 %
RPE: 15
Rest HR: 74 {beats}/min

## 2021-02-26 ENCOUNTER — Other Ambulatory Visit: Payer: Self-pay | Admitting: Cardiovascular Disease

## 2021-02-26 ENCOUNTER — Encounter: Payer: Self-pay | Admitting: *Deleted

## 2021-02-26 ENCOUNTER — Other Ambulatory Visit (HOSPITAL_COMMUNITY): Payer: Self-pay | Admitting: Cardiovascular Disease

## 2021-02-26 DIAGNOSIS — I2510 Atherosclerotic heart disease of native coronary artery without angina pectoris: Secondary | ICD-10-CM

## 2021-02-26 DIAGNOSIS — R931 Abnormal findings on diagnostic imaging of heart and coronary circulation: Secondary | ICD-10-CM

## 2021-02-26 DIAGNOSIS — R9439 Abnormal result of other cardiovascular function study: Secondary | ICD-10-CM

## 2021-02-26 MED ORDER — METOPROLOL TARTRATE 100 MG PO TABS: 100.0000 mg | ORAL_TABLET | ORAL | 1 refills | Status: DC

## 2021-02-26 NOTE — Telephone Encounter (Signed)
-----   Message from Josue Hector, MD sent at 02/26/2021 10:26 AM EDT ----- Discussed with patient no chest pain just had doctor's day calcium score that was high. Cr was 1.1 on doctors labs 4.13 no contrast allergy HR in 70's Schedule cardiac CTA no calcium score needs to be done next week not Monday as he is out of town then F/U with me after scan No contrast allergy he has had before CAll in 100 mg lopressor to [facility] [place] and pisguy thans

## 2021-02-26 NOTE — Telephone Encounter (Signed)
ORDER ENTERED AND METOPROLOL 100 MG 1 TAB  SENT TO PHARMACY.

## 2021-02-26 NOTE — Telephone Encounter (Signed)
Your cardiac CT will be scheduled at one of the below locations:   [facility] [address] [place], [place] [address] [phone]   If scheduled at [facility], please arrive at the [facility] main entrance (entrance A) of [facility] 30 minutes prior to test start time. Proceed to the [facility] Radiology Department (first floor) to check-in and test prep.  If scheduled at [facility], please arrive 15 mins early for check-in and test prep.  Please follow these instructions carefully (unless otherwise directed):  Hold all erectile dysfunction medications at least 3 days (72 hrs) prior to test.  On the Night Before the Test: . Be sure to Drink plenty of water. . Do not consume any caffeinated/decaffeinated beverages or chocolate 12 hours prior to your test. . Do not take any antihistamines 12 hours prior to your test. On the Day of the Test: . Drink plenty of water until 1 hour prior to the test. . Do not eat any food 4 hours prior to the test. . You may take your regular medications prior to the test.  . Take metoprolol (Lopressor) two hours prior to test. . HOLD Furosemide/Hydrochlorothiazide morning of the test. .  mg PO in combination with metoprolol tartrate for HR >80 bpm    *For Clinical Staff only. Please instruct patient the following:* Heart Rate Medication Recommendations for Cardiac CT  . Resting HR < 50 bpm  o No medication  . Resting HR > 65 bpm and BP >110/50 mmHG  o Metoprolol tartrate 100 mg PO 90-120 minutes prior to scan       After the Test: . Drink plenty of water. . After receiving IV contrast, you may experience a mild flushed feeling. This is normal. . On occasion, you may experience a mild rash up to 24 hours after the test. This is not dangerous. If this occurs, you can take Benadryl 25 mg and increase your fluid intake. . If you experience trouble breathing, this can be serious. If it is  severe call 911 IMMEDIATELY. If it is mild, please call our office.  Once we have confirmed authorization from your insurance company, we will call you to set up a date and time for your test. Based on how quickly your insurance processes prior authorizations requests, please allow up to 4 weeks to be contacted for scheduling your Cardiac CT appointment. Be advised that routine Cardiac CT appointments could be scheduled as many as 8 weeks after your provider has ordered it.  For non-scheduling related questions, please contact the cardiac imaging nurse navigator should you have any questions/concerns: Marchia Bond, Cardiac Imaging Nurse Navigator Gordy Clement, Cardiac Imaging Nurse Navigator [facility] Direct Office Dial: [phone]   For scheduling needs, including cancellations and rescheduling, please call [place], [phone].

## 2021-03-04 ENCOUNTER — Telehealth (HOSPITAL_COMMUNITY): Payer: Self-pay | Admitting: Emergency Medicine

## 2021-03-04 DIAGNOSIS — M5136 Other intervertebral disc degeneration, lumbar region: Secondary | ICD-10-CM | POA: Diagnosis not present

## 2021-03-04 DIAGNOSIS — M545 Low back pain, unspecified: Secondary | ICD-10-CM | POA: Diagnosis not present

## 2021-03-04 NOTE — Telephone Encounter (Signed)
Reaching out to patient to offer assistance regarding upcoming cardiac imaging study; pt verbalizes understanding of appt date/time, parking situation and where to check in, pre-test NPO status and medications ordered, and verified current allergies; name and call back number provided for further questions should they arise Marchia Bond RN Navigator Cardiac Imaging [facility] [phone] office [phone] cell  Pt will bring copy of labs to appt to scan into chart 100mg  metoprolol tartrate 2h prior to scan Clarise Cruz

## 2021-03-06 ENCOUNTER — Ambulatory Visit (HOSPITAL_COMMUNITY)
Admission: RE | Admit: 2021-03-06 | Discharge: 2021-03-06 | Disposition: A | Discharge status: Home / Self Care | Payer: Medicare Other | Source: Ambulatory Visit | Attending: Cardiovascular Disease | Admitting: Cardiovascular Disease

## 2021-03-06 ENCOUNTER — Other Ambulatory Visit: Payer: Self-pay

## 2021-03-06 ENCOUNTER — Encounter (HOSPITAL_COMMUNITY): Payer: Self-pay

## 2021-03-06 DIAGNOSIS — I2510 Atherosclerotic heart disease of native coronary artery without angina pectoris: Secondary | ICD-10-CM

## 2021-03-06 DIAGNOSIS — R931 Abnormal findings on diagnostic imaging of heart and coronary circulation: Secondary | ICD-10-CM | POA: Diagnosis not present

## 2021-03-06 DIAGNOSIS — R9430 Abnormal result of cardiovascular function study, unspecified: Secondary | ICD-10-CM | POA: Diagnosis not present

## 2021-03-06 DIAGNOSIS — R9439 Abnormal result of other cardiovascular function study: Secondary | ICD-10-CM

## 2021-03-06 MED ORDER — NITROGLYCERIN 0.4 MG SL SUBL
SUBLINGUAL_TABLET | SUBLINGUAL | Status: AC
  Filled 2021-03-06: qty 2

## 2021-03-06 MED ORDER — NITROGLYCERIN 0.4 MG SL SUBL
0.8000 mg | SUBLINGUAL_TABLET | Freq: Once | SUBLINGUAL | Status: AC
  Administered 2021-03-06: 0.8 mg via SUBLINGUAL

## 2021-03-06 MED ORDER — IOHEXOL 350 MG/ML SOLN
95.0000 mL | Freq: Once | INTRAVENOUS | Status: AC | PRN
  Administered 2021-03-06: 95 mL via INTRAVENOUS

## 2021-03-07 NOTE — Progress Notes (Signed)
CARDIOLOGY CONSULT NOTE       Patient ID: Peter YAP, MD MRN: [id number] DOB/AGE: [id number] 75 y.o.  Admit date: (Not on file) Referring Physician: Ehinger Primary Physician: [name], MD Primary Cardiologist: Johnsie Cancel Reason for Consultation: CAD  Active Problems:   * No active hospital problems. 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 Has seen SK in past for ? RVOT PVCls Rx with beta blockers starting in 2016.  TTE in 2016 normal He had a coronary calcium score done as part of Doctor's Day. Score was elevated at 320 which was 20 th percentile for age / sex He then had a ETT 02/25/21 with 2 mm of down-sloping sT depression in leads V4-6 and 2,3,F. Interestingly he did not get chest pain had normal hemodynamics and no arrhythmia  Full cardiac CTA showed likely higher risk obstructive mixed plaque in mid circumflex and ostial plaque in D1D2 small vessels FFR CT was possitive In mid/distal circumflex at 0.78, D1 was 0.89 and D2 not modeled. Also suggested FFR < 0.8 but I only saw moderate mid/distal calcified plaque in this area  Lots of questions from wife and patient All answered  Also discussed case with Dr Tamala Julian who will do cath   ROS All other systems reviewed and negative except as noted above  Past Medical History:  Diagnosis Date  . Allergic rhinitis   . Anxiety   . Cancer [facility])    Melanoma -peri umbilical '02 or '03- no further problems. 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   . Chest pain    Nuclear 2005, normal / coronary CTA 2008, slight mixed plaque, coronary calcium score 0.65, ejection fraction 55%, echo, March, 2008  . Complication of anesthesia    itching-not sure if oxycodone or anesthesia,and shakes. Right TMJ issue  chills in past  . Dizziness    Dizziness with question of presyncope April 08, 2011   . DJD (degenerative joint disease)    history DDD, fingers, knees, shoulders  . Ejection fraction    EF 55%, echo, March, 2008.  . Family history of Alzheimer's disease   . GERD (gastroesophageal reflux disease)   . Hearing impaired    bilateral hearing aids  . Heart murmur    75 yrs old rheumatic fever   . Heart murmur 2008   trivial tricusp regurg  . History of colonic polyps   . History of melanoma   . History of pseudogout   . History of recent steroid use    10-10-15 tapering steroid use for tx. recent bronchitis.  Marland Kitchen History of rheumatic fever   . HNP (herniated nucleus pulposus), cervical   . Hypercholesteremia    borderline  . Hypertension   . Lumbar spondylosis with myelopathy 06/09/2013  . Spondylosis   . Tinnitus    chronic  . TMJ click    right"was aggravated with last intubation"    Family History  Problem Relation Age of Onset  . Alzheimer's disease Father   . Alzheimer's disease Mother     Social History   Socioeconomic History  . Marital status: Married    Spouse name: Jackelyn Poling  . Number of children: 2  . Years of education: MD  . Highest education level: Not on file  Occupational History  . Occupation: RADIOLOGIST    Employer: [employer]  Tobacco Use  . Smoking status: Never Smoker  . Smokeless tobacco: Never Used  Vaping Use  . Vaping Use: Never used  Substance and Sexual Activity  . Alcohol use: Yes    Alcohol/week: 2.0 - 4.0 standard drinks    Types: 2 - 4 Glasses of wine per week    Comment: 3 x a week  . Drug use: No  . Sexual activity: Yes  Other Topics Concern  . Not on file  Social History Narrative   Married - wife = [name]   2 children- Daughter=lawyer, Son=physician   Non smoker   Exercises 3 times per week   Caffeine use: 2 cups/d   etoh use: 1-2 glasses of wine nightly   Social Determinants of Health   Financial Resource Strain: Not on file  Food Insecurity: Not on file  Transportation Needs: Not on file   Physical Activity: Not on file  Stress: Not on file  Social Connections: Not on file  Intimate Partner Violence: Not on file    Past Surgical History:  Procedure Laterality Date  . ANTERIOR CERVICAL DECOMP/DISCECTOMY FUSION  2003  . ANTERIOR CERVICAL DECOMP/DISCECTOMY FUSION N/A 05/18/2018   Procedure: ANTERIOR CERVICAL DECOMPRESSION/DISCECTOMY FUSION - CERVICAL FIVE-CERVICAL SIX, removal of tether plate;  Surgeon: Jovita Gamma, MD;  Location: [place];  Service: Neurosurgery;  Laterality: N/A;  . BACK SURGERY     x3  . CARPAL TUNNEL RELEASE Right 10/31/2013   Procedure: RIGHT CARPAL TUNNEL RELEASE;  Surgeon: Cammie Sickle., MD;  Location: [place];  Service: Orthopedics;  Laterality: Right;  . CARPAL TUNNEL RELEASE Left 11/28/2013   Procedure: LEFT CARPAL TUNNEL RELEASE;  Surgeon: Cammie Sickle., MD;  Location: [place];  Service: Orthopedics;  Laterality: Left;  . CERVICAL DISCECTOMY     anterior with patellar allograft and plating  . JOINT REPLACEMENT     right knee 3 years ago  . LUMBAR FUSION  12/14  . MELANOMA EXCISION WITH SENTINEL LYMPH NODE BIOPSY  8/02   bilat ing node bx  . ROTATOR CUFF REPAIR Left    08/13/11  08/27/2011  . TOTAL KNEE ARTHROPLASTY Right 10/07/2015   Procedure: RIGHT TOTAL KNEE ARTHROPLASTY, with synovial tissue specimen;  Surgeon: [name], MD;  Location: [facility];  Service: Orthopedics;  Laterality: Right;  . TOTAL KNEE ARTHROPLASTY Left 06/16/2019   Procedure: LEFT TOTAL KNEE ARTHROPLASTY;  Surgeon: Mcarthur Rossetti, MD;  Location: [facility];  Service: Orthopedics;  Laterality: Left;      Current Outpatient Medications:  .  alfuzosin (UROXATRAL) 10 MG 24 hr tablet, Take 10 mg by mouth every evening. , Disp: , Rfl:  .  amLODipine (NORVASC) 5 MG tablet, Take 1 tablet (5 mg total) by mouth as directed. (Patient taking differently: Take 5 mg by mouth daily. ), Disp: 90 tablet, Rfl: 1 .  celecoxib (CELEBREX)  100 MG capsule, Take 1 capsule (100 mg total) by mouth 2 (two) times daily as needed., Disp: 60 capsule, Rfl: 3 .  Cholecalciferol (VITAMIN D3) 50 MCG (2000 UT) TABS, Take by mouth., Disp: , Rfl:  .  cycloSPORINE (RESTASIS) 0.05 % ophthalmic emulsion, Place 1 drop into both eyes daily. , Disp: , Rfl:  .  metoprolol tartrate (LOPRESSOR) 100 MG tablet, Take 1 tablet (100 mg total) by mouth as directed. 2 HOURS BEFORE CT, Disp: 1 tablet, Rfl: 1 .  montelukast (SINGULAIR) 10 MG tablet, TAKE 1 TABLET BY MOUTH EVERYDAY AT BEDTIME (Patient  taking differently: Take 10 mg by mouth daily as needed (allergies). ), Disp: 90 tablet, Rfl: 0 .  pantoprazole (PROTONIX) 40 MG tablet, TAKE 1 TABLET BY MOUTH EVERY DAY 30 MINUTES BEFORE EVENING MEALS (Patient taking differently: Take 40 mg by mouth daily before supper. ), Disp: 30 tablet, Rfl: 0 .  potassium chloride SA (K-DUR,KLOR-CON) 20 MEQ tablet, Take 1 tablet (20 mEq total) by mouth daily. (Patient taking differently: Take 10 mEq by mouth every other day. In the morning.), Disp: 90 tablet, Rfl: 3 .  quinapril (ACCUPRIL) 10 MG tablet, Take 1 tablet (10 mg total) by mouth as directed. Cuts in half to make 15 (Patient taking differently: Take 10 mg by mouth at bedtime. Cuts in half to make 15 ), Disp: 90 tablet, Rfl: 2 .  rosuvastatin (CRESTOR) 10 MG tablet, Take 1 tablet (10 mg total) by mouth at bedtime., Disp: 90 tablet, Rfl: 3 .  triamterene-hydrochlorothiazide (MAXZIDE-25) 37.5-25 MG tablet, Take 1 tablet by mouth daily. , Disp: , Rfl:     Physical Exam: There were no vitals taken for this visit.   Affect appropriate Healthy:  appears stated age 6: normal Neck supple with no adenopathy JVP normal no bruits no thyromegaly Lungs clear with no wheezing and good diaphragmatic motion Heart:  S1/S2 no murmur, no rub, gallop or click PMI normal Abdomen: benighn, BS positve, no tenderness, no AAA no bruit.  No HSM or HJR Distal pulses intact with no  bruits No edema Neuro non-focal Skin warm and dry No muscular weakness   Labs:   Lab Results  Component Value Date   WBC 13.6 (H) 06/17/2019   HGB 12.9 (L) 06/17/2019   HCT 40.8 06/17/2019   MCV 93.6 06/17/2019   PLT 259 06/17/2019   No results for input(s): NA, K, CL, CO2, BUN, CREATININE, CALCIUM, PROT, BILITOT, ALKPHOS, ALT, AST, GLUCOSE in the last 168 hours.  Invalid input(s): LABALBU No results found for: CKTOTAL, CKMB, CKMBINDEX, TROPONINI  Lab Results  Component Value Date   CHOL 228 (H) 04/10/2011   CHOL 171 07/16/2008   Lab Results  Component Value Date   HDL 57.70 04/10/2011   HDL 55.8 07/16/2008   Lab Results  Component Value Date   LDLCALC 104 (H) 07/16/2008   Lab Results  Component Value Date   TRIG 53.0 04/10/2011   TRIG 54 07/16/2008   Lab Results  Component Value Date   CHOLHDL 4 04/10/2011   CHOLHDL 3.1 CALC 07/16/2008   Lab Results  Component Value Date   LDLDIRECT 151.6 04/10/2011      Radiology: CT CORONARY MORPH W/CTA COR W/SCORE W/CA W/CM &/OR WO/CM  Addendum Date: 03/06/2021   ADDENDUM REPORT: 03/06/2021 13:31 CLINICAL DATA:  39M with hypertension and coronary artery disease. EXAM: Cardiac/Coronary  CT TECHNIQUE: The patient was scanned on a [employer]. FINDINGS: A 120 kV prospective scan was triggered in the descending thoracic aorta at 111 HU's. Axial non-contrast 3 mm slices were carried out through the heart. The data set was analyzed on a dedicated work station and scored using the [place]. Gantry rotation speed was 250 msecs and collimation was .6 mm. No beta blockade and 0.8 mg of sl NTG was given. The 3D data set was reconstructed in 5% intervals of the 67-82 % of the R-R cycle. Diastolic phases were analyzed on a dedicated work station using MPR, MIP and VRT modes. The patient received 80 cc of contrast. Aorta: Normal size.  No calcifications.  No  dissection. Aortic Valve:  Trileaflet.  No calcifications. Coronary  Arteries:  Normal coronary origin.  Right dominance. RCA is a large dominant artery that gives rise to PDA and PLVB. There is minimal (<25%) calcified plaque in the proximal, mid and distal RCA. Left main is a large artery that gives rise to LAD and LCX arteries. LAD is a large vessel. There is minimal (<25%) calcified plaque in the mid LAD just after the takeoff of D1. There is mild (25-49%) calcified plaque in proximal D1. D1 is a relatively small vessel around 1.6 cm. D2 is a small (<1.5cm) vessel that is heavily calcified at the ostium. Cannot rule out significant stenosis. There is no plaque in D3. LCX is a non-dominant artery that has diffuse minimal (<25%) mixed plaque proximally and in the mid LCX with a focal region of severe (>70%) soft plaque proximally. OM1 has no plaque. Coronary Calcium Score: Not performed. Other findings: Normal pulmonary vein drainage into the left atrium. Normal let atrial appendage without a thrombus. Normal size of the pulmonary artery. IMPRESSION: 1. Normal coronary origin with right dominance. 2. There is severe plaque in the mid LCX and mild plaque in D1. D2 is a small vessel but there appears to be severe calcified plaque at the ostium. This can be overestimated due to blooming artifact. CAD-RADS 4. 3.  Will send study for FFRct. Skeet Latch, MD Electronically Signed   By: Skeet Latch   On: 03/06/2021 13:31   Result Date: 03/06/2021 EXAM: OVER-READ INTERPRETATION  CT CHEST The following report is an over-read performed by radiologist Dr. Rolm Baptise of [facility], [place] on 03/06/2021. This over-read does not include interpretation of cardiac or coronary anatomy or pathology. The coronary CTA interpretation by the cardiologist is attached. COMPARISON:  02/17/2021 FINDINGS: Vascular: Heart is normal size.  Aorta normal caliber. Mediastinum/Nodes: No adenopathy Lungs/Pleura: No confluent opacities or effusions. Upper Abdomen: Imaging into the upper abdomen  demonstrates no acute findings. Musculoskeletal: Chest wall soft tissues are unremarkable. No acute bony abnormality. IMPRESSION: No acute or significant extracardiac abnormality. Electronically Signed: By: Rolm Baptise [name] On: 03/06/2021 08:34   CT CORONARY FRACTIONAL FLOW RESERVE DATA PREP  Result Date: 03/06/2021 EXAM: FFRCT ANALYSIS FINDINGS: FFRct analysis was performed on the original cardiac CT angiogram dataset. Diagrammatic representation of the FFRct analysis is provided in a separate PDF document in PACS. This dictation was created using the PDF document and an interactive 3D model of the results. 3D model is not available in the EMR/PACS. Normal FFR range is >0.80. 1. Left Main: FFRct 1.0 (normal) 2. LAD: FFRct 0.97 proximal, 0.95 mid, 0.9 distal (no significant obstruction) D1 FFRct 0.89 (no significant obstruction). D2 is not modeled due to small caliber. D3 FFRct 0.91 (no significant obstruction). 3. LCX: FFRct 0.95 proximal (no significant obstruction), 0.83 mid (no significant obstruction), 0.78 distal (indeterminate) 4. RCA: FFRct 0.97 proximal (no significant obstruction), 0.89 mid (no significant obstruction), 0.78 distal. IMPRESSION: 1. FFRct findings are indeterminate for the significance of the mid LCX stenosis. Given the coronary CT-A findinngs in this region, cannot rule out signficant stenosis. 2. The distal RCA FFRct findings are also indeterminate. However, given the lack of significant findings noted there on coronary CT-A, the distal RCA is likely artifactual. 3. Consider additional functional testing or cardiac catheterization if clinically indicated. Tiffany C. Oval Linsey, MD Electronically Signed   By: Skeet Latch   On: 03/06/2021 13:41   EXERCISE TOLERANCE TEST (ETT)  Result Date: 02/25/2021  Patient  exercised 6:17 seconds on BRUCE protocol achieving 7.4 METs  Resting HR was 77bpm and rose to a maximum of 164bpm (111% max predicted HR). Test was stopped due to target  HR achieved.  Blood pressure demonstrated a hypertensive response to exercise.  Downsloping ST segment depression was noted during stress in the V4, V5, V6, II, III and aVF leads, and returning to baseline after 1-5 minutes of recovery.  Exercise ECG with ST depressions anterolateral and inferior leads; recommend further ischemic evaluation with either coronary CTA, myoview, or cath  Gwyndolyn Kaufman, MD   CT CARDIAC CALCIUM SCORING ([place])  Addendum Date: 02/17/2021   ADDENDUM REPORT: 02/17/2021 12:48 CLINICAL DATA:  Cardiovascular Disease Risk stratification EXAM: Coronary Calcium Score TECHNIQUE: A gated, non-contrast computed tomography scan of the heart was performed using 66mm slice thickness. Axial images were analyzed on a dedicated workstation. Calcium scoring of the coronary arteries was performed using the [place] method. FINDINGS: Coronary Calcium Score: Left main: 0 Left anterior descending artery: 88.1 Left circumflex artery: 153.0 Right coronary artery: 79.7 Total: 320 Percentile: 59th Pericardium: Normal. Ascending Aorta: Normal caliber. Non-cardiac: See separate report from [facility]. IMPRESSION: Coronary calcium score of 320. This was 59th percentile for age-, race-, and sex-matched controls. RECOMMENDATIONS: Coronary artery calcium (CAC) score is a strong predictor of incident coronary heart disease (CHD) and provides predictive information beyond traditional risk factors. CAC scoring is reasonable to use in the decision to withhold, postpone, or initiate statin therapy in intermediate-risk or selected borderline-risk asymptomatic adults (age 40-75 years and LDL-C >=70 to <190 mg/dL) who do not have diabetes or established atherosclerotic cardiovascular disease (ASCVD).* In intermediate-risk (10-year ASCVD risk >=7.5% to <20%) adults or selected borderline-risk (10-year ASCVD risk >=5% to <7.5%) adults in whom a CAC score is measured for the purpose of making a  treatment decision the following recommendations have been made: If CAC=0, it is reasonable to withhold statin therapy and reassess in 5 to 10 years, as long as higher risk conditions are absent (diabetes mellitus, family history of premature CHD in first degree relatives (males <55 years; females <65 years), cigarette smoking, or LDL >=190 mg/dL). If CAC is 1 to 99, it is reasonable to initiate statin therapy for patients >=79 years of age. If CAC is >=100 or >=75th percentile, it is reasonable to initiate statin therapy at any age. Cardiology referral should be considered for patients with CAC scores >=400 or >=75th percentile. *2018 AHA/ACC/AACVPR/AAPA/ABC/ACPM/ADA/AGS/APhA/ASPC/NLA/PCNA Guideline on the Management of Blood Cholesterol: A Report of the American College of Cardiology/American Heart Association Task Force on Clinical Practice Guidelines. J Am Coll Cardiol. 2019;73(24):3168-3209. Eleonore Chiquito, MD Electronically Signed   By: Eleonore Chiquito   On: 02/17/2021 12:48   Result Date: 02/17/2021 EXAM: OVER-READ INTERPRETATION  CT CHEST The following report is an over-read performed by radiologist Dr. Vinnie Langton of [facility], [place] on 02/17/2021. This over-read does not include interpretation of cardiac or coronary anatomy or pathology. The coronary calcium score interpretation by the cardiologist is attached. COMPARISON:  Coronary calcium score 01/20/2007. FINDINGS: Within the visualized portions of the thorax there are no suspicious appearing pulmonary nodules or masses, there is no acute consolidative airspace disease, no pleural effusions, no pneumothorax and no lymphadenopathy. Visualized portions of the upper abdomen are unremarkable. There are no aggressive appearing lytic or blastic lesions noted in the visualized portions of the skeleton. IMPRESSION: No significant incidental noncardiac findings are noted. Electronically Signed: By: Vinnie Langton [name] On: 02/17/2021 08:15  EKG:  NSR normal ECG    ASSESSMENT AND PLAN:   1.  CAD:  Patient not having chest pain but high calcium score functionaly significant circumflex disease by FFR. Unfavorable mixed plaque morphology and high risk ETT with 2.5 mm down-sloping ST depression in multiple leads. Lengthy discussion with patient and wife regarding options and all favor diagnostic cath and possible intervention. I have arranged for Tuesday 5/24 with Dr Tamala Julian and personally discussed the case with him.  Continue ASA/statin and beta blocker Risks of cath including stroke, bleeding and need for emergency CABG discussed willing to proceed Has had contrast before with no issues   2. HTN:  Well controlled.  Continue current medications and low sodium Dash type diet.    3. HLD on crestor LDL 72 02/05/21  F/U post cath  Pre cath labs ordered   Signed: [name] 03/07/2021, 3:26 PM

## 2021-03-07 NOTE — H&P (View-Only) (Signed)
CARDIOLOGY CONSULT NOTE       Patient ID: Peter VILLARUZ, MD MRN: [id number] DOB/AGE: 1945-11-20 75 y.o.  Admit date: (Not on file) Referring Physician: Ehinger Primary Physician: [name], MD Primary Cardiologist: Johnsie Cancel Reason for Consultation: CAD  Active Problems:   * No active hospital problems. 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 Has seen SK in past for ? RVOT PVCls Rx with beta blockers starting in 2016.  TTE in 2016 normal He had a coronary calcium score done as part of Doctor's Day. Score was elevated at 320 which was 67 th percentile for age / sex He then had a ETT 02/25/21 with 2 mm of down-sloping sT depression in leads V4-6 and 2,3,F. Interestingly he did not get chest pain had normal hemodynamics and no arrhythmia  Full cardiac CTA showed likely higher risk obstructive mixed plaque in mid circumflex and ostial plaque in D1D2 small vessels FFR CT was possitive In mid/distal circumflex at 0.78, D1 was 0.89 and D2 not modeled. Also suggested FFR < 0.8 but I only saw moderate mid/distal calcified plaque in this area  Lots of questions from wife and patient All answered  Also discussed case with Dr Tamala Julian who will do cath   ROS All other systems reviewed and negative except as noted above  Past Medical History:  Diagnosis Date  . Allergic rhinitis   . Anxiety   . Cancer [facility])    Melanoma -peri umbilical '02 or '03- no further problems. 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   . Chest pain    Nuclear 2005, normal / coronary CTA 2008, slight mixed plaque, coronary calcium score 0.65, ejection fraction 55%, echo, March, 2008  . Complication of anesthesia    itching-not sure if oxycodone or anesthesia,and shakes. Right TMJ issue  chills in past  . Dizziness    Dizziness with question of presyncope April 08, 2011   . DJD (degenerative joint disease)    history DDD, fingers, knees, shoulders  . Ejection fraction    EF 55%, echo, March, 2008.  . Family history of Alzheimer's disease   . GERD (gastroesophageal reflux disease)   . Hearing impaired    bilateral hearing aids  . Heart murmur    75 yrs old rheumatic fever   . Heart murmur 2008   trivial tricusp regurg  . History of colonic polyps   . History of melanoma   . History of pseudogout   . History of recent steroid use    10-10-15 tapering steroid use for tx. recent bronchitis.  Marland Kitchen History of rheumatic fever   . HNP (herniated nucleus pulposus), cervical   . Hypercholesteremia    borderline  . Hypertension   . Lumbar spondylosis with myelopathy 06/09/2013  . Spondylosis   . Tinnitus    chronic  . TMJ click    right"was aggravated with last intubation"    Family History  Problem Relation Age of Onset  . Alzheimer's disease Father   . Alzheimer's disease Mother     Social History   Socioeconomic History  . Marital status: Married    Spouse name: Jackelyn Poling  . Number of children: 2  . Years of education: MD  . Highest education level: Not on file  Occupational History  . Occupation: RADIOLOGIST    Employer: [employer]  Tobacco Use  . Smoking status: Never Smoker  . Smokeless tobacco: Never Used  Vaping Use  . Vaping Use: Never used  Substance and Sexual Activity  . Alcohol use: Yes    Alcohol/week: 2.0 - 4.0 standard drinks    Types: 2 - 4 Glasses of wine per week    Comment: 3 x a week  . Drug use: No  . Sexual activity: Yes  Other Topics Concern  . Not on file  Social History Narrative   Married - wife = [name]   2 children- Daughter=lawyer, Son=physician   Non smoker   Exercises 3 times per week   Caffeine use: 2 cups/d   etoh use: 1-2 glasses of wine nightly   Social Determinants of Health   Financial Resource Strain: Not on file  Food Insecurity: Not on file  Transportation Needs: Not on file   Physical Activity: Not on file  Stress: Not on file  Social Connections: Not on file  Intimate Partner Violence: Not on file    Past Surgical History:  Procedure Laterality Date  . ANTERIOR CERVICAL DECOMP/DISCECTOMY FUSION  2003  . ANTERIOR CERVICAL DECOMP/DISCECTOMY FUSION N/A 05/18/2018   Procedure: ANTERIOR CERVICAL DECOMPRESSION/DISCECTOMY FUSION - CERVICAL FIVE-CERVICAL SIX, removal of tether plate;  Surgeon: Jovita Gamma, MD;  Location: [place];  Service: Neurosurgery;  Laterality: N/A;  . BACK SURGERY     x3  . CARPAL TUNNEL RELEASE Right 10/31/2013   Procedure: RIGHT CARPAL TUNNEL RELEASE;  Surgeon: Cammie Sickle., MD;  Location: [place];  Service: Orthopedics;  Laterality: Right;  . CARPAL TUNNEL RELEASE Left 11/28/2013   Procedure: LEFT CARPAL TUNNEL RELEASE;  Surgeon: Cammie Sickle., MD;  Location: [place];  Service: Orthopedics;  Laterality: Left;  . CERVICAL DISCECTOMY     anterior with patellar allograft and plating  . JOINT REPLACEMENT     right knee 3 years ago  . LUMBAR FUSION  12/14  . MELANOMA EXCISION WITH SENTINEL LYMPH NODE BIOPSY  8/02   bilat ing node bx  . ROTATOR CUFF REPAIR Left    08/13/11  08/27/2011  . TOTAL KNEE ARTHROPLASTY Right 10/07/2015   Procedure: RIGHT TOTAL KNEE ARTHROPLASTY, with synovial tissue specimen;  Surgeon: [name], MD;  Location: [facility];  Service: Orthopedics;  Laterality: Right;  . TOTAL KNEE ARTHROPLASTY Left 06/16/2019   Procedure: LEFT TOTAL KNEE ARTHROPLASTY;  Surgeon: Mcarthur Rossetti, MD;  Location: [facility];  Service: Orthopedics;  Laterality: Left;      Current Outpatient Medications:  .  alfuzosin (UROXATRAL) 10 MG 24 hr tablet, Take 10 mg by mouth every evening. , Disp: , Rfl:  .  amLODipine (NORVASC) 5 MG tablet, Take 1 tablet (5 mg total) by mouth as directed. (Patient taking differently: Take 5 mg by mouth daily. ), Disp: 90 tablet, Rfl: 1 .  celecoxib (CELEBREX)  100 MG capsule, Take 1 capsule (100 mg total) by mouth 2 (two) times daily as needed., Disp: 60 capsule, Rfl: 3 .  Cholecalciferol (VITAMIN D3) 50 MCG (2000 UT) TABS, Take by mouth., Disp: , Rfl:  .  cycloSPORINE (RESTASIS) 0.05 % ophthalmic emulsion, Place 1 drop into both eyes daily. , Disp: , Rfl:  .  metoprolol tartrate (LOPRESSOR) 100 MG tablet, Take 1 tablet (100 mg total) by mouth as directed. 2 HOURS BEFORE CT, Disp: 1 tablet, Rfl: 1 .  montelukast (SINGULAIR) 10 MG tablet, TAKE 1 TABLET BY MOUTH EVERYDAY AT BEDTIME (Patient  taking differently: Take 10 mg by mouth daily as needed (allergies). ), Disp: 90 tablet, Rfl: 0 .  pantoprazole (PROTONIX) 40 MG tablet, TAKE 1 TABLET BY MOUTH EVERY DAY 30 MINUTES BEFORE EVENING MEALS (Patient taking differently: Take 40 mg by mouth daily before supper. ), Disp: 30 tablet, Rfl: 0 .  potassium chloride SA (K-DUR,KLOR-CON) 20 MEQ tablet, Take 1 tablet (20 mEq total) by mouth daily. (Patient taking differently: Take 10 mEq by mouth every other day. In the morning.), Disp: 90 tablet, Rfl: 3 .  quinapril (ACCUPRIL) 10 MG tablet, Take 1 tablet (10 mg total) by mouth as directed. Cuts in half to make 15 (Patient taking differently: Take 10 mg by mouth at bedtime. Cuts in half to make 15 ), Disp: 90 tablet, Rfl: 2 .  rosuvastatin (CRESTOR) 10 MG tablet, Take 1 tablet (10 mg total) by mouth at bedtime., Disp: 90 tablet, Rfl: 3 .  triamterene-hydrochlorothiazide (MAXZIDE-25) 37.5-25 MG tablet, Take 1 tablet by mouth daily. , Disp: , Rfl:     Physical Exam: There were no vitals taken for this visit.   Affect appropriate Healthy:  appears stated age 72: normal Neck supple with no adenopathy JVP normal no bruits no thyromegaly Lungs clear with no wheezing and good diaphragmatic motion Heart:  S1/S2 no murmur, no rub, gallop or click PMI normal Abdomen: benighn, BS positve, no tenderness, no AAA no bruit.  No HSM or HJR Distal pulses intact with no  bruits No edema Neuro non-focal Skin warm and dry No muscular weakness   Labs:   Lab Results  Component Value Date   WBC 13.6 (H) 06/17/2019   HGB 12.9 (L) 06/17/2019   HCT 40.8 06/17/2019   MCV 93.6 06/17/2019   PLT 259 06/17/2019   No results for input(s): NA, K, CL, CO2, BUN, CREATININE, CALCIUM, PROT, BILITOT, ALKPHOS, ALT, AST, GLUCOSE in the last 168 hours.  Invalid input(s): LABALBU No results found for: CKTOTAL, CKMB, CKMBINDEX, TROPONINI  Lab Results  Component Value Date   CHOL 228 (H) 04/10/2011   CHOL 171 07/16/2008   Lab Results  Component Value Date   HDL 57.70 04/10/2011   HDL 55.8 07/16/2008   Lab Results  Component Value Date   LDLCALC 104 (H) 07/16/2008   Lab Results  Component Value Date   TRIG 53.0 04/10/2011   TRIG 54 07/16/2008   Lab Results  Component Value Date   CHOLHDL 4 04/10/2011   CHOLHDL 3.1 CALC 07/16/2008   Lab Results  Component Value Date   LDLDIRECT 151.6 04/10/2011      Radiology: CT CORONARY MORPH W/CTA COR W/SCORE W/CA W/CM &/OR WO/CM  Addendum Date: 03/06/2021   ADDENDUM REPORT: 03/06/2021 13:31 CLINICAL DATA:  68M with hypertension and coronary artery disease. EXAM: Cardiac/Coronary  CT TECHNIQUE: The patient was scanned on a [employer]. FINDINGS: A 120 kV prospective scan was triggered in the descending thoracic aorta at 111 HU's. Axial non-contrast 3 mm slices were carried out through the heart. The data set was analyzed on a dedicated work station and scored using the [place]. Gantry rotation speed was 250 msecs and collimation was .6 mm. No beta blockade and 0.8 mg of sl NTG was given. The 3D data set was reconstructed in 5% intervals of the 67-82 % of the R-R cycle. Diastolic phases were analyzed on a dedicated work station using MPR, MIP and VRT modes. The patient received 80 cc of contrast. Aorta: Normal size.  No calcifications.  No  dissection. Aortic Valve:  Trileaflet.  No calcifications. Coronary  Arteries:  Normal coronary origin.  Right dominance. RCA is a large dominant artery that gives rise to PDA and PLVB. There is minimal (<25%) calcified plaque in the proximal, mid and distal RCA. Left main is a large artery that gives rise to LAD and LCX arteries. LAD is a large vessel. There is minimal (<25%) calcified plaque in the mid LAD just after the takeoff of D1. There is mild (25-49%) calcified plaque in proximal D1. D1 is a relatively small vessel around 1.6 cm. D2 is a small (<1.5cm) vessel that is heavily calcified at the ostium. Cannot rule out significant stenosis. There is no plaque in D3. LCX is a non-dominant artery that has diffuse minimal (<25%) mixed plaque proximally and in the mid LCX with a focal region of severe (>70%) soft plaque proximally. OM1 has no plaque. Coronary Calcium Score: Not performed. Other findings: Normal pulmonary vein drainage into the left atrium. Normal let atrial appendage without a thrombus. Normal size of the pulmonary artery. IMPRESSION: 1. Normal coronary origin with right dominance. 2. There is severe plaque in the mid LCX and mild plaque in D1. D2 is a small vessel but there appears to be severe calcified plaque at the ostium. This can be overestimated due to blooming artifact. CAD-RADS 4. 3.  Will send study for FFRct. Skeet Latch, MD Electronically Signed   By: Skeet Latch   On: 03/06/2021 13:31   Result Date: 03/06/2021 EXAM: OVER-READ INTERPRETATION  CT CHEST The following report is an over-read performed by radiologist Dr. Rolm Baptise of [facility], [place] on 03/06/2021. This over-read does not include interpretation of cardiac or coronary anatomy or pathology. The coronary CTA interpretation by the cardiologist is attached. COMPARISON:  02/17/2021 FINDINGS: Vascular: Heart is normal size.  Aorta normal caliber. Mediastinum/Nodes: No adenopathy Lungs/Pleura: No confluent opacities or effusions. Upper Abdomen: Imaging into the upper abdomen  demonstrates no acute findings. Musculoskeletal: Chest wall soft tissues are unremarkable. No acute bony abnormality. IMPRESSION: No acute or significant extracardiac abnormality. Electronically Signed: By: Rolm Baptise [name] On: 03/06/2021 08:34   CT CORONARY FRACTIONAL FLOW RESERVE DATA PREP  Result Date: 03/06/2021 EXAM: FFRCT ANALYSIS FINDINGS: FFRct analysis was performed on the original cardiac CT angiogram dataset. Diagrammatic representation of the FFRct analysis is provided in a separate PDF document in PACS. This dictation was created using the PDF document and an interactive 3D model of the results. 3D model is not available in the EMR/PACS. Normal FFR range is >0.80. 1. Left Main: FFRct 1.0 (normal) 2. LAD: FFRct 0.97 proximal, 0.95 mid, 0.9 distal (no significant obstruction) D1 FFRct 0.89 (no significant obstruction). D2 is not modeled due to small caliber. D3 FFRct 0.91 (no significant obstruction). 3. LCX: FFRct 0.95 proximal (no significant obstruction), 0.83 mid (no significant obstruction), 0.78 distal (indeterminate) 4. RCA: FFRct 0.97 proximal (no significant obstruction), 0.89 mid (no significant obstruction), 0.78 distal. IMPRESSION: 1. FFRct findings are indeterminate for the significance of the mid LCX stenosis. Given the coronary CT-A findinngs in this region, cannot rule out signficant stenosis. 2. The distal RCA FFRct findings are also indeterminate. However, given the lack of significant findings noted there on coronary CT-A, the distal RCA is likely artifactual. 3. Consider additional functional testing or cardiac catheterization if clinically indicated. Tiffany C. Oval Linsey, MD Electronically Signed   By: Skeet Latch   On: 03/06/2021 13:41   EXERCISE TOLERANCE TEST (ETT)  Result Date: 02/25/2021  Patient  exercised 6:17 seconds on BRUCE protocol achieving 7.4 METs  Resting HR was 77bpm and rose to a maximum of 164bpm (111% max predicted HR). Test was stopped due to target  HR achieved.  Blood pressure demonstrated a hypertensive response to exercise.  Downsloping ST segment depression was noted during stress in the V4, V5, V6, II, III and aVF leads, and returning to baseline after 1-5 minutes of recovery.  Exercise ECG with ST depressions anterolateral and inferior leads; recommend further ischemic evaluation with either coronary CTA, myoview, or cath  Gwyndolyn Kaufman, MD   CT CARDIAC CALCIUM SCORING ([place])  Addendum Date: 02/17/2021   ADDENDUM REPORT: 02/17/2021 12:48 CLINICAL DATA:  Cardiovascular Disease Risk stratification EXAM: Coronary Calcium Score TECHNIQUE: A gated, non-contrast computed tomography scan of the heart was performed using 66mm slice thickness. Axial images were analyzed on a dedicated workstation. Calcium scoring of the coronary arteries was performed using the Agatston method. FINDINGS: Coronary Calcium Score: Left main: 0 Left anterior descending artery: 88.1 Left circumflex artery: 153.0 Right coronary artery: 79.7 Total: 320 Percentile: 59th Pericardium: Normal. Ascending Aorta: Normal caliber. Non-cardiac: See separate report from [facility]. IMPRESSION: Coronary calcium score of 320. This was 59th percentile for age-, race-, and sex-matched controls. RECOMMENDATIONS: Coronary artery calcium (CAC) score is a strong predictor of incident coronary heart disease (CHD) and provides predictive information beyond traditional risk factors. CAC scoring is reasonable to use in the decision to withhold, postpone, or initiate statin therapy in intermediate-risk or selected borderline-risk asymptomatic adults (age 73-75 years and LDL-C >=70 to <190 mg/dL) who do not have diabetes or established atherosclerotic cardiovascular disease (ASCVD).* In intermediate-risk (10-year ASCVD risk >=7.5% to <20%) adults or selected borderline-risk (10-year ASCVD risk >=5% to <7.5%) adults in whom a CAC score is measured for the purpose of making a  treatment decision the following recommendations have been made: If CAC=0, it is reasonable to withhold statin therapy and reassess in 5 to 10 years, as long as higher risk conditions are absent (diabetes mellitus, family history of premature CHD in first degree relatives (males <55 years; females <65 years), cigarette smoking, or LDL >=190 mg/dL). If CAC is 1 to 99, it is reasonable to initiate statin therapy for patients >=79 years of age. If CAC is >=100 or >=75th percentile, it is reasonable to initiate statin therapy at any age. Cardiology referral should be considered for patients with CAC scores >=400 or >=75th percentile. *2018 AHA/ACC/AACVPR/AAPA/ABC/ACPM/ADA/AGS/APhA/ASPC/NLA/PCNA Guideline on the Management of Blood Cholesterol: A Report of the American College of Cardiology/American Heart Association Task Force on Clinical Practice Guidelines. J Am Coll Cardiol. 2019;73(24):3168-3209. Eleonore Chiquito, MD Electronically Signed   By: Eleonore Chiquito   On: 02/17/2021 12:48   Result Date: 02/17/2021 EXAM: OVER-READ INTERPRETATION  CT CHEST The following report is an over-read performed by radiologist Dr. Vinnie Langton of [facility], [place] on 02/17/2021. This over-read does not include interpretation of cardiac or coronary anatomy or pathology. The coronary calcium score interpretation by the cardiologist is attached. COMPARISON:  Coronary calcium score 01/20/2007. FINDINGS: Within the visualized portions of the thorax there are no suspicious appearing pulmonary nodules or masses, there is no acute consolidative airspace disease, no pleural effusions, no pneumothorax and no lymphadenopathy. Visualized portions of the upper abdomen are unremarkable. There are no aggressive appearing lytic or blastic lesions noted in the visualized portions of the skeleton. IMPRESSION: No significant incidental noncardiac findings are noted. Electronically Signed: By: Vinnie Langton [name] On: 02/17/2021 08:15  EKG:  NSR normal ECG    ASSESSMENT AND PLAN:   1.  CAD:  Patient not having chest pain but high calcium score functionaly significant circumflex disease by FFR. Unfavorable mixed plaque morphology and high risk ETT with 2.5 mm down-sloping ST depression in multiple leads. Lengthy discussion with patient and wife regarding options and all favor diagnostic cath and possible intervention. I have arranged for Tuesday 5/24 with Dr Tamala Julian and personally discussed the case with him.  Continue ASA/statin and beta blocker Risks of cath including stroke, bleeding and need for emergency CABG discussed willing to proceed Has had contrast before with no issues   2. HTN:  Well controlled.  Continue current medications and low sodium Dash type diet.    3. HLD on crestor LDL 72 02/05/21  F/U post cath  Pre cath labs ordered   Signed: [name] 03/07/2021, 3:26 PM

## 2021-03-11 DIAGNOSIS — Z0000 Encounter for general adult medical examination without abnormal findings: Secondary | ICD-10-CM | POA: Diagnosis not present

## 2021-03-11 DIAGNOSIS — E7800 Pure hypercholesterolemia, unspecified: Secondary | ICD-10-CM | POA: Diagnosis not present

## 2021-03-11 DIAGNOSIS — I10 Essential (primary) hypertension: Secondary | ICD-10-CM | POA: Diagnosis not present

## 2021-03-11 DIAGNOSIS — N529 Male erectile dysfunction, unspecified: Secondary | ICD-10-CM | POA: Diagnosis not present

## 2021-03-11 DIAGNOSIS — G629 Polyneuropathy, unspecified: Secondary | ICD-10-CM | POA: Diagnosis not present

## 2021-03-11 DIAGNOSIS — Z1389 Encounter for screening for other disorder: Secondary | ICD-10-CM | POA: Diagnosis not present

## 2021-03-11 DIAGNOSIS — M1180 Other specified crystal arthropathies, unspecified site: Secondary | ICD-10-CM | POA: Diagnosis not present

## 2021-03-11 DIAGNOSIS — Z8679 Personal history of other diseases of the circulatory system: Secondary | ICD-10-CM | POA: Diagnosis not present

## 2021-03-11 DIAGNOSIS — K2100 Gastro-esophageal reflux disease with esophagitis, without bleeding: Secondary | ICD-10-CM | POA: Diagnosis not present

## 2021-03-11 DIAGNOSIS — M1990 Unspecified osteoarthritis, unspecified site: Secondary | ICD-10-CM | POA: Diagnosis not present

## 2021-03-11 DIAGNOSIS — Z85820 Personal history of malignant melanoma of skin: Secondary | ICD-10-CM | POA: Diagnosis not present

## 2021-03-11 DIAGNOSIS — N400 Enlarged prostate without lower urinary tract symptoms: Secondary | ICD-10-CM | POA: Diagnosis not present

## 2021-03-14 ENCOUNTER — Telehealth: Payer: Self-pay | Admitting: Cardiovascular Disease

## 2021-03-14 ENCOUNTER — Ambulatory Visit (INDEPENDENT_AMBULATORY_CARE_PROVIDER_SITE_OTHER): Payer: Medicare Other | Admitting: Cardiovascular Disease

## 2021-03-14 ENCOUNTER — Other Ambulatory Visit: Payer: Self-pay

## 2021-03-14 ENCOUNTER — Encounter: Payer: Self-pay | Admitting: Cardiovascular Disease

## 2021-03-14 VITALS — BP 132/72 | HR 90 | Ht 69.0 in | Wt 174.6 lb

## 2021-03-14 DIAGNOSIS — Z01818 Encounter for other preprocedural examination: Secondary | ICD-10-CM

## 2021-03-14 DIAGNOSIS — I2584 Coronary atherosclerosis due to calcified coronary lesion: Secondary | ICD-10-CM | POA: Diagnosis not present

## 2021-03-14 DIAGNOSIS — R931 Abnormal findings on diagnostic imaging of heart and coronary circulation: Secondary | ICD-10-CM

## 2021-03-14 DIAGNOSIS — Z01812 Encounter for preprocedural laboratory examination: Secondary | ICD-10-CM | POA: Diagnosis not present

## 2021-03-14 DIAGNOSIS — I2510 Atherosclerotic heart disease of native coronary artery without angina pectoris: Secondary | ICD-10-CM | POA: Diagnosis not present

## 2021-03-14 DIAGNOSIS — E875 Hyperkalemia: Secondary | ICD-10-CM

## 2021-03-14 LAB — BASIC METABOLIC PANEL
BUN/Creatinine Ratio: 18 (ref 10–24)
BUN: 25 mg/dL (ref 8–27)
CO2: 26 mmol/L (ref 20–29)
Calcium: 10.6 mg/dL — ABNORMAL HIGH (ref 8.6–10.2)
Chloride: 99 mmol/L (ref 96–106)
Creatinine, Ser: 1.38 mg/dL — ABNORMAL HIGH (ref 0.76–1.27)
Glucose: 91 mg/dL (ref 65–99)
Potassium: 6.3 mmol/L (ref 3.5–5.2)
Sodium: 139 mmol/L (ref 134–144)
eGFR: 54 mL/min/{1.73_m2} — ABNORMAL LOW (ref >59)

## 2021-03-14 LAB — CBC
Hematocrit: 49.6 % (ref 37.5–51.0)
Hemoglobin: 16.4 g/dL (ref 13.0–17.7)
MCH: 30.3 pg (ref 26.6–33.0)
MCHC: 33.1 g/dL (ref 31.5–35.7)
MCV: 92 fL (ref 79–97)
Platelets: 288 10*3/uL (ref 150–450)
RBC: 5.42 x10E6/uL (ref 4.14–5.80)
RDW: 11.8 % (ref 11.6–15.4)
WBC: 8.9 10*3/uL (ref 3.4–10.8)

## 2021-03-14 NOTE — Telephone Encounter (Signed)
[facility] is calling with critical results for this pt

## 2021-03-14 NOTE — Telephone Encounter (Signed)
Peter Hector, MD   03/14/21 4:54 PM Spoke with patient Knows to go to ER for low HR  He will hold his diuretic, K and quniapril and hydrate  His labs were fine on 4/22  Repeat stat BMET Monday am 9:00 at our office

## 2021-03-14 NOTE — Patient Instructions (Signed)
Medication Instructions:  NO CHANGES *If you need a refill on your cardiac medications before your next appointment, please call your pharmacy*   Lab Work: TODAY BMET CBC If you have labs (blood work) drawn today and your tests are completely normal, you will receive your results only by: Marland Kitchen MyChart Message (if you have MyChart) OR . A paper copy in the mail If you have any lab test that is abnormal or we need to change your treatment, we will call you to review the results.   Testing/Procedures:    Follow-Up: At [facility], you and your health needs are our priority.  As part of our continuing mission to provide you with exceptional heart care, we have created designated Provider Care Teams.  These Care Teams include your primary Cardiologist (physician) and Advanced Practice Providers (APPs -  Physician Assistants and Nurse Practitioners) who all work together to provide you with the care you need, when you need it.  We recommend signing up for the patient portal called "MyChart".  Sign up information is provided on this After Visit Summary.  MyChart is used to connect with patients for Virtual Visits (Telemedicine).  Patients are able to view lab/test results, encounter notes, upcoming appointments, etc.  Non-urgent messages can be sent to your provider as well.   To learn more about what you can do with MyChart, go to [email or web address].    Your next appointment:   2 week(s)  The format for your next appointment:   In Person  Provider:   [name], MD    [place] OFFICE [place], [place] [address] Dept: [phone] Loc: [phone]  [name], MD  03/14/2021  You are scheduled for a Cardiac Catheterization on Tuesday, May 24 with Dr. Daneen Schick.  1. Please arrive at the [facility] (Main Entrance A) at [facility]: [address]  [place], [place] [address] at 5:30 AM (This time is two hours before your procedure to ensure your preparation). Free valet parking service is available.   Special note: Every effort is made to have your procedure done on time. Please understand that emergencies sometimes delay scheduled procedures.  2. Diet: Do not eat solid foods after midnight.  The patient may have clear liquids until 5am upon the day of the procedure.  3. Labs: You will need to have blood drawn on Friday, May 20 at [facility] at [facility]. [address]  Open: 7:30am - 5pm    Phone: [phone]. You do not need to be fasting.  4. Medication instructions in preparation for your procedure:   Contrast Allergy: No   5. Plan for one night stay--bring personal belongings. 6. Bring a current list of your medications and current insurance cards. 7. You MUST have a responsible person to drive you home. 8. Someone MUST be with you the first 24 hours after you arrive home or your discharge will be delayed. 9. Please wear clothes that are easy to get on and off and wear slip-on shoes.  Thank you for allowing [place] to care for you!   -- [facility]

## 2021-03-14 NOTE — Telephone Encounter (Signed)
Spoke with [facility].  Patient's potassium is 6.3

## 2021-03-17 ENCOUNTER — Telehealth: Payer: Self-pay | Admitting: *Deleted

## 2021-03-17 ENCOUNTER — Other Ambulatory Visit: Payer: Medicare Other | Admitting: *Deleted

## 2021-03-17 ENCOUNTER — Other Ambulatory Visit: Payer: Self-pay

## 2021-03-17 DIAGNOSIS — E875 Hyperkalemia: Secondary | ICD-10-CM | POA: Diagnosis not present

## 2021-03-17 DIAGNOSIS — I2510 Atherosclerotic heart disease of native coronary artery without angina pectoris: Secondary | ICD-10-CM

## 2021-03-17 LAB — BASIC METABOLIC PANEL
BUN/Creatinine Ratio: 19 (ref 10–24)
BUN: 20 mg/dL (ref 8–27)
CO2: 25 mmol/L (ref 20–29)
Calcium: 9.6 mg/dL (ref 8.6–10.2)
Chloride: 101 mmol/L (ref 96–106)
Creatinine, Ser: 1.03 mg/dL (ref 0.76–1.27)
Glucose: 100 mg/dL — ABNORMAL HIGH (ref 65–99)
Potassium: 5.1 mmol/L (ref 3.5–5.2)
Sodium: 136 mmol/L (ref 134–144)
eGFR: 77 mL/min/{1.73_m2} (ref >59)

## 2021-03-17 NOTE — Telephone Encounter (Addendum)
Pt contacted pre-catheterization scheduled at [facility] for: Tuesday Mar 18, 2021 7:30 AM Verified arrival time and place: [facility]) at: 5:30 AM   No solid food after midnight prior to cath, clear liquids until 5 AM day of procedure.  Hold: Quinapril- hold until post procedure per Dr Johnsie Cancel Maxide/KCl-hold until post procedure per Dr Johnsie Cancel  Except hold medications AM meds can be  taken pre-cath with sips of water including: ASA 81 mg   Confirmed patient has responsible adult to drive home post procedure and be with patient first 24 hours after arriving home: yes  You are allowed ONE visitor in the waiting room during the time you are at the hospital for your procedure. Both you and your visitor must wear a mask once you enter the hospital.    Reviewed procedure/mask/visitor instructions with patient.  Patient reports currently no symptoms concerning for COVID-19, no one in his household with symptoms concerning for COVID-19. Patient reports 4-5 days ago he had allergy symptoms, did test for COVID and was negative, no fever, symptoms have improved/resolved.  *See BMP results 03/14/21 and 03/17/21-pt reports he held amlodipine instead of quinapril. Per Dr Johnsie Cancel 03/17/21-pt to restart amlodipine, hold quinapril.  Pt aware he should restart amlodipine, hold quinapril, and continue to hold diuretic/KCl.

## 2021-03-18 ENCOUNTER — Other Ambulatory Visit: Payer: Self-pay

## 2021-03-18 ENCOUNTER — Ambulatory Visit (HOSPITAL_COMMUNITY)
Admission: RE | Admit: 2021-03-18 | Discharge: 2021-03-18 | Disposition: A | Discharge status: Home / Self Care | Payer: Medicare Other | Source: Ambulatory Visit | Attending: Interventional Cardiology | Admitting: Interventional Cardiology

## 2021-03-18 ENCOUNTER — Encounter (HOSPITAL_COMMUNITY)
Admission: RE | Disposition: A | Discharge status: Home / Self Care | Payer: Self-pay | Source: Ambulatory Visit | Attending: Interventional Cardiology

## 2021-03-18 ENCOUNTER — Encounter (HOSPITAL_COMMUNITY): Payer: Self-pay | Admitting: Interventional Cardiology

## 2021-03-18 DIAGNOSIS — Z96653 Presence of artificial knee joint, bilateral: Secondary | ICD-10-CM | POA: Insufficient documentation

## 2021-03-18 DIAGNOSIS — R079 Chest pain, unspecified: Secondary | ICD-10-CM | POA: Diagnosis present

## 2021-03-18 DIAGNOSIS — Z981 Arthrodesis status: Secondary | ICD-10-CM | POA: Diagnosis not present

## 2021-03-18 DIAGNOSIS — I10 Essential (primary) hypertension: Secondary | ICD-10-CM | POA: Diagnosis not present

## 2021-03-18 DIAGNOSIS — Z86010 Personal history of colonic polyps: Secondary | ICD-10-CM | POA: Diagnosis not present

## 2021-03-18 DIAGNOSIS — R9439 Abnormal result of other cardiovascular function study: Secondary | ICD-10-CM | POA: Insufficient documentation

## 2021-03-18 DIAGNOSIS — Z85820 Personal history of malignant melanoma of skin: Secondary | ICD-10-CM | POA: Insufficient documentation

## 2021-03-18 DIAGNOSIS — I2510 Atherosclerotic heart disease of native coronary artery without angina pectoris: Secondary | ICD-10-CM

## 2021-03-18 DIAGNOSIS — Z79899 Other long term (current) drug therapy: Secondary | ICD-10-CM | POA: Insufficient documentation

## 2021-03-18 DIAGNOSIS — E785 Hyperlipidemia, unspecified: Secondary | ICD-10-CM | POA: Diagnosis not present

## 2021-03-18 HISTORY — PX: LEFT HEART CATH AND CORONARY ANGIOGRAPHY: CATH118249

## 2021-03-18 SURGERY — LEFT HEART CATH AND CORONARY ANGIOGRAPHY
Anesthesia: LOCAL

## 2021-03-18 MED ORDER — FENTANYL CITRATE (PF) 100 MCG/2ML IJ SOLN
INTRAMUSCULAR | Status: DC | PRN
  Administered 2021-03-18 (×4): 25 ug via INTRAVENOUS

## 2021-03-18 MED ORDER — VERAPAMIL HCL 2.5 MG/ML IV SOLN
INTRAVENOUS | Status: AC
  Filled 2021-03-18: qty 2

## 2021-03-18 MED ORDER — FENTANYL CITRATE (PF) 100 MCG/2ML IJ SOLN
INTRAMUSCULAR | Status: AC
  Filled 2021-03-18: qty 2

## 2021-03-18 MED ORDER — MIDAZOLAM HCL 2 MG/2ML IJ SOLN
INTRAMUSCULAR | Status: AC
  Filled 2021-03-18: qty 2

## 2021-03-18 MED ORDER — SODIUM CHLORIDE 0.9% FLUSH: 3.0000 mL | Freq: Two times a day (BID) | INTRAVENOUS | Status: DC

## 2021-03-18 MED ORDER — VERAPAMIL HCL 2.5 MG/ML IV SOLN
INTRAVENOUS | Status: DC | PRN
  Administered 2021-03-18: 10 mL via INTRA_ARTERIAL

## 2021-03-18 MED ORDER — SODIUM CHLORIDE 0.9 % WEIGHT BASED INFUSION
3.0000 mL/kg/h | INTRAVENOUS | Status: AC
  Administered 2021-03-18: 3 mL/kg/h via INTRAVENOUS

## 2021-03-18 MED ORDER — MIDAZOLAM HCL 2 MG/2ML IJ SOLN
INTRAMUSCULAR | Status: DC | PRN
  Administered 2021-03-18: 1 mg via INTRAVENOUS
  Administered 2021-03-18 (×3): 0.5 mg via INTRAVENOUS

## 2021-03-18 MED ORDER — HEPARIN (PORCINE) IN NACL 1000-0.9 UT/500ML-% IV SOLN
INTRAVENOUS | Status: DC | PRN
  Administered 2021-03-18 (×2): 500 mL

## 2021-03-18 MED ORDER — SODIUM CHLORIDE 0.9 % WEIGHT BASED INFUSION: 1.0000 mL/kg/h | INTRAVENOUS | Status: DC

## 2021-03-18 MED ORDER — HEPARIN SODIUM (PORCINE) 1000 UNIT/ML IJ SOLN
INTRAMUSCULAR | Status: DC | PRN
  Administered 2021-03-18: 4000 [IU] via INTRAVENOUS

## 2021-03-18 MED ORDER — ASPIRIN 81 MG PO CHEW: 81.0000 mg | CHEWABLE_TABLET | ORAL | Status: DC

## 2021-03-18 MED ORDER — LIDOCAINE HCL (PF) 1 % IJ SOLN
INTRAMUSCULAR | Status: DC | PRN
  Administered 2021-03-18: 2 mL

## 2021-03-18 MED ORDER — HEPARIN SODIUM (PORCINE) 1000 UNIT/ML IJ SOLN
INTRAMUSCULAR | Status: AC
  Filled 2021-03-18: qty 1

## 2021-03-18 MED ORDER — LIDOCAINE HCL (PF) 1 % IJ SOLN
INTRAMUSCULAR | Status: AC
  Filled 2021-03-18: qty 30

## 2021-03-18 MED ORDER — SODIUM CHLORIDE 0.9% FLUSH: 3.0000 mL | INTRAVENOUS | Status: DC | PRN

## 2021-03-18 MED ORDER — HEPARIN (PORCINE) IN NACL 1000-0.9 UT/500ML-% IV SOLN
INTRAVENOUS | Status: AC
  Filled 2021-03-18: qty 500

## 2021-03-18 MED ORDER — SODIUM CHLORIDE 0.9 % IV SOLN: 250.0000 mL | INTRAVENOUS | Status: DC | PRN

## 2021-03-18 MED ORDER — IOHEXOL 350 MG/ML SOLN
INTRAVENOUS | Status: DC | PRN
Start: 2021-03-18 — End: 2021-03-18
  Administered 2021-03-18: 50 mL

## 2021-03-18 SURGICAL SUPPLY — 10 items
CATH 5FR JL3.5 JR4 ANG PIG MP (CATHETERS) ×1 IMPLANT
DEVICE RAD COMP TR BAND LRG (VASCULAR PRODUCTS) ×1 IMPLANT
GLIDESHEATH SLEND A-KIT 6F 22G (SHEATH) ×1 IMPLANT
GUIDEWIRE INQWIRE 1.5J.035X260 (WIRE) IMPLANT
INQWIRE 1.5J .035X260CM (WIRE) ×2
KIT HEART LEFT (KITS) ×2 IMPLANT
PACK CARDIAC CATHETERIZATION (CUSTOM PROCEDURE TRAY) ×2 IMPLANT
SHEATH PROBE COVER 6X72 (BAG) ×1 IMPLANT
TRANSDUCER W/STOPCOCK (MISCELLANEOUS) ×2 IMPLANT
TUBING CIL FLEX 10 FLL-RA (TUBING) ×2 IMPLANT

## 2021-03-18 NOTE — Interval H&P Note (Signed)
Cath Lab Visit (complete for each Cath Lab visit)  Clinical Evaluation Leading to the Procedure:   ACS: No.  Non-ACS:    Anginal Classification: No Symptoms  Anti-ischemic medical therapy: No Therapy  Non-Invasive Test Results: Intermediate-risk stress test findings: cardiac mortality 1-3%/year  Prior CABG: No previous CABG      History and Physical Interval Note:  03/18/2021 7:44 AM  [name], MD  has presented today for surgery, with the diagnosis of positive stress test.  The various methods of treatment have been discussed with the patient and family. After consideration of risks, benefits and other options for treatment, the patient has consented to  Procedure(s): LEFT HEART CATH AND CORONARY ANGIOGRAPHY (N/A) as a surgical intervention.  The patient's history has been reviewed, patient examined, no change in status, stable for surgery.  I have reviewed the patient's chart and labs.  Questions were answered to the patient's satisfaction.     Belva Crome III

## 2021-03-18 NOTE — Discharge Instructions (Signed)
Radial Site Care  This sheet gives you information about how to care for yourself after your procedure. Your health care provider may also give you more specific instructions. If you have problems or questions, contact your health care provider. What can I expect after the procedure? After the procedure, it is common to have:  Bruising and tenderness at the catheter insertion area. Follow these instructions at home: Medicines  Take over-the-counter and prescription medicines only as told by your health care provider. Insertion site care  Follow instructions from your health care provider about how to take care of your insertion site. Make sure you: ? Wash your hands with soap and water before you change your bandage (dressing). If soap and water are not available, use hand sanitizer. ? Change your dressing as told by your health care provider. ? Leave stitches (sutures), skin glue, or adhesive strips in place. These skin closures may need to stay in place for 2 weeks or longer. If adhesive strip edges start to loosen and curl up, you may trim the loose edges. Do not remove adhesive strips completely unless your health care provider tells you to do that.  Check your insertion site every day for signs of infection. Check for: ? Redness, swelling, or pain. ? Fluid or blood. ? Pus or a bad smell. ? Warmth.  Do not take baths, swim, or use a hot tub until your health care provider approves.  You may shower 24-48 hours after the procedure, or as directed by your health care provider. ? Remove the dressing and gently wash the site with plain soap and water. ? Pat the area dry with a clean towel. ? Do not rub the site. That could cause bleeding.  Do not apply powder or lotion to the site. Activity  For 24 hours after the procedure, or as directed by your health care provider: ? Do not flex or bend the affected arm. ? Do not push or pull heavy objects with the affected arm. ? Do not drive  yourself home from the hospital or clinic. You may drive 24 hours after the procedure unless your health care provider tells you not to. ? Do not operate machinery or power tools.  Do not lift anything that is heavier than 10 lb (4.5 kg), or the limit that you are told, until your health care provider says that it is safe.  Ask your health care provider when it is okay to: ? Return to work or school. ? Resume usual physical activities or sports. ? Resume sexual activity.   General instructions  If the catheter site starts to bleed, raise your arm and put firm pressure on the site. If the bleeding does not stop, get help right away. This is a medical emergency.  If you went home on the same day as your procedure, a responsible adult should be with you for the first 24 hours after you arrive home.  Keep all follow-up visits as told by your health care provider. This is important. Contact a health care provider if:  You have a fever.  You have redness, swelling, or yellow drainage around your insertion site. Get help right away if:  You have unusual pain at the radial site.  The catheter insertion area swells very fast.  The insertion area is bleeding, and the bleeding does not stop when you hold steady pressure on the area.  Your arm or hand becomes pale, cool, tingly, or numb. These symptoms may represent a serious   problem that is an emergency. Do not wait to see if the symptoms will go away. Get medical help right away. Call your local emergency services (911 in the [place] Do not drive yourself to the hospital. Summary  After the procedure, it is common to have bruising and tenderness at the site.  Follow instructions from your health care provider about how to take care of your radial site wound. Check the wound every day for signs of infection.  Do not lift anything that is heavier than 10 lb (4.5 kg), or the limit that you are told, until your health care provider says that it  is safe. This information is not intended to replace advice given to you by your health care provider. Make sure you discuss any questions you have with your health care provider. Document Revised: 11/17/2017 Document Reviewed: 11/17/2017 Elsevier Patient Education  2021 [employer]vier Inc.  

## 2021-03-18 NOTE — CV Procedure (Signed)
   40-50 percent mid circumflex.  Coronaries otherwise widely patent with luminal irregularities.  LVEDP 18.  EF 60%

## 2021-04-08 NOTE — Progress Notes (Signed)
CARDIOLOGY CONSULT NOTE       Patient ID: Peter PICCIONE, MD MRN: [id number] DOB/AGE: [birth year] 74 y.o.  Admit date: (Not on file) Referring Physician: Ehinger Primary Physician: [name], MD Primary Cardiologist: Johnsie Cancel Reason for Consultation: CAD  Active Problems:   * No active hospital problems. 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 Has seen [facility] in past for ? RVOT PVCls Rx with beta blockers starting in 2016.  TTE in 2016 normal He had a coronary calcium score done as part of Doctor's Day. Score was elevated at 320 which was 11 th percentile for age / sex He then had a ETT 02/25/21 with 2 mm of down-sloping sT depression in leads V4-6 and 2,3,F. Interestingly he did not get chest pain had normal hemodynamics and no arrhythmia  Full cardiac CTA showed likely higher risk obstructive mixed plaque in mid circumflex and ostial plaque in D1D2 small vessels FFR CT was possitive In mid/distal circumflex at 0.78, D1 was 0.89 and D2 not modeled. Also suggested FFR < 0.8 but I only saw moderate mid/distal calcified plaque in this area  Cath done 03/18/21 by Dr Tamala Julian reviewed 40-60% mid circumflex stenosis Normal EF LVEDP 18 mmHg Invasive FFR not done  Doing well no angina compliant with meds Home BP readings ok LDL 72    ROS All other systems reviewed and negative except as noted above  Past Medical History:  Diagnosis Date   Allergic rhinitis    Anxiety    Cancer ([place])    Melanoma -peri umbilical '02 or '03- no further problems. 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    Chest pain    Nuclear 2005, normal / coronary CTA 2008, slight mixed plaque, coronary calcium score 0.65, ejection fraction 55%, echo, March, 6606   Complication of anesthesia    itching-not sure if oxycodone or anesthesia,and shakes. Right TMJ issue  chills  in past   Dizziness    Dizziness with question of presyncope April 08, 2011   DJD (degenerative joint disease)    history DDD, fingers, knees, shoulders   Ejection fraction    EF 55%, echo, March, 2008.   Family history of Alzheimer's disease    GERD (gastroesophageal reflux disease)    Hearing impaired    bilateral hearing aids   Heart murmur    75 yrs old rheumatic fever    Heart murmur 2008   trivial tricusp regurg   History of colonic polyps    History of melanoma    History of pseudogout    History of recent steroid use    10-10-15 tapering steroid use for tx. recent bronchitis.   History of rheumatic fever    HNP (herniated nucleus pulposus), cervical    Hypercholesteremia    borderline   Hypertension    Lumbar spondylosis with myelopathy 06/09/2013   Spondylosis    Tinnitus    chronic   TMJ click    right"was aggravated with last intubation"    Family History  Problem Relation Age of Onset   Alzheimer's disease Father    Alzheimer's disease Mother     Social History   Socioeconomic History   Marital status: Married    Spouse name: [name]   Number of children: 2   Years of education: MD   Highest education level: Not  on file  Occupational History   Occupation: RADIOLOGIST    Employer: [employer]  Tobacco Use   Smoking status: Never   Smokeless tobacco: Never  Vaping Use   Vaping Use: Never used  Substance and Sexual Activity   Alcohol use: Yes    Alcohol/week: 2.0 - 4.0 standard drinks    Types: 2 - 4 Glasses of wine per week    Comment: 3 x a week   Drug use: No   Sexual activity: Yes  Other Topics Concern   Not on file  Social History Narrative   Married - wife = [name]   2 children- Daughter=lawyer, Son=physician   Non smoker   Exercises 3 times per week   Caffeine use: 2 cups/d   etoh use: 1-2 glasses of wine nightly   Social Determinants of Health   Financial Resource Strain: Not on file  Food Insecurity: Not on file   Transportation Needs: Not on file  Physical Activity: Not on file  Stress: Not on file  Social Connections: Not on file  Intimate Partner Violence: Not on file    Past Surgical History:  Procedure Laterality Date   ANTERIOR CERVICAL DECOMP/DISCECTOMY FUSION  2003   ANTERIOR CERVICAL DECOMP/DISCECTOMY FUSION N/A 05/18/2018   Procedure: ANTERIOR CERVICAL DECOMPRESSION/DISCECTOMY FUSION - CERVICAL FIVE-CERVICAL SIX, removal of tether plate;  Surgeon: Jovita Gamma, MD;  Location: [facility];  Service: Neurosurgery;  Laterality: N/A;   BACK SURGERY     x3   CARPAL TUNNEL RELEASE Right 10/31/2013   Procedure: RIGHT CARPAL TUNNEL RELEASE;  Surgeon: Cammie Sickle., MD;  Location: [facility];  Service: Orthopedics;  Laterality: Right;   CARPAL TUNNEL RELEASE Left 11/28/2013   Procedure: LEFT CARPAL TUNNEL RELEASE;  Surgeon: Cammie Sickle., MD;  Location: [facility];  Service: Orthopedics;  Laterality: Left;   CERVICAL DISCECTOMY     anterior with patellar allograft and plating   JOINT REPLACEMENT     right knee 3 years ago   LEFT HEART CATH AND CORONARY ANGIOGRAPHY N/A 03/18/2021   Procedure: LEFT HEART CATH AND CORONARY ANGIOGRAPHY;  Surgeon: Belva Crome, MD;  Location: [facility];  Service: Cardiovascular;  Laterality: N/A;   LUMBAR FUSION  12/14   MELANOMA EXCISION WITH SENTINEL LYMPH NODE BIOPSY  8/02   bilat ing node bx   ROTATOR CUFF REPAIR Left    08/13/11  08/27/2011   TOTAL KNEE ARTHROPLASTY Right 10/07/2015   Procedure: RIGHT TOTAL KNEE ARTHROPLASTY, with synovial tissue specimen;  Surgeon: [name], MD;  Location: [facility];  Service: Orthopedics;  Laterality: Right;   TOTAL KNEE ARTHROPLASTY Left 06/16/2019   Procedure: LEFT TOTAL KNEE ARTHROPLASTY;  Surgeon: Mcarthur Rossetti, MD;  Location: [facility];  Service: Orthopedics;  Laterality: Left;      Current Outpatient Medications:    alfuzosin (UROXATRAL) 10 MG 24 hr tablet,  Take 10 mg by mouth every evening. , Disp: , Rfl:    amLODipine (NORVASC) 5 MG tablet, Take 1 tablet (5 mg total) by mouth as directed. (Patient taking differently: Take 5 mg by mouth in the morning.), Disp: 90 tablet, Rfl: 1   aspirin EC 81 MG tablet, Take 81 mg by mouth at bedtime. Swallow whole., Disp: , Rfl:    B Complex-C (B-COMPLEX WITH VITAMIN C) tablet, Take 1 tablet by mouth in the morning., Disp: , Rfl:    celecoxib (CELEBREX) 100 MG capsule, Take 1 capsule (100 mg total)  by mouth 2 (two) times daily as needed. (Patient taking differently: Take 100 mg by mouth in the morning.), Disp: 60 capsule, Rfl: 3   Cholecalciferol (VITAMIN D3) 50 MCG (2000 UT) TABS, Take 2,000 Units by mouth in the morning., Disp: , Rfl:    cycloSPORINE (RESTASIS) 0.05 % ophthalmic emulsion, Place 1 drop into both eyes in the morning., Disp: , Rfl:    fluticasone (CUTIVATE) 0.05 % cream, Apply 1 application topically 2 (two) times daily., Disp: , Rfl:    montelukast (SINGULAIR) 10 MG tablet, TAKE 1 TABLET BY MOUTH EVERYDAY AT BEDTIME (Patient taking differently: Take 10 mg by mouth at bedtime.), Disp: 90 tablet, Rfl: 0   pantoprazole (PROTONIX) 40 MG tablet, TAKE 1 TABLET BY MOUTH EVERY DAY 30 MINUTES BEFORE EVENING MEALS (Patient taking differently: Take 40 mg by mouth daily before supper.), Disp: 30 tablet, Rfl: 0   Probiotic Product (ALIGN) 4 MG CAPS, Take 4 mg by mouth in the morning., Disp: , Rfl:    rosuvastatin (CRESTOR) 10 MG tablet, Take 1 tablet (10 mg total) by mouth at bedtime., Disp: 90 tablet, Rfl: 3   Sulfacetamide Sodium, Acne, 10 % LOTN, Apply 1 application topically in the morning., Disp: , Rfl:    traMADol (ULTRAM) 50 MG tablet, Take 50 mg by mouth at bedtime., Disp: , Rfl:     Physical Exam: Blood pressure 138/78, pulse 74, height 5\' 9"  (1.753 m), weight 80.7 kg, SpO2 98 %.   Affect appropriate Healthy:  appears stated age 24: normal Neck supple with no adenopathy JVP normal no bruits no  thyromegaly Lungs clear with no wheezing and good diaphragmatic motion Heart:  S1/S2 no murmur, no rub, gallop or click PMI normal Abdomen: benighn, BS positve, no tenderness, no AAA no bruit.  No HSM or HJR Distal pulses intact with no bruits No edema Neuro non-focal Skin warm and dry No muscular weakness   Labs:   Lab Results  Component Value Date   WBC 8.9 03/14/2021   HGB 16.4 03/14/2021   HCT 49.6 03/14/2021   MCV 92 03/14/2021   PLT 288 03/14/2021   No results for input(s): NA, K, CL, CO2, BUN, CREATININE, CALCIUM, PROT, BILITOT, ALKPHOS, ALT, AST, GLUCOSE in the last 168 hours.  Invalid input(s): LABALBU No results found for: CKTOTAL, CKMB, CKMBINDEX, TROPONINI  Lab Results  Component Value Date   CHOL 228 (H) 04/10/2011   CHOL 171 07/16/2008   Lab Results  Component Value Date   HDL 57.70 04/10/2011   HDL 55.8 07/16/2008   Lab Results  Component Value Date   LDLCALC 104 (H) 07/16/2008   Lab Results  Component Value Date   TRIG 53.0 04/10/2011   TRIG 54 07/16/2008   Lab Results  Component Value Date   CHOLHDL 4 04/10/2011   CHOLHDL 3.1 CALC 07/16/2008   Lab Results  Component Value Date   LDLDIRECT 151.6 04/10/2011      Radiology: CARDIAC CATHETERIZATION  Result Date: 03/18/2021  40 to 60% mid circumflex.  Otherwise widely patent vessel.  Short left main, widely patent.  LAD is somewhat tortuous but widely patent.  Minimal plaquing is noted in the diagonal and LAD proper.  Right coronary is dominant has minimal luminal irregularities and is widely patent.  LVEF 55%.  LVEDP 18 mmHg. RECOMMENDATIONS:  Nonobstructive CAD.  Aggressive preventive therapy aiming for LDL less than 70.  Daily 81 mg aspirin.  Tight blood pressure control and glycemic control.    EKG: NSR normal  ECG    ASSESSMENT AND PLAN:   1.  CAD:  Detected on calcium screening CT 02/17/21 Score 320 9 th percentile Suggested hemodynamically significant circumflex lesion On non  invasive HeartFlow FFR Cath done 03/18/21 with 50% mid circumflex lesion non obstructive Risk factor modification ASA/statin   2. HTN:  Well controlled.  Continue current medications and low sodium Dash type diet.    3. HLD on crestor LDL 72 02/05/21   Lipid and liver in 3 months  F/U in a year   Signed: [name] 04/15/2021, 10:57 AM

## 2021-04-15 ENCOUNTER — Encounter: Payer: Self-pay | Admitting: Cardiovascular Disease

## 2021-04-15 ENCOUNTER — Ambulatory Visit (INDEPENDENT_AMBULATORY_CARE_PROVIDER_SITE_OTHER): Payer: Medicare Other | Admitting: Cardiovascular Disease

## 2021-04-15 ENCOUNTER — Other Ambulatory Visit: Payer: Self-pay

## 2021-04-15 VITALS — BP 138/78 | HR 74 | Ht 69.0 in | Wt 178.0 lb

## 2021-04-15 DIAGNOSIS — I2510 Atherosclerotic heart disease of native coronary artery without angina pectoris: Secondary | ICD-10-CM

## 2021-04-15 DIAGNOSIS — I2584 Coronary atherosclerosis due to calcified coronary lesion: Secondary | ICD-10-CM

## 2021-04-15 DIAGNOSIS — E782 Mixed hyperlipidemia: Secondary | ICD-10-CM

## 2021-04-15 DIAGNOSIS — I10 Essential (primary) hypertension: Secondary | ICD-10-CM | POA: Diagnosis not present

## 2021-04-15 NOTE — Patient Instructions (Signed)
Medication Instructions:  *If you need a refill on your cardiac medications before your next appointment, please call your pharmacy*  Lab Work: If you have labs (blood work) drawn today and your tests are completely normal, you will receive your results only by: [facility] (if you have MyChart) OR A paper copy in the mail If you have any lab test that is abnormal or we need to change your treatment, we will call you to review the results.  Follow-Up: At [facility], you and your health needs are our priority.  As part of our continuing mission to provide you with exceptional heart care, we have created designated Provider Care Teams.  These Care Teams include your primary Cardiologist (physician) and Advanced Practice Providers (APPs -  Physician Assistants and Nurse Practitioners) who all work together to provide you with the care you need, when you need it.  We recommend signing up for the patient portal called "MyChart".  Sign up information is provided on this After Visit Summary.  MyChart is used to connect with patients for Virtual Visits (Telemedicine).  Patients are able to view lab/test results, encounter notes, upcoming appointments, etc.  Non-urgent messages can be sent to your provider as well.   To learn more about what you can do with MyChart, go to [email or web address].    Your next appointment:   1 year(s)  The format for your next appointment:   In Person  Provider:   You may see Dr. Johnsie Cancel or one of the following Advanced Practice Providers on your designated Care Team:   Kathyrn Drown, NP

## 2021-05-05 DIAGNOSIS — K219 Gastro-esophageal reflux disease without esophagitis: Secondary | ICD-10-CM | POA: Diagnosis not present

## 2021-05-05 DIAGNOSIS — Z800 Family history of malignant neoplasm of digestive organs: Secondary | ICD-10-CM | POA: Diagnosis not present

## 2021-05-05 DIAGNOSIS — K449 Diaphragmatic hernia without obstruction or gangrene: Secondary | ICD-10-CM | POA: Diagnosis not present

## 2021-05-05 DIAGNOSIS — K2270 Barrett's esophagus without dysplasia: Secondary | ICD-10-CM | POA: Diagnosis not present

## 2021-05-05 DIAGNOSIS — K317 Polyp of stomach and duodenum: Secondary | ICD-10-CM | POA: Diagnosis not present

## 2021-06-09 ENCOUNTER — Telehealth: Payer: Self-pay | Admitting: Cardiovascular Disease

## 2021-06-09 NOTE — Telephone Encounter (Signed)
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 Johnsie Cancel, he was asked to get bloodwork at his PCP office. Pt has an appt on 07/07/21 with Dr. Marisue Humble (PCP), he wants to know if the lab order can be put in before then so his PCP office can do his labs

## 2021-06-09 NOTE — Telephone Encounter (Signed)
Called patient to let him know Dr. Andrew Au office is aware of needing lipid and liver panel. Dr. Andrew Au office will fax our office a copy of results. Patient verbalized understanding.

## 2021-06-10 ENCOUNTER — Ambulatory Visit: Payer: Medicare Other | Admitting: Cardiovascular Disease

## 2021-06-12 DIAGNOSIS — Z4789 Encounter for other orthopedic aftercare: Secondary | ICD-10-CM | POA: Diagnosis not present

## 2021-06-12 DIAGNOSIS — M65341 Trigger finger, right ring finger: Secondary | ICD-10-CM | POA: Diagnosis not present

## 2021-06-12 DIAGNOSIS — M65321 Trigger finger, right index finger: Secondary | ICD-10-CM | POA: Diagnosis not present

## 2021-06-26 DIAGNOSIS — M25641 Stiffness of right hand, not elsewhere classified: Secondary | ICD-10-CM | POA: Diagnosis not present

## 2021-06-26 DIAGNOSIS — L111 Transient acantholytic dermatosis [Grover]: Secondary | ICD-10-CM | POA: Diagnosis not present

## 2021-06-26 DIAGNOSIS — Z85820 Personal history of malignant melanoma of skin: Secondary | ICD-10-CM | POA: Diagnosis not present

## 2021-06-26 DIAGNOSIS — D2239 Melanocytic nevi of other parts of face: Secondary | ICD-10-CM | POA: Diagnosis not present

## 2021-06-26 DIAGNOSIS — D485 Neoplasm of uncertain behavior of skin: Secondary | ICD-10-CM | POA: Diagnosis not present

## 2021-06-26 DIAGNOSIS — L84 Corns and callosities: Secondary | ICD-10-CM | POA: Diagnosis not present

## 2021-06-26 DIAGNOSIS — L821 Other seborrheic keratosis: Secondary | ICD-10-CM | POA: Diagnosis not present

## 2021-06-26 DIAGNOSIS — Z85828 Personal history of other malignant neoplasm of skin: Secondary | ICD-10-CM | POA: Diagnosis not present

## 2021-07-03 DIAGNOSIS — M25642 Stiffness of left hand, not elsewhere classified: Secondary | ICD-10-CM | POA: Diagnosis not present

## 2021-07-07 DIAGNOSIS — E7800 Pure hypercholesterolemia, unspecified: Secondary | ICD-10-CM | POA: Diagnosis not present

## 2021-07-07 DIAGNOSIS — Z79899 Other long term (current) drug therapy: Secondary | ICD-10-CM | POA: Diagnosis not present

## 2021-07-15 DIAGNOSIS — M25642 Stiffness of left hand, not elsewhere classified: Secondary | ICD-10-CM | POA: Diagnosis not present

## 2021-07-22 DIAGNOSIS — Z23 Encounter for immunization: Secondary | ICD-10-CM | POA: Diagnosis not present

## 2021-08-01 DIAGNOSIS — M25642 Stiffness of left hand, not elsewhere classified: Secondary | ICD-10-CM | POA: Diagnosis not present

## 2021-08-14 DIAGNOSIS — M25642 Stiffness of left hand, not elsewhere classified: Secondary | ICD-10-CM | POA: Diagnosis not present

## 2021-08-20 DIAGNOSIS — D2360 Other benign neoplasm of skin of unspecified upper limb, including shoulder: Secondary | ICD-10-CM | POA: Diagnosis not present

## 2021-08-20 DIAGNOSIS — Z4789 Encounter for other orthopedic aftercare: Secondary | ICD-10-CM | POA: Diagnosis not present

## 2021-08-20 DIAGNOSIS — M65321 Trigger finger, right index finger: Secondary | ICD-10-CM | POA: Diagnosis not present

## 2021-08-20 DIAGNOSIS — M25642 Stiffness of left hand, not elsewhere classified: Secondary | ICD-10-CM | POA: Diagnosis not present

## 2021-08-22 DIAGNOSIS — H903 Sensorineural hearing loss, bilateral: Secondary | ICD-10-CM | POA: Diagnosis not present

## 2021-08-22 DIAGNOSIS — H6123 Impacted cerumen, bilateral: Secondary | ICD-10-CM | POA: Diagnosis not present

## 2021-08-22 DIAGNOSIS — M1120 Other chondrocalcinosis, unspecified site: Secondary | ICD-10-CM | POA: Diagnosis not present

## 2021-08-29 DIAGNOSIS — M25642 Stiffness of left hand, not elsewhere classified: Secondary | ICD-10-CM | POA: Diagnosis not present

## 2021-09-01 DIAGNOSIS — M25642 Stiffness of left hand, not elsewhere classified: Secondary | ICD-10-CM | POA: Diagnosis not present

## 2021-09-02 DIAGNOSIS — H43813 Vitreous degeneration, bilateral: Secondary | ICD-10-CM | POA: Diagnosis not present

## 2021-09-02 DIAGNOSIS — H04123 Dry eye syndrome of bilateral lacrimal glands: Secondary | ICD-10-CM | POA: Diagnosis not present

## 2021-09-02 DIAGNOSIS — H2512 Age-related nuclear cataract, left eye: Secondary | ICD-10-CM | POA: Diagnosis not present

## 2021-09-02 DIAGNOSIS — H25811 Combined forms of age-related cataract, right eye: Secondary | ICD-10-CM | POA: Diagnosis not present

## 2021-09-11 DIAGNOSIS — M25642 Stiffness of left hand, not elsewhere classified: Secondary | ICD-10-CM | POA: Diagnosis not present

## 2021-09-11 DIAGNOSIS — I10 Essential (primary) hypertension: Secondary | ICD-10-CM | POA: Diagnosis not present

## 2021-09-11 DIAGNOSIS — Z85820 Personal history of malignant melanoma of skin: Secondary | ICD-10-CM | POA: Diagnosis not present

## 2021-09-11 DIAGNOSIS — K2100 Gastro-esophageal reflux disease with esophagitis, without bleeding: Secondary | ICD-10-CM | POA: Diagnosis not present

## 2021-09-11 DIAGNOSIS — K2270 Barrett's esophagus without dysplasia: Secondary | ICD-10-CM | POA: Diagnosis not present

## 2021-09-11 DIAGNOSIS — G629 Polyneuropathy, unspecified: Secondary | ICD-10-CM | POA: Diagnosis not present

## 2021-09-11 DIAGNOSIS — Z8679 Personal history of other diseases of the circulatory system: Secondary | ICD-10-CM | POA: Diagnosis not present

## 2021-09-11 DIAGNOSIS — M1180 Other specified crystal arthropathies, unspecified site: Secondary | ICD-10-CM | POA: Diagnosis not present

## 2021-09-11 DIAGNOSIS — N400 Enlarged prostate without lower urinary tract symptoms: Secondary | ICD-10-CM | POA: Diagnosis not present

## 2021-09-11 DIAGNOSIS — F325 Major depressive disorder, single episode, in full remission: Secondary | ICD-10-CM | POA: Diagnosis not present

## 2021-09-11 DIAGNOSIS — M1990 Unspecified osteoarthritis, unspecified site: Secondary | ICD-10-CM | POA: Diagnosis not present

## 2021-09-11 DIAGNOSIS — E7800 Pure hypercholesterolemia, unspecified: Secondary | ICD-10-CM | POA: Diagnosis not present

## 2021-09-11 DIAGNOSIS — I2510 Atherosclerotic heart disease of native coronary artery without angina pectoris: Secondary | ICD-10-CM | POA: Diagnosis not present

## 2021-09-22 DIAGNOSIS — D2360 Other benign neoplasm of skin of unspecified upper limb, including shoulder: Secondary | ICD-10-CM | POA: Diagnosis not present

## 2021-09-22 DIAGNOSIS — M65321 Trigger finger, right index finger: Secondary | ICD-10-CM | POA: Diagnosis not present

## 2021-10-15 DIAGNOSIS — L821 Other seborrheic keratosis: Secondary | ICD-10-CM | POA: Diagnosis not present

## 2021-10-15 DIAGNOSIS — L720 Epidermal cyst: Secondary | ICD-10-CM | POA: Diagnosis not present

## 2021-10-15 DIAGNOSIS — Z85828 Personal history of other malignant neoplasm of skin: Secondary | ICD-10-CM | POA: Diagnosis not present

## 2021-10-15 DIAGNOSIS — Z85820 Personal history of malignant melanoma of skin: Secondary | ICD-10-CM | POA: Diagnosis not present

## 2021-11-17 ENCOUNTER — Other Ambulatory Visit: Payer: Self-pay | Admitting: Orthopedic Surgery

## 2021-11-17 DIAGNOSIS — Z4789 Encounter for other orthopedic aftercare: Secondary | ICD-10-CM | POA: Diagnosis not present

## 2021-11-17 DIAGNOSIS — M25512 Pain in left shoulder: Secondary | ICD-10-CM | POA: Diagnosis not present

## 2021-11-17 DIAGNOSIS — R2232 Localized swelling, mass and lump, left upper limb: Secondary | ICD-10-CM | POA: Diagnosis not present

## 2021-11-17 DIAGNOSIS — S40252A Superficial foreign body of left shoulder, initial encounter: Secondary | ICD-10-CM | POA: Diagnosis not present

## 2021-11-21 ENCOUNTER — Other Ambulatory Visit: Payer: Self-pay | Admitting: Orthopaedic Surgery

## 2021-11-21 DIAGNOSIS — M25552 Pain in left hip: Secondary | ICD-10-CM

## 2021-12-24 DIAGNOSIS — L111 Transient acantholytic dermatosis [Grover]: Secondary | ICD-10-CM | POA: Diagnosis not present

## 2021-12-24 DIAGNOSIS — Z85828 Personal history of other malignant neoplasm of skin: Secondary | ICD-10-CM | POA: Diagnosis not present

## 2021-12-24 DIAGNOSIS — L821 Other seborrheic keratosis: Secondary | ICD-10-CM | POA: Diagnosis not present

## 2021-12-24 DIAGNOSIS — Z85820 Personal history of malignant melanoma of skin: Secondary | ICD-10-CM | POA: Diagnosis not present

## 2021-12-24 DIAGNOSIS — L570 Actinic keratosis: Secondary | ICD-10-CM | POA: Diagnosis not present

## 2022-02-02 DIAGNOSIS — U071 COVID-19: Secondary | ICD-10-CM | POA: Diagnosis not present

## 2022-02-09 ENCOUNTER — Encounter: Payer: Self-pay | Admitting: Cardiovascular Disease

## 2022-02-10 ENCOUNTER — Encounter: Payer: Self-pay | Admitting: Cardiovascular Disease

## 2022-02-10 MED ORDER — AMLODIPINE BESYLATE 10 MG PO TABS: 10.0000 mg | ORAL_TABLET | Freq: Every day | ORAL | 3 refills | Status: DC

## 2022-02-10 NOTE — Telephone Encounter (Signed)
Spoke with patient and new RX sent into pharmacy of request ([facility]). ?

## 2022-02-17 ENCOUNTER — Encounter: Payer: Self-pay | Admitting: Cardiovascular Disease

## 2022-02-17 DIAGNOSIS — Z79899 Other long term (current) drug therapy: Secondary | ICD-10-CM

## 2022-02-17 DIAGNOSIS — I10 Essential (primary) hypertension: Secondary | ICD-10-CM

## 2022-02-17 MED ORDER — LOSARTAN POTASSIUM 50 MG PO TABS: 50.0000 mg | ORAL_TABLET | Freq: Every day | ORAL | 3 refills | Status: DC

## 2022-02-18 ENCOUNTER — Encounter: Payer: Self-pay | Admitting: Cardiovascular Disease

## 2022-02-19 DIAGNOSIS — R2689 Other abnormalities of gait and mobility: Secondary | ICD-10-CM | POA: Diagnosis not present

## 2022-02-20 DIAGNOSIS — H903 Sensorineural hearing loss, bilateral: Secondary | ICD-10-CM | POA: Diagnosis not present

## 2022-02-20 DIAGNOSIS — R2689 Other abnormalities of gait and mobility: Secondary | ICD-10-CM | POA: Diagnosis not present

## 2022-02-20 DIAGNOSIS — M1180 Other specified crystal arthropathies, unspecified site: Secondary | ICD-10-CM | POA: Diagnosis not present

## 2022-02-20 DIAGNOSIS — Z974 Presence of external hearing-aid: Secondary | ICD-10-CM | POA: Diagnosis not present

## 2022-02-20 DIAGNOSIS — G629 Polyneuropathy, unspecified: Secondary | ICD-10-CM | POA: Diagnosis not present

## 2022-02-20 DIAGNOSIS — M1120 Other chondrocalcinosis, unspecified site: Secondary | ICD-10-CM | POA: Diagnosis not present

## 2022-02-20 DIAGNOSIS — G588 Other specified mononeuropathies: Secondary | ICD-10-CM | POA: Diagnosis not present

## 2022-02-20 DIAGNOSIS — H8103 Meniere's disease, bilateral: Secondary | ICD-10-CM | POA: Diagnosis not present

## 2022-02-20 DIAGNOSIS — H9313 Tinnitus, bilateral: Secondary | ICD-10-CM | POA: Diagnosis not present

## 2022-02-20 DIAGNOSIS — Z9229 Personal history of other drug therapy: Secondary | ICD-10-CM | POA: Diagnosis not present

## 2022-02-27 ENCOUNTER — Other Ambulatory Visit: Payer: Medicare Other | Admitting: *Deleted

## 2022-02-27 DIAGNOSIS — Z79899 Other long term (current) drug therapy: Secondary | ICD-10-CM

## 2022-02-27 DIAGNOSIS — I10 Essential (primary) hypertension: Secondary | ICD-10-CM | POA: Diagnosis not present

## 2022-02-27 LAB — BASIC METABOLIC PANEL
BUN/Creatinine Ratio: 15 (ref 10–24)
BUN: 18 mg/dL (ref 8–27)
CO2: 24 mmol/L (ref 20–29)
Calcium: 10 mg/dL (ref 8.6–10.2)
Chloride: 101 mmol/L (ref 96–106)
Creatinine, Ser: 1.2 mg/dL (ref 0.76–1.27)
Glucose: 121 mg/dL — ABNORMAL HIGH (ref 70–99)
Potassium: 5.3 mmol/L — ABNORMAL HIGH (ref 3.5–5.2)
Sodium: 139 mmol/L (ref 134–144)
eGFR: 63 mL/min/{1.73_m2} (ref >59)

## 2022-02-28 ENCOUNTER — Encounter: Payer: Self-pay | Admitting: Cardiovascular Disease

## 2022-02-28 DIAGNOSIS — E875 Hyperkalemia: Secondary | ICD-10-CM

## 2022-02-28 DIAGNOSIS — Z79899 Other long term (current) drug therapy: Secondary | ICD-10-CM

## 2022-02-28 DIAGNOSIS — I10 Essential (primary) hypertension: Secondary | ICD-10-CM

## 2022-03-02 MED ORDER — LOSARTAN POTASSIUM 25 MG PO TABS: 25.0000 mg | ORAL_TABLET | Freq: Every day | ORAL | 3 refills | Status: DC

## 2022-03-02 NOTE — Telephone Encounter (Signed)
Since patient is unable to come in to get lab work today before he leaves for his trip, Dr. Marlou Porch advised that his Losartan can be reduced to 25 mg daily and we can recheck lab work when patient returns on 03/13/21.  ?

## 2022-03-13 ENCOUNTER — Other Ambulatory Visit: Payer: Medicare Other | Admitting: *Deleted

## 2022-03-13 DIAGNOSIS — E875 Hyperkalemia: Secondary | ICD-10-CM

## 2022-03-13 DIAGNOSIS — Z79899 Other long term (current) drug therapy: Secondary | ICD-10-CM

## 2022-03-13 DIAGNOSIS — I10 Essential (primary) hypertension: Secondary | ICD-10-CM

## 2022-03-13 LAB — BASIC METABOLIC PANEL
BUN/Creatinine Ratio: 13 (ref 10–24)
BUN: 15 mg/dL (ref 8–27)
CO2: 25 mmol/L (ref 20–29)
Calcium: 10.5 mg/dL — ABNORMAL HIGH (ref 8.6–10.2)
Chloride: 100 mmol/L (ref 96–106)
Creatinine, Ser: 1.12 mg/dL (ref 0.76–1.27)
Glucose: 94 mg/dL (ref 70–99)
Potassium: 5.6 mmol/L — ABNORMAL HIGH (ref 3.5–5.2)
Sodium: 140 mmol/L (ref 134–144)
eGFR: 69 mL/min/{1.73_m2} (ref >59)

## 2022-03-14 ENCOUNTER — Encounter: Payer: Self-pay | Admitting: Cardiovascular Disease

## 2022-03-14 DIAGNOSIS — Z79899 Other long term (current) drug therapy: Secondary | ICD-10-CM

## 2022-03-14 DIAGNOSIS — E875 Hyperkalemia: Secondary | ICD-10-CM

## 2022-03-16 MED ORDER — HYDROCHLOROTHIAZIDE 12.5 MG PO CAPS: 12.5000 mg | ORAL_CAPSULE | Freq: Every day | ORAL | 3 refills | Status: DC

## 2022-03-16 NOTE — Telephone Encounter (Signed)
Called patient and let him know that there is not a hyzaar in 25/12.5 mg dose, so we sent in just the HCTZ 12.5 mg daily. Patient will come in at 4 weeks to check his BMET. Scheduled patient for lab work. Patient will continue to check his BP.

## 2022-03-20 DIAGNOSIS — Z23 Encounter for immunization: Secondary | ICD-10-CM | POA: Diagnosis not present

## 2022-04-08 NOTE — Progress Notes (Unsigned)
CARDIOLOGY CONSULT NOTE       Patient ID: Peter COKER, MD MRN: [id number] DOB/AGE: 74-Jun-1947 74 y.o.  Admit date: (Not on file) Referring Physician: Ehinger Primary Physician: [name], MD Primary Cardiologist: Johnsie Cancel Reason for Consultation: CAD  Active Problems:   * No active hospital problems. 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 Has seen [facility] in past for ? RVOT PVCls Rx with beta blockers starting in 2016.  TTE in 2016 normal He had a coronary calcium score done as part of Doctor's Day. Score was elevated at 320 which was 22 th percentile for age / sex He then had a ETT 02/25/21 with 2 mm of down-sloping sT depression in leads V4-6 and 2,3,F. Interestingly he did not get chest pain had normal hemodynamics and no arrhythmia  Full cardiac CTA showed likely higher risk obstructive mixed plaque in mid circumflex and ostial plaque in D1D2 small vessels FFR CT was possitive In mid/distal circumflex at 0.78, D1 was 0.89 and D2 not modeled. Also suggested FFR < 0.8 but I only saw moderate mid/distal calcified plaque in this area  Cath done 03/18/21 by Dr Tamala Julian reviewed 40-60% mid circumflex stenosis Normal EF LVEDP 18 mmHg Invasive FFR not done  Doing well no angina compliant with meds  BP better on diuretic Meds now include  Norvasc 10 mg HCTZ 12.5 mg Losartan 25 mg   K elevated 5.6 and Cr 1.12 on 03/13/22   Has had left rotator cuff repair x 2 Bilateral carpal tunnel left TKR and melanoma resection. Past 18 months Wearing hearing aids   ***  ROS All other systems reviewed and negative except as noted above  Past Medical History:  Diagnosis Date   Allergic rhinitis    Anxiety    Cancer ([place])    Melanoma -peri umbilical '02 or '03- no further problems. 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    Chest pain     Nuclear 2005, normal / coronary CTA 2008, slight mixed plaque, coronary calcium score 0.65, ejection fraction 55%, echo, March, 6213   Complication of anesthesia    itching-not sure if oxycodone or anesthesia,and shakes. Right TMJ issue  chills in past   Dizziness    Dizziness with question of presyncope April 08, 2011   DJD (degenerative joint disease)    history DDD, fingers, knees, shoulders   Ejection fraction    EF 55%, echo, March, 2008.   Family history of Alzheimer's disease    GERD (gastroesophageal reflux disease)    Hearing impaired    bilateral hearing aids   Heart murmur    76 yrs old rheumatic fever    Heart murmur 2008   trivial tricusp regurg   History of colonic polyps    History of melanoma    History of pseudogout    History of recent steroid use    10-10-15 tapering steroid use for tx. recent bronchitis.   History of rheumatic fever    HNP (herniated nucleus pulposus), cervical    Hypercholesteremia    borderline   Hypertension    Lumbar spondylosis with myelopathy 06/09/2013   Spondylosis    Tinnitus    chronic   TMJ click    right"was aggravated with last intubation"    Family History  Problem Relation Age of Onset   Alzheimer's  disease Father    Alzheimer's disease Mother     Social History   Socioeconomic History   Marital status: Married    Spouse name: [name]   Number of children: 2   Years of education: MD   Highest education level: Not on file  Occupational History   Occupation: RADIOLOGIST    Employer: [employer]  Tobacco Use   Smoking status: Never   Smokeless tobacco: Never  Vaping Use   Vaping Use: Never used  Substance and Sexual Activity   Alcohol use: Yes    Alcohol/week: 2.0 - 4.0 standard drinks of alcohol    Types: 2 - 4 Glasses of wine per week    Comment: 3 x a week   Drug use: No   Sexual activity: Yes  Other Topics Concern   Not on file  Social History Narrative   Married - wife = [name]   2 children-  Daughter=lawyer, Son=physician   Non smoker   Exercises 3 times per week   Caffeine use: 2 cups/d   etoh use: 1-2 glasses of wine nightly   Social Determinants of Health   Financial Resource Strain: Not on file  Food Insecurity: Not on file  Transportation Needs: Not on file  Physical Activity: Not on file  Stress: Not on file  Social Connections: Not on file  Intimate Partner Violence: Not on file    Past Surgical History:  Procedure Laterality Date   ANTERIOR CERVICAL DECOMP/DISCECTOMY FUSION  2003   ANTERIOR CERVICAL DECOMP/DISCECTOMY FUSION N/A 05/18/2018   Procedure: ANTERIOR CERVICAL DECOMPRESSION/DISCECTOMY FUSION - CERVICAL FIVE-CERVICAL SIX, removal of tether plate;  Surgeon: Jovita Gamma, MD;  Location: [facility];  Service: Neurosurgery;  Laterality: N/A;   BACK SURGERY     x3   CARPAL TUNNEL RELEASE Right 10/31/2013   Procedure: RIGHT CARPAL TUNNEL RELEASE;  Surgeon: Cammie Sickle., MD;  Location: [facility];  Service: Orthopedics;  Laterality: Right;   CARPAL TUNNEL RELEASE Left 11/28/2013   Procedure: LEFT CARPAL TUNNEL RELEASE;  Surgeon: Cammie Sickle., MD;  Location: [facility];  Service: Orthopedics;  Laterality: Left;   CERVICAL DISCECTOMY     anterior with patellar allograft and plating   JOINT REPLACEMENT     right knee 3 years ago   LEFT HEART CATH AND CORONARY ANGIOGRAPHY N/A 03/18/2021   Procedure: LEFT HEART CATH AND CORONARY ANGIOGRAPHY;  Surgeon: Belva Crome, MD;  Location: [facility];  Service: Cardiovascular;  Laterality: N/A;   LUMBAR FUSION  12/14   MELANOMA EXCISION WITH SENTINEL LYMPH NODE BIOPSY  8/02   bilat ing node bx   ROTATOR CUFF REPAIR Left    08/13/11  08/27/2011   TOTAL KNEE ARTHROPLASTY Right 10/07/2015   Procedure: RIGHT TOTAL KNEE ARTHROPLASTY, with synovial tissue specimen;  Surgeon: [name], MD;  Location: [facility];  Service: Orthopedics;  Laterality: Right;   TOTAL KNEE ARTHROPLASTY  Left 06/16/2019   Procedure: LEFT TOTAL KNEE ARTHROPLASTY;  Surgeon: Mcarthur Rossetti, MD;  Location: [facility];  Service: Orthopedics;  Laterality: Left;      Current Outpatient Medications:    alfuzosin (UROXATRAL) 10 MG 24 hr tablet, Take 10 mg by mouth every evening. , Disp: , Rfl:    amLODipine (NORVASC) 10 MG tablet, Take 1 tablet (10 mg total) by mouth daily., Disp: 90 tablet, Rfl: 3   aspirin EC 81 MG tablet, Take 81 mg by mouth at bedtime. Swallow whole., Disp: ,  Rfl:    B Complex-C (B-COMPLEX WITH VITAMIN C) tablet, Take 1 tablet by mouth in the morning., Disp: , Rfl:    celecoxib (CELEBREX) 100 MG capsule, Take 1 capsule (100 mg total) by mouth 2 (two) times daily as needed. (Patient taking differently: Take 100 mg by mouth in the morning.), Disp: 60 capsule, Rfl: 3   Cholecalciferol (VITAMIN D3) 50 MCG (2000 UT) TABS, Take 2,000 Units by mouth in the morning., Disp: , Rfl:    cycloSPORINE (RESTASIS) 0.05 % ophthalmic emulsion, Place 1 drop into both eyes in the morning., Disp: , Rfl:    fluticasone (CUTIVATE) 0.05 % cream, Apply 1 application topically 2 (two) times daily., Disp: , Rfl:    hydrochlorothiazide (MICROZIDE) 12.5 MG capsule, Take 1 capsule (12.5 mg total) by mouth daily., Disp: 90 capsule, Rfl: 3   losartan (COZAAR) 25 MG tablet, Take 1 tablet (25 mg total) by mouth daily., Disp: 90 tablet, Rfl: 3   montelukast (SINGULAIR) 10 MG tablet, TAKE 1 TABLET BY MOUTH EVERYDAY AT BEDTIME (Patient taking differently: Take 10 mg by mouth at bedtime.), Disp: 90 tablet, Rfl: 0   pantoprazole (PROTONIX) 40 MG tablet, TAKE 1 TABLET BY MOUTH EVERY DAY 30 MINUTES BEFORE EVENING MEALS (Patient taking differently: Take 40 mg by mouth daily before supper.), Disp: 30 tablet, Rfl: 0   Probiotic Product (ALIGN) 4 MG CAPS, Take 4 mg by mouth in the morning., Disp: , Rfl:    rosuvastatin (CRESTOR) 10 MG tablet, Take 1 tablet (10 mg total) by mouth at bedtime., Disp: 90 tablet, Rfl: 3    Sulfacetamide Sodium, Acne, 10 % LOTN, Apply 1 application topically in the morning., Disp: , Rfl:    traMADol (ULTRAM) 50 MG tablet, Take 50 mg by mouth at bedtime., Disp: , Rfl:     Physical Exam: There were no vitals taken for this visit.   Affect appropriate Healthy:  appears stated age 50: normal Neck supple with no adenopathy JVP normal no bruits no thyromegaly Lungs clear with no wheezing and good diaphragmatic motion Heart:  S1/S2 no murmur, no rub, gallop or click PMI normal Abdomen: benighn, BS positve, no tenderness, no AAA no bruit.  No HSM or HJR Distal pulses intact with no bruits No edema Neuro non-focal Skin warm and dry No muscular weakness   Labs:   Lab Results  Component Value Date   WBC 8.9 03/14/2021   HGB 16.4 03/14/2021   HCT 49.6 03/14/2021   MCV 92 03/14/2021   PLT 288 03/14/2021   No results for input(s): "NA", "K", "CL", "CO2", "BUN", "CREATININE", "CALCIUM", "PROT", "BILITOT", "ALKPHOS", "ALT", "AST", "GLUCOSE" in the last 168 hours.  Invalid input(s): "LABALBU" No results found for: "CKTOTAL", "CKMB", "CKMBINDEX", "TROPONINI"  Lab Results  Component Value Date   CHOL 228 (H) 04/10/2011   CHOL 171 07/16/2008   Lab Results  Component Value Date   HDL 57.70 04/10/2011   HDL 55.8 07/16/2008   Lab Results  Component Value Date   LDLCALC 104 (H) 07/16/2008   Lab Results  Component Value Date   TRIG 53.0 04/10/2011   TRIG 54 07/16/2008   Lab Results  Component Value Date   CHOLHDL 4 04/10/2011   CHOLHDL 3.1 CALC 07/16/2008   Lab Results  Component Value Date   LDLDIRECT 151.6 04/10/2011      Radiology: No results found.   EKG: NSR normal ECG    ASSESSMENT AND PLAN:   1.  CAD:  Detected on calcium screening  CT 02/17/21 Score 320 9 th percentile Suggested hemodynamically significant circumflex lesion On non invasive HeartFlow FFR Cath done 03/18/21 with 50% mid circumflex lesion non obstructive Risk factor modification  ASA/statin   2. HTN:  ***  3. HLD on crestor LDL 72 02/05/21 recent labs at primary ***  4. Tinnitus :  f/u ENT ***   ***  F/U in a year   Signed: [name] 04/08/2022, 8:17 AM

## 2022-04-10 ENCOUNTER — Encounter: Payer: Self-pay | Admitting: Cardiovascular Disease

## 2022-04-13 ENCOUNTER — Other Ambulatory Visit: Payer: Medicare Other

## 2022-04-14 DIAGNOSIS — E7800 Pure hypercholesterolemia, unspecified: Secondary | ICD-10-CM | POA: Diagnosis not present

## 2022-04-14 DIAGNOSIS — I10 Essential (primary) hypertension: Secondary | ICD-10-CM | POA: Diagnosis not present

## 2022-04-14 LAB — LIPID PANEL
Cholesterol: 136 (ref 0–200)
HDL: 66 (ref 35–70)
LDL Cholesterol: 58
Triglycerides: 59 (ref 40–160)

## 2022-04-14 LAB — HEPATIC FUNCTION PANEL
ALT: 30 U/L (ref 10–40)
AST: 34 (ref 14–40)
Alkaline Phosphatase: 80 (ref 25–125)
Bilirubin, Total: 0.7

## 2022-04-16 DIAGNOSIS — M1180 Other specified crystal arthropathies, unspecified site: Secondary | ICD-10-CM | POA: Diagnosis not present

## 2022-04-16 DIAGNOSIS — R413 Other amnesia: Secondary | ICD-10-CM | POA: Diagnosis not present

## 2022-04-16 DIAGNOSIS — I10 Essential (primary) hypertension: Secondary | ICD-10-CM | POA: Diagnosis not present

## 2022-04-16 DIAGNOSIS — E7800 Pure hypercholesterolemia, unspecified: Secondary | ICD-10-CM | POA: Diagnosis not present

## 2022-04-16 DIAGNOSIS — K2270 Barrett's esophagus without dysplasia: Secondary | ICD-10-CM | POA: Diagnosis not present

## 2022-04-16 DIAGNOSIS — Z8679 Personal history of other diseases of the circulatory system: Secondary | ICD-10-CM | POA: Diagnosis not present

## 2022-04-16 DIAGNOSIS — F325 Major depressive disorder, single episode, in full remission: Secondary | ICD-10-CM | POA: Diagnosis not present

## 2022-04-16 DIAGNOSIS — N400 Enlarged prostate without lower urinary tract symptoms: Secondary | ICD-10-CM | POA: Diagnosis not present

## 2022-04-16 DIAGNOSIS — Z85820 Personal history of malignant melanoma of skin: Secondary | ICD-10-CM | POA: Diagnosis not present

## 2022-04-16 DIAGNOSIS — G629 Polyneuropathy, unspecified: Secondary | ICD-10-CM | POA: Diagnosis not present

## 2022-04-16 DIAGNOSIS — Z0000 Encounter for general adult medical examination without abnormal findings: Secondary | ICD-10-CM | POA: Diagnosis not present

## 2022-04-16 DIAGNOSIS — I2510 Atherosclerotic heart disease of native coronary artery without angina pectoris: Secondary | ICD-10-CM | POA: Diagnosis not present

## 2022-04-21 ENCOUNTER — Encounter: Payer: Self-pay | Admitting: Cardiovascular Disease

## 2022-04-21 ENCOUNTER — Ambulatory Visit (INDEPENDENT_AMBULATORY_CARE_PROVIDER_SITE_OTHER): Payer: Medicare Other | Admitting: Cardiovascular Disease

## 2022-04-21 VITALS — BP 150/72 | HR 82 | Ht 69.0 in | Wt 175.0 lb

## 2022-04-21 DIAGNOSIS — I2510 Atherosclerotic heart disease of native coronary artery without angina pectoris: Secondary | ICD-10-CM

## 2022-04-21 DIAGNOSIS — E782 Mixed hyperlipidemia: Secondary | ICD-10-CM

## 2022-05-05 DIAGNOSIS — M6281 Muscle weakness (generalized): Secondary | ICD-10-CM | POA: Diagnosis not present

## 2022-05-05 DIAGNOSIS — R269 Unspecified abnormalities of gait and mobility: Secondary | ICD-10-CM | POA: Diagnosis not present

## 2022-05-11 DIAGNOSIS — G609 Hereditary and idiopathic neuropathy, unspecified: Secondary | ICD-10-CM | POA: Diagnosis not present

## 2022-05-11 DIAGNOSIS — M48061 Spinal stenosis, lumbar region without neurogenic claudication: Secondary | ICD-10-CM | POA: Diagnosis not present

## 2022-05-11 DIAGNOSIS — M5416 Radiculopathy, lumbar region: Secondary | ICD-10-CM | POA: Diagnosis not present

## 2022-05-12 ENCOUNTER — Other Ambulatory Visit: Payer: Self-pay | Admitting: Neurology

## 2022-05-12 DIAGNOSIS — R29898 Other symptoms and signs involving the musculoskeletal system: Secondary | ICD-10-CM

## 2022-05-19 DIAGNOSIS — M6281 Muscle weakness (generalized): Secondary | ICD-10-CM | POA: Diagnosis not present

## 2022-05-19 DIAGNOSIS — R269 Unspecified abnormalities of gait and mobility: Secondary | ICD-10-CM | POA: Diagnosis not present

## 2022-05-21 ENCOUNTER — Ambulatory Visit
Admission: RE | Admit: 2022-05-21 | Discharge: 2022-05-21 | Disposition: A | Discharge status: Home / Self Care | Payer: Medicare Other | Source: Ambulatory Visit | Attending: Neurology | Admitting: Neurology

## 2022-05-21 DIAGNOSIS — M47817 Spondylosis without myelopathy or radiculopathy, lumbosacral region: Secondary | ICD-10-CM | POA: Diagnosis not present

## 2022-05-21 DIAGNOSIS — M2578 Osteophyte, vertebrae: Secondary | ICD-10-CM | POA: Diagnosis not present

## 2022-05-21 DIAGNOSIS — G609 Hereditary and idiopathic neuropathy, unspecified: Secondary | ICD-10-CM | POA: Diagnosis not present

## 2022-05-21 DIAGNOSIS — R29898 Other symptoms and signs involving the musculoskeletal system: Secondary | ICD-10-CM

## 2022-05-21 DIAGNOSIS — R200 Anesthesia of skin: Secondary | ICD-10-CM | POA: Diagnosis not present

## 2022-05-21 MED ORDER — GADOBENATE DIMEGLUMINE 529 MG/ML IV SOLN
15.0000 mL | Freq: Once | INTRAVENOUS | Status: AC | PRN
  Administered 2022-05-21: 15 mL via INTRAVENOUS

## 2022-05-26 DIAGNOSIS — R269 Unspecified abnormalities of gait and mobility: Secondary | ICD-10-CM | POA: Diagnosis not present

## 2022-05-26 DIAGNOSIS — M47812 Spondylosis without myelopathy or radiculopathy, cervical region: Secondary | ICD-10-CM | POA: Diagnosis not present

## 2022-05-26 DIAGNOSIS — M5020 Other cervical disc displacement, unspecified cervical region: Secondary | ICD-10-CM | POA: Diagnosis not present

## 2022-05-26 DIAGNOSIS — M6281 Muscle weakness (generalized): Secondary | ICD-10-CM | POA: Diagnosis not present

## 2022-05-26 DIAGNOSIS — M47816 Spondylosis without myelopathy or radiculopathy, lumbar region: Secondary | ICD-10-CM | POA: Diagnosis not present

## 2022-06-01 DIAGNOSIS — M2578 Osteophyte, vertebrae: Secondary | ICD-10-CM | POA: Diagnosis not present

## 2022-06-01 DIAGNOSIS — M47812 Spondylosis without myelopathy or radiculopathy, cervical region: Secondary | ICD-10-CM | POA: Diagnosis not present

## 2022-06-01 DIAGNOSIS — M50021 Cervical disc disorder at C4-C5 level with myelopathy: Secondary | ICD-10-CM | POA: Diagnosis not present

## 2022-06-01 DIAGNOSIS — M5001 Cervical disc disorder with myelopathy,  high cervical region: Secondary | ICD-10-CM | POA: Diagnosis not present

## 2022-06-22 DIAGNOSIS — M47812 Spondylosis without myelopathy or radiculopathy, cervical region: Secondary | ICD-10-CM | POA: Diagnosis not present

## 2022-07-01 DIAGNOSIS — L821 Other seborrheic keratosis: Secondary | ICD-10-CM | POA: Diagnosis not present

## 2022-07-01 DIAGNOSIS — Z85820 Personal history of malignant melanoma of skin: Secondary | ICD-10-CM | POA: Diagnosis not present

## 2022-07-01 DIAGNOSIS — D2272 Melanocytic nevi of left lower limb, including hip: Secondary | ICD-10-CM | POA: Diagnosis not present

## 2022-07-01 DIAGNOSIS — L720 Epidermal cyst: Secondary | ICD-10-CM | POA: Diagnosis not present

## 2022-07-01 DIAGNOSIS — D225 Melanocytic nevi of trunk: Secondary | ICD-10-CM | POA: Diagnosis not present

## 2022-07-01 DIAGNOSIS — L814 Other melanin hyperpigmentation: Secondary | ICD-10-CM | POA: Diagnosis not present

## 2022-07-01 DIAGNOSIS — Z85828 Personal history of other malignant neoplasm of skin: Secondary | ICD-10-CM | POA: Diagnosis not present

## 2022-07-15 ENCOUNTER — Telehealth: Payer: Self-pay | Admitting: Family Medicine

## 2022-07-15 DIAGNOSIS — M6281 Muscle weakness (generalized): Secondary | ICD-10-CM | POA: Diagnosis not present

## 2022-07-15 DIAGNOSIS — R269 Unspecified abnormalities of gait and mobility: Secondary | ICD-10-CM | POA: Diagnosis not present

## 2022-07-15 DIAGNOSIS — M47812 Spondylosis without myelopathy or radiculopathy, cervical region: Secondary | ICD-10-CM | POA: Diagnosis not present

## 2022-07-15 DIAGNOSIS — M542 Cervicalgia: Secondary | ICD-10-CM | POA: Diagnosis not present

## 2022-07-15 NOTE — Telephone Encounter (Signed)
Dr. Yong Channel is only accepting new patients of current family members.

## 2022-07-15 NOTE — Telephone Encounter (Signed)
Patient's PCP is retiring-Patient was referred to you by Nelson Chimes (you were highly recommended). Will you accept him as a New Patient? Please advise.Marland KitchenMarland Kitchen

## 2022-07-21 DIAGNOSIS — R269 Unspecified abnormalities of gait and mobility: Secondary | ICD-10-CM | POA: Diagnosis not present

## 2022-07-21 DIAGNOSIS — M47812 Spondylosis without myelopathy or radiculopathy, cervical region: Secondary | ICD-10-CM | POA: Diagnosis not present

## 2022-07-21 DIAGNOSIS — M542 Cervicalgia: Secondary | ICD-10-CM | POA: Diagnosis not present

## 2022-07-21 DIAGNOSIS — M6281 Muscle weakness (generalized): Secondary | ICD-10-CM | POA: Diagnosis not present

## 2022-07-23 DIAGNOSIS — M47812 Spondylosis without myelopathy or radiculopathy, cervical region: Secondary | ICD-10-CM | POA: Diagnosis not present

## 2022-07-23 DIAGNOSIS — M6281 Muscle weakness (generalized): Secondary | ICD-10-CM | POA: Diagnosis not present

## 2022-07-23 DIAGNOSIS — Z23 Encounter for immunization: Secondary | ICD-10-CM | POA: Diagnosis not present

## 2022-07-23 DIAGNOSIS — M542 Cervicalgia: Secondary | ICD-10-CM | POA: Diagnosis not present

## 2022-07-23 DIAGNOSIS — R269 Unspecified abnormalities of gait and mobility: Secondary | ICD-10-CM | POA: Diagnosis not present

## 2022-07-28 DIAGNOSIS — M6281 Muscle weakness (generalized): Secondary | ICD-10-CM | POA: Diagnosis not present

## 2022-07-28 DIAGNOSIS — R269 Unspecified abnormalities of gait and mobility: Secondary | ICD-10-CM | POA: Diagnosis not present

## 2022-07-28 DIAGNOSIS — M47812 Spondylosis without myelopathy or radiculopathy, cervical region: Secondary | ICD-10-CM | POA: Diagnosis not present

## 2022-07-28 DIAGNOSIS — M542 Cervicalgia: Secondary | ICD-10-CM | POA: Diagnosis not present

## 2022-08-04 DIAGNOSIS — R269 Unspecified abnormalities of gait and mobility: Secondary | ICD-10-CM | POA: Diagnosis not present

## 2022-08-04 DIAGNOSIS — M542 Cervicalgia: Secondary | ICD-10-CM | POA: Diagnosis not present

## 2022-08-04 DIAGNOSIS — M6281 Muscle weakness (generalized): Secondary | ICD-10-CM | POA: Diagnosis not present

## 2022-08-04 DIAGNOSIS — M47812 Spondylosis without myelopathy or radiculopathy, cervical region: Secondary | ICD-10-CM | POA: Diagnosis not present

## 2022-08-06 DIAGNOSIS — M542 Cervicalgia: Secondary | ICD-10-CM | POA: Diagnosis not present

## 2022-08-06 DIAGNOSIS — R269 Unspecified abnormalities of gait and mobility: Secondary | ICD-10-CM | POA: Diagnosis not present

## 2022-08-06 DIAGNOSIS — M6281 Muscle weakness (generalized): Secondary | ICD-10-CM | POA: Diagnosis not present

## 2022-08-06 DIAGNOSIS — M47812 Spondylosis without myelopathy or radiculopathy, cervical region: Secondary | ICD-10-CM | POA: Diagnosis not present

## 2022-08-07 DIAGNOSIS — Z23 Encounter for immunization: Secondary | ICD-10-CM | POA: Diagnosis not present

## 2022-08-12 DIAGNOSIS — M6281 Muscle weakness (generalized): Secondary | ICD-10-CM | POA: Diagnosis not present

## 2022-08-12 DIAGNOSIS — M47812 Spondylosis without myelopathy or radiculopathy, cervical region: Secondary | ICD-10-CM | POA: Diagnosis not present

## 2022-08-12 DIAGNOSIS — M542 Cervicalgia: Secondary | ICD-10-CM | POA: Diagnosis not present

## 2022-08-12 DIAGNOSIS — R269 Unspecified abnormalities of gait and mobility: Secondary | ICD-10-CM | POA: Diagnosis not present

## 2022-08-21 DIAGNOSIS — M542 Cervicalgia: Secondary | ICD-10-CM | POA: Diagnosis not present

## 2022-08-21 DIAGNOSIS — R269 Unspecified abnormalities of gait and mobility: Secondary | ICD-10-CM | POA: Diagnosis not present

## 2022-08-21 DIAGNOSIS — M47812 Spondylosis without myelopathy or radiculopathy, cervical region: Secondary | ICD-10-CM | POA: Diagnosis not present

## 2022-08-21 DIAGNOSIS — M6281 Muscle weakness (generalized): Secondary | ICD-10-CM | POA: Diagnosis not present

## 2022-08-25 DIAGNOSIS — R269 Unspecified abnormalities of gait and mobility: Secondary | ICD-10-CM | POA: Diagnosis not present

## 2022-08-25 DIAGNOSIS — M6281 Muscle weakness (generalized): Secondary | ICD-10-CM | POA: Diagnosis not present

## 2022-08-25 DIAGNOSIS — M47812 Spondylosis without myelopathy or radiculopathy, cervical region: Secondary | ICD-10-CM | POA: Diagnosis not present

## 2022-08-25 DIAGNOSIS — M542 Cervicalgia: Secondary | ICD-10-CM | POA: Diagnosis not present

## 2022-08-28 DIAGNOSIS — M47812 Spondylosis without myelopathy or radiculopathy, cervical region: Secondary | ICD-10-CM | POA: Diagnosis not present

## 2022-08-28 DIAGNOSIS — M542 Cervicalgia: Secondary | ICD-10-CM | POA: Diagnosis not present

## 2022-08-28 DIAGNOSIS — M6281 Muscle weakness (generalized): Secondary | ICD-10-CM | POA: Diagnosis not present

## 2022-08-28 DIAGNOSIS — R269 Unspecified abnormalities of gait and mobility: Secondary | ICD-10-CM | POA: Diagnosis not present

## 2022-09-01 DIAGNOSIS — R269 Unspecified abnormalities of gait and mobility: Secondary | ICD-10-CM | POA: Diagnosis not present

## 2022-09-01 DIAGNOSIS — M6281 Muscle weakness (generalized): Secondary | ICD-10-CM | POA: Diagnosis not present

## 2022-09-01 DIAGNOSIS — M47812 Spondylosis without myelopathy or radiculopathy, cervical region: Secondary | ICD-10-CM | POA: Diagnosis not present

## 2022-09-01 DIAGNOSIS — M542 Cervicalgia: Secondary | ICD-10-CM | POA: Diagnosis not present

## 2022-09-08 DIAGNOSIS — R269 Unspecified abnormalities of gait and mobility: Secondary | ICD-10-CM | POA: Diagnosis not present

## 2022-09-08 DIAGNOSIS — M47812 Spondylosis without myelopathy or radiculopathy, cervical region: Secondary | ICD-10-CM | POA: Diagnosis not present

## 2022-09-08 DIAGNOSIS — M542 Cervicalgia: Secondary | ICD-10-CM | POA: Diagnosis not present

## 2022-09-08 DIAGNOSIS — M6281 Muscle weakness (generalized): Secondary | ICD-10-CM | POA: Diagnosis not present

## 2022-09-10 DIAGNOSIS — R269 Unspecified abnormalities of gait and mobility: Secondary | ICD-10-CM | POA: Diagnosis not present

## 2022-09-10 DIAGNOSIS — M6281 Muscle weakness (generalized): Secondary | ICD-10-CM | POA: Diagnosis not present

## 2022-09-10 DIAGNOSIS — M542 Cervicalgia: Secondary | ICD-10-CM | POA: Diagnosis not present

## 2022-09-10 DIAGNOSIS — M47812 Spondylosis without myelopathy or radiculopathy, cervical region: Secondary | ICD-10-CM | POA: Diagnosis not present

## 2022-09-22 DIAGNOSIS — H43813 Vitreous degeneration, bilateral: Secondary | ICD-10-CM | POA: Diagnosis not present

## 2022-09-22 DIAGNOSIS — H04123 Dry eye syndrome of bilateral lacrimal glands: Secondary | ICD-10-CM | POA: Diagnosis not present

## 2022-09-22 DIAGNOSIS — H2512 Age-related nuclear cataract, left eye: Secondary | ICD-10-CM | POA: Diagnosis not present

## 2022-09-22 DIAGNOSIS — H52223 Regular astigmatism, bilateral: Secondary | ICD-10-CM | POA: Diagnosis not present

## 2022-09-22 DIAGNOSIS — H524 Presbyopia: Secondary | ICD-10-CM | POA: Diagnosis not present

## 2022-09-22 DIAGNOSIS — H25811 Combined forms of age-related cataract, right eye: Secondary | ICD-10-CM | POA: Diagnosis not present

## 2022-09-22 DIAGNOSIS — H5213 Myopia, bilateral: Secondary | ICD-10-CM | POA: Diagnosis not present

## 2022-09-24 ENCOUNTER — Telehealth (INDEPENDENT_AMBULATORY_CARE_PROVIDER_SITE_OTHER): Payer: Medicare Other | Admitting: Nurse Practitioner

## 2022-09-24 DIAGNOSIS — J40 Bronchitis, not specified as acute or chronic: Secondary | ICD-10-CM | POA: Diagnosis not present

## 2022-09-24 MED ORDER — BENZONATATE 100 MG PO CAPS: 100.0000 mg | ORAL_CAPSULE | Freq: Two times a day (BID) | ORAL | 0 refills | Status: DC | PRN

## 2022-09-24 MED ORDER — ALBUTEROL SULFATE HFA 108 (90 BASE) MCG/ACT IN AERS: 2.0000 | INHALATION_SPRAY | Freq: Four times a day (QID) | RESPIRATORY_TRACT | 2 refills | Status: DC | PRN

## 2022-09-24 MED ORDER — AZITHROMYCIN 250 MG PO TABS: ORAL_TABLET | ORAL | 0 refills | Status: AC

## 2022-09-24 NOTE — Assessment & Plan Note (Signed)
Acute, symptoms appear to be consistent with bronchitis.  Recommend symptom management with albuterol inhaler as needed, Mucinex, and Tessalon Perles.  Prescriptions sent.  Will also consider treatment with antibiotic therapy, had shared decision making decision regarding antibiotic options and will opt to prescribe Z-Pak that patient can use if symptoms persist for a total of 10 to 14 days.  Patient reports understanding, encouraged to call office if symptoms do not improve.

## 2022-09-24 NOTE — Progress Notes (Signed)
   Established Patient Office Visit  An audio/visual tele-health visit was completed today for this patient. I connected with  [name], MD on 09/24/22 utilizing audio/visual technology and verified that I am speaking with the correct person using two identifiers. The patient was located at their place of employment, and I was located at home during the encounter. I discussed the limitations of evaluation and management by telemedicine. The patient expressed understanding and agreed to proceed.     Subjective   Patient ID: [name], MD, male    DOB: 21-Jul-1946  Age: 76 y.o. MRN: [id number]  Chief Complaint  Patient presents with   Wheezing    Patient has a virtual visit for the above.  Reports symptom onset 1 week ago.  Reports symptoms overall are improving, continuing to have fatigue, sore throat, wheeze, and mild cough.  Reports having a hard time sleeping due to the wheezing.  It does not have shortness of breath.  Tested himself for COVID-19 and this was negative.  Patient using Mucinex over-the-counter.    Review of Systems  Constitutional:  Positive for malaise/fatigue. Negative for chills and fever.  HENT:  Positive for sore throat.   Respiratory:  Positive for cough and wheezing.       Objective:     There were no vitals taken for this visit.   Physical Exam Comprehensive physical exam not completed today as office visit was conducted remotely.  Patient appears well over video.  Patient was alert and oriented, and appeared to have appropriate judgment.   No results found for any visits on 09/24/22.    The ASCVD Risk score (Arnett DK, et al., 2019) failed to calculate for the following reasons:   Cannot find a previous HDL lab   Cannot find a previous total cholesterol lab    Assessment & Plan:   Problem List Items Addressed This Visit       Respiratory   Bronchitis - Primary    Acute, symptoms appear to be consistent with bronchitis.  Recommend  symptom management with albuterol inhaler as needed, Mucinex, and Tessalon Perles.  Prescriptions sent.  Will also consider treatment with antibiotic therapy, had shared decision making decision regarding antibiotic options and will opt to prescribe Z-Pak that patient can use if symptoms persist for a total of 10 to 14 days.  Patient reports understanding, encouraged to call office if symptoms do not improve.      Relevant Medications   albuterol (VENTOLIN HFA) 108 (90 Base) MCG/ACT inhaler   azithromycin (ZITHROMAX) 250 MG tablet   benzonatate (TESSALON) 100 MG capsule    Return if symptoms worsen or fail to improve.    Ailene Ards, NP

## 2022-09-29 DIAGNOSIS — R269 Unspecified abnormalities of gait and mobility: Secondary | ICD-10-CM | POA: Diagnosis not present

## 2022-09-29 DIAGNOSIS — M542 Cervicalgia: Secondary | ICD-10-CM | POA: Diagnosis not present

## 2022-09-29 DIAGNOSIS — M6281 Muscle weakness (generalized): Secondary | ICD-10-CM | POA: Diagnosis not present

## 2022-09-29 DIAGNOSIS — M47812 Spondylosis without myelopathy or radiculopathy, cervical region: Secondary | ICD-10-CM | POA: Diagnosis not present

## 2022-10-05 ENCOUNTER — Ambulatory Visit (INDEPENDENT_AMBULATORY_CARE_PROVIDER_SITE_OTHER): Payer: Medicare Other | Admitting: Internal Medicine

## 2022-10-05 ENCOUNTER — Encounter: Payer: Self-pay | Admitting: Internal Medicine

## 2022-10-05 ENCOUNTER — Other Ambulatory Visit: Payer: Self-pay | Admitting: Neurological Surgery

## 2022-10-05 VITALS — BP 162/84 | HR 78 | Temp 97.8°F | Resp 16 | Ht 69.0 in | Wt 176.0 lb

## 2022-10-05 DIAGNOSIS — I10 Essential (primary) hypertension: Secondary | ICD-10-CM | POA: Insufficient documentation

## 2022-10-05 DIAGNOSIS — R0989 Other specified symptoms and signs involving the circulatory and respiratory systems: Secondary | ICD-10-CM | POA: Insufficient documentation

## 2022-10-05 DIAGNOSIS — I2510 Atherosclerotic heart disease of native coronary artery without angina pectoris: Secondary | ICD-10-CM

## 2022-10-05 DIAGNOSIS — E785 Hyperlipidemia, unspecified: Secondary | ICD-10-CM

## 2022-10-05 DIAGNOSIS — Z23 Encounter for immunization: Secondary | ICD-10-CM

## 2022-10-05 DIAGNOSIS — N401 Enlarged prostate with lower urinary tract symptoms: Secondary | ICD-10-CM | POA: Diagnosis not present

## 2022-10-05 DIAGNOSIS — R3912 Poor urinary stream: Secondary | ICD-10-CM | POA: Diagnosis not present

## 2022-10-05 DIAGNOSIS — M47812 Spondylosis without myelopathy or radiculopathy, cervical region: Secondary | ICD-10-CM

## 2022-10-05 LAB — CBC WITH DIFFERENTIAL/PLATELET
Basophils Absolute: 0.1 10*3/uL (ref 0.0–0.1)
Basophils Relative: 1.1 % (ref 0.0–3.0)
Eosinophils Absolute: 0.3 10*3/uL (ref 0.0–0.7)
Eosinophils Relative: 4.4 % (ref 0.0–5.0)
HCT: 44.5 % (ref 39.0–52.0)
Hemoglobin: 15.4 g/dL (ref 13.0–17.0)
Lymphocytes Relative: 14.6 % (ref 12.0–46.0)
Lymphs Abs: 1.1 10*3/uL (ref 0.7–4.0)
MCHC: 34.7 g/dL (ref 30.0–36.0)
MCV: 89.9 fl (ref 78.0–100.0)
Monocytes Absolute: 0.8 10*3/uL (ref 0.1–1.0)
Monocytes Relative: 11.5 % (ref 3.0–12.0)
Neutro Abs: 4.9 10*3/uL (ref 1.4–7.7)
Neutrophils Relative %: 68.4 % (ref 43.0–77.0)
Platelets: 277 10*3/uL (ref 150.0–400.0)
RBC: 4.95 Mil/uL (ref 4.22–5.81)
RDW: 13.1 % (ref 11.5–15.5)
WBC: 7.2 10*3/uL (ref 4.0–10.5)

## 2022-10-05 LAB — URINALYSIS, ROUTINE W REFLEX MICROSCOPIC
Bilirubin Urine: NEGATIVE
Hgb urine dipstick: NEGATIVE
Ketones, ur: NEGATIVE
Leukocytes,Ua: NEGATIVE
Nitrite: NEGATIVE
RBC / HPF: NONE SEEN (ref 0–?)
Specific Gravity, Urine: 1.01 (ref 1.000–1.030)
Total Protein, Urine: NEGATIVE
Urine Glucose: NEGATIVE
Urobilinogen, UA: 0.2 (ref 0.0–1.0)
WBC, UA: NONE SEEN (ref 0–?)
pH: 6 (ref 5.0–8.0)

## 2022-10-05 LAB — BASIC METABOLIC PANEL
BUN: 19 mg/dL (ref 6–23)
CO2: 30 mEq/L (ref 19–32)
Calcium: 9.9 mg/dL (ref 8.4–10.5)
Chloride: 102 mEq/L (ref 96–112)
Creatinine, Ser: 1.03 mg/dL (ref 0.40–1.50)
GFR: 71.49 mL/min (ref >60.00)
Glucose, Bld: 92 mg/dL (ref 70–99)
Potassium: 4.5 mEq/L (ref 3.5–5.1)
Sodium: 140 mEq/L (ref 135–145)

## 2022-10-05 LAB — TSH: TSH: 1.81 u[IU]/mL (ref 0.35–5.50)

## 2022-10-05 LAB — PSA: PSA: 2.01 ng/mL (ref 0.10–4.00)

## 2022-10-05 MED ORDER — BOOSTRIX 5-2.5-18.5 LF-MCG/0.5 IM SUSP: 0.5000 mL | Freq: Once | INTRAMUSCULAR | 0 refills | Status: AC

## 2022-10-05 MED ORDER — INDAPAMIDE 1.25 MG PO TABS: 1.2500 mg | ORAL_TABLET | Freq: Every day | ORAL | 0 refills | Status: DC

## 2022-10-05 MED ORDER — OLMESARTAN MEDOXOMIL 20 MG PO TABS: 20.0000 mg | ORAL_TABLET | Freq: Every day | ORAL | 0 refills | Status: DC

## 2022-10-05 MED ORDER — ROSUVASTATIN CALCIUM 10 MG PO TABS: 10.0000 mg | ORAL_TABLET | Freq: Every day | ORAL | 1 refills | Status: DC

## 2022-10-05 NOTE — Progress Notes (Unsigned)
Subjective:  Patient ID: Peter Bachelor, MD, male    DOB: [birth year]  Age: 76 y.o. MRN: [id number]  CC: Hypertension and Hyperlipidemia   HPI Peter Bachelor, MD presents for establishing -  He is taking losartan and hydrochlorothiazide for blood pressure control.  He walks about 5 a day and has good exertion.  He denies chest pain, shortness of breath, diaphoresis, or edema.  History Peter [name] has a past medical history of Allergic rhinitis, Anxiety, Cancer ([place]), Chest pain, Complication of anesthesia, Dizziness, DJD (degenerative joint disease), Ejection fraction, Family history of Alzheimer's disease, GERD (gastroesophageal reflux disease), Hearing impaired, Heart murmur, Heart murmur (2008), History of colonic polyps, History of melanoma, History of pseudogout, History of recent steroid use, History of rheumatic fever, HNP (herniated nucleus pulposus), cervical, Hypercholesteremia, Hypertension, Lumbar spondylosis with myelopathy (06/09/2013), Spondylosis, Tinnitus, and TMJ click.   He has a past surgical history that includes Melanoma excision with sentinel lymph node dissection (8/02); Anterior cervical decomp/discectomy fusion (2003); Rotator cuff repair (Left); Lumbar fusion (12/14); Cervical discectomy; Carpal tunnel release (Right, 10/31/2013); Carpal tunnel release (Left, 11/28/2013); Total knee arthroplasty (Right, 10/07/2015); Anterior cervical decomp/discectomy fusion (N/A, 05/18/2018); Back surgery; Joint replacement; Total knee arthroplasty (Left, 06/16/2019); and LEFT HEART CATH AND CORONARY ANGIOGRAPHY (N/A, 03/18/2021).   His family history includes Alzheimer's disease in his father and mother.He reports that he has never smoked. He has never used smokeless tobacco. He reports current alcohol use of about 1.0 standard drink of alcohol per week. He reports that he does not use drugs.  Outpatient Medications Prior to Visit  Medication Sig Dispense Refill   albuterol (VENTOLIN HFA) 108 (90 Base)  MCG/ACT inhaler Inhale 2 puffs into the lungs every 6 (six) hours as needed for wheezing or shortness of breath. 8 g 2   alfuzosin (UROXATRAL) 10 MG 24 hr tablet Take 10 mg by mouth every evening.      amLODipine (NORVASC) 10 MG tablet Take 1 tablet (10 mg total) by mouth daily. 90 tablet 3   aspirin EC 81 MG tablet Take 81 mg by mouth at bedtime. Swallow whole.     benzonatate (TESSALON) 100 MG capsule Take 1 capsule (100 mg total) by mouth 2 (two) times daily as needed for cough. 20 capsule 0   celecoxib (CELEBREX) 100 MG capsule Take 1 capsule (100 mg total) by mouth 2 (two) times daily as needed. (Patient taking differently: Take 100 mg by mouth in the morning.) 60 capsule 3   fluticasone (CUTIVATE) 0.05 % cream Apply 1 application topically 2 (two) times daily.     montelukast (SINGULAIR) 10 MG tablet TAKE 1 TABLET BY MOUTH EVERYDAY AT BEDTIME (Patient taking differently: Take 10 mg by mouth at bedtime.) 90 tablet 0   pantoprazole (PROTONIX) 40 MG tablet TAKE 1 TABLET BY MOUTH EVERY DAY 30 MINUTES BEFORE EVENING MEALS (Patient taking differently: Take 40 mg by mouth daily before supper.) 30 tablet 0   Probiotic Product (ALIGN) 4 MG CAPS Take 4 mg by mouth in the morning.     traMADol (ULTRAM) 50 MG tablet Take 50 mg by mouth at bedtime.     hydrochlorothiazide (MICROZIDE) 12.5 MG capsule Take 1 capsule (12.5 mg total) by mouth daily. 90 capsule 3   losartan (COZAAR) 25 MG tablet Take 1 tablet (25 mg total) by mouth daily. 90 tablet 3   rosuvastatin (CRESTOR) 10 MG tablet Take 1 tablet (10 mg total) by mouth at bedtime. 90 tablet 3   B Complex-C (  B-COMPLEX WITH VITAMIN C) tablet Take 1 tablet by mouth in the morning. (Patient not taking: Reported on 04/21/2022)     Cholecalciferol (VITAMIN D3) 50 MCG (2000 UT) TABS Take 2,000 Units by mouth in the morning. (Patient not taking: Reported on 04/21/2022)     cycloSPORINE (RESTASIS) 0.05 % ophthalmic emulsion Place 1 drop into both eyes in the morning.      Sulfacetamide Sodium, Acne, 10 % LOTN Apply 1 application topically in the morning. (Patient not taking: Reported on 04/21/2022)     No facility-administered medications prior to visit.    ROS Review of Systems  Constitutional: Negative.  Negative for diaphoresis and fatigue.  HENT: Negative.    Eyes: Negative.   Respiratory:  Negative for cough, chest tightness, shortness of breath and wheezing.   Cardiovascular:  Negative for chest pain, palpitations and leg swelling.  Gastrointestinal:  Negative for abdominal pain, diarrhea, nausea and vomiting.  Endocrine: Negative.   Genitourinary:  Positive for difficulty urinating. Negative for decreased urine volume, dysuria, hematuria, penile pain and penile swelling.  Musculoskeletal:  Positive for arthralgias and back pain. Negative for myalgias.  Skin: Negative.   Neurological: Negative.   Hematological:  Negative for adenopathy. Does not bruise/bleed easily.  Psychiatric/Behavioral: Negative.      Objective:  BP (!) 162/84 (BP Location: Right Arm, Patient Position: Sitting, Cuff Size: Large) Comment: BP (R) 162/84 (L) 160/82  Pulse 78   Temp 97.8 F (36.6 C) (Oral)   Resp 16   Ht '5\' 9"'$  (1.753 m)   Wt 176 lb (79.8 kg)   SpO2 95%   BMI 25.99 kg/m   Physical Exam Vitals reviewed.  HENT:     Mouth/Throat:     Mouth: Mucous membranes are moist.  Eyes:     General: No scleral icterus.    Conjunctiva/sclera: Conjunctivae normal.  Cardiovascular:     Rate and Rhythm: Normal rate and regular rhythm.     Heart sounds: Normal heart sounds, S1 normal and S2 normal. No murmur heard.    No gallop.     Comments: EKG- NSR, 69 bpm LAE No LVH or Q waves Pulmonary:     Effort: Pulmonary effort is normal.     Breath sounds: No stridor. No wheezing, rhonchi or rales.  Abdominal:     General: Abdomen is flat.     Palpations: There is no mass.     Tenderness: There is no abdominal tenderness. There is no guarding or rebound.      Hernia: No hernia is present. There is no hernia in the left inguinal area or right inguinal area.  Genitourinary:    Pubic Area: No rash.      Penis: Normal and circumcised.      Testes: Normal.     Epididymis:     Right: Normal.     Left: Normal.     Prostate: Enlarged. Not tender and no nodules present.     Rectum: Normal. Guaiac result negative. No mass, tenderness, anal fissure, external hemorrhoid or internal hemorrhoid. Normal anal tone.  Musculoskeletal:        General: No swelling. Normal range of motion.     Cervical back: Neck supple.     Right lower leg: No edema.     Left lower leg: No edema.  Lymphadenopathy:     Cervical: No cervical adenopathy.     Lower Body: No right inguinal adenopathy. No left inguinal adenopathy.  Skin:    General: Skin is warm  and dry.     Findings: No lesion.  Neurological:     General: No focal deficit present.     Mental Status: He is alert.  Psychiatric:        Mood and Affect: Mood normal.        Behavior: Behavior normal.      Lab Results  Component Value Date   WBC 7.2 10/05/2022   HGB 15.4 10/05/2022   HCT 44.5 10/05/2022   PLT 277.0 10/05/2022   GLUCOSE 92 10/05/2022   CHOL 136 04/14/2022   TRIG 59 04/14/2022   HDL 66 04/14/2022   LDLDIRECT 151.6 04/10/2011   LDLCALC 58 04/14/2022   ALT 30 04/14/2022   AST 34 04/14/2022   NA 140 10/05/2022   K 4.5 10/05/2022   CL 102 10/05/2022   CREATININE 1.03 10/05/2022   BUN 19 10/05/2022   CO2 30 10/05/2022   TSH 1.81 10/05/2022   PSA 2.01 10/05/2022   INR 1.0 03/09/2017     Assessment & Plan:   Nadir was seen today for hypertension and hyperlipidemia.  Diagnoses and all orders for this visit:  Primary hypertension- His blood pressure is not adequately well-controlled.  Will evaluate for secondary causes and upgrade to a more potent ARB and thiazide diuretic. -     Urinalysis, Routine w reflex microscopic; Future -     CBC with Differential/Platelet; Future -      Basic metabolic panel; Future -     EKG 12-Lead -     Basic metabolic panel -     CBC with Differential/Platelet -     Urinalysis, Routine w reflex microscopic -     olmesartan (BENICAR) 20 MG tablet; Take 1 tablet (20 mg total) by mouth daily. -     indapamide (LOZOL) 1.25 MG tablet; Take 1 tablet (1.25 mg total) by mouth daily.  Dyslipidemia, goal LDL below 70- LDL goal achieved. Doing well on the statin  -     TSH; Future -     TSH -     rosuvastatin (CRESTOR) 10 MG tablet; Take 1 tablet (10 mg total) by mouth at bedtime.  Abdominal bruit -     VAS [place] AAA DUPLEX; Future  Accelerated essential hypertension -     VAS US RENAL ARTERY DUPLEX; Future  Benign prostatic hyperplasia with weak urinary stream- His PSA is normal. -     PSA; Future -     PSA  Need for prophylactic vaccination with combined diphtheria-tetanus-pertussis (DTP) vaccine -     Tdap (BOOSTRIX) 5-2.5-18.5 LF-MCG/0.5 injection; Inject 0.5 mLs into the muscle once for 1 dose.   I have discontinued Dr. Eddie Dibbles D. [name], MD's cycloSPORINE, Vitamin D3, Sulfacetamide Sodium (Acne), B-complex with vitamin C, losartan, and hydrochlorothiazide. I am also having him start on Boostrix, olmesartan, and indapamide. Additionally, I am having him maintain his alfuzosin, pantoprazole, montelukast, celecoxib, fluticasone, traMADol, aspirin EC, Align, amLODipine, albuterol, benzonatate, and rosuvastatin.  Meds ordered this encounter  Medications   Tdap (BOOSTRIX) 5-2.5-18.5 LF-MCG/0.5 injection    Sig: Inject 0.5 mLs into the muscle once for 1 dose.    Dispense:  0.5 mL    Refill:  0   olmesartan (BENICAR) 20 MG tablet    Sig: Take 1 tablet (20 mg total) by mouth daily.    Dispense:  90 tablet    Refill:  0   indapamide (LOZOL) 1.25 MG tablet    Sig: Take 1 tablet (1.25 mg total) by mouth  daily.    Dispense:  90 tablet    Refill:  0   rosuvastatin (CRESTOR) 10 MG tablet    Sig: Take 1 tablet (10 mg total) by mouth at bedtime.     Dispense:  90 tablet    Refill:  1     Follow-up: Return in about 3 months (around 01/04/2023).  Scarlette Calico, MD

## 2022-10-05 NOTE — Patient Instructions (Signed)
Hypertension, Adult High blood pressure (hypertension) is when the force of blood pumping through the arteries is too strong. The arteries are the blood vessels that carry blood from the heart throughout the body. Hypertension forces the heart to work harder to pump blood and may cause arteries to become narrow or stiff. Untreated or uncontrolled hypertension can lead to a heart attack, heart failure, a stroke, kidney disease, and other problems. A blood pressure reading consists of a higher number over a lower number. Ideally, your blood pressure should be below 120/80. The first ("top") number is called the systolic pressure. It is a measure of the pressure in your arteries as your heart beats. The second ("bottom") number is called the diastolic pressure. It is a measure of the pressure in your arteries as the heart relaxes. What are the causes? The exact cause of this condition is not known. There are some conditions that result in high blood pressure. What increases the risk? Certain factors may make you more likely to develop high blood pressure. Some of these risk factors are under your control, including: Smoking. Not getting enough exercise or physical activity. Being overweight. Having too much fat, sugar, calories, or salt (sodium) in your diet. Drinking too much alcohol. Other risk factors include: Having a personal history of heart disease, diabetes, high cholesterol, or kidney disease. Stress. Having a family history of high blood pressure and high cholesterol. Having obstructive sleep apnea. Age. The risk increases with age. What are the signs or symptoms? High blood pressure may not cause symptoms. Very high blood pressure (hypertensive crisis) may cause: Headache. Fast or irregular heartbeats (palpitations). Shortness of breath. Nosebleed. Nausea and vomiting. Vision changes. Severe chest pain, dizziness, and seizures. How is this diagnosed? This condition is diagnosed by  measuring your blood pressure while you are seated, with your arm resting on a flat surface, your legs uncrossed, and your feet flat on the floor. The cuff of the blood pressure monitor will be placed directly against the skin of your upper arm at the level of your heart. Blood pressure should be measured at least twice using the same arm. Certain conditions can cause a difference in blood pressure between your right and left arms. If you have a high blood pressure reading during one visit or you have normal blood pressure with other risk factors, you may be asked to: Return on a different day to have your blood pressure checked again. Monitor your blood pressure at home for 1 week or longer. If you are diagnosed with hypertension, you may have other blood or imaging tests to help your health care provider understand your overall risk for other conditions. How is this treated? This condition is treated by making healthy lifestyle changes, such as eating healthy foods, exercising more, and reducing your alcohol intake. You may be referred for counseling on a healthy diet and physical activity. Your health care provider may prescribe medicine if lifestyle changes are not enough to get your blood pressure under control and if: Your systolic blood pressure is above 130. Your diastolic blood pressure is above 80. Your personal target blood pressure may vary depending on your medical conditions, your age, and other factors. Follow these instructions at home: Eating and drinking  Eat a diet that is high in fiber and potassium, and low in sodium, added sugar, and fat. An example of this eating plan is called the DASH diet. DASH stands for Dietary Approaches to Stop Hypertension. To eat this way: Eat   plenty of fresh fruits and vegetables. Try to fill one half of your plate at each meal with fruits and vegetables. Eat whole grains, such as whole-wheat pasta, brown rice, or whole-grain bread. Fill about one  fourth of your plate with whole grains. Eat or drink low-fat dairy products, such as skim milk or low-fat yogurt. Avoid fatty cuts of meat, processed or cured meats, and poultry with skin. Fill about one fourth of your plate with lean proteins, such as fish, chicken without skin, beans, eggs, or tofu. Avoid pre-made and processed foods. These tend to be higher in sodium, added sugar, and fat. Reduce your daily sodium intake. Many people with hypertension should eat less than 1,500 mg of sodium a day. Do not drink alcohol if: Your health care provider tells you not to drink. You are pregnant, may be pregnant, or are planning to become pregnant. If you drink alcohol: Limit how much you have to: 0-1 drink a day for women. 0-2 drinks a day for men. Know how much alcohol is in your drink. In the [place] one drink equals one 12 oz bottle of beer (355 mL), one 5 oz glass of wine (148 mL), or one 1 oz glass of hard liquor (44 mL). Lifestyle  Work with your health care provider to maintain a healthy body weight or to lose weight. Ask what an ideal weight is for you. Get at least 30 minutes of exercise that causes your heart to beat faster (aerobic exercise) most days of the week. Activities may include walking, swimming, or biking. Include exercise to strengthen your muscles (resistance exercise), such as Pilates or lifting weights, as part of your weekly exercise routine. Try to do these types of exercises for 30 minutes at least 3 days a week. Do not use any products that contain nicotine or tobacco. These products include cigarettes, chewing tobacco, and vaping devices, such as e-cigarettes. If you need help quitting, ask your health care provider. Monitor your blood pressure at home as told by your health care provider. Keep all follow-up visits. This is important. Medicines Take over-the-counter and prescription medicines only as told by your health care provider. Follow directions carefully. Blood  pressure medicines must be taken as prescribed. Do not skip doses of blood pressure medicine. Doing this puts you at risk for problems and can make the medicine less effective. Ask your health care provider about side effects or reactions to medicines that you should watch for. Contact a health care provider if you: Think you are having a reaction to a medicine you are taking. Have headaches that keep coming back (recurring). Feel dizzy. Have swelling in your ankles. Have trouble with your vision. Get help right away if you: Develop a severe headache or confusion. Have unusual weakness or numbness. Feel faint. Have severe pain in your chest or abdomen. Vomit repeatedly. Have trouble breathing. These symptoms may be an emergency. Get help right away. Call 911. Do not wait to see if the symptoms will go away. Do not drive yourself to the hospital. Summary Hypertension is when the force of blood pumping through your arteries is too strong. If this condition is not controlled, it may put you at risk for serious complications. Your personal target blood pressure may vary depending on your medical conditions, your age, and other factors. For most people, a normal blood pressure is less than 120/80. Hypertension is treated with lifestyle changes, medicines, or a combination of both. Lifestyle changes include losing weight, eating a healthy,   low-sodium diet, exercising more, and limiting alcohol. This information is not intended to replace advice given to you by your health care provider. Make sure you discuss any questions you have with your health care provider. Document Revised: 08/19/2021 Document Reviewed: 08/19/2021 Elsevier Patient Education  2023 [employer]  

## 2022-10-08 ENCOUNTER — Encounter: Payer: Self-pay | Admitting: Internal Medicine

## 2022-10-08 ENCOUNTER — Ambulatory Visit (INDEPENDENT_AMBULATORY_CARE_PROVIDER_SITE_OTHER): Payer: Medicare Other

## 2022-10-08 ENCOUNTER — Ambulatory Visit (INDEPENDENT_AMBULATORY_CARE_PROVIDER_SITE_OTHER): Payer: Medicare Other | Admitting: Internal Medicine

## 2022-10-08 VITALS — BP 132/76 | HR 80 | Temp 97.8°F | Ht 69.0 in | Wt 176.0 lb

## 2022-10-08 DIAGNOSIS — J0100 Acute maxillary sinusitis, unspecified: Secondary | ICD-10-CM

## 2022-10-08 DIAGNOSIS — R059 Cough, unspecified: Secondary | ICD-10-CM | POA: Diagnosis not present

## 2022-10-08 DIAGNOSIS — I2510 Atherosclerotic heart disease of native coronary artery without angina pectoris: Secondary | ICD-10-CM | POA: Diagnosis not present

## 2022-10-08 DIAGNOSIS — R051 Acute cough: Secondary | ICD-10-CM | POA: Diagnosis not present

## 2022-10-08 LAB — POCT RESPIRATORY SYNCYTIAL VIRUS: RSV Rapid Ag: NEGATIVE

## 2022-10-08 LAB — POC COVID19 BINAXNOW: SARS Coronavirus 2 Ag: NEGATIVE

## 2022-10-08 MED ORDER — DEXTROMETHORPHAN POLISTIREX ER 30 MG/5ML PO SUER: 30.0000 mg | Freq: Two times a day (BID) | ORAL | 0 refills | Status: DC

## 2022-10-08 MED ORDER — AMOXICILLIN-POT CLAVULANATE 875-125 MG PO TABS: 1.0000 | ORAL_TABLET | Freq: Two times a day (BID) | ORAL | 0 refills | Status: AC

## 2022-10-08 NOTE — Patient Instructions (Signed)

## 2022-10-08 NOTE — Progress Notes (Signed)
Subjective:  Patient ID: Peter Bachelor, MD, male    DOB: 11/09/1945  Age: 76 y.o. MRN: [id number]  CC: URI   HPI Peter Bachelor, MD presents for f/up -   He has had URI symptoms for about 3 weeks.  Over the last few days the symptoms has worsened.  He has had wheezing that is well-controlled with albuterol.  He complains that his nasal phlegm is worsening and has become yellow and thick.  He has a nonproductive cough, sore throat, and fatigue.  He denies fever, chills, night sweats, chest pain, or hemoptysis.  Outpatient Medications Prior to Visit  Medication Sig Dispense Refill   albuterol (VENTOLIN HFA) 108 (90 Base) MCG/ACT inhaler Inhale 2 puffs into the lungs every 6 (six) hours as needed for wheezing or shortness of breath. 8 g 2   alfuzosin (UROXATRAL) 10 MG 24 hr tablet Take 10 mg by mouth every evening.      amLODipine (NORVASC) 10 MG tablet Take 1 tablet (10 mg total) by mouth daily. 90 tablet 3   aspirin EC 81 MG tablet Take 81 mg by mouth at bedtime. Swallow whole.     benzonatate (TESSALON) 100 MG capsule Take 1 capsule (100 mg total) by mouth 2 (two) times daily as needed for cough. 20 capsule 0   celecoxib (CELEBREX) 100 MG capsule Take 1 capsule (100 mg total) by mouth 2 (two) times daily as needed. (Patient taking differently: Take 100 mg by mouth in the morning.) 60 capsule 3   fluticasone (CUTIVATE) 0.05 % cream Apply 1 application topically 2 (two) times daily.     indapamide (LOZOL) 1.25 MG tablet Take 1 tablet (1.25 mg total) by mouth daily. 90 tablet 0   montelukast (SINGULAIR) 10 MG tablet TAKE 1 TABLET BY MOUTH EVERYDAY AT BEDTIME (Patient taking differently: Take 10 mg by mouth at bedtime.) 90 tablet 0   olmesartan (BENICAR) 20 MG tablet Take 1 tablet (20 mg total) by mouth daily. 90 tablet 0   pantoprazole (PROTONIX) 40 MG tablet TAKE 1 TABLET BY MOUTH EVERY DAY 30 MINUTES BEFORE EVENING MEALS (Patient taking differently: Take 40 mg by mouth daily before supper.) 30  tablet 0   Probiotic Product (ALIGN) 4 MG CAPS Take 4 mg by mouth in the morning.     rosuvastatin (CRESTOR) 10 MG tablet Take 1 tablet (10 mg total) by mouth at bedtime. 90 tablet 1   traMADol (ULTRAM) 50 MG tablet Take 50 mg by mouth at bedtime.     No facility-administered medications prior to visit.    ROS Review of Systems  Constitutional:  Positive for fatigue. Negative for appetite change, chills and unexpected weight change.  HENT:  Positive for postnasal drip, rhinorrhea, sinus pressure, sinus pain and sore throat. Negative for ear pain, nosebleeds and trouble swallowing.   Respiratory:  Positive for cough and wheezing.   Cardiovascular:  Negative for chest pain, palpitations and leg swelling.  Gastrointestinal:  Negative for abdominal pain, diarrhea, nausea and vomiting.  Genitourinary: Negative.   Musculoskeletal: Negative.  Negative for arthralgias, joint swelling and myalgias.  Skin: Negative.  Negative for rash.  Neurological: Negative.  Negative for dizziness and weakness.  Hematological:  Negative for adenopathy. Does not bruise/bleed easily.  Psychiatric/Behavioral: Negative.      Objective:  BP 132/76 (BP Location: Right Arm, Patient Position: Sitting, Cuff Size: Large)   Pulse 80   Temp 97.8 F (36.6 C) (Oral)   Ht '5\' 9"'$  (1.753 m)  Wt 176 lb (79.8 kg)   SpO2 94%   BMI 25.99 kg/m   BP Readings from Last 3 Encounters:  10/08/22 132/76  10/05/22 (!) 162/84  04/21/22 (!) 150/72    Wt Readings from Last 3 Encounters:  10/08/22 176 lb (79.8 kg)  10/05/22 176 lb (79.8 kg)  04/21/22 175 lb (79.4 kg)    Physical Exam Vitals reviewed.  Constitutional:      Appearance: Normal appearance. He is not ill-appearing.  HENT:     Nose: Nose normal.     Mouth/Throat:     Mouth: Mucous membranes are moist.     Pharynx: No pharyngeal swelling, oropharyngeal exudate, posterior oropharyngeal erythema or uvula swelling.     Tonsils: No tonsillar exudate.  Eyes:      General: No scleral icterus.    Conjunctiva/sclera: Conjunctivae normal.  Cardiovascular:     Rate and Rhythm: Normal rate and regular rhythm.     Heart sounds: No murmur heard. Pulmonary:     Effort: Pulmonary effort is normal.     Breath sounds: No stridor. No wheezing, rhonchi or rales.  Abdominal:     General: Abdomen is flat.     Palpations: There is no mass.     Tenderness: There is no abdominal tenderness. There is no guarding or rebound.     Hernia: No hernia is present.  Musculoskeletal:        General: Normal range of motion.     Right lower leg: No edema.     Left lower leg: No edema.  Skin:    General: Skin is warm and dry.     Findings: No rash.  Neurological:     General: No focal deficit present.     Mental Status: He is alert.  Psychiatric:        Mood and Affect: Mood normal.        Behavior: Behavior normal.     Lab Results  Component Value Date   WBC 7.2 10/05/2022   HGB 15.4 10/05/2022   HCT 44.5 10/05/2022   PLT 277.0 10/05/2022   GLUCOSE 92 10/05/2022   CHOL 136 04/14/2022   TRIG 59 04/14/2022   HDL 66 04/14/2022   LDLDIRECT 151.6 04/10/2011   LDLCALC 58 04/14/2022   ALT 30 04/14/2022   AST 34 04/14/2022   NA 140 10/05/2022   K 4.5 10/05/2022   CL 102 10/05/2022   CREATININE 1.03 10/05/2022   BUN 19 10/05/2022   CO2 30 10/05/2022   TSH 1.81 10/05/2022   PSA 2.01 10/05/2022   INR 1.0 03/09/2017    MR CERVICAL SPINE WO CONTRAST  Result Date: 05/21/2022 CLINICAL DATA:  Peripheral neuropathy, neck pain; prior cervical surgery EXAM: MRI CERVICAL SPINE WITHOUT CONTRAST TECHNIQUE: Multiplanar, multisequence MR imaging of the cervical spine was performed. No intravenous contrast was administered. COMPARISON:  05/01/2018 MRI cervical spine FINDINGS: Alignment: Trace anterolisthesis of C2 on C3. Straightening of the normal cervical lordosis. Vertebrae: No acute fracture or suspicious osseous lesion. Redemonstrated subchondral cyst formation within  the odontoid the left of midline similar to prior with additional facet synovial cyst extending anteriorly and to the left from the atlantodental articulation (series 4, image 11), only seen on the sagittal images, new from the prior exam. Status post ACDF C5-C7, extended from the C6-C7 ACDF seen on the prior MRI, with solid arthrodesis across C6-C7 but not definitively across C5-C6. Cord: Cord deformation at C4-C5, described below. Otherwise normal in signal and morphology. Posterior Fossa,  vertebral arteries, paraspinal tissues: Negative. Disc levels: C2-C3: Trace anterolisthesis. No significant disc bulge. Right-greater-than-left facet and uncovertebral hypertrophy. No spinal canal stenosis. Severe right neural foraminal narrowing, similar to the prior exam. C3-C4: Mild disc bulge. Left-greater-than-right facet arthropathy. Uncovertebral hypertrophy. Ligamentum flavum hypertrophy. No spinal canal stenosis. Mild-to-moderate left neural foraminal narrowing, which has progressed from the prior exam. C4-C5: Moderate disc bulge, with progressive facet arthropathy and ligamentum flavum hypertrophy, left-greater-than-right, which have progressed since the prior exam. Severe spinal canal stenosis with cord deformation and possible compression on the left but no definite abnormal signal in the spinal cord. Mild left neural foraminal narrowing. C5-C6: Status post interval ACDF. No residual spinal canal stenosis. Uncovertebral and facet arthropathy. Mild left neural foraminal narrowing appears similar to the prior exam C6-C7: Status post ACDF. No spinal canal stenosis or neural foraminal narrowing. C7-T1: No significant disc bulge. No spinal canal stenosis or neuroforaminal narrowing. IMPRESSION: 1. Status post interval extension of ACDF to occlude C5-C6, with new adjacent segment disease at C4-C5, where there is severe spinal canal stenosis and cord deformation with possible compression on the left, without definite  abnormal spinal cord signal, as well as mild left neural foraminal narrowing. 2. Resolution of previously noted spinal canal stenosis at C5-C6, with mild residual left neural foraminal narrowing. 3. C3-C4 mild-to-moderate left neural foraminal narrowing, which has progressed from the prior exam. 4. C2-C3 severe right neural foraminal narrowing, unchanged. 5. Facet synovial cyst formation at C1-C2 at the left aspect of the atlantodental articulation, which does not involve the spinal canal, but suggests severe arthropathy at this C1-C2 articulation. Impression #1 will be called to the ordering clinician or representative by the Radiologist Assistant, and communication documented in the PACS or [employer]. Electronically Signed   By: Merilyn Baba [name]   On: 05/21/2022 11:44   MR Lumbar Spine W Wo Contrast  Result Date: 05/21/2022 CLINICAL DATA:  Peripheral neuropathy. Central low back pain. Bilateral foot numbness. EXAM: MRI LUMBAR SPINE WITHOUT AND WITH CONTRAST TECHNIQUE: Multiplanar and multiecho pulse sequences of the lumbar spine were obtained without and with intravenous contrast. CONTRAST:  15 cc MultiHance COMPARISON:  Radiography 08/02/2020.  MRI 09/28/2018. FINDINGS: Segmentation:  5 lumbar type vertebral bodies. Alignment:  Degenerative retrolisthesis at L1-2 of 7 mm. Vertebrae: Discogenic endplate marrow changes at L1-2 with mild edema. Previous discectomy and fusion procedure from L2 through L5 with presumed solid union and no complicating features. Conus medullaris and cauda equina: Conus extends to the L1 level. Conus and cauda equina appear normal. Paraspinal and other soft tissues: Normal Disc levels: T12-L1: Minimal disc bulge. Minimal ligamentous prominence. No stenosis. No change since 2019. L1-2: Progressive disc degeneration with loss of disc height, endplate osteophytes and shallow protrusion of the disc. Retrolisthesis of 7 mm, increased from 5 mm previously. Discogenic endplate  marrow changes which could contribute to back pain. Moderate stenosis of the canal, lateral recesses and neural foramina that could cause symptoms of central spinal stenosis or focal nerve root compression on either side. L2 through L5: Previous posterior decompression, diskectomy and fusion. Apparent solid fusion throughout the region without stenosis or neural compression discernible. 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 L5-S1: Chronic disc degeneration with loss of disc height, endplate osteophytes and bulging of the disc. Facet degeneration and hypertrophy without edema. No central canal stenosis. Moderate bilateral foraminal narrowing. Findings are unchanged since 2019. IMPRESSION: Good appearance in the fusion segment from L2  through L5. Some clustering of nerve roots within the thecal sac suggest mild arachnoiditis, similar to the study of 2019. Worsening degenerative disease at L1-2. Retrolisthesis of 7 mm, increased from 5 mm previously. Endplate osteophytes and shallow protrusion of the disc. Moderate stenosis of the canal, lateral recesses and neural foramina could cause symptoms of central stenosis or focal nerve root compression on either side. Discogenic endplate marrow changes with some edema could relate to regional pain. Chronic and unchanged degenerative changes at L5-S1 with moderate bilateral foraminal narrowing. Electronically Signed   By: Nelson Chimes [name]   On: 05/21/2022 11:28    DG Chest 2 View  Result Date: 10/08/2022 CLINICAL DATA:  Cough for 2 weeks EXAM: CHEST - 2 VIEW COMPARISON:  02/24/2018 FINDINGS: Frontal and lateral views of the chest demonstrate an unremarkable cardiac silhouette. No airspace disease, effusion, or pneumothorax. No acute bony abnormalities. IMPRESSION: 1. No acute intrathoracic process. Electronically Signed   By: Randa Ngo [name]   On: 10/08/2022 16:09     Assessment & Plan:    Angie was seen today for uri.  Diagnoses and all orders for this visit:  Acute cough- Chest x-ray is negative for mass or infiltrate.  Testing for COVID and RSV is negative. -     DG Chest 2 View; Future -     POC COVID-19 -     POCT respiratory syncytial virus -     dextromethorphan (DELSYM) 30 MG/5ML liquid; Take 5 mLs (30 mg total) by mouth 2 (two) times daily.  Acute non-recurrent maxillary sinusitis-Will treat with Augmentin. -     amoxicillin-clavulanate (AUGMENTIN) 875-125 MG tablet; Take 1 tablet by mouth 2 (two) times daily for 10 days.   I am having Dr. Eddie Dibbles D. [name], MD start on amoxicillin-clavulanate and dextromethorphan. I am also having him maintain his alfuzosin, pantoprazole, montelukast, celecoxib, fluticasone, traMADol, aspirin EC, Align, amLODipine, albuterol, benzonatate, olmesartan, indapamide, and rosuvastatin.  Meds ordered this encounter  Medications   amoxicillin-clavulanate (AUGMENTIN) 875-125 MG tablet    Sig: Take 1 tablet by mouth 2 (two) times daily for 10 days.    Dispense:  20 tablet    Refill:  0   dextromethorphan (DELSYM) 30 MG/5ML liquid    Sig: Take 5 mLs (30 mg total) by mouth 2 (two) times daily.    Dispense:  148 mL    Refill:  0     Follow-up: Return if symptoms worsen or fail to improve.  Scarlette Calico, MD

## 2022-10-13 ENCOUNTER — Ambulatory Visit (INDEPENDENT_AMBULATORY_CARE_PROVIDER_SITE_OTHER): Payer: Medicare Other | Admitting: Internal Medicine

## 2022-10-13 ENCOUNTER — Encounter: Payer: Self-pay | Admitting: Internal Medicine

## 2022-10-13 ENCOUNTER — Ambulatory Visit: Payer: Medicare Other | Attending: Internal Medicine

## 2022-10-13 VITALS — BP 132/78 | HR 77 | Temp 97.6°F | Resp 16 | Ht 69.0 in | Wt 174.2 lb

## 2022-10-13 DIAGNOSIS — R002 Palpitations: Secondary | ICD-10-CM

## 2022-10-13 DIAGNOSIS — I2510 Atherosclerotic heart disease of native coronary artery without angina pectoris: Secondary | ICD-10-CM | POA: Diagnosis not present

## 2022-10-13 DIAGNOSIS — R0989 Other specified symptoms and signs involving the circulatory and respiratory systems: Secondary | ICD-10-CM | POA: Diagnosis not present

## 2022-10-13 DIAGNOSIS — I10 Essential (primary) hypertension: Secondary | ICD-10-CM | POA: Diagnosis not present

## 2022-10-13 NOTE — Patient Instructions (Signed)
Palpitations Palpitations are feelings that your heartbeat is irregular or is faster than normal. It may feel like your heart is fluttering or skipping a beat. Palpitations may be caused by many things, including smoking, caffeine, alcohol, stress, and certain medicines or drugs. Most causes of palpitations are not serious.  However, some palpitations can be a sign of a serious problem. Further tests and a thorough medical history will be done to find the cause of your palpitations. Your provider may order tests such as an ECG, labs, an echocardiogram, or an ambulatory continuous ECG monitor. Follow these instructions at home: Pay attention to any changes in your symptoms. Let your health care provider know about them. Take these actions to help manage your symptoms: Eating and drinking Follow instructions from your health care provider about eating or drinking restrictions. You may need to avoid foods and drinks that may cause palpitations. These may include: Caffeinated coffee, tea, soft drinks, and energy drinks. Chocolate. Alcohol. Diet pills. Lifestyle     Take steps to reduce your stress and anxiety. Things that can help you relax include: Yoga. Mind-body activities, such as deep breathing, meditation, or using words and images to create positive thoughts (guided imagery). Physical activity, such as swimming, jogging, or walking. Tell your health care provider if your palpitations increase with activity. If you have chest pain or shortness of breath with activity, do not continue the activity until you are seen by your health care provider. Biofeedback. This is a method that helps you learn to use your mind to control things in your body, such as your heartbeat. Get plenty of rest and sleep. Keep a regular bed time. Do not use drugs, including cocaine or ecstasy. Do not use marijuana. Do not use any products that contain nicotine or tobacco. These products include cigarettes, chewing  tobacco, and vaping devices, such as e-cigarettes. If you need help quitting, ask your health care provider. General instructions Take over-the-counter and prescription medicines only as told by your health care provider. Keep all follow-up visits. This is important. These may include visits for further testing if palpitations do not go away or get worse. Contact a health care provider if: You continue to have a fast or irregular heartbeat for a long period of time. You notice that your palpitations occur more often. Get help right away if: You have chest pain or shortness of breath. You have a severe headache. You feel dizzy or you faint. These symptoms may represent a serious problem that is an emergency. Do not wait to see if the symptoms will go away. Get medical help right away. Call your local emergency services (911 in the [place] Do not drive yourself to the hospital. Summary Palpitations are feelings that your heartbeat is irregular or is faster than normal. It may feel like your heart is fluttering or skipping a beat. Palpitations may be caused by many things, including smoking, caffeine, alcohol, stress, certain medicines, and drugs. Further tests and a thorough medical history may be done to find the cause of your palpitations. Get help right away if you faint or have chest pain, shortness of breath, severe headache, or dizziness. This information is not intended to replace advice given to you by your health care provider. Make sure you discuss any questions you have with your health care provider. Document Revised: 03/05/2021 Document Reviewed: 03/05/2021 Elsevier Patient Education  2023 Elsevier Inc.  

## 2022-10-13 NOTE — Progress Notes (Unsigned)
Enrolled for [employer] to mail a ZIO XT long term holter monitor to the patients address on file.   Dr. Nishan to read. 

## 2022-10-13 NOTE — Progress Notes (Unsigned)
Subjective:  Patient ID: Peter Bachelor, MD, male    DOB: [birth year]  Age: 76 y.o. MRN: [id number]  CC: Hypertension   HPI Peter Bachelor, MD presents for f/up -  He complains of a 1 week history of heart rate elevation into the 80s.  He tells me his usual heart rate is 60-70.  He is active and denies dizziness, lightheadedness, chest pain, shortness of breath, near-syncope, or syncope.  Outpatient Medications Prior to Visit  Medication Sig Dispense Refill   albuterol (VENTOLIN HFA) 108 (90 Base) MCG/ACT inhaler Inhale 2 puffs into the lungs every 6 (six) hours as needed for wheezing or shortness of breath. 8 g 2   alfuzosin (UROXATRAL) 10 MG 24 hr tablet Take 10 mg by mouth every evening.      amLODipine (NORVASC) 10 MG tablet Take 1 tablet (10 mg total) by mouth daily. 90 tablet 3   amoxicillin-clavulanate (AUGMENTIN) 875-125 MG tablet Take 1 tablet by mouth 2 (two) times daily for 10 days. 20 tablet 0   aspirin EC 81 MG tablet Take 81 mg by mouth at bedtime. Swallow whole.     benzonatate (TESSALON) 100 MG capsule Take 1 capsule (100 mg total) by mouth 2 (two) times daily as needed for cough. 20 capsule 0   celecoxib (CELEBREX) 100 MG capsule Take 1 capsule (100 mg total) by mouth 2 (two) times daily as needed. (Patient taking differently: Take 100 mg by mouth in the morning.) 60 capsule 3   dextromethorphan (DELSYM) 30 MG/5ML liquid Take 5 mLs (30 mg total) by mouth 2 (two) times daily. 148 mL 0   fluticasone (CUTIVATE) 0.05 % cream Apply 1 application topically 2 (two) times daily.     indapamide (LOZOL) 1.25 MG tablet Take 1 tablet (1.25 mg total) by mouth daily. 90 tablet 0   montelukast (SINGULAIR) 10 MG tablet TAKE 1 TABLET BY MOUTH EVERYDAY AT BEDTIME (Patient taking differently: Take 10 mg by mouth at bedtime.) 90 tablet 0   olmesartan (BENICAR) 20 MG tablet Take 1 tablet (20 mg total) by mouth daily. 90 tablet 0   pantoprazole (PROTONIX) 40 MG tablet TAKE 1 TABLET BY MOUTH EVERY  DAY 30 MINUTES BEFORE EVENING MEALS (Patient taking differently: Take 40 mg by mouth daily before supper.) 30 tablet 0   Probiotic Product (ALIGN) 4 MG CAPS Take 4 mg by mouth in the morning.     rosuvastatin (CRESTOR) 10 MG tablet Take 1 tablet (10 mg total) by mouth at bedtime. 90 tablet 1   traMADol (ULTRAM) 50 MG tablet Take 50 mg by mouth at bedtime.     No facility-administered medications prior to visit.    ROS Review of Systems  Constitutional: Negative.  Negative for chills, fatigue and fever.  HENT: Negative.    Eyes: Negative.   Respiratory:  Negative for shortness of breath and wheezing.   Cardiovascular:  Positive for palpitations. Negative for chest pain and leg swelling.  Gastrointestinal:  Negative for abdominal pain, diarrhea, nausea and vomiting.  Endocrine: Negative.   Genitourinary: Negative.  Negative for difficulty urinating and hematuria.  Musculoskeletal: Negative.  Negative for arthralgias and myalgias.  Skin: Negative.   Neurological:  Negative for dizziness, weakness and light-headedness.  Hematological:  Negative for adenopathy. Does not bruise/bleed easily.  Psychiatric/Behavioral: Negative.  Negative for agitation.     Objective:  BP 132/78 (BP Location: Left Arm, Patient Position: Sitting, Cuff Size: Normal)   Pulse 77   Temp 97.6 F (36.4  C) (Oral)   Resp 16   Ht '5\' 9"'$  (1.753 m)   Wt 174 lb 3.2 oz (79 kg)   SpO2 94%   BMI 25.72 kg/m   BP Readings from Last 3 Encounters:  10/13/22 132/78  10/08/22 132/76  10/05/22 (!) 162/84    Wt Readings from Last 3 Encounters:  10/13/22 174 lb 3.2 oz (79 kg)  10/08/22 176 lb (79.8 kg)  10/05/22 176 lb (79.8 kg)    Physical Exam Vitals reviewed.  HENT:     Mouth/Throat:     Mouth: Mucous membranes are moist.  Eyes:     General: No scleral icterus.    Conjunctiva/sclera: Conjunctivae normal.  Cardiovascular:     Rate and Rhythm: Normal rate and regular rhythm.     Heart sounds: Normal heart  sounds, S1 normal and S2 normal. No murmur heard.    No friction rub. No gallop.     Comments: EKG-  NSR, 83 bpm Normal EKG Pulmonary:     Effort: Pulmonary effort is normal.     Breath sounds: No stridor. No wheezing, rhonchi or rales.  Abdominal:     General: Abdomen is flat.     Palpations: There is no mass.     Tenderness: There is no abdominal tenderness. There is no guarding.     Hernia: No hernia is present.  Musculoskeletal:     Cervical back: Neck supple.     Right lower leg: No edema.     Left lower leg: No edema.  Lymphadenopathy:     Cervical: No cervical adenopathy.  Skin:    General: Skin is warm and dry.  Neurological:     General: No focal deficit present.     Mental Status: He is alert.  Psychiatric:        Mood and Affect: Mood normal.     Lab Results  Component Value Date   WBC 7.2 10/05/2022   HGB 15.4 10/05/2022   HCT 44.5 10/05/2022   PLT 277.0 10/05/2022   GLUCOSE 92 10/05/2022   CHOL 136 04/14/2022   TRIG 59 04/14/2022   HDL 66 04/14/2022   LDLDIRECT 151.6 04/10/2011   LDLCALC 58 04/14/2022   ALT 30 04/14/2022   AST 34 04/14/2022   NA 140 10/05/2022   K 4.5 10/05/2022   CL 102 10/05/2022   CREATININE 1.03 10/05/2022   BUN 19 10/05/2022   CO2 30 10/05/2022   TSH 1.81 10/05/2022   PSA 2.01 10/05/2022   INR 1.0 03/09/2017    MR CERVICAL SPINE WO CONTRAST  Result Date: 05/21/2022 CLINICAL DATA:  Peripheral neuropathy, neck pain; prior cervical surgery EXAM: MRI CERVICAL SPINE WITHOUT CONTRAST TECHNIQUE: Multiplanar, multisequence MR imaging of the cervical spine was performed. No intravenous contrast was administered. COMPARISON:  05/01/2018 MRI cervical spine FINDINGS: Alignment: Trace anterolisthesis of C2 on C3. Straightening of the normal cervical lordosis. Vertebrae: No acute fracture or suspicious osseous lesion. Redemonstrated subchondral cyst formation within the odontoid the left of midline similar to prior with additional facet  synovial cyst extending anteriorly and to the left from the atlantodental articulation (series 4, image 11), only seen on the sagittal images, new from the prior exam. Status post ACDF C5-C7, extended from the C6-C7 ACDF seen on the prior MRI, with solid arthrodesis across C6-C7 but not definitively across C5-C6. Cord: Cord deformation at C4-C5, described below. Otherwise normal in signal and morphology. Posterior Fossa, vertebral arteries, paraspinal tissues: Negative. Disc levels: C2-C3: Trace anterolisthesis. No significant  disc bulge. Right-greater-than-left facet and uncovertebral hypertrophy. No spinal canal stenosis. Severe right neural foraminal narrowing, similar to the prior exam. C3-C4: Mild disc bulge. Left-greater-than-right facet arthropathy. Uncovertebral hypertrophy. Ligamentum flavum hypertrophy. No spinal canal stenosis. Mild-to-moderate left neural foraminal narrowing, which has progressed from the prior exam. C4-C5: Moderate disc bulge, with progressive facet arthropathy and ligamentum flavum hypertrophy, left-greater-than-right, which have progressed since the prior exam. Severe spinal canal stenosis with cord deformation and possible compression on the left but no definite abnormal signal in the spinal cord. Mild left neural foraminal narrowing. C5-C6: Status post interval ACDF. No residual spinal canal stenosis. Uncovertebral and facet arthropathy. Mild left neural foraminal narrowing appears similar to the prior exam C6-C7: Status post ACDF. No spinal canal stenosis or neural foraminal narrowing. C7-T1: No significant disc bulge. No spinal canal stenosis or neuroforaminal narrowing. IMPRESSION: 1. Status post interval extension of ACDF to occlude C5-C6, with new adjacent segment disease at C4-C5, where there is severe spinal canal stenosis and cord deformation with possible compression on the left, without definite abnormal spinal cord signal, as well as mild left neural foraminal narrowing.  2. Resolution of previously noted spinal canal stenosis at C5-C6, with mild residual left neural foraminal narrowing. 3. C3-C4 mild-to-moderate left neural foraminal narrowing, which has progressed from the prior exam. 4. C2-C3 severe right neural foraminal narrowing, unchanged. 5. Facet synovial cyst formation at C1-C2 at the left aspect of the atlantodental articulation, which does not involve the spinal canal, but suggests severe arthropathy at this C1-C2 articulation. Impression #1 will be called to the ordering clinician or representative by the Radiologist Assistant, and communication documented in the PACS or [employer]. Electronically Signed   By: Merilyn Baba [name]   On: 05/21/2022 11:44   MR Lumbar Spine W Wo Contrast  Result Date: 05/21/2022 CLINICAL DATA:  Peripheral neuropathy. Central low back pain. Bilateral foot numbness. EXAM: MRI LUMBAR SPINE WITHOUT AND WITH CONTRAST TECHNIQUE: Multiplanar and multiecho pulse sequences of the lumbar spine were obtained without and with intravenous contrast. CONTRAST:  15 cc MultiHance COMPARISON:  Radiography 08/02/2020.  MRI 09/28/2018. FINDINGS: Segmentation:  5 lumbar type vertebral bodies. Alignment:  Degenerative retrolisthesis at L1-2 of 7 mm. Vertebrae: Discogenic endplate marrow changes at L1-2 with mild edema. Previous discectomy and fusion procedure from L2 through L5 with presumed solid union and no complicating features. Conus medullaris and cauda equina: Conus extends to the L1 level. Conus and cauda equina appear normal. Paraspinal and other soft tissues: Normal Disc levels: T12-L1: Minimal disc bulge. Minimal ligamentous prominence. No stenosis. No change since 2019. L1-2: Progressive disc degeneration with loss of disc height, endplate osteophytes and shallow protrusion of the disc. Retrolisthesis of 7 mm, increased from 5 mm previously. Discogenic endplate marrow changes which could contribute to back pain. Moderate stenosis of the  canal, lateral recesses and neural foramina that could cause symptoms of central spinal stenosis or focal nerve root compression on either side. L2 through L5: Previous posterior decompression, diskectomy and fusion. Apparent solid fusion throughout the region without stenosis or neural compression discernible. 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 L5-S1: Chronic disc degeneration with loss of disc height, endplate osteophytes and bulging of the disc. Facet degeneration and hypertrophy without edema. No central canal stenosis. Moderate bilateral foraminal narrowing. Findings are unchanged since 2019. IMPRESSION: Good appearance in the fusion segment from L2 through L5. Some clustering of nerve roots within the thecal sac suggest  mild arachnoiditis, similar to the study of 2019. Worsening degenerative disease at L1-2. Retrolisthesis of 7 mm, increased from 5 mm previously. Endplate osteophytes and shallow protrusion of the disc. Moderate stenosis of the canal, lateral recesses and neural foramina could cause symptoms of central stenosis or focal nerve root compression on either side. Discogenic endplate marrow changes with some edema could relate to regional pain. Chronic and unchanged degenerative changes at L5-S1 with moderate bilateral foraminal narrowing. Electronically Signed   By: Nelson Chimes [name]   On: 05/21/2022 11:28    Assessment & Plan:   Taelyn was seen today for hypertension.  Diagnoses and all orders for this visit:  Palpitations- His EKG is normal.  I have asked him to undergo a event monitor to evaluate for dysrhythmias. -     EKG 12-Lead -     Cancel: Holter monitor - 72 hour; Future -     LONG TERM MONITOR (3-14 DAYS); Future   Primary hypertension- His blood pressure is well-controlled.   I am having Dr. Eddie Dibbles D. [name], MD maintain his alfuzosin, pantoprazole, montelukast, celecoxib, fluticasone, traMADol,  aspirin EC, Align, amLODipine, albuterol, benzonatate, olmesartan, indapamide, rosuvastatin, amoxicillin-clavulanate, and dextromethorphan.  No orders of the defined types were placed in this encounter.    Follow-up: Return in about 3 months (around 01/12/2023).  Scarlette Calico, MD

## 2022-10-15 ENCOUNTER — Ambulatory Visit (INDEPENDENT_AMBULATORY_CARE_PROVIDER_SITE_OTHER)
Admission: RE | Admit: 2022-10-15 | Discharge: 2022-10-15 | Disposition: A | Discharge status: Home / Self Care | Payer: Medicare Other | Source: Ambulatory Visit | Attending: Internal Medicine | Admitting: Internal Medicine

## 2022-10-15 ENCOUNTER — Ambulatory Visit (HOSPITAL_COMMUNITY)
Admission: RE | Admit: 2022-10-15 | Discharge: 2022-10-15 | Disposition: A | Discharge status: Home / Self Care | Payer: Medicare Other | Source: Ambulatory Visit | Attending: Internal Medicine | Admitting: Internal Medicine

## 2022-10-15 DIAGNOSIS — I10 Essential (primary) hypertension: Secondary | ICD-10-CM

## 2022-10-15 DIAGNOSIS — R0989 Other specified symptoms and signs involving the circulatory and respiratory systems: Secondary | ICD-10-CM | POA: Diagnosis not present

## 2022-10-19 DIAGNOSIS — R002 Palpitations: Secondary | ICD-10-CM | POA: Diagnosis not present

## 2022-10-21 DIAGNOSIS — J029 Acute pharyngitis, unspecified: Secondary | ICD-10-CM | POA: Diagnosis not present

## 2022-10-22 ENCOUNTER — Other Ambulatory Visit: Payer: Self-pay | Admitting: Neurological Surgery

## 2022-10-22 DIAGNOSIS — M4716 Other spondylosis with myelopathy, lumbar region: Secondary | ICD-10-CM

## 2022-10-25 ENCOUNTER — Other Ambulatory Visit: Payer: Self-pay | Admitting: Internal Medicine

## 2022-10-25 ENCOUNTER — Encounter: Payer: Self-pay | Admitting: Internal Medicine

## 2022-10-27 ENCOUNTER — Encounter: Payer: Self-pay | Admitting: Internal Medicine

## 2022-10-27 ENCOUNTER — Other Ambulatory Visit: Payer: Self-pay | Admitting: Internal Medicine

## 2022-10-27 DIAGNOSIS — M1189 Other specified crystal arthropathies, multiple sites: Secondary | ICD-10-CM

## 2022-10-27 DIAGNOSIS — M1712 Unilateral primary osteoarthritis, left knee: Secondary | ICD-10-CM

## 2022-10-27 DIAGNOSIS — M48062 Spinal stenosis, lumbar region with neurogenic claudication: Secondary | ICD-10-CM

## 2022-10-27 DIAGNOSIS — K219 Gastro-esophageal reflux disease without esophagitis: Secondary | ICD-10-CM

## 2022-10-27 DIAGNOSIS — M159 Polyosteoarthritis, unspecified: Secondary | ICD-10-CM

## 2022-10-27 DIAGNOSIS — M5136 Other intervertebral disc degeneration, lumbar region: Secondary | ICD-10-CM

## 2022-10-27 DIAGNOSIS — M5020 Other cervical disc displacement, unspecified cervical region: Secondary | ICD-10-CM

## 2022-10-27 DIAGNOSIS — M150 Primary generalized (osteo)arthritis: Secondary | ICD-10-CM

## 2022-10-27 DIAGNOSIS — M51369 Other intervertebral disc degeneration, lumbar region without mention of lumbar back pain or lower extremity pain: Secondary | ICD-10-CM

## 2022-10-27 DIAGNOSIS — M4716 Other spondylosis with myelopathy, lumbar region: Secondary | ICD-10-CM

## 2022-10-27 DIAGNOSIS — M1711 Unilateral primary osteoarthritis, right knee: Secondary | ICD-10-CM

## 2022-10-27 MED ORDER — PANTOPRAZOLE SODIUM 40 MG PO TBEC: DELAYED_RELEASE_TABLET | ORAL | 1 refills | Status: DC

## 2022-10-27 MED ORDER — TRAMADOL HCL 50 MG PO TABS: 50.0000 mg | ORAL_TABLET | Freq: Every day | ORAL | 1 refills | Status: DC

## 2022-10-30 DIAGNOSIS — R002 Palpitations: Secondary | ICD-10-CM | POA: Diagnosis not present

## 2022-11-01 ENCOUNTER — Encounter: Payer: Self-pay | Admitting: Internal Medicine

## 2022-11-02 ENCOUNTER — Other Ambulatory Visit: Payer: Self-pay | Admitting: Internal Medicine

## 2022-11-02 ENCOUNTER — Encounter: Payer: Self-pay | Admitting: Internal Medicine

## 2022-11-02 DIAGNOSIS — B3781 Candidal esophagitis: Secondary | ICD-10-CM | POA: Insufficient documentation

## 2022-11-02 DIAGNOSIS — I471 Supraventricular tachycardia, unspecified: Secondary | ICD-10-CM

## 2022-11-02 MED ORDER — FLUCONAZOLE 100 MG PO TABS: 100.0000 mg | ORAL_TABLET | Freq: Every day | ORAL | 0 refills | Status: DC

## 2022-11-03 ENCOUNTER — Encounter: Payer: Self-pay | Admitting: Internal Medicine

## 2022-11-03 DIAGNOSIS — H16291 Other keratoconjunctivitis, right eye: Secondary | ICD-10-CM | POA: Diagnosis not present

## 2022-11-03 DIAGNOSIS — H1131 Conjunctival hemorrhage, right eye: Secondary | ICD-10-CM | POA: Diagnosis not present

## 2022-11-06 ENCOUNTER — Other Ambulatory Visit: Payer: Self-pay | Admitting: Internal Medicine

## 2022-11-06 DIAGNOSIS — I471 Supraventricular tachycardia, unspecified: Secondary | ICD-10-CM | POA: Insufficient documentation

## 2022-11-08 ENCOUNTER — Encounter: Payer: Self-pay | Admitting: Internal Medicine

## 2022-11-09 ENCOUNTER — Other Ambulatory Visit: Payer: Self-pay | Admitting: Internal Medicine

## 2022-11-09 DIAGNOSIS — B3781 Candidal esophagitis: Secondary | ICD-10-CM

## 2022-11-09 MED ORDER — FLUCONAZOLE 100 MG PO TABS: 100.0000 mg | ORAL_TABLET | Freq: Every day | ORAL | 0 refills | Status: AC

## 2022-11-10 ENCOUNTER — Encounter: Payer: Self-pay | Admitting: Internal Medicine

## 2022-11-16 ENCOUNTER — Encounter: Payer: Self-pay | Admitting: Internal Medicine

## 2022-11-17 DIAGNOSIS — M6281 Muscle weakness (generalized): Secondary | ICD-10-CM | POA: Diagnosis not present

## 2022-11-17 DIAGNOSIS — M47812 Spondylosis without myelopathy or radiculopathy, cervical region: Secondary | ICD-10-CM | POA: Diagnosis not present

## 2022-11-17 DIAGNOSIS — M542 Cervicalgia: Secondary | ICD-10-CM | POA: Diagnosis not present

## 2022-11-17 DIAGNOSIS — R269 Unspecified abnormalities of gait and mobility: Secondary | ICD-10-CM | POA: Diagnosis not present

## 2022-11-23 ENCOUNTER — Ambulatory Visit (INDEPENDENT_AMBULATORY_CARE_PROVIDER_SITE_OTHER): Payer: Medicare Other | Admitting: Internal Medicine

## 2022-11-23 ENCOUNTER — Encounter: Payer: Self-pay | Admitting: Internal Medicine

## 2022-11-23 VITALS — BP 122/78 | HR 77 | Temp 97.7°F | Ht 69.0 in | Wt 177.2 lb

## 2022-11-23 DIAGNOSIS — I471 Supraventricular tachycardia, unspecified: Secondary | ICD-10-CM

## 2022-11-23 DIAGNOSIS — I10 Essential (primary) hypertension: Secondary | ICD-10-CM | POA: Diagnosis not present

## 2022-11-23 DIAGNOSIS — K219 Gastro-esophageal reflux disease without esophagitis: Secondary | ICD-10-CM

## 2022-11-23 MED ORDER — PANTOPRAZOLE SODIUM 40 MG PO TBEC: 40.0000 mg | DELAYED_RELEASE_TABLET | Freq: Two times a day (BID) | ORAL | 0 refills | Status: DC

## 2022-11-23 NOTE — Patient Instructions (Signed)
Gastroesophageal Reflux Disease, Adult Gastroesophageal reflux (GER) happens when acid from the stomach flows up into the tube that connects the mouth and the stomach (esophagus). Normally, food travels down the esophagus and stays in the stomach to be digested. However, when a person has GER, food and stomach acid sometimes move back up into the esophagus. If this becomes a more serious problem, the person may be diagnosed with a disease called gastroesophageal reflux disease (GERD). GERD occurs when the reflux: Happens often. Causes frequent or severe symptoms. Causes problems such as damage to the esophagus. When stomach acid comes in contact with the esophagus, the acid may cause inflammation in the esophagus. Over time, GERD may create small holes (ulcers) in the lining of the esophagus. What are the causes? This condition is caused by a problem with the muscle between the esophagus and the stomach (lower esophageal sphincter, or LES). Normally, the LES muscle closes after food passes through the esophagus to the stomach. When the LES is weakened or abnormal, it does not close properly, and that allows food and stomach acid to go back up into the esophagus. The LES can be weakened by certain dietary substances, medicines, and medical conditions, including: Tobacco use. Pregnancy. Having a hiatal hernia. Alcohol use. Certain foods and beverages, such as coffee, chocolate, onions, and peppermint. What increases the risk? You are more likely to develop this condition if you: Have an increased body weight. Have a connective tissue disorder. Take NSAIDs, such as ibuprofen. What are the signs or symptoms? Symptoms of this condition include: Heartburn. Difficult or painful swallowing and the feeling of having a lump in the throat. A bitter taste in the mouth. Bad breath and having a large amount of saliva. Having an upset or bloated stomach and belching. Chest pain. Different conditions can  cause chest pain. Make sure you see your health care provider if you experience chest pain. Shortness of breath or wheezing. Ongoing (chronic) cough or a nighttime cough. Wearing away of tooth enamel. Weight loss. How is this diagnosed? This condition may be diagnosed based on a medical history and a physical exam. To determine if you have mild or severe GERD, your health care provider may also monitor how you respond to treatment. You may also have tests, including: A test to examine your stomach and esophagus with a small camera (endoscopy). A test that measures the acidity level in your esophagus. A test that measures how much pressure is on your esophagus. A barium swallow or modified barium swallow test to show the shape, size, and functioning of your esophagus. How is this treated? Treatment for this condition may vary depending on how severe your symptoms are. Your health care provider may recommend: Changes to your diet. Medicine. Surgery. The goal of treatment is to help relieve your symptoms and to prevent complications. Follow these instructions at home: Eating and drinking  Follow a diet as recommended by your health care provider. This may involve avoiding foods and drinks such as: Coffee and tea, with or without caffeine. Drinks that contain alcohol. Energy drinks and sports drinks. Carbonated drinks or sodas. Chocolate and cocoa. Peppermint and mint flavorings. Garlic and onions. Horseradish. Spicy and acidic foods, including peppers, chili powder, curry powder, vinegar, hot sauces, and barbecue sauce. Citrus fruit juices and citrus fruits, such as oranges, lemons, and limes. Tomato-based foods, such as red sauce, chili, salsa, and pizza with red sauce. Fried and fatty foods, such as donuts, french fries, potato chips, and high-fat dressings.   High-fat meats, such as hot dogs and fatty cuts of red and white meats, such as rib eye steak, sausage, ham, and  bacon. High-fat dairy items, such as whole milk, butter, and cream cheese. Eat small, frequent meals instead of large meals. Avoid drinking large amounts of liquid with your meals. Avoid eating meals during the 2-3 hours before bedtime. Avoid lying down right after you eat. Do not exercise right after you eat. Lifestyle  Do not use any products that contain nicotine or tobacco. These products include cigarettes, chewing tobacco, and vaping devices, such as e-cigarettes. If you need help quitting, ask your health care provider. Try to reduce your stress by using methods such as yoga or meditation. If you need help reducing stress, ask your health care provider. If you are overweight, reduce your weight to an amount that is healthy for you. Ask your health care provider for guidance about a safe weight loss goal. General instructions Pay attention to any changes in your symptoms. Take over-the-counter and prescription medicines only as told by your health care provider. Do not take aspirin, ibuprofen, or other NSAIDs unless your health care provider told you to take these medicines. Wear loose-fitting clothing. Do not wear anything tight around your waist that causes pressure on your abdomen. Raise (elevate) the head of your bed about 6 inches (15 cm). You can use a wedge to do this. Avoid bending over if this makes your symptoms worse. Keep all follow-up visits. This is important. Contact a health care provider if: You have: New symptoms. Unexplained weight loss. Difficulty swallowing or it hurts to swallow. Wheezing or a persistent cough. A hoarse voice. Your symptoms do not improve with treatment. Get help right away if: You have sudden pain in your arms, neck, jaw, teeth, or back. You suddenly feel sweaty, dizzy, or light-headed. You have chest pain or shortness of breath. You vomit and the vomit is green, yellow, or black, or it looks like blood or coffee grounds. You faint. You  have stool that is red, bloody, or black. You cannot swallow, drink, or eat. These symptoms may represent a serious problem that is an emergency. Do not wait to see if the symptoms will go away. Get medical help right away. Call your local emergency services (911 in the [place] Do not drive yourself to the hospital. Summary Gastroesophageal reflux happens when acid from the stomach flows up into the esophagus. GERD is a disease in which the reflux happens often, causes frequent or severe symptoms, or causes problems such as damage to the esophagus. Treatment for this condition may vary depending on how severe your symptoms are. Your health care provider may recommend diet and lifestyle changes, medicine, or surgery. Contact a health care provider if you have new or worsening symptoms. Take over-the-counter and prescription medicines only as told by your health care provider. Do not take aspirin, ibuprofen, or other NSAIDs unless your health care provider told you to do so. Keep all follow-up visits as told by your health care provider. This is important. This information is not intended to replace advice given to you by your health care provider. Make sure you discuss any questions you have with your health care provider. Document Revised: 04/22/2020 Document Reviewed: 04/22/2020 Elsevier Patient Education  2023 [employer] Inc.  

## 2022-11-23 NOTE — Progress Notes (Unsigned)
Subjective:  Patient ID: Peter Bachelor, MD, male    DOB: [birth year]  Age: 77 y.o. MRN: [id number]  CC: No chief complaint on file.   HPI Peter Bachelor, MD presents for ***  Outpatient Medications Prior to Visit  Medication Sig Dispense Refill   alfuzosin (UROXATRAL) 10 MG 24 hr tablet Take 10 mg by mouth every evening.      amLODipine (NORVASC) 10 MG tablet Take 1 tablet (10 mg total) by mouth daily. 90 tablet 3   aspirin EC 81 MG tablet Take 81 mg by mouth at bedtime. Swallow whole.     benzonatate (TESSALON) 100 MG capsule Take 1 capsule (100 mg total) by mouth 2 (two) times daily as needed for cough. 20 capsule 0   celecoxib (CELEBREX) 100 MG capsule Take 1 capsule (100 mg total) by mouth 2 (two) times daily as needed. (Patient taking differently: Take 100 mg by mouth in the morning.) 60 capsule 3   dextromethorphan (DELSYM) 30 MG/5ML liquid Take 5 mLs (30 mg total) by mouth 2 (two) times daily. 148 mL 0   fluticasone (CUTIVATE) 0.05 % cream Apply 1 application topically 2 (two) times daily.     indapamide (LOZOL) 1.25 MG tablet Take 1 tablet (1.25 mg total) by mouth daily. 90 tablet 0   montelukast (SINGULAIR) 10 MG tablet TAKE 1 TABLET BY MOUTH EVERYDAY AT BEDTIME (Patient taking differently: Take 10 mg by mouth at bedtime.) 90 tablet 0   olmesartan (BENICAR) 20 MG tablet Take 1 tablet (20 mg total) by mouth daily. 90 tablet 0   pantoprazole (PROTONIX) 40 MG tablet TAKE 1 TABLET BY MOUTH EVERY DAY 30 MINUTES BEFORE EVENING MEALS 90 tablet 1   Probiotic Product (ALIGN) 4 MG CAPS Take 4 mg by mouth in the morning.     rosuvastatin (CRESTOR) 10 MG tablet Take 1 tablet (10 mg total) by mouth at bedtime. 90 tablet 1   traMADol (ULTRAM) 50 MG tablet Take 1 tablet (50 mg total) by mouth at bedtime. 90 tablet 1   albuterol (VENTOLIN HFA) 108 (90 Base) MCG/ACT inhaler Inhale 2 puffs into the lungs every 6 (six) hours as needed for wheezing or shortness of breath. (Patient not taking: Reported on  11/23/2022) 8 g 2   No facility-administered medications prior to visit.    ROS Review of Systems  Objective:  BP 122/78 (BP Location: Left Arm, Patient Position: Sitting, Cuff Size: Normal)   Pulse 77   Temp 97.7 F (36.5 C) (Oral)   Ht '5\' 9"'$  (1.753 m)   Wt 177 lb 3.2 oz (80.4 kg)   SpO2 95%   BMI 26.17 kg/m   BP Readings from Last 3 Encounters:  11/23/22 122/78  10/13/22 132/78  10/08/22 132/76    Wt Readings from Last 3 Encounters:  11/23/22 177 lb 3.2 oz (80.4 kg)  10/13/22 174 lb 3.2 oz (79 kg)  10/08/22 176 lb (79.8 kg)    Physical Exam  Lab Results  Component Value Date   WBC 7.2 10/05/2022   HGB 15.4 10/05/2022   HCT 44.5 10/05/2022   PLT 277.0 10/05/2022   GLUCOSE 92 10/05/2022   CHOL 136 04/14/2022   TRIG 59 04/14/2022   HDL 66 04/14/2022   LDLDIRECT 151.6 04/10/2011   LDLCALC 58 04/14/2022   ALT 30 04/14/2022   AST 34 04/14/2022   NA 140 10/05/2022   K 4.5 10/05/2022   CL 102 10/05/2022   CREATININE 1.03 10/05/2022   BUN 19  10/05/2022   CO2 30 10/05/2022   TSH 1.81 10/05/2022   PSA 2.01 10/05/2022   INR 1.0 03/09/2017    VAS US RENAL ARTERY DUPLEX  Result Date: 10/15/2022 ABDOMINAL VISCERAL Patient Name:  DR. Joretta [name]  Date of Exam:   10/15/2022 Medical Rec #: [id number]         Accession #:    [id number] Date of Birth: 03-May-1946         Patient Gender: M Patient Age:   85 years Exam Location:  [facility] Procedure:      VAS US RENAL ARTERY DUPLEX Referring Phys: [place] -------------------------------------------------------------------------------- Indications: Hypertension, abdominal bruit High Risk Factors: Hypertension, hyperlipidemia, coronary artery disease. Limitations: Air/bowel gas. Comparison Study: No prior study Performing Technologist: Maudry Mayhew MHA, RDMS, RVT, RDCS  Examination Guidelines: A complete evaluation includes B-mode imaging, spectral Doppler, color Doppler, and power Doppler as  needed of all accessible portions of each vessel. Bilateral testing is considered an integral part of a complete examination. Limited examinations for reoccurring indications may be performed as noted.  Duplex Findings: +------------+--------+--------+------+-----------------------+ Mesenteric  PSV cm/sEDV cm/sPlaque       Comments         +------------+--------+--------+------+-----------------------+ Aorta Prox     85                 2.22cm AP, 1.79cm Trans +------------+--------+--------+------+-----------------------+ Aorta Mid      91                 1.75cm AP, 1.95cm Trans +------------+--------+--------+------+-----------------------+ Aorta Distal   80                 1.63cm AP, 1.50cm Trans +------------+--------+--------+------+-----------------------+  +---------+----------------+-------------------+-------------------+          Diameter AP (cm)Diameter Trans (cm)Velocities (cm/sec) +---------+----------------+-------------------+-------------------+ Right CIA1.13            1.00               123                 +---------+----------------+-------------------+-------------------+ Left CIA 1.22            1.05               148                 +---------+----------------+-------------------+-------------------+  +------------------+--------+--------+------------------+ Right Renal ArteryPSV cm/sEDV cm/s     Comment       +------------------+--------+--------+------------------+ Origin                            Unable to insonate +------------------+--------+--------+------------------+ Proximal            140      39                      +------------------+--------+--------+------------------+ Mid                  61      19                      +------------------+--------+--------+------------------+ Distal               58      19                      +------------------+--------+--------+------------------+  +-----------------+--------+--------+------------------+ Left Renal ArteryPSV cm/sEDV cm/s  Comment       +-----------------+--------+--------+------------------+ Origin                           Unable to insonate +-----------------+--------+--------+------------------+ Proximal            47      14                      +-----------------+--------+--------+------------------+ Mid                 71      19                      +-----------------+--------+--------+------------------+ Distal              51      16                      +-----------------+--------+--------+------------------+ +------------+--------+--------+----+-----------+--------+--------+----+ Right KidneyPSV cm/sEDV cm/sRI  Left KidneyPSV cm/sEDV cm/sRI   +------------+--------+--------+----+-----------+--------+--------+----+ Upper Pole  53      20      0.62Upper Pole 27      9       0.67 +------------+--------+--------+----+-----------+--------+--------+----+ Mid         33      12      0.64Mid        36      10      0.72 +------------+--------+--------+----+-----------+--------+--------+----+ Lower Pole  36      13      0.64Lower Pole 31      9       0.71 +------------+--------+--------+----+-----------+--------+--------+----+ Hilar       40      14      0.65Hilar      44      13      0.70 +------------+--------+--------+----+-----------+--------+--------+----+ +------------------+---------+------------------+---------+ Right Kidney               Left Kidney                 +------------------+---------+------------------+---------+ RAR                        RAR                         +------------------+---------+------------------+---------+ RAR (manual)      1.65     RAR (manual)      0.84      +------------------+---------+------------------+---------+ Cortex            24/8 cm/sCortex            19/7 cm/s  +------------------+---------+------------------+---------+ Cortex thickness           Corex thickness             +------------------+---------+------------------+---------+ Kidney length (cm)10.70    Kidney length (cm)9.64      +------------------+---------+------------------+---------+  Summary: Largest Aortic Diameter: 2.2 cm. No evidence of abdominal aortic aneurysm.  Renal:  Right: No evidence of right renal artery stenosis. RRV flow present. Left:  No evidence of left renal artery stenosis. LRV flow present.  *See table(s) above for measurements and observations.  Diagnosing physician: Servando Snare MD  Electronically signed by Servando Snare MD on 10/15/2022 at 2:01:25 PM.    Final     Assessment & Plan:   There are no diagnoses linked to this encounter. I am having  Dr. Melina Copa. [name], MD maintain his alfuzosin, montelukast, celecoxib, fluticasone, aspirin EC, Align, amLODipine, albuterol, benzonatate, olmesartan, indapamide, rosuvastatin, dextromethorphan, traMADol, and pantoprazole.  No orders of the defined types were placed in this encounter.    Follow-up: No follow-ups on file.  Scarlette Calico, MD

## 2022-11-26 DIAGNOSIS — M542 Cervicalgia: Secondary | ICD-10-CM | POA: Diagnosis not present

## 2022-11-26 DIAGNOSIS — M6281 Muscle weakness (generalized): Secondary | ICD-10-CM | POA: Diagnosis not present

## 2022-11-26 DIAGNOSIS — R269 Unspecified abnormalities of gait and mobility: Secondary | ICD-10-CM | POA: Diagnosis not present

## 2022-11-26 DIAGNOSIS — M47812 Spondylosis without myelopathy or radiculopathy, cervical region: Secondary | ICD-10-CM | POA: Diagnosis not present

## 2022-11-29 ENCOUNTER — Other Ambulatory Visit: Payer: Self-pay | Admitting: Internal Medicine

## 2022-11-29 DIAGNOSIS — I10 Essential (primary) hypertension: Secondary | ICD-10-CM

## 2022-12-01 DIAGNOSIS — M542 Cervicalgia: Secondary | ICD-10-CM | POA: Diagnosis not present

## 2022-12-01 DIAGNOSIS — M47812 Spondylosis without myelopathy or radiculopathy, cervical region: Secondary | ICD-10-CM | POA: Diagnosis not present

## 2022-12-01 DIAGNOSIS — R269 Unspecified abnormalities of gait and mobility: Secondary | ICD-10-CM | POA: Diagnosis not present

## 2022-12-01 DIAGNOSIS — M6281 Muscle weakness (generalized): Secondary | ICD-10-CM | POA: Diagnosis not present

## 2022-12-03 DIAGNOSIS — K2289 Other specified disease of esophagus: Secondary | ICD-10-CM | POA: Diagnosis not present

## 2022-12-03 DIAGNOSIS — K2270 Barrett's esophagus without dysplasia: Secondary | ICD-10-CM | POA: Diagnosis not present

## 2022-12-08 DIAGNOSIS — M47812 Spondylosis without myelopathy or radiculopathy, cervical region: Secondary | ICD-10-CM | POA: Diagnosis not present

## 2022-12-08 DIAGNOSIS — R269 Unspecified abnormalities of gait and mobility: Secondary | ICD-10-CM | POA: Diagnosis not present

## 2022-12-08 DIAGNOSIS — M6281 Muscle weakness (generalized): Secondary | ICD-10-CM | POA: Diagnosis not present

## 2022-12-08 DIAGNOSIS — M542 Cervicalgia: Secondary | ICD-10-CM | POA: Diagnosis not present

## 2022-12-09 DIAGNOSIS — G959 Disease of spinal cord, unspecified: Secondary | ICD-10-CM | POA: Diagnosis not present

## 2022-12-09 DIAGNOSIS — M5412 Radiculopathy, cervical region: Secondary | ICD-10-CM | POA: Diagnosis not present

## 2022-12-11 ENCOUNTER — Other Ambulatory Visit: Payer: Self-pay | Admitting: Neurological Surgery

## 2022-12-11 DIAGNOSIS — M5412 Radiculopathy, cervical region: Secondary | ICD-10-CM

## 2022-12-13 ENCOUNTER — Ambulatory Visit
Admission: RE | Admit: 2022-12-13 | Discharge: 2022-12-13 | Disposition: A | Discharge status: Home / Self Care | Payer: Medicare Other | Source: Ambulatory Visit | Attending: Neurological Surgery | Admitting: Neurological Surgery

## 2022-12-13 DIAGNOSIS — R202 Paresthesia of skin: Secondary | ICD-10-CM | POA: Diagnosis not present

## 2022-12-13 DIAGNOSIS — M5412 Radiculopathy, cervical region: Secondary | ICD-10-CM | POA: Diagnosis not present

## 2022-12-15 DIAGNOSIS — M47812 Spondylosis without myelopathy or radiculopathy, cervical region: Secondary | ICD-10-CM | POA: Diagnosis not present

## 2022-12-15 DIAGNOSIS — M542 Cervicalgia: Secondary | ICD-10-CM | POA: Diagnosis not present

## 2022-12-15 DIAGNOSIS — M6281 Muscle weakness (generalized): Secondary | ICD-10-CM | POA: Diagnosis not present

## 2022-12-15 DIAGNOSIS — R269 Unspecified abnormalities of gait and mobility: Secondary | ICD-10-CM | POA: Diagnosis not present

## 2022-12-21 NOTE — Progress Notes (Unsigned)
  Electrophysiology Office Note:    Date:  12/21/2022   ID:  Peter Bachelor, Peter [name], DOB 1946/08/29, MRN [id number]  [facility] Cardiologist:  [name], Peter [name]  [place] Electrophysiologist:  Vickie Epley, Peter [name]   Referring Peter [name]: Janith Lima, Peter [name]   Chief Complaint: SVT  History of Present Illness:    Peter Bachelor, Peter [name] is a 77 y.o. male but seen today for an evaluation of SVT at the request of Dr. [name].  The patient was previously seen by Dr. [name].  He has an extensive past medical history including hypertension and SVT.  He wore a heart monitor in January 2024 that showed brief nonsustained supraventricular tachycardias, longest lasting 12 beats.  He was previously seen in August 2016 by [name] for symptomatic PVCs.  He had a fairly low burden and was prescribed diltiazem as needed.      Their past medical, social and family history was reveiwed.   ROS:   Please see the history of present illness.    All other systems reviewed and are negative.  EKGs/Labs/Other Studies Reviewed:    The following studies were reviewed today:  November 02, 2022 ZIO monitor personally reviewed Nonsustained supraventricular tachycardia, longest episode lasting 12 beats.  Review of telemetry strip suggests atrial tachycardia  EKG on October 13, 2022 personally reviewed shows sinus rhythm.  No preexcitation.   Physical Exam:    VS:  There were no vitals taken for this visit.    Wt Readings from Last 3 Encounters:  11/23/22 177 lb 3.2 oz (80.4 kg)  10/13/22 174 lb 3.2 oz (79 kg)  10/08/22 176 lb (79.8 kg)     GEN: *** Well nourished, well developed in no acute distress CARDIAC: ***RRR, no murmurs, rubs, gallops RESPIRATORY:  Clear to auscultation without rales, wheezing or rhonchi       ASSESSMENT AND PLAN:    No diagnosis found.  #Palpitations #SVT Nonsustained salvos of what looks to be an atrial tachycardia. Discussed treatment options including continuing  conservative/watchful waiting approach versus starting a beta-blocker.  I would not recommend antiarrhythmic drug therapy given the low burden of these arrhythmias.  #Hypertension *** goal today.  Recommend checking blood pressures 1-2 times per week at home and recording the values.  Recommend bringing these recordings to the primary care physician.        Signed, Hilton Cork. Quentin Ore, Peter [name], [facility], [facility] 12/21/2022 9:42 PM    Electrophysiology [facility]

## 2022-12-22 ENCOUNTER — Ambulatory Visit: Payer: Medicare Other | Attending: Cardiology | Admitting: Cardiology

## 2022-12-22 ENCOUNTER — Other Ambulatory Visit: Payer: Self-pay | Admitting: Neurological Surgery

## 2022-12-22 ENCOUNTER — Encounter: Payer: Self-pay | Admitting: Cardiology

## 2022-12-22 VITALS — BP 146/72 | HR 75 | Ht 69.0 in | Wt 177.0 lb

## 2022-12-22 DIAGNOSIS — M5010 Cervical disc disorder with radiculopathy, unspecified cervical region: Secondary | ICD-10-CM

## 2022-12-22 DIAGNOSIS — I10 Essential (primary) hypertension: Secondary | ICD-10-CM | POA: Insufficient documentation

## 2022-12-22 DIAGNOSIS — R002 Palpitations: Secondary | ICD-10-CM | POA: Diagnosis not present

## 2022-12-22 DIAGNOSIS — M25511 Pain in right shoulder: Secondary | ICD-10-CM | POA: Diagnosis not present

## 2022-12-22 NOTE — Progress Notes (Signed)
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 Ronnald [name].  The patient was previously seen by Dr. Caryl [name].  He has an extensive past medical history including hypertension and SVT.  He wore a heart monitor in 11/19/22 that showed brief nonsustained supraventricular tachycardias, longest lasting 12 beats.  He was previously seen in August 2016 by Peter [name] for symptomatic PVCs.  He had a fairly low burden and was prescribed diltiazem as needed.  Today, he is accompanied by his wife. He believes his recent arrhythmic episodes were exacerbated by stress. In December, his mother-in-law passed away. Then in 11-19-2022 his brother passed away from esophageal cancer. At home he is monitoring his heart rhythm with a [employer].  For activity, he has been participating in a pilates class. At the end of 2022/11/19, he reports having a transient amnesiac episode in the setting of right after a heavy workout. At the time he was writing a check, and his wife notes that he suddenly became very quiet. He answered his wife that he was okay, but on the way home he mentioned that he forgot to write the check.  In clinic today his blood pressure is 146/72. He states he has known white coat hypertension. At home he frequently has more controlled readings such as 120s/60s.  Typically he may have 1-2 cups of coffee a day. He has switched to half-decaf, half-caffeinated.  He also endorses being diagnosed with spinal CPPD.   He denies any chest pain, shortness of breath, or peripheral edema. No lightheadedness, headaches, syncope, orthopnea, or PND.     Their  past medical, social and family history was reveiwed.   ROS:   Please see the history of present illness.    (+) Palpitations (+) Stress (+) Isolated amnesiac episode All other systems reviewed and are negative.  EKGs/Labs/Other Studies Reviewed:    The following studies were reviewed today:  November 02, 2022 ZIO monitor personally reviewed Nonsustained supraventricular tachycardia, longest episode lasting 12 beats.  Review of telemetry strip suggests atrial tachycardia  EKG on October 13, 2022 personally reviewed shows sinus rhythm.  No preexcitation.   Physical Exam:    VS:  BP (!) 146/72   Pulse 75   Ht '5\' 9"'$  (1.753 m)   Wt 177 lb (80.3 kg)   SpO2 94%   BMI 26.14 kg/m     Wt Readings from Last 3 Encounters:  12/22/22 177 lb (80.3 kg)  11/23/22 177 lb 3.2 oz (80.4 kg)  10/13/22 174 lb 3.2 oz (79 kg)     GEN:  Well nourished, well developed in no acute distress CARDIAC: RRR, no murmurs, rubs, gallops RESPIRATORY:  Clear to auscultation without rales, wheezing or rhonchi       ASSESSMENT AND PLAN:    1. Palpitations   2. Primary hypertension     #Palpitations #SVT Nonsustained salvos of what looks to be an atrial tachycardia. Discussed treatment options including continuing conservative/watchful waiting approach versus starting a beta-blocker.  I would not recommend antiarrhythmic drug therapy given the low burden of these arrhythmias.  #Hypertension  Slightly above goal today in clinic but is controlled at home when he checks it..  Recommend checking blood pressures 1-2 times per week at home and recording the values.  Recommend bringing these recordings to the primary care physician.   Follow-up as needed   I,Peter [name],acting as a scribe for Peter Epley, MD.,have documented all relevant documentation on the behalf of Peter Epley, MD,as directed by  Peter Epley, MD while in the presence of Peter Epley, MD.  I, Peter Epley, MD, have reviewed all documentation for this visit. The documentation on 12/22/22 for the exam, diagnosis, procedures, and orders are all accurate and complete.   Signed, Peter [name]. Peter Ore, MD, [facility], [facility] 12/22/2022 10:32 AM    Electrophysiology [facility]

## 2022-12-22 NOTE — Patient Instructions (Signed)
Medication Instructions:  Your physician recommends that you continue on your current medications as directed. Please refer to the Current Medication list given to you today.  *If you need a refill on your cardiac medications before your next appointment, please call your pharmacy*  Follow-Up: At [facility], you and your health needs are our priority.  As part of our continuing mission to provide you with exceptional heart care, we have created designated Provider Care Teams.  These Care Teams include your primary Cardiologist (physician) and Advanced Practice Providers (APPs -  Physician Assistants and Nurse Practitioners) who all work together to provide you with the care you need, when you need it.  Your next appointment:   As needed with Dr. Quentin Ore

## 2022-12-28 DIAGNOSIS — S46211A Strain of muscle, fascia and tendon of other parts of biceps, right arm, initial encounter: Secondary | ICD-10-CM | POA: Diagnosis not present

## 2023-01-03 DIAGNOSIS — Z23 Encounter for immunization: Secondary | ICD-10-CM | POA: Diagnosis not present

## 2023-01-04 ENCOUNTER — Encounter: Payer: Self-pay | Admitting: Internal Medicine

## 2023-01-04 ENCOUNTER — Ambulatory Visit (INDEPENDENT_AMBULATORY_CARE_PROVIDER_SITE_OTHER): Payer: Medicare Other | Admitting: Internal Medicine

## 2023-01-04 VITALS — BP 134/72 | HR 87 | Temp 97.9°F | Ht 69.0 in | Wt 175.0 lb

## 2023-01-04 DIAGNOSIS — I471 Supraventricular tachycardia, unspecified: Secondary | ICD-10-CM

## 2023-01-04 DIAGNOSIS — I10 Essential (primary) hypertension: Secondary | ICD-10-CM | POA: Diagnosis not present

## 2023-01-04 DIAGNOSIS — F411 Generalized anxiety disorder: Secondary | ICD-10-CM | POA: Diagnosis not present

## 2023-01-04 MED ORDER — ALPRAZOLAM 0.5 MG PO TABS: 0.5000 mg | ORAL_TABLET | Freq: Two times a day (BID) | ORAL | 0 refills | Status: AC | PRN

## 2023-01-04 NOTE — Progress Notes (Signed)
Subjective:  Patient ID: Peter Bachelor, MD, male    DOB: [birth year]  Age: 77 y.o. MRN: [id number]  CC: Hypertension   HPI Peter Bachelor, MD presents for f/up ---  He complains of anxiety and insomnia.  He has been taking his wife's supply of Xanax and wants his own prescription.  Outpatient Medications Prior to Visit  Medication Sig Dispense Refill   albuterol (VENTOLIN HFA) 108 (90 Base) MCG/ACT inhaler Inhale 2 puffs into the lungs every 6 (six) hours as needed for wheezing or shortness of breath. 8 g 2   alfuzosin (UROXATRAL) 10 MG 24 hr tablet Take 10 mg by mouth every evening.      amLODipine (NORVASC) 5 MG tablet Take 5 mg by mouth daily.     aspirin EC 81 MG tablet Take 81 mg by mouth at bedtime. Swallow whole.     benzonatate (TESSALON) 100 MG capsule Take 1 capsule (100 mg total) by mouth 2 (two) times daily as needed for cough. 20 capsule 0   celecoxib (CELEBREX) 100 MG capsule Take 1 capsule (100 mg total) by mouth 2 (two) times daily as needed. (Patient taking differently: Take 100 mg by mouth in the morning.) 60 capsule 3   dextromethorphan (DELSYM) 30 MG/5ML liquid Take 5 mLs (30 mg total) by mouth 2 (two) times daily. 148 mL 0   fluticasone (CUTIVATE) 0.05 % cream Apply 1 application topically 2 (two) times daily.     indapamide (LOZOL) 1.25 MG tablet TAKE 1 TABLET BY MOUTH DAILY. 90 tablet 0   montelukast (SINGULAIR) 10 MG tablet TAKE 1 TABLET BY MOUTH EVERYDAY AT BEDTIME (Patient taking differently: Take 10 mg by mouth at bedtime.) 90 tablet 0   olmesartan (BENICAR) 20 MG tablet TAKE 1 TABLET BY MOUTH EVERY DAY 90 tablet 0   pantoprazole (PROTONIX) 40 MG tablet Take 1 tablet (40 mg total) by mouth 2 (two) times daily before a meal. TAKE 1 TABLET BY MOUTH EVERY DAY 30 MINUTES BEFORE EVENING MEALS 180 tablet 0   Probiotic Product (ALIGN) 4 MG CAPS Take 4 mg by mouth in the morning.     rosuvastatin (CRESTOR) 10 MG tablet Take 1 tablet (10 mg total) by mouth at bedtime. 90  tablet 1   traMADol (ULTRAM) 50 MG tablet Take 1 tablet (50 mg total) by mouth at bedtime. 90 tablet 1   No facility-administered medications prior to visit.    ROS Review of Systems  Constitutional: Negative.  Negative for diaphoresis and fatigue.  HENT: Negative.    Eyes: Negative.   Respiratory:  Negative for cough, chest tightness, shortness of breath and wheezing.   Cardiovascular: Negative.  Negative for chest pain, palpitations and leg swelling.  Gastrointestinal:  Negative for abdominal pain and nausea.  Endocrine: Negative.   Genitourinary: Negative.  Negative for difficulty urinating.  Musculoskeletal:  Positive for arthralgias, back pain and neck pain.  Neurological: Negative.  Negative for dizziness and weakness.  Hematological:  Negative for adenopathy. Does not bruise/bleed easily.  Psychiatric/Behavioral:  Positive for sleep disturbance. Negative for confusion, decreased concentration, dysphoric mood and suicidal ideas. The patient is nervous/anxious.     Objective:  BP 134/72 (BP Location: Left Arm, Patient Position: Sitting, Cuff Size: Large)   Pulse 87   Temp 97.9 F (36.6 C) (Oral)   Ht '5\' 9"'$  (1.753 m)   Wt 175 lb (79.4 kg)   SpO2 96%   BMI 25.84 kg/m   BP Readings from Last 3 Encounters:  01/04/23 134/72  12/22/22 (!) 146/72  11/23/22 122/78    Wt Readings from Last 3 Encounters:  01/04/23 175 lb (79.4 kg)  12/22/22 177 lb (80.3 kg)  11/23/22 177 lb 3.2 oz (80.4 kg)    Physical Exam Vitals reviewed.  HENT:     Nose: Nose normal.     Mouth/Throat:     Mouth: Mucous membranes are moist.  Eyes:     General: No scleral icterus. Cardiovascular:     Rate and Rhythm: Normal rate and regular rhythm.     Heart sounds: No murmur heard. Pulmonary:     Effort: Pulmonary effort is normal.     Breath sounds: No stridor. No wheezing, rhonchi or rales.  Abdominal:     General: Abdomen is flat.     Palpations: There is no mass.     Tenderness: There is  no abdominal tenderness. There is no guarding.     Hernia: No hernia is present.  Musculoskeletal:        General: Normal range of motion.     Cervical back: Neck supple.     Right lower leg: No edema.     Left lower leg: No edema.  Lymphadenopathy:     Cervical: No cervical adenopathy.  Skin:    General: Skin is warm and dry.  Neurological:     General: No focal deficit present.     Mental Status: He is alert. Mental status is at baseline.  Psychiatric:        Attention and Perception: He is inattentive.        Mood and Affect: Mood is anxious.        Speech: He is communicative. Speech is not rapid and pressured.        Behavior: Behavior normal.        Thought Content: Thought content normal. Thought content is not paranoid or delusional. Thought content does not include homicidal or suicidal ideation.        Cognition and Memory: Cognition normal.     Lab Results  Component Value Date   WBC 7.2 10/05/2022   HGB 15.4 10/05/2022   HCT 44.5 10/05/2022   PLT 277.0 10/05/2022   GLUCOSE 92 10/05/2022   CHOL 136 04/14/2022   TRIG 59 04/14/2022   HDL 66 04/14/2022   LDLDIRECT 151.6 04/10/2011   LDLCALC 58 04/14/2022   ALT 30 04/14/2022   AST 34 04/14/2022   NA 140 10/05/2022   K 4.5 10/05/2022   CL 102 10/05/2022   CREATININE 1.03 10/05/2022   BUN 19 10/05/2022   CO2 30 10/05/2022   TSH 1.81 10/05/2022   PSA 2.01 10/05/2022   INR 1.0 03/09/2017    MR CERVICAL SPINE WO CONTRAST  Result Date: 12/14/2022 CLINICAL DATA:  Cervical myelopathy with cervical radiculopathy, 3-4 weeks duration. Intermittent bilateral ulnar neuropathy. Intermittent tingling on the left, Peri auricular. EXAM: MRI CERVICAL SPINE WITHOUT CONTRAST TECHNIQUE: Multiplanar, multisequence MR imaging of the cervical spine was performed. No intravenous contrast was administered. COMPARISON:  Radiography 06/22/2022.  MRI 05/21/2022. FINDINGS: Alignment: Postsurgical straightening.  No antero or  retrolisthesis. Vertebrae: Since the study of last July, there has been interval anterior cervical discectomy and fusion at C4-5. There is now fusion from C4 through C7 which has a good appearance. Cystic change at the base of the dens is not progressive in can be seen with ordinary osteoarthritis or CPPD. Cord: No cord compression or focal cord lesion. Posterior Fossa, vertebral arteries, paraspinal tissues:  Normal Disc levels: Foramen magnum is widely patent. At C1-2, there is arthropathy with cystic change which, as noted above, could be degenerative or subsequent to CPPD. No encroachment upon the neural structures. C2-3: Right-sided predominant spondylosis with uncovertebral osteophytes and bulging of the disc. 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 Foraminal stenosis on the right could compress the right C3 nerve. Similar appearance to the study of July 2023. C3-4: Spondylosis with uncovertebral prominence and mild bulging of the disc. Facet osteoarthritis worse on the left than the right, including the presence of a small effusion and edematous change. This could certainly be a cause of left neck pain. There is bilateral foraminal narrowing, left more than right. The left C4 nerve could be affected. Similar appearance to the study of July 2023. C4 through C7: Good appearance following prior fusion procedures. Sufficient patency of the canal and foramina. C7-T1: Normal interspace. IMPRESSION: 1. Interval anterior cervical discectomy and fusion at C4-5. Good appearance in that segment. 2. C2-3: Right-sided predominant spondylosis and facet arthropathy. Foraminal stenosis on the right could compress the right C3 nerve. Similar appearance to the study of July 2023. 3. C3-4: Spondylosis. 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 This could certainly be a cause of left neck pain and potentially peri auricular tingling.  Foraminal narrowing on the left could compress the left C4 nerve. Similar appearance to the study of July 2023, other than slight worsening of the facet arthropathy. Electronically Signed   By: Nelson Chimes [name]   On: 12/14/2022 14:27    Assessment & Plan:   Serafin was seen today for hypertension.  Diagnoses and all orders for this visit:  Primary hypertension- His blood pressure is adequately well-controlled.  GAD (generalized anxiety disorder) -     ALPRAZolam (XANAX) 0.5 MG tablet; Take 1 tablet (0.5 mg total) by mouth 2 (two) times daily as needed for anxiety.  Paroxysmal SVT (supraventricular tachycardia)- He has good rate and rhythm control.   I am having Dr. Eddie Dibbles D. [name], MD start on ALPRAZolam. I am also having him maintain his alfuzosin, montelukast, celecoxib, fluticasone, aspirin EC, Align, albuterol, benzonatate, rosuvastatin, dextromethorphan, traMADol, pantoprazole, olmesartan, indapamide, and amLODipine.  Meds ordered this encounter  Medications   ALPRAZolam (XANAX) 0.5 MG tablet    Sig: Take 1 tablet (0.5 mg total) by mouth 2 (two) times daily as needed for anxiety.    Dispense:  180 tablet    Refill:  0     Follow-up: No follow-ups on file.  Scarlette Calico, MD

## 2023-01-06 ENCOUNTER — Other Ambulatory Visit: Payer: Medicare Other

## 2023-01-07 DIAGNOSIS — J392 Other diseases of pharynx: Secondary | ICD-10-CM | POA: Diagnosis not present

## 2023-01-12 DIAGNOSIS — M6281 Muscle weakness (generalized): Secondary | ICD-10-CM | POA: Diagnosis not present

## 2023-01-12 DIAGNOSIS — M542 Cervicalgia: Secondary | ICD-10-CM | POA: Diagnosis not present

## 2023-01-12 DIAGNOSIS — R269 Unspecified abnormalities of gait and mobility: Secondary | ICD-10-CM | POA: Diagnosis not present

## 2023-01-12 DIAGNOSIS — M47812 Spondylosis without myelopathy or radiculopathy, cervical region: Secondary | ICD-10-CM | POA: Diagnosis not present

## 2023-01-19 DIAGNOSIS — L821 Other seborrheic keratosis: Secondary | ICD-10-CM | POA: Diagnosis not present

## 2023-01-19 DIAGNOSIS — D485 Neoplasm of uncertain behavior of skin: Secondary | ICD-10-CM | POA: Diagnosis not present

## 2023-01-19 DIAGNOSIS — L905 Scar conditions and fibrosis of skin: Secondary | ICD-10-CM | POA: Diagnosis not present

## 2023-01-19 DIAGNOSIS — Z85828 Personal history of other malignant neoplasm of skin: Secondary | ICD-10-CM | POA: Diagnosis not present

## 2023-01-19 DIAGNOSIS — L111 Transient acantholytic dermatosis [Grover]: Secondary | ICD-10-CM | POA: Diagnosis not present

## 2023-01-19 DIAGNOSIS — C4372 Malignant melanoma of left lower limb, including hip: Secondary | ICD-10-CM | POA: Diagnosis not present

## 2023-01-19 DIAGNOSIS — L570 Actinic keratosis: Secondary | ICD-10-CM | POA: Diagnosis not present

## 2023-01-19 DIAGNOSIS — Z85820 Personal history of malignant melanoma of skin: Secondary | ICD-10-CM | POA: Diagnosis not present

## 2023-01-20 ENCOUNTER — Encounter: Payer: Self-pay | Admitting: Internal Medicine

## 2023-02-12 DIAGNOSIS — M5412 Radiculopathy, cervical region: Secondary | ICD-10-CM | POA: Diagnosis not present

## 2023-02-12 DIAGNOSIS — G959 Disease of spinal cord, unspecified: Secondary | ICD-10-CM | POA: Diagnosis not present

## 2023-02-12 DIAGNOSIS — Z6825 Body mass index (BMI) 25.0-25.9, adult: Secondary | ICD-10-CM | POA: Diagnosis not present

## 2023-02-15 ENCOUNTER — Other Ambulatory Visit: Payer: Self-pay | Admitting: Internal Medicine

## 2023-02-17 ENCOUNTER — Encounter: Payer: Self-pay | Admitting: Internal Medicine

## 2023-02-23 ENCOUNTER — Inpatient Hospital Stay: Admission: RE | Admit: 2023-02-23 | Payer: Medicare Other | Source: Ambulatory Visit

## 2023-02-23 ENCOUNTER — Ambulatory Visit
Admission: RE | Admit: 2023-02-23 | Discharge: 2023-02-23 | Disposition: A | Discharge status: Home / Self Care | Payer: Medicare Other | Source: Ambulatory Visit | Attending: Neurological Surgery | Admitting: Neurological Surgery

## 2023-02-23 DIAGNOSIS — M5010 Cervical disc disorder with radiculopathy, unspecified cervical region: Secondary | ICD-10-CM | POA: Diagnosis not present

## 2023-02-23 DIAGNOSIS — R202 Paresthesia of skin: Secondary | ICD-10-CM | POA: Diagnosis not present

## 2023-02-23 DIAGNOSIS — M4692 Unspecified inflammatory spondylopathy, cervical region: Secondary | ICD-10-CM | POA: Diagnosis not present

## 2023-02-23 DIAGNOSIS — M542 Cervicalgia: Secondary | ICD-10-CM | POA: Diagnosis not present

## 2023-02-23 MED ORDER — DEXAMETHASONE SODIUM PHOSPHATE 4 MG/ML IJ SOLN
7.0000 mg | Freq: Once | INTRAMUSCULAR | Status: AC
  Administered 2023-02-23: 7 mg via INTRA_ARTICULAR

## 2023-02-23 MED ORDER — IOPAMIDOL (ISOVUE-M 300) INJECTION 61%
1.0000 mL | Freq: Once | INTRAMUSCULAR | Status: AC
  Administered 2023-02-23: 1 mL via INTRA_ARTICULAR

## 2023-02-23 NOTE — Discharge Instructions (Signed)
Post Procedure Spinal Discharge Instruction Sheet  You may resume a regular diet and any medications that you routinely take (including pain medications) unless otherwise noted by MD.  No driving day of procedure.  Light activity throughout the rest of the day.  Do not do any strenuous work, exercise, bending or lifting.  The day following the procedure, you can resume normal physical activity but you should refrain from exercising or physical therapy for at least three days thereafter.  You may apply ice to the injection site, 20 minutes on, 20 minutes off, as needed. Do not apply ice directly to skin.    Common Side Effects:  Headaches- take your usual medications as directed by your physician.  Increase your fluid intake.  Caffeinated beverages may be helpful.  Lie flat in bed until your headache resolves.  Restlessness or inability to sleep- you may have trouble sleeping for the next few days.  Ask your referring physician if you need any medication for sleep.  Facial flushing or redness- should subside within a few days.  Increased pain- a temporary increase in pain a day or two following your procedure is not unusual.  Take your pain medication as prescribed by your referring physician.  Leg cramps  Please contact our office at [phone] for the following symptoms: Fever greater than 100 degrees. Headaches unresolved with medication after 2-3 days. Increased swelling, pain, or redness at injection site.   You may resume Aspirin today.   Thank you for visiting [facility] today.

## 2023-02-25 DIAGNOSIS — D0372 Melanoma in situ of left lower limb, including hip: Secondary | ICD-10-CM | POA: Diagnosis not present

## 2023-02-25 DIAGNOSIS — Z85828 Personal history of other malignant neoplasm of skin: Secondary | ICD-10-CM | POA: Diagnosis not present

## 2023-02-25 DIAGNOSIS — Z85820 Personal history of malignant melanoma of skin: Secondary | ICD-10-CM | POA: Diagnosis not present

## 2023-02-25 DIAGNOSIS — C4372 Malignant melanoma of left lower limb, including hip: Secondary | ICD-10-CM | POA: Diagnosis not present

## 2023-02-26 ENCOUNTER — Telehealth: Payer: Self-pay | Admitting: Radiology

## 2023-02-26 DIAGNOSIS — H6123 Impacted cerumen, bilateral: Secondary | ICD-10-CM | POA: Diagnosis not present

## 2023-02-26 DIAGNOSIS — H9313 Tinnitus, bilateral: Secondary | ICD-10-CM | POA: Diagnosis not present

## 2023-02-26 DIAGNOSIS — M1120 Other chondrocalcinosis, unspecified site: Secondary | ICD-10-CM | POA: Diagnosis not present

## 2023-02-26 DIAGNOSIS — K2270 Barrett's esophagus without dysplasia: Secondary | ICD-10-CM | POA: Diagnosis not present

## 2023-02-26 DIAGNOSIS — Z0110 Encounter for examination of ears and hearing without abnormal findings: Secondary | ICD-10-CM | POA: Diagnosis not present

## 2023-02-26 DIAGNOSIS — R2689 Other abnormalities of gait and mobility: Secondary | ICD-10-CM | POA: Diagnosis not present

## 2023-02-26 DIAGNOSIS — H903 Sensorineural hearing loss, bilateral: Secondary | ICD-10-CM | POA: Diagnosis not present

## 2023-02-26 NOTE — Telephone Encounter (Signed)
Contacted [name], MD to schedule their annual wellness visit. Patient declined to schedule AWV at this time.  [name] K. CMA

## 2023-03-01 ENCOUNTER — Other Ambulatory Visit: Payer: Self-pay | Admitting: Internal Medicine

## 2023-03-01 DIAGNOSIS — I10 Essential (primary) hypertension: Secondary | ICD-10-CM

## 2023-03-01 DIAGNOSIS — K219 Gastro-esophageal reflux disease without esophagitis: Secondary | ICD-10-CM

## 2023-03-21 ENCOUNTER — Other Ambulatory Visit: Payer: Self-pay | Admitting: Internal Medicine

## 2023-03-21 DIAGNOSIS — E785 Hyperlipidemia, unspecified: Secondary | ICD-10-CM

## 2023-03-21 DIAGNOSIS — K219 Gastro-esophageal reflux disease without esophagitis: Secondary | ICD-10-CM

## 2023-03-23 DIAGNOSIS — M6281 Muscle weakness (generalized): Secondary | ICD-10-CM | POA: Diagnosis not present

## 2023-03-23 DIAGNOSIS — R269 Unspecified abnormalities of gait and mobility: Secondary | ICD-10-CM | POA: Diagnosis not present

## 2023-03-23 DIAGNOSIS — M47812 Spondylosis without myelopathy or radiculopathy, cervical region: Secondary | ICD-10-CM | POA: Diagnosis not present

## 2023-03-23 DIAGNOSIS — M542 Cervicalgia: Secondary | ICD-10-CM | POA: Diagnosis not present

## 2023-03-24 ENCOUNTER — Other Ambulatory Visit: Payer: Self-pay | Admitting: Internal Medicine

## 2023-03-24 MED ORDER — FLUTICASONE PROPIONATE 0.05 % EX CREA: 1.0000 | TOPICAL_CREAM | Freq: Two times a day (BID) | CUTANEOUS | 0 refills | Status: DC

## 2023-03-30 DIAGNOSIS — M47812 Spondylosis without myelopathy or radiculopathy, cervical region: Secondary | ICD-10-CM | POA: Diagnosis not present

## 2023-03-30 DIAGNOSIS — M542 Cervicalgia: Secondary | ICD-10-CM | POA: Diagnosis not present

## 2023-03-30 DIAGNOSIS — M6281 Muscle weakness (generalized): Secondary | ICD-10-CM | POA: Diagnosis not present

## 2023-03-30 DIAGNOSIS — R269 Unspecified abnormalities of gait and mobility: Secondary | ICD-10-CM | POA: Diagnosis not present

## 2023-03-31 ENCOUNTER — Other Ambulatory Visit: Payer: Self-pay | Admitting: Neurological Surgery

## 2023-03-31 DIAGNOSIS — M5010 Cervical disc disorder with radiculopathy, unspecified cervical region: Secondary | ICD-10-CM

## 2023-04-12 ENCOUNTER — Encounter: Payer: Self-pay | Admitting: Internal Medicine

## 2023-04-13 ENCOUNTER — Other Ambulatory Visit: Payer: Self-pay | Admitting: Internal Medicine

## 2023-04-13 DIAGNOSIS — U071 COVID-19: Secondary | ICD-10-CM | POA: Insufficient documentation

## 2023-04-13 MED ORDER — NIRMATRELVIR/RITONAVIR (PAXLOVID)TABLET: 3.0000 | ORAL_TABLET | Freq: Two times a day (BID) | ORAL | 0 refills | Status: AC

## 2023-04-20 ENCOUNTER — Ambulatory Visit
Admission: RE | Admit: 2023-04-20 | Discharge: 2023-04-20 | Disposition: A | Discharge status: Home / Self Care | Payer: Medicare Other | Source: Ambulatory Visit | Attending: Neurological Surgery | Admitting: Neurological Surgery

## 2023-04-20 ENCOUNTER — Other Ambulatory Visit: Payer: Medicare Other

## 2023-04-20 DIAGNOSIS — M5010 Cervical disc disorder with radiculopathy, unspecified cervical region: Secondary | ICD-10-CM

## 2023-04-20 DIAGNOSIS — M4722 Other spondylosis with radiculopathy, cervical region: Secondary | ICD-10-CM | POA: Diagnosis not present

## 2023-04-20 MED ORDER — DEXAMETHASONE SODIUM PHOSPHATE 4 MG/ML IJ SOLN
5.0000 mg | Freq: Once | INTRAMUSCULAR | Status: AC
  Administered 2023-04-20: 5.2 mg via INTRA_ARTICULAR

## 2023-04-20 MED ORDER — IOPAMIDOL (ISOVUE-M 300) INJECTION 61%
1.0000 mL | Freq: Once | INTRAMUSCULAR | Status: AC
  Administered 2023-04-20: 1 mL via INTRA_ARTICULAR

## 2023-04-20 NOTE — Discharge Instructions (Signed)

## 2023-04-22 DIAGNOSIS — M47812 Spondylosis without myelopathy or radiculopathy, cervical region: Secondary | ICD-10-CM | POA: Diagnosis not present

## 2023-04-22 DIAGNOSIS — M6281 Muscle weakness (generalized): Secondary | ICD-10-CM | POA: Diagnosis not present

## 2023-04-22 DIAGNOSIS — R269 Unspecified abnormalities of gait and mobility: Secondary | ICD-10-CM | POA: Diagnosis not present

## 2023-04-22 DIAGNOSIS — M542 Cervicalgia: Secondary | ICD-10-CM | POA: Diagnosis not present

## 2023-04-27 DIAGNOSIS — R269 Unspecified abnormalities of gait and mobility: Secondary | ICD-10-CM | POA: Diagnosis not present

## 2023-04-27 DIAGNOSIS — M6281 Muscle weakness (generalized): Secondary | ICD-10-CM | POA: Diagnosis not present

## 2023-04-27 DIAGNOSIS — M542 Cervicalgia: Secondary | ICD-10-CM | POA: Diagnosis not present

## 2023-04-27 DIAGNOSIS — M47812 Spondylosis without myelopathy or radiculopathy, cervical region: Secondary | ICD-10-CM | POA: Diagnosis not present

## 2023-05-01 ENCOUNTER — Encounter: Payer: Self-pay | Admitting: Internal Medicine

## 2023-05-03 ENCOUNTER — Ambulatory Visit (INDEPENDENT_AMBULATORY_CARE_PROVIDER_SITE_OTHER): Payer: Medicare Other | Admitting: Internal Medicine

## 2023-05-03 ENCOUNTER — Encounter: Payer: Self-pay | Admitting: Internal Medicine

## 2023-05-03 VITALS — BP 122/74 | HR 74 | Temp 98.1°F | Ht 69.0 in | Wt 178.0 lb

## 2023-05-03 DIAGNOSIS — E785 Hyperlipidemia, unspecified: Secondary | ICD-10-CM

## 2023-05-03 DIAGNOSIS — N1831 Chronic kidney disease, stage 3a: Secondary | ICD-10-CM | POA: Diagnosis not present

## 2023-05-03 DIAGNOSIS — I10 Essential (primary) hypertension: Secondary | ICD-10-CM

## 2023-05-03 DIAGNOSIS — R269 Unspecified abnormalities of gait and mobility: Secondary | ICD-10-CM | POA: Diagnosis not present

## 2023-05-03 DIAGNOSIS — M47812 Spondylosis without myelopathy or radiculopathy, cervical region: Secondary | ICD-10-CM | POA: Diagnosis not present

## 2023-05-03 DIAGNOSIS — M542 Cervicalgia: Secondary | ICD-10-CM | POA: Diagnosis not present

## 2023-05-03 DIAGNOSIS — M6281 Muscle weakness (generalized): Secondary | ICD-10-CM | POA: Diagnosis not present

## 2023-05-03 LAB — HEPATIC FUNCTION PANEL
ALT: 23 U/L (ref 0–53)
AST: 25 U/L (ref 0–37)
Albumin: 4.4 g/dL (ref 3.5–5.2)
Alkaline Phosphatase: 54 U/L (ref 39–117)
Bilirubin, Direct: 0.1 mg/dL (ref 0.0–0.3)
Total Bilirubin: 0.6 mg/dL (ref 0.2–1.2)
Total Protein: 7.3 g/dL (ref 6.0–8.3)

## 2023-05-03 LAB — CBC WITH DIFFERENTIAL/PLATELET
Basophils Absolute: 0.1 10*3/uL (ref 0.0–0.1)
Basophils Relative: 0.9 % (ref 0.0–3.0)
Eosinophils Absolute: 0.2 10*3/uL (ref 0.0–0.7)
Eosinophils Relative: 2.7 % (ref 0.0–5.0)
HCT: 43.6 % (ref 39.0–52.0)
Hemoglobin: 14.2 g/dL (ref 13.0–17.0)
Lymphocytes Relative: 14.5 % (ref 12.0–46.0)
Lymphs Abs: 1.3 10*3/uL (ref 0.7–4.0)
MCHC: 32.5 g/dL (ref 30.0–36.0)
MCV: 92.8 fl (ref 78.0–100.0)
Monocytes Absolute: 0.9 10*3/uL (ref 0.1–1.0)
Monocytes Relative: 10.2 % (ref 3.0–12.0)
Neutro Abs: 6.5 10*3/uL (ref 1.4–7.7)
Neutrophils Relative %: 71.7 % (ref 43.0–77.0)
Platelets: 272 10*3/uL (ref 150.0–400.0)
RBC: 4.7 Mil/uL (ref 4.22–5.81)
RDW: 13 % (ref 11.5–15.5)
WBC: 9.1 10*3/uL (ref 4.0–10.5)

## 2023-05-03 LAB — LIPID PANEL
Cholesterol: 132 mg/dL (ref 0–200)
HDL: 48.9 mg/dL (ref >39.00)
LDL Cholesterol: 58 mg/dL (ref 0–99)
NonHDL: 82.79
Total CHOL/HDL Ratio: 3
Triglycerides: 124 mg/dL (ref 0.0–149.0)
VLDL: 24.8 mg/dL (ref 0.0–40.0)

## 2023-05-03 LAB — BASIC METABOLIC PANEL
BUN: 22 mg/dL (ref 6–23)
CO2: 30 mEq/L (ref 19–32)
Calcium: 10.2 mg/dL (ref 8.4–10.5)
Chloride: 102 mEq/L (ref 96–112)
Creatinine, Ser: 1.22 mg/dL (ref 0.40–1.50)
GFR: 58.11 mL/min — ABNORMAL LOW (ref >60.00)
Glucose, Bld: 117 mg/dL — ABNORMAL HIGH (ref 70–99)
Potassium: 4.7 mEq/L (ref 3.5–5.1)
Sodium: 140 mEq/L (ref 135–145)

## 2023-05-03 LAB — TSH: TSH: 1.61 u[IU]/mL (ref 0.35–5.50)

## 2023-05-03 LAB — CORTISOL: Cortisol, Plasma: 7.8 ug/dL

## 2023-05-03 NOTE — Patient Instructions (Signed)
Hypertension, Adult High blood pressure (hypertension) is when the force of blood pumping through the arteries is too strong. The arteries are the blood vessels that carry blood from the heart throughout the body. Hypertension forces the heart to work harder to pump blood and may cause arteries to become narrow or stiff. Untreated or uncontrolled hypertension can lead to a heart attack, heart failure, a stroke, kidney disease, and other problems. A blood pressure reading consists of a higher number over a lower number. Ideally, your blood pressure should be below 120/80. The first ("top") number is called the systolic pressure. It is a measure of the pressure in your arteries as your heart beats. The second ("bottom") number is called the diastolic pressure. It is a measure of the pressure in your arteries as the heart relaxes. What are the causes? The exact cause of this condition is not known. There are some conditions that result in high blood pressure. What increases the risk? Certain factors may make you more likely to develop high blood pressure. Some of these risk factors are under your control, including: Smoking. Not getting enough exercise or physical activity. Being overweight. Having too much fat, sugar, calories, or salt (sodium) in your diet. Drinking too much alcohol. Other risk factors include: Having a personal history of heart disease, diabetes, high cholesterol, or kidney disease. Stress. Having a family history of high blood pressure and high cholesterol. Having obstructive sleep apnea. Age. The risk increases with age. What are the signs or symptoms? High blood pressure may not cause symptoms. Very high blood pressure (hypertensive crisis) may cause: Headache. Fast or irregular heartbeats (palpitations). Shortness of breath. Nosebleed. Nausea and vomiting. Vision changes. Severe chest pain, dizziness, and seizures. How is this diagnosed? This condition is diagnosed by  measuring your blood pressure while you are seated, with your arm resting on a flat surface, your legs uncrossed, and your feet flat on the floor. The cuff of the blood pressure monitor will be placed directly against the skin of your upper arm at the level of your heart. Blood pressure should be measured at least twice using the same arm. Certain conditions can cause a difference in blood pressure between your right and left arms. If you have a high blood pressure reading during one visit or you have normal blood pressure with other risk factors, you may be asked to: Return on a different day to have your blood pressure checked again. Monitor your blood pressure at home for 1 week or longer. If you are diagnosed with hypertension, you may have other blood or imaging tests to help your health care provider understand your overall risk for other conditions. How is this treated? This condition is treated by making healthy lifestyle changes, such as eating healthy foods, exercising more, and reducing your alcohol intake. You may be referred for counseling on a healthy diet and physical activity. Your health care provider may prescribe medicine if lifestyle changes are not enough to get your blood pressure under control and if: Your systolic blood pressure is above 130. Your diastolic blood pressure is above 80. Your personal target blood pressure may vary depending on your medical conditions, your age, and other factors. Follow these instructions at home: Eating and drinking  Eat a diet that is high in fiber and potassium, and low in sodium, added sugar, and fat. An example of this eating plan is called the DASH diet. DASH stands for Dietary Approaches to Stop Hypertension. To eat this way: Eat   plenty of fresh fruits and vegetables. Try to fill one half of your plate at each meal with fruits and vegetables. Eat whole grains, such as whole-wheat pasta, brown rice, or whole-grain bread. Fill about one  fourth of your plate with whole grains. Eat or drink low-fat dairy products, such as skim milk or low-fat yogurt. Avoid fatty cuts of meat, processed or cured meats, and poultry with skin. Fill about one fourth of your plate with lean proteins, such as fish, chicken without skin, beans, eggs, or tofu. Avoid pre-made and processed foods. These tend to be higher in sodium, added sugar, and fat. Reduce your daily sodium intake. Many people with hypertension should eat less than 1,500 mg of sodium a day. Do not drink alcohol if: Your health care provider tells you not to drink. You are pregnant, may be pregnant, or are planning to become pregnant. If you drink alcohol: Limit how much you have to: 0-1 drink a day for women. 0-2 drinks a day for men. Know how much alcohol is in your drink. In the [place] one drink equals one 12 oz bottle of beer (355 mL), one 5 oz glass of wine (148 mL), or one 1 oz glass of hard liquor (44 mL). Lifestyle  Work with your health care provider to maintain a healthy body weight or to lose weight. Ask what an ideal weight is for you. Get at least 30 minutes of exercise that causes your heart to beat faster (aerobic exercise) most days of the week. Activities may include walking, swimming, or biking. Include exercise to strengthen your muscles (resistance exercise), such as Pilates or lifting weights, as part of your weekly exercise routine. Try to do these types of exercises for 30 minutes at least 3 days a week. Do not use any products that contain nicotine or tobacco. These products include cigarettes, chewing tobacco, and vaping devices, such as e-cigarettes. If you need help quitting, ask your health care provider. Monitor your blood pressure at home as told by your health care provider. Keep all follow-up visits. This is important. Medicines Take over-the-counter and prescription medicines only as told by your health care provider. Follow directions carefully. Blood  pressure medicines must be taken as prescribed. Do not skip doses of blood pressure medicine. Doing this puts you at risk for problems and can make the medicine less effective. Ask your health care provider about side effects or reactions to medicines that you should watch for. Contact a health care provider if you: Think you are having a reaction to a medicine you are taking. Have headaches that keep coming back (recurring). Feel dizzy. Have swelling in your ankles. Have trouble with your vision. Get help right away if you: Develop a severe headache or confusion. Have unusual weakness or numbness. Feel faint. Have severe pain in your chest or abdomen. Vomit repeatedly. Have trouble breathing. These symptoms may be an emergency. Get help right away. Call 911. Do not wait to see if the symptoms will go away. Do not drive yourself to the hospital. Summary Hypertension is when the force of blood pumping through your arteries is too strong. If this condition is not controlled, it may put you at risk for serious complications. Your personal target blood pressure may vary depending on your medical conditions, your age, and other factors. For most people, a normal blood pressure is less than 120/80. Hypertension is treated with lifestyle changes, medicines, or a combination of both. Lifestyle changes include losing weight, eating a healthy,   low-sodium diet, exercising more, and limiting alcohol. This information is not intended to replace advice given to you by your health care provider. Make sure you discuss any questions you have with your health care provider. Document Revised: 08/19/2021 Document Reviewed: 08/19/2021 Elsevier Patient Education  2024 [employer]  

## 2023-05-03 NOTE — Telephone Encounter (Signed)
Will you get him on my schedule?

## 2023-05-03 NOTE — Progress Notes (Signed)
Subjective:  Patient ID: Peter Patch, MD, male    DOB: 1945-12-18  Age: 77 y.o. MRN: [id number]  CC: Hypertension and Hyperlipidemia   HPI Peter Patch, MD presents for f/up ---  Discussed the use of AI scribe software for clinical note transcription with the patient, who gave verbal consent to proceed.  History of Present Illness   The patient, with a history of hypertension and recent COVID-19 infection, presents with complaints of lightheadedness and fatigue. They report that their blood pressure was unusually low at 98/50 for two consecutive days, which led them to discontinue all antihypertensive medications. The patient believes that the COVID-19 infection may have contributed to these symptoms.  The patient also reports experiencing intermittent skipped heartbeats, which they noticed while monitoring their blood pressure. However, they deny any chest pain, shortness of breath, or severe dizziness that could suggest syncope. There is no reported edema in the legs or feet.  The patient's energy levels have been lower than usual, which they attribute to the recent COVID-19 infection. Despite this, they have been able to engage in physical activities such as walking for three miles and working outside for a couple of hours.  The patient has not been taking any medications for cough or wheezing, and they have not been using albuterol or Tessalon. They are considering whether to restart their diuretic medication and are contemplating further evaluation for possible adrenal insufficiency.       Outpatient Medications Prior to Visit  Medication Sig Dispense Refill   albuterol (VENTOLIN HFA) 108 (90 Base) MCG/ACT inhaler Inhale 2 puffs into the lungs every 6 (six) hours as needed for wheezing or shortness of breath. 8 g 2   alfuzosin (UROXATRAL) 10 MG 24 hr tablet TAKE 1 TABLET BY MOUTH EVERY DAY IN THE EVENING 90 tablet 0   ALPRAZolam (XANAX) 0.5 MG tablet Take 1 tablet (0.5 mg total) by  mouth 2 (two) times daily as needed for anxiety. 180 tablet 0   amLODipine (NORVASC) 5 MG tablet Take 5 mg by mouth daily.     aspirin EC 81 MG tablet Take 81 mg by mouth at bedtime. Swallow whole.     celecoxib (CELEBREX) 100 MG capsule Take 1 capsule (100 mg total) by mouth 2 (two) times daily as needed. (Patient taking differently: Take 100 mg by mouth in the morning.) 60 capsule 3   dextromethorphan (DELSYM) 30 MG/5ML liquid Take 5 mLs (30 mg total) by mouth 2 (two) times daily. 148 mL 0   fluticasone (CUTIVATE) 0.05 % cream Apply 1 Application topically 2 (two) times daily. 30 g 0   montelukast (SINGULAIR) 10 MG tablet TAKE 1 TABLET BY MOUTH EVERYDAY AT BEDTIME (Patient taking differently: Take 10 mg by mouth at bedtime.) 90 tablet 0   olmesartan (BENICAR) 20 MG tablet TAKE 1 TABLET BY MOUTH EVERY DAY 90 tablet 0   pantoprazole (PROTONIX) 40 MG tablet TAKE 1 TABLET 1 OR 2 TIMES DAILY 30 MINUTES BEFORE MEALS 180 tablet 0   Probiotic Product (ALIGN) 4 MG CAPS Take 4 mg by mouth in the morning.     rosuvastatin (CRESTOR) 10 MG tablet TAKE 1 TABLET BY MOUTH EVERYDAY AT BEDTIME 90 tablet 1   traMADol (ULTRAM) 50 MG tablet Take 1 tablet (50 mg total) by mouth at bedtime. 90 tablet 1   benzonatate (TESSALON) 100 MG capsule Take 1 capsule (100 mg total) by mouth 2 (two) times daily as needed for cough. 20 capsule 0  indapamide (LOZOL) 1.25 MG tablet TAKE 1 TABLET BY MOUTH EVERY DAY 90 tablet 0   No facility-administered medications prior to visit.    ROS Review of Systems  Constitutional:  Positive for fatigue. Negative for appetite change, chills, diaphoresis and unexpected weight change.  HENT: Negative.    Eyes: Negative.   Respiratory:  Negative for chest tightness, shortness of breath and wheezing.   Cardiovascular:  Negative for chest pain, palpitations and leg swelling.  Gastrointestinal:  Negative for abdominal pain, constipation, diarrhea, nausea and vomiting.  Endocrine: Negative.    Genitourinary: Negative.  Negative for difficulty urinating and dysuria.  Musculoskeletal:  Positive for arthralgias, back pain and neck pain. Negative for myalgias.  Skin: Negative.   Neurological:  Positive for dizziness and light-headedness. Negative for syncope and weakness.  Hematological:  Negative for adenopathy. Does not bruise/bleed easily.  Psychiatric/Behavioral: Negative.      Objective:  BP 122/74 (BP Location: Right Arm, Patient Position: Sitting, Cuff Size: Large)   Pulse 74   Temp 98.1 F (36.7 C) (Oral)   Ht 5\' 9"  (1.753 m)   Wt 178 lb (80.7 kg)   BMI 26.29 kg/m   BP Readings from Last 3 Encounters:  05/03/23 122/74  04/20/23 133/73  02/23/23 (!) 141/81    Wt Readings from Last 3 Encounters:  05/03/23 178 lb (80.7 kg)  01/04/23 175 lb (79.4 kg)  12/22/22 177 lb (80.3 kg)    Physical Exam Vitals reviewed.  Constitutional:      Appearance: Normal appearance.  HENT:     Nose: Nose normal.     Mouth/Throat:     Mouth: Mucous membranes are moist.  Eyes:     General: No scleral icterus.    Conjunctiva/sclera: Conjunctivae normal.  Cardiovascular:     Rate and Rhythm: Normal rate and regular rhythm.     Heart sounds: No murmur heard. Pulmonary:     Effort: Pulmonary effort is normal. No respiratory distress.     Breath sounds: No stridor. No wheezing, rhonchi or rales.  Chest:     Chest wall: No tenderness.  Abdominal:     General: Abdomen is flat.     Palpations: There is no mass.     Tenderness: There is no abdominal tenderness. There is no guarding.     Hernia: No hernia is present.  Musculoskeletal:        General: Normal range of motion.     Cervical back: Neck supple.     Right lower leg: No edema.     Left lower leg: No edema.  Lymphadenopathy:     Cervical: No cervical adenopathy.  Skin:    General: Skin is warm and dry.  Neurological:     General: No focal deficit present.     Mental Status: He is alert.  Psychiatric:        Mood  and Affect: Mood normal.        Behavior: Behavior normal.     Lab Results  Component Value Date   WBC 9.1 05/03/2023   HGB 14.2 05/03/2023   HCT 43.6 05/03/2023   PLT 272.0 05/03/2023   GLUCOSE 117 (H) 05/03/2023   CHOL 132 05/03/2023   TRIG 124.0 05/03/2023   HDL 48.90 05/03/2023   LDLDIRECT 151.6 04/10/2011   LDLCALC 58 05/03/2023   ALT 23 05/03/2023   AST 25 05/03/2023   NA 140 05/03/2023   K 4.7 05/03/2023   CL 102 05/03/2023   CREATININE 1.22 05/03/2023  BUN 22 05/03/2023   CO2 30 05/03/2023   TSH 1.61 05/03/2023   PSA 2.01 10/05/2022   INR 1.0 03/09/2017    DG FACET JT INJ C/T SINGLE LEVEL LEFT W/FL/CT  Result Date: 04/20/2023 CLINICAL DATA:  Left cervicalgia and radicular symptoms. Left-sided facet arthropathy C3-4. EXAM: Left C3-4 cervical facet injection COMPARISON:  MRI 12/13/2022 PROCEDURE: The procedure, risks, benefits, and alternatives were explained to the patient. Questions regarding the procedure were encouraged and answered. The patient understands and consents to the procedure. Direct posterior approach was taken to the left C3-4 facet joint using a 25 gauge spinal needle. Intra-articular positioning is confirmed by injecting a drop of Isovue 200. 0.5 cc dexamethasone was injected into the joint. The procedure was well-tolerated. No apparent complications. FLUOROSCOPY: 1 minute 54 seconds.  20.08 micro gray meter squared IMPRESSION: Technically successful leftC3-25facet injection . Electronically Signed   By: Paulina Fusi [name]   On: 04/20/2023 10:05    Assessment & Plan:   Dyslipidemia, goal LDL below 70 - LDL goal achieved. Doing well on the statin  -     Lipid panel; Future -     TSH; Future -     Hepatic function panel; Future  Primary hypertension- His BP is over-controlled. Will discontinue indapamide. -     Basic metabolic panel; Future -     CBC with Differential/Platelet; Future -     Hepatic function panel; Future -     Cortisol;  Future  Stage 3a chronic kidney disease (HCC) - Will avoid nephrotoxic agents.     Follow-up: Return in about 6 months (around 11/03/2023).  Sanda Linger, MD

## 2023-05-04 ENCOUNTER — Encounter: Payer: Self-pay | Admitting: Internal Medicine

## 2023-05-04 DIAGNOSIS — N1831 Chronic kidney disease, stage 3a: Secondary | ICD-10-CM | POA: Insufficient documentation

## 2023-05-05 DIAGNOSIS — H25013 Cortical age-related cataract, bilateral: Secondary | ICD-10-CM | POA: Diagnosis not present

## 2023-05-05 DIAGNOSIS — H524 Presbyopia: Secondary | ICD-10-CM | POA: Diagnosis not present

## 2023-05-05 DIAGNOSIS — H04123 Dry eye syndrome of bilateral lacrimal glands: Secondary | ICD-10-CM | POA: Diagnosis not present

## 2023-05-05 DIAGNOSIS — D23122 Other benign neoplasm of skin of left lower eyelid, including canthus: Secondary | ICD-10-CM | POA: Diagnosis not present

## 2023-05-05 DIAGNOSIS — H5213 Myopia, bilateral: Secondary | ICD-10-CM | POA: Diagnosis not present

## 2023-05-05 DIAGNOSIS — H52203 Unspecified astigmatism, bilateral: Secondary | ICD-10-CM | POA: Diagnosis not present

## 2023-05-05 DIAGNOSIS — H0100A Unspecified blepharitis right eye, upper and lower eyelids: Secondary | ICD-10-CM | POA: Diagnosis not present

## 2023-05-05 DIAGNOSIS — H0100B Unspecified blepharitis left eye, upper and lower eyelids: Secondary | ICD-10-CM | POA: Diagnosis not present

## 2023-05-05 DIAGNOSIS — H43813 Vitreous degeneration, bilateral: Secondary | ICD-10-CM | POA: Diagnosis not present

## 2023-05-05 DIAGNOSIS — H2513 Age-related nuclear cataract, bilateral: Secondary | ICD-10-CM | POA: Diagnosis not present

## 2023-05-11 ENCOUNTER — Other Ambulatory Visit: Payer: Self-pay | Admitting: Internal Medicine

## 2023-05-11 DIAGNOSIS — I10 Essential (primary) hypertension: Secondary | ICD-10-CM

## 2023-05-12 DIAGNOSIS — M542 Cervicalgia: Secondary | ICD-10-CM | POA: Diagnosis not present

## 2023-05-12 DIAGNOSIS — M47812 Spondylosis without myelopathy or radiculopathy, cervical region: Secondary | ICD-10-CM | POA: Diagnosis not present

## 2023-05-12 DIAGNOSIS — M6281 Muscle weakness (generalized): Secondary | ICD-10-CM | POA: Diagnosis not present

## 2023-05-12 DIAGNOSIS — R269 Unspecified abnormalities of gait and mobility: Secondary | ICD-10-CM | POA: Diagnosis not present

## 2023-05-18 ENCOUNTER — Telehealth: Payer: Self-pay | Admitting: Internal Medicine

## 2023-05-18 NOTE — Telephone Encounter (Signed)
Patient is requesting lab work on 05/08/2023 when he comes in for his AWV. Can you put in a order for his creatinine to be checked again?

## 2023-05-20 DIAGNOSIS — R269 Unspecified abnormalities of gait and mobility: Secondary | ICD-10-CM | POA: Diagnosis not present

## 2023-05-20 DIAGNOSIS — M6281 Muscle weakness (generalized): Secondary | ICD-10-CM | POA: Diagnosis not present

## 2023-05-20 DIAGNOSIS — M47812 Spondylosis without myelopathy or radiculopathy, cervical region: Secondary | ICD-10-CM | POA: Diagnosis not present

## 2023-05-20 DIAGNOSIS — M542 Cervicalgia: Secondary | ICD-10-CM | POA: Diagnosis not present

## 2023-05-25 DIAGNOSIS — M47812 Spondylosis without myelopathy or radiculopathy, cervical region: Secondary | ICD-10-CM | POA: Diagnosis not present

## 2023-05-25 DIAGNOSIS — M6281 Muscle weakness (generalized): Secondary | ICD-10-CM | POA: Diagnosis not present

## 2023-05-25 DIAGNOSIS — R269 Unspecified abnormalities of gait and mobility: Secondary | ICD-10-CM | POA: Diagnosis not present

## 2023-05-25 DIAGNOSIS — M542 Cervicalgia: Secondary | ICD-10-CM | POA: Diagnosis not present

## 2023-06-02 ENCOUNTER — Other Ambulatory Visit: Payer: Self-pay | Admitting: Internal Medicine

## 2023-06-02 DIAGNOSIS — M5020 Other cervical disc displacement, unspecified cervical region: Secondary | ICD-10-CM

## 2023-06-02 DIAGNOSIS — M1711 Unilateral primary osteoarthritis, right knee: Secondary | ICD-10-CM

## 2023-06-02 DIAGNOSIS — M1712 Unilateral primary osteoarthritis, left knee: Secondary | ICD-10-CM

## 2023-06-02 DIAGNOSIS — M5136 Other intervertebral disc degeneration, lumbar region: Secondary | ICD-10-CM

## 2023-06-02 DIAGNOSIS — M159 Polyosteoarthritis, unspecified: Secondary | ICD-10-CM

## 2023-06-02 DIAGNOSIS — M48062 Spinal stenosis, lumbar region with neurogenic claudication: Secondary | ICD-10-CM

## 2023-06-02 DIAGNOSIS — M1189 Other specified crystal arthropathies, multiple sites: Secondary | ICD-10-CM

## 2023-06-02 DIAGNOSIS — N1831 Chronic kidney disease, stage 3a: Secondary | ICD-10-CM

## 2023-06-02 DIAGNOSIS — M4716 Other spondylosis with myelopathy, lumbar region: Secondary | ICD-10-CM

## 2023-06-02 MED ORDER — TRAMADOL HCL 50 MG PO TABS
50.0000 mg | ORAL_TABLET | Freq: Two times a day (BID) | ORAL | 0 refills | Status: DC | PRN
Start: 2023-06-02 — End: 2023-08-21

## 2023-06-04 ENCOUNTER — Other Ambulatory Visit: Payer: Self-pay | Admitting: Internal Medicine

## 2023-06-04 ENCOUNTER — Encounter: Payer: Self-pay | Admitting: Internal Medicine

## 2023-06-04 ENCOUNTER — Other Ambulatory Visit (INDEPENDENT_AMBULATORY_CARE_PROVIDER_SITE_OTHER): Payer: Medicare Other

## 2023-06-04 DIAGNOSIS — M47812 Spondylosis without myelopathy or radiculopathy, cervical region: Secondary | ICD-10-CM | POA: Diagnosis not present

## 2023-06-04 DIAGNOSIS — R3912 Poor urinary stream: Secondary | ICD-10-CM | POA: Diagnosis not present

## 2023-06-04 DIAGNOSIS — N401 Enlarged prostate with lower urinary tract symptoms: Secondary | ICD-10-CM | POA: Diagnosis not present

## 2023-06-04 DIAGNOSIS — R269 Unspecified abnormalities of gait and mobility: Secondary | ICD-10-CM | POA: Diagnosis not present

## 2023-06-04 DIAGNOSIS — M542 Cervicalgia: Secondary | ICD-10-CM | POA: Diagnosis not present

## 2023-06-04 DIAGNOSIS — M6281 Muscle weakness (generalized): Secondary | ICD-10-CM | POA: Diagnosis not present

## 2023-06-04 DIAGNOSIS — N1831 Chronic kidney disease, stage 3a: Secondary | ICD-10-CM | POA: Diagnosis not present

## 2023-06-04 LAB — CBC WITH DIFFERENTIAL/PLATELET
Basophils Absolute: 0.1 10*3/uL (ref 0.0–0.1)
Basophils Relative: 1.2 % (ref 0.0–3.0)
Eosinophils Absolute: 0.3 10*3/uL (ref 0.0–0.7)
Eosinophils Relative: 4.3 % (ref 0.0–5.0)
HCT: 44 % (ref 39.0–52.0)
Hemoglobin: 14.5 g/dL (ref 13.0–17.0)
Lymphocytes Relative: 16.4 % (ref 12.0–46.0)
Lymphs Abs: 1.2 10*3/uL (ref 0.7–4.0)
MCHC: 32.9 g/dL (ref 30.0–36.0)
MCV: 92.3 fl (ref 78.0–100.0)
Monocytes Absolute: 0.9 10*3/uL (ref 0.1–1.0)
Monocytes Relative: 11.4 % (ref 3.0–12.0)
Neutro Abs: 5.1 10*3/uL (ref 1.4–7.7)
Neutrophils Relative %: 66.7 % (ref 43.0–77.0)
Platelets: 307 10*3/uL (ref 150.0–400.0)
RBC: 4.77 Mil/uL (ref 4.22–5.81)
RDW: 13 % (ref 11.5–15.5)
WBC: 7.6 10*3/uL (ref 4.0–10.5)

## 2023-06-04 LAB — BASIC METABOLIC PANEL
BUN: 22 mg/dL (ref 6–23)
CO2: 29 mEq/L (ref 19–32)
Calcium: 9.7 mg/dL (ref 8.4–10.5)
Chloride: 101 mEq/L (ref 96–112)
Creatinine, Ser: 1.33 mg/dL (ref 0.40–1.50)
GFR: 52.36 mL/min — ABNORMAL LOW (ref >60.00)
Glucose, Bld: 102 mg/dL — ABNORMAL HIGH (ref 70–99)
Potassium: 5.1 mEq/L (ref 3.5–5.1)
Sodium: 140 mEq/L (ref 135–145)

## 2023-06-04 LAB — PSA: PSA: 2.1 ng/mL (ref 0.10–4.00)

## 2023-06-07 ENCOUNTER — Ambulatory Visit: Payer: Medicare Other

## 2023-06-07 ENCOUNTER — Telehealth: Payer: Self-pay

## 2023-06-07 VITALS — BP 112/70 | Ht 69.0 in | Wt 178.0 lb

## 2023-06-07 DIAGNOSIS — Z0000 Encounter for general adult medical examination without abnormal findings: Secondary | ICD-10-CM

## 2023-06-07 NOTE — Patient Instructions (Signed)
Peter [name] , Thank you for taking time to come for your Medicare Wellness Visit. I appreciate your ongoing commitment to your health goals. Please review the following plan we discussed and let me know if I can assist you in the future.   Referrals/Orders/Follow-Ups/Clinician Recommendations: It was a pleasure speaking with you today.  Keep up the good work.  Aim for 30 minutes of exercise or brisk walking, 6-8 glasses of water, and 5 servings of fruits and vegetables each day.   This is a list of the screening recommended for you and due dates:  Health Maintenance  Topic Date Due   COVID-19 Vaccine (8 - 2023-24 season) 02/28/2023   Flu Shot  05/27/2023   Medicare Annual Wellness Visit  06/06/2024   Pneumonia Vaccine (3 of 3 - PPSV23 or PCV20) 07/19/2024   Colon Cancer Screening  04/18/2030   DTaP/Tdap/Td vaccine (3 - Td or Tdap) 10/17/2032   Hepatitis C Screening  Completed   Zoster (Shingles) Vaccine  Completed   HPV Vaccine  Aged Out    Advanced directives: (Copy Requested) Please bring a copy of your health care power of attorney and living will to the office to be added to your chart at your convenience.  Next Medicare Annual Wellness Visit scheduled for next year: Yes  Preventive Care 19 Years and Older, Male  Preventive care refers to lifestyle choices and visits with your health care provider that can promote health and wellness. What does preventive care include? A yearly physical exam. This is also called an annual well check. Dental exams once or twice a year. Routine eye exams. Ask your health care provider how often you should have your eyes checked. Personal lifestyle choices, including: Daily care of your teeth and gums. Regular physical activity. Eating a healthy diet. Avoiding tobacco and drug use. Limiting alcohol use. Practicing safe sex. Taking low doses of aspirin every day. Taking vitamin and mineral supplements as recommended by your health care  provider. What happens during an annual well check? The services and screenings done by your health care provider during your annual well check will depend on your age, overall health, lifestyle risk factors, and family history of disease. Counseling  Your health care provider may ask you questions about your: Alcohol use. Tobacco use. Drug use. Emotional well-being. Home and relationship well-being. Sexual activity. Eating habits. History of falls. Memory and ability to understand (cognition). Work and work Astronomer. Screening  You may have the following tests or measurements: Height, weight, and BMI. Blood pressure. Lipid and cholesterol levels. These may be checked every 5 years, or more frequently if you are over 48 years old. Skin check. Lung cancer screening. You may have this screening every year starting at age 57 if you have a 30-pack-year history of smoking and currently smoke or have quit within the past 15 years. Fecal occult blood test (FOBT) of the stool. You may have this test every year starting at age 48. Flexible sigmoidoscopy or colonoscopy. You may have a sigmoidoscopy every 5 years or a colonoscopy every 10 years starting at age 67. Prostate cancer screening. Recommendations will vary depending on your family history and other risks. Hepatitis C blood test. Hepatitis B blood test. Sexually transmitted disease (STD) testing. Diabetes screening. This is done by checking your blood sugar (glucose) after you have not eaten for a while (fasting). You may have this done every 1-3 years. Abdominal aortic aneurysm (AAA) screening. You may need this if you are a current  or former smoker. Osteoporosis. You may be screened starting at age 20 if you are at high risk. Talk with your health care provider about your test results, treatment options, and if necessary, the need for more tests. Vaccines  Your health care provider may recommend certain vaccines, such  as: Influenza vaccine. This is recommended every year. Tetanus, diphtheria, and acellular pertussis (Tdap, Td) vaccine. You may need a Td booster every 10 years. Zoster vaccine. You may need this after age 42. Pneumococcal 13-valent conjugate (PCV13) vaccine. One dose is recommended after age 30. Pneumococcal polysaccharide (PPSV23) vaccine. One dose is recommended after age 62. Talk to your health care provider about which screenings and vaccines you need and how often you need them. This information is not intended to replace advice given to you by your health care provider. Make sure you discuss any questions you have with your health care provider. Document Released: 11/08/2015 Document Revised: 07/01/2016 Document Reviewed: 08/13/2015 Elsevier Interactive Patient Education  2017 [employer].  Fall Prevention in the Home Falls can cause injuries. They can happen to people of all ages. There are many things you can do to make your home safe and to help prevent falls. What can I do on the outside of my home? Regularly fix the edges of walkways and driveways and fix any cracks. Remove anything that might make you trip as you walk through a door, such as a raised step or threshold. Trim any bushes or trees on the path to your home. Use bright outdoor lighting. Clear any walking paths of anything that might make someone trip, such as rocks or tools. Regularly check to see if handrails are loose or broken. Make sure that both sides of any steps have handrails. Any raised decks and porches should have guardrails on the edges. Have any leaves, snow, or ice cleared regularly. Use sand or salt on walking paths during winter. Clean up any spills in your garage right away. This includes oil or grease spills. What can I do in the bathroom? Use night lights. Install grab bars by the toilet and in the tub and shower. Do not use towel bars as grab bars. Use non-skid mats or decals in the tub or  shower. If you need to sit down in the shower, use a plastic, non-slip stool. Keep the floor dry. Clean up any water that spills on the floor as soon as it happens. Remove soap buildup in the tub or shower regularly. Attach bath mats securely with double-sided non-slip rug tape. Do not have throw rugs and other things on the floor that can make you trip. What can I do in the bedroom? Use night lights. Make sure that you have a light by your bed that is easy to reach. Do not use any sheets or blankets that are too big for your bed. They should not hang down onto the floor. Have a firm chair that has side arms. You can use this for support while you get dressed. Do not have throw rugs and other things on the floor that can make you trip. What can I do in the kitchen? Clean up any spills right away. Avoid walking on wet floors. Keep items that you use a lot in easy-to-reach places. If you need to reach something above you, use a strong step stool that has a grab bar. Keep electrical cords out of the way. Do not use floor polish or wax that makes floors slippery. If you must use wax,  use non-skid floor wax. Do not have throw rugs and other things on the floor that can make you trip. What can I do with my stairs? Do not leave any items on the stairs. Make sure that there are handrails on both sides of the stairs and use them. Fix handrails that are broken or loose. Make sure that handrails are as long as the stairways. Check any carpeting to make sure that it is firmly attached to the stairs. Fix any carpet that is loose or worn. Avoid having throw rugs at the top or bottom of the stairs. If you do have throw rugs, attach them to the floor with carpet tape. Make sure that you have a light switch at the top of the stairs and the bottom of the stairs. If you do not have them, ask someone to add them for you. What else can I do to help prevent falls? Wear shoes that: Do not have high heels. Have  rubber bottoms. Are comfortable and fit you well. Are closed at the toe. Do not wear sandals. If you use a stepladder: Make sure that it is fully opened. Do not climb a closed stepladder. Make sure that both sides of the stepladder are locked into place. Ask someone to hold it for you, if possible. Clearly mark and make sure that you can see: Any grab bars or handrails. First and last steps. Where the edge of each step is. Use tools that help you move around (mobility aids) if they are needed. These include: Canes. Walkers. Scooters. Crutches. Turn on the lights when you go into a dark area. Replace any light bulbs as soon as they burn out. Set up your furniture so you have a clear path. Avoid moving your furniture around. If any of your floors are uneven, fix them. If there are any pets around you, be aware of where they are. Review your medicines with your doctor. Some medicines can make you feel dizzy. This can increase your chance of falling. Ask your doctor what other things that you can do to help prevent falls. This information is not intended to replace advice given to you by your health care provider. Make sure you discuss any questions you have with your health care provider. Document Released: 08/08/2009 Document Revised: 03/19/2016 Document Reviewed: 11/16/2014 Elsevier Interactive Patient Education  2017 [employer].

## 2023-06-07 NOTE — Telephone Encounter (Signed)
He stated that he can try to see how it goes.   Pt mentioned that the reason he was on it was to decrease inflammation to prevent episodes of gout.

## 2023-06-07 NOTE — Telephone Encounter (Signed)
Patient has a question in regards to the medication Celebrex 100 mg, asking if he should start back taking it once a day being that there was no change in lab results.  Patient stated that Dr. Yetta Barre can send him his recommendation via MyChart.  Please advise

## 2023-06-07 NOTE — Progress Notes (Signed)
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  Visit Complete: In person   Review of Systems    Cardiac Risk Factors include: advanced age (>33men, >45 women);male gender;Other (see comment), Risk factor comments: CKD, CAD     Objective:    Today's Vitals   06/07/23 1026 06/07/23 1029  Weight: 178 lb (80.7 kg)   Height: 5\' 9"  (1.753 m)   PainSc:  2    Body mass index is 26.29 kg/m.     06/07/2023   10:35 AM 03/18/2021    6:36 AM 06/16/2019   11:00 AM 06/13/2019    9:35 AM 04/10/2019    3:37 PM 11/02/2018    9:27 AM 05/18/2018    6:12 AM  Advanced Directives  Does Patient Have a Medical Advance Directive? Yes Yes Yes Yes Yes Yes   Type of Estate agent of [place];Living will Healthcare Power of [place];Living will Healthcare Power of [place];Living will Healthcare Power of [place];Living will Healthcare Power of [place];Living will    Does patient want to make changes to medical advance directive?   No - Patient declined  No - Patient declined  No - Patient declined  Copy of Healthcare Power of Attorney in Chart? No - copy requested Yes - validated most recent copy scanned in chart (See row information) Yes - validated most recent copy scanned in chart (See row information) Yes - validated most recent copy scanned in chart (See row information) No - copy requested      Current Medications (verified) Outpatient Encounter Medications as of 06/07/2023  Medication Sig   albuterol (VENTOLIN HFA) 108 (90 Base) MCG/ACT inhaler Inhale 2 puffs into the lungs every 6 (six) hours as needed for wheezing or shortness of breath.   alfuzosin (UROXATRAL) 10 MG 24 hr tablet TAKE 1 TABLET BY MOUTH EVERY DAY IN THE EVENING   ALPRAZolam (XANAX) 0.5 MG tablet Take 1 tablet (0.5 mg total) by mouth 2 (two) times daily as needed for anxiety.   amLODipine (NORVASC) 5 MG tablet Take 5 mg by mouth daily.    aspirin EC 81 MG tablet Take 81 mg by mouth at bedtime. Swallow whole.   dextromethorphan (DELSYM) 30 MG/5ML liquid Take 5 mLs (30 mg total) by mouth 2 (two) times daily.   fluticasone (CUTIVATE) 0.05 % cream Apply 1 Application topically 2 (two) times daily.   montelukast (SINGULAIR) 10 MG tablet TAKE 1 TABLET BY MOUTH EVERYDAY AT BEDTIME (Patient taking differently: Take 10 mg by mouth at bedtime.)   olmesartan (BENICAR) 20 MG tablet TAKE 1 TABLET BY MOUTH EVERY DAY   pantoprazole (PROTONIX) 40 MG tablet TAKE 1 TABLET 1 OR 2 TIMES DAILY 30 MINUTES BEFORE MEALS   Probiotic Product (ALIGN) 4 MG CAPS Take 4 mg by mouth in the morning.   rosuvastatin (CRESTOR) 10 MG tablet TAKE 1 TABLET BY MOUTH EVERYDAY AT BEDTIME   traMADol (ULTRAM) 50 MG tablet Take 1 tablet (50 mg total) by mouth every 12 (twelve) hours as needed.   celecoxib (CELEBREX) 100 MG capsule Take 1 capsule (100 mg total) by mouth 2 (two) times daily as needed. (Patient not taking: Reported on 06/07/2023)   No facility-administered encounter medications on file as of 06/07/2023.    Allergies (verified) Gentamycin [gentamicin], Turmeric, Bacitracin, Hydrocodone, Moxifloxacin, Neosporin [neomycin-bacitracin zn-polymyx], Other, Polymyxin b, Promethazine, Tape, and Triamcinolone acetonide   History: Past Medical History:  Diagnosis Date   Allergic  rhinitis    Anxiety    Cancer (HCC)    Melanoma -peri umbilical '02 or '03- no further problems. 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    Chest pain    Nuclear 2005, normal / coronary CTA 2008, slight mixed plaque, coronary calcium score 0.65, ejection fraction 55%, echo, March, 2008   Complication of anesthesia    itching-not sure if oxycodone or anesthesia,and shakes. Right TMJ issue  chills in past   Dizziness    Dizziness with question of presyncope April 08, 2011   DJD (degenerative joint disease)    history DDD, fingers, knees, shoulders   Ejection fraction    EF 55%, echo,  March, 2008.   Family history of Alzheimer's disease    GERD (gastroesophageal reflux disease)    Hearing impaired    bilateral hearing aids   Heart murmur    77 yrs old rheumatic fever    Heart murmur 2008   trivial tricusp regurg   History of colonic polyps    History of melanoma    History of pseudogout    History of recent steroid use    10-10-15 tapering steroid use for tx. recent bronchitis.   History of rheumatic fever    HNP (herniated nucleus pulposus), cervical    Hypercholesteremia    borderline   Hypertension    Lumbar spondylosis with myelopathy 06/09/2013   Spondylosis    Tinnitus    chronic   TMJ click    right"was aggravated with last intubation"   Past Surgical History:  Procedure Laterality Date   ANTERIOR CERVICAL DECOMP/DISCECTOMY FUSION  2003   ANTERIOR CERVICAL DECOMP/DISCECTOMY FUSION N/A 05/18/2018   Procedure: ANTERIOR CERVICAL DECOMPRESSION/DISCECTOMY FUSION - CERVICAL FIVE-CERVICAL SIX, removal of tether plate;  Surgeon: Shirlean Kelly, MD;  Location: [facility];  Service: Neurosurgery;  Laterality: N/A;   BACK SURGERY     x3   CARPAL TUNNEL RELEASE Right 10/31/2013   Procedure: RIGHT CARPAL TUNNEL RELEASE;  Surgeon: Wyn Forster., MD;  Location: [facility];  Service: Orthopedics;  Laterality: Right;   CARPAL TUNNEL RELEASE Left 11/28/2013   Procedure: LEFT CARPAL TUNNEL RELEASE;  Surgeon: Wyn Forster., MD;  Location: [facility];  Service: Orthopedics;  Laterality: Left;   CERVICAL DISCECTOMY     anterior with patellar allograft and plating   JOINT REPLACEMENT     right knee 3 years ago   LEFT HEART CATH AND CORONARY ANGIOGRAPHY N/A 03/18/2021   Procedure: LEFT HEART CATH AND CORONARY ANGIOGRAPHY;  Surgeon: Lyn Records, MD;  Location: [facility];  Service: Cardiovascular;  Laterality: N/A;   LUMBAR FUSION  12/14   MELANOMA EXCISION WITH SENTINEL LYMPH NODE BIOPSY  8/02   bilat ing node bx   ROTATOR  CUFF REPAIR Left    08/13/11  08/27/2011   TOTAL KNEE ARTHROPLASTY Right 10/07/2015   Procedure: RIGHT TOTAL KNEE ARTHROPLASTY, with synovial tissue specimen;  Surgeon: Ollen Gross, MD;  Location: [facility];  Service: Orthopedics;  Laterality: Right;   TOTAL KNEE ARTHROPLASTY Left 06/16/2019   Procedure: LEFT TOTAL KNEE ARTHROPLASTY;  Surgeon: Kathryne Hitch, MD;  Location: [facility];  Service: Orthopedics;  Laterality: Left;   Family History  Problem Relation Age of Onset   Alzheimer's disease Father    Alzheimer's disease Mother    Social History   Socioeconomic History   Marital status: Married    Spouse name: Eunice Blase   Number of children:  2   Years of education: MD   Highest education level: Not on file  Occupational History   Occupation: RADIOLOGIST    Employer: [employer]  Tobacco Use   Smoking status: Never   Smokeless tobacco: Never  Vaping Use   Vaping status: Never Used  Substance and Sexual Activity   Alcohol use: Yes    Alcohol/week: 1.0 standard drink of alcohol    Types: 1 Glasses of wine per week   Drug use: No   Sexual activity: Yes    Partners: Female  Other Topics Concern   Not on file  Social History Narrative   Married - wife = [name]   2 children- Daughter=lawyer, Son=physician   Non smoker   Exercises 3 times per week   Caffeine use: 2 cups/d   etoh use: 1-2 glasses of wine nightly   Social Determinants of Health   Financial Resource Strain: Low Risk  (06/07/2023)   Overall Financial Resource Strain (CARDIA)    Difficulty of Paying Living Expenses: Not hard at all  Food Insecurity: No Food Insecurity (06/07/2023)   Hunger Vital Sign    Worried About Running Out of Food in the Last Year: Never true    Ran Out of Food in the Last Year: Never true  Transportation Needs: No Transportation Needs (06/07/2023)   PRAPARE - Administrator, Civil Service (Medical): No    Lack of Transportation (Non-Medical): No  Physical  Activity: Sufficiently Active (06/07/2023)   Exercise Vital Sign    Days of Exercise per Week: 3 days    Minutes of Exercise per Session: 60 min  Stress: No Stress Concern Present (06/07/2023)   [employer] of Occupational Health - Occupational Stress Questionnaire    Feeling of Stress : Not at all  Social Connections: Moderately Isolated (06/07/2023)   Social Connection and Isolation Panel [NHANES]    Frequency of Communication with Friends and Family: More than three times a week    Frequency of Social Gatherings with Friends and Family: Three times a week    Attends Religious Services: Never    Active Member of Clubs or Organizations: No    Attends Engineer, structural: Never    Marital Status: Married    Tobacco Counseling Counseling given: Not Answered   Clinical Intake:  Pre-visit preparation completed: Yes  Pain : 0-10 Pain Score: 2  Pain Type: Acute pain Pain Location: Neck (C4) Pain Orientation: Left Pain Descriptors / Indicators: Tingling Pain Onset: More than a month ago Pain Frequency: Constant     BMI - recorded: 26.29 Nutritional Risks: None Diabetes: No  How often do you need to have someone help you when you read instructions, pamphlets, or other written materials from your doctor or pharmacy?: 1 - Never  Interpreter Needed?: No  Information entered by ::  , RMA   Activities of Daily Living    06/07/2023   10:32 AM  In your present state of health, do you have any difficulty performing the following activities:  Hearing? 1  Vision? 0  Difficulty concentrating or making decisions? 0  Walking or climbing stairs? 0  Dressing or bathing? 0  Doing errands, shopping? 0  Preparing Food and eating ? N  Using the Toilet? N  In the past six months, have you accidently leaked urine? N  Do you have problems with loss of bowel control? N  Managing your Medications? N  Managing your Finances? N  Housekeeping or managing your  Housekeeping? N    Patient Care Team: Etta Grandchild, MD as PCP - General (Internal Medicine) Wendall Stade, MD as PCP - Cardiology (Cardiology) Lanier Prude, MD as PCP - Electrophysiology (Cardiology) Barnett Abu, MD as Consulting Physician (Neurosurgery) Jimmey Ralph Forest Becker, DO (Osteopathic Medicine) Ollen Gross, MD as Consulting Physician (Orthopedic Surgery) Wendall Stade, MD as Consulting Physician (Cardiology) Kathryne Hitch, MD as Consulting Physician (Orthopedic Surgery) Duke Salvia, MD as Consulting Physician (Cardiology)  Indicate any recent Medical Services you may have received from other than [facility] providers in the past year (date may be approximate).     Assessment:   This is a routine wellness examination for [place].  Hearing/Vision screen Hearing Screening - Comments:: Wears Hearing aides Vision Screening - Comments:: Wears eyeglasses  Dietary issues and exercise activities discussed:     Goals Addressed   None   Depression Screen    06/07/2023   10:41 AM 11/23/2022    2:24 PM 10/08/2022    4:22 PM 04/20/2014   10:00 AM  PHQ 2/9 Scores  PHQ - 2 Score 0 0 0 0  PHQ- 9 Score 0 0 0     Fall Risk    06/07/2023   10:35 AM 11/23/2022    2:23 PM  Fall Risk   Falls in the past year? 0 0  Number falls in past yr: 0 0  Injury with Fall? 0 0  Risk for fall due to : No Fall Risks Impaired balance/gait;No Fall Risks  Follow up Falls prevention discussed Falls evaluation completed    MEDICARE RISK AT HOME:  Medicare Risk at Home - 06/07/23 1035     Any stairs in or around the home? Yes    If so, are there any without handrails? Yes    Home free of loose throw rugs in walkways, pet beds, electrical cords, etc? Yes    Adequate lighting in your home to reduce risk of falls? Yes    Life alert? No    Use of a cane, walker or w/c? No    Grab bars in the bathroom? No    Shower chair or bench in shower? Yes    Elevated toilet seat or a  handicapped toilet? Yes             TIMED UP AND GO:  Was the test performed?  Yes  Length of time to ambulate 10 feet: 15 sec Gait slow and steady without use of assistive device    Cognitive Function:        06/07/2023   10:37 AM  6CIT Screen  What Year? 0 points  What month? 0 points  What time? 0 points  Count back from 20 0 points  Months in reverse 0 points  Repeat phrase 0 points  Total Score 0 points    Immunizations Immunization History  Administered Date(s) Administered   Influenza Split 08/15/2011, 08/20/2013, 07/19/2017   Influenza Whole 08/14/2009   Influenza, High Dose Seasonal PF 07/16/2016, 08/07/2022   Influenza-Unspecified 07/11/2015, 07/19/2016, 07/22/2017   Moderna Covid-19 Vaccine Bivalent Booster 14yrs & up 07/05/2021   PFIZER(Purple Top)SARS-COV-2 Vaccination 11/06/2019, 11/24/2019, 06/12/2020, 01/22/2021, 07/23/2022   Pneumococcal Conjugate-13 04/20/2014, 07/20/2019   Pneumococcal Polysaccharide-23 10/05/2012   Tdap 04/13/2011, 10/17/2022   Zoster Recombinant(Shingrix) 02/05/2017, 06/14/2017    TDAP status: Up to date  Flu Vaccine status: Up to date  Pneumococcal vaccine status: Up to date  Covid-19 vaccine status: Information provided on how to obtain vaccines.  Qualifies for Shingles Vaccine? Yes   Zostavax completed Yes   Shingrix Completed?: Yes  Screening Tests Health Maintenance  Topic Date Due   COVID-19 Vaccine (7 - 2023-24 season) 09/17/2022   INFLUENZA VACCINE  05/27/2023   Medicare Annual Wellness (AWV)  06/06/2024   Pneumonia Vaccine 41+ Years old (3 of 3 - PPSV23 or PCV20) 07/19/2024   Colonoscopy  04/18/2030   DTaP/Tdap/Td (3 - Td or Tdap) 10/17/2032   Hepatitis C Screening  Completed   Zoster Vaccines- Shingrix  Completed   HPV VACCINES  Aged Out    Health Maintenance  Health Maintenance Due  Topic Date Due   COVID-19 Vaccine (7 - 2023-24 season) 09/17/2022   INFLUENZA VACCINE  05/27/2023     Colorectal cancer screening: Type of screening: Colonoscopy. Completed 04/18/2020. Repeat every 10 years  Lung Cancer Screening: (Low Dose CT Chest recommended if Age 48-80 years, 20 pack-year currently smoking OR have quit w/in 15years.) does not qualify.   Lung Cancer Screening Referral: N/A  Additional Screening:  Hepatitis C Screening: does qualify; Completed 03/10/2017  Vision Screening: Recommended annual ophthalmology exams for early detection of glaucoma and other disorders of the eye. Is the patient up to date with their annual eye exam?  Yes  Who is the provider or what is the name of the office in which the patient attends annual eye exams? Dr. Burgess Estelle If pt is not established with a provider, would they like to be referred to a provider to establish care? No .   Dental Screening: Recommended annual dental exams for proper oral hygiene   Community Resource Referral / Chronic Care Management: CRR required this visit?  No   CCM required this visit?  No     Plan:     I have personally reviewed and noted the following in the patient's chart:   Medical and social history Use of alcohol, tobacco or illicit drugs  Current medications and supplements including opioid prescriptions. Patient is not currently taking opioid prescriptions. Functional ability and status Nutritional status Physical activity Advanced directives List of other physicians Hospitalizations, surgeries, and ER visits in previous 12 months Vitals Screenings to include cognitive, depression, and falls Referrals and appointments  In addition, I have reviewed and discussed with patient certain preventive protocols, quality metrics, and best practice recommendations. A written personalized care plan for preventive services as well as general preventive health recommendations were provided to patient.      L , CMA   06/07/2023   After Visit Summary: (MyChart) Due to this being a  telephonic visit, the after visit summary with patients personalized plan was offered to patient via MyChart   Nurse Notes: Patient has a question in regards to the medication Celebrex 100 mg, asking if he should start back taking it once a day being that there was no change in lab results.  Patient stated that Dr. Yetta Barre can send him his recommendation via MyChart.  Please advise

## 2023-06-07 NOTE — Telephone Encounter (Signed)
Can he manage the pain without celebrex?

## 2023-06-08 ENCOUNTER — Ambulatory Visit: Payer: Medicare Other

## 2023-06-11 ENCOUNTER — Other Ambulatory Visit: Payer: Self-pay | Admitting: Internal Medicine

## 2023-06-11 ENCOUNTER — Encounter: Payer: Self-pay | Admitting: Internal Medicine

## 2023-06-11 DIAGNOSIS — M1712 Unilateral primary osteoarthritis, left knee: Secondary | ICD-10-CM

## 2023-06-11 DIAGNOSIS — M159 Polyosteoarthritis, unspecified: Secondary | ICD-10-CM

## 2023-06-11 DIAGNOSIS — M5136 Other intervertebral disc degeneration, lumbar region: Secondary | ICD-10-CM

## 2023-06-11 DIAGNOSIS — M4716 Other spondylosis with myelopathy, lumbar region: Secondary | ICD-10-CM

## 2023-06-11 DIAGNOSIS — M479 Spondylosis, unspecified: Secondary | ICD-10-CM

## 2023-06-11 DIAGNOSIS — M1189 Other specified crystal arthropathies, multiple sites: Secondary | ICD-10-CM

## 2023-06-11 MED ORDER — CELECOXIB 50 MG PO CAPS
50.0000 mg | ORAL_CAPSULE | Freq: Two times a day (BID) | ORAL | 0 refills | Status: DC
Start: 2023-06-11 — End: 2023-09-03

## 2023-06-13 ENCOUNTER — Encounter: Payer: Self-pay | Admitting: Internal Medicine

## 2023-06-15 ENCOUNTER — Encounter: Payer: Self-pay | Admitting: Family Medicine

## 2023-06-15 ENCOUNTER — Ambulatory Visit: Payer: Medicare Other

## 2023-06-15 ENCOUNTER — Ambulatory Visit: Payer: Medicare Other | Admitting: Family Medicine

## 2023-06-15 ENCOUNTER — Encounter: Payer: Self-pay | Admitting: Internal Medicine

## 2023-06-15 VITALS — BP 122/78 | HR 81 | Temp 97.8°F | Ht 69.0 in | Wt 176.0 lb

## 2023-06-15 DIAGNOSIS — M7989 Other specified soft tissue disorders: Secondary | ICD-10-CM | POA: Diagnosis not present

## 2023-06-15 DIAGNOSIS — M47812 Spondylosis without myelopathy or radiculopathy, cervical region: Secondary | ICD-10-CM | POA: Diagnosis not present

## 2023-06-15 DIAGNOSIS — R269 Unspecified abnormalities of gait and mobility: Secondary | ICD-10-CM | POA: Diagnosis not present

## 2023-06-15 DIAGNOSIS — M542 Cervicalgia: Secondary | ICD-10-CM | POA: Diagnosis not present

## 2023-06-15 DIAGNOSIS — M6281 Muscle weakness (generalized): Secondary | ICD-10-CM | POA: Diagnosis not present

## 2023-06-15 NOTE — Patient Instructions (Signed)
I have ordered an ultrasound to be done at [facility]. We will follow up once we have the result. Let us know if you have any new symptoms.

## 2023-06-15 NOTE — Progress Notes (Signed)
Subjective:     Patient ID: Peter Patch, MD, male    DOB: 01/09/1946, 77 y.o.   MRN: [id number]  Chief Complaint  Patient presents with   Mass    Not hard, may be lipoma but wants new mass left arm checked. First noticed 3 days ago, denies pain    HPI  Discussed the use of AI scribe software for clinical note transcription with the patient, who gave verbal consent to proceed.  History of Present Illness          C/o left forearm mass just beneath his left antecubital area. Noticed this area approximately 3 days ago. Non tender. No new numbness, tingling or weakness of his left hand or arm.     Health Maintenance Due  Topic Date Due   INFLUENZA VACCINE  05/27/2023    Past Medical History:  Diagnosis Date   Allergic rhinitis    Anxiety    Cancer (HCC)    Melanoma -peri umbilical '02 or '03- no further problems. 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    Chest pain    Nuclear 2005, normal / coronary CTA 2008, slight mixed plaque, coronary calcium score 0.65, ejection fraction 55%, echo, March, 2008   Complication of anesthesia    itching-not sure if oxycodone or anesthesia,and shakes. Right TMJ issue  chills in past   Dizziness    Dizziness with question of presyncope April 08, 2011   DJD (degenerative joint disease)    history DDD, fingers, knees, shoulders   Ejection fraction    EF 55%, echo, March, 2008.   Family history of Alzheimer's disease    GERD (gastroesophageal reflux disease)    Hearing impaired    bilateral hearing aids   Heart murmur    77 yrs old rheumatic fever    Heart murmur 2008   trivial tricusp regurg   History of colonic polyps    History of melanoma    History of pseudogout    History of recent steroid use    10-10-15 tapering steroid use for tx. recent bronchitis.   History of rheumatic fever    HNP (herniated nucleus pulposus), cervical    Hypercholesteremia    borderline   Hypertension    Lumbar spondylosis with myelopathy  06/09/2013   Spondylosis    Tinnitus    chronic   TMJ click    right"was aggravated with last intubation"    Past Surgical History:  Procedure Laterality Date   ANTERIOR CERVICAL DECOMP/DISCECTOMY FUSION  2003   ANTERIOR CERVICAL DECOMP/DISCECTOMY FUSION N/A 05/18/2018   Procedure: ANTERIOR CERVICAL DECOMPRESSION/DISCECTOMY FUSION - CERVICAL FIVE-CERVICAL SIX, removal of tether plate;  Surgeon: Shirlean Kelly, MD;  Location: [facility];  Service: Neurosurgery;  Laterality: N/A;   BACK SURGERY     x3   CARPAL TUNNEL RELEASE Right 10/31/2013   Procedure: RIGHT CARPAL TUNNEL RELEASE;  Surgeon: Wyn Forster., MD;  Location: [facility];  Service: Orthopedics;  Laterality: Right;   CARPAL TUNNEL RELEASE Left 11/28/2013   Procedure: LEFT CARPAL TUNNEL RELEASE;  Surgeon: Wyn Forster., MD;  Location: [facility];  Service: Orthopedics;  Laterality: Left;   CERVICAL DISCECTOMY     anterior with patellar allograft and plating   JOINT REPLACEMENT     right knee 3 years ago   LEFT HEART CATH AND CORONARY ANGIOGRAPHY N/A 03/18/2021   Procedure: LEFT HEART CATH AND CORONARY ANGIOGRAPHY;  Surgeon: Lyn Records, MD;  Location: [facility];  Service: Cardiovascular;  Laterality: N/A;   LUMBAR FUSION  12/14   MELANOMA EXCISION WITH SENTINEL LYMPH NODE BIOPSY  8/02   bilat ing node bx   ROTATOR CUFF REPAIR Left    08/13/11  08/27/2011   TOTAL KNEE ARTHROPLASTY Right 10/07/2015   Procedure: RIGHT TOTAL KNEE ARTHROPLASTY, with synovial tissue specimen;  Surgeon: Ollen Gross, MD;  Location: [facility];  Service: Orthopedics;  Laterality: Right;   TOTAL KNEE ARTHROPLASTY Left 06/16/2019   Procedure: LEFT TOTAL KNEE ARTHROPLASTY;  Surgeon: Kathryne Hitch, MD;  Location: [facility];  Service: Orthopedics;  Laterality: Left;    Family History  Problem Relation Age of Onset   Alzheimer's disease Father    Alzheimer's disease Mother     Social History    Socioeconomic History   Marital status: Married    Spouse name: [name]   Number of children: 2   Years of education: MD   Highest education level: Not on file  Occupational History   Occupation: RADIOLOGIST    Employer: [employer]  Tobacco Use   Smoking status: Never   Smokeless tobacco: Never  Vaping Use   Vaping status: Never Used  Substance and Sexual Activity   Alcohol use: Yes    Alcohol/week: 1.0 standard drink of alcohol    Types: 1 Glasses of wine per week   Drug use: No   Sexual activity: Yes    Partners: Female  Other Topics Concern   Not on file  Social History Narrative   Married - wife = [name]   2 children- Daughter=lawyer, Son=physician   Non smoker   Exercises 3 times per week   Caffeine use: 2 cups/d   etoh use: 1-2 glasses of wine nightly   Social Determinants of Health   Financial Resource Strain: Low Risk  (06/07/2023)   Overall Financial Resource Strain (CARDIA)    Difficulty of Paying Living Expenses: Not hard at all  Food Insecurity: No Food Insecurity (06/07/2023)   Hunger Vital Sign    Worried About Running Out of Food in the Last Year: Never true    Ran Out of Food in the Last Year: Never true  Transportation Needs: No Transportation Needs (06/07/2023)   PRAPARE - Administrator, Civil Service (Medical): No    Lack of Transportation (Non-Medical): No  Physical Activity: Sufficiently Active (06/07/2023)   Exercise Vital Sign    Days of Exercise per Week: 3 days    Minutes of Exercise per Session: 60 min  Stress: No Stress Concern Present (06/07/2023)   [employer] of Occupational Health - Occupational Stress Questionnaire    Feeling of Stress : Not at all  Social Connections: Moderately Isolated (06/07/2023)   Social Connection and Isolation Panel [NHANES]    Frequency of Communication with Friends and Family: More than three times a week    Frequency of Social Gatherings with Friends and Family: Three times a  week    Attends Religious Services: Never    Active Member of Clubs or Organizations: No    Attends Banker Meetings: Never    Marital Status: Married  Catering manager Violence: Not At Risk (06/07/2023)   Humiliation, Afraid, Rape, and Kick questionnaire    Fear of Current or Ex-Partner: No    Emotionally Abused: No    Physically Abused: No    Sexually Abused: No    Outpatient Medications Prior to Visit  Medication Sig Dispense Refill  albuterol (VENTOLIN HFA) 108 (90 Base) MCG/ACT inhaler Inhale 2 puffs into the lungs every 6 (six) hours as needed for wheezing or shortness of breath. 8 g 2   alfuzosin (UROXATRAL) 10 MG 24 hr tablet TAKE 1 TABLET BY MOUTH EVERY DAY IN THE EVENING 90 tablet 0   ALPRAZolam (XANAX) 0.5 MG tablet Take 1 tablet (0.5 mg total) by mouth 2 (two) times daily as needed for anxiety. 180 tablet 0   amLODipine (NORVASC) 5 MG tablet Take 5 mg by mouth daily.     aspirin EC 81 MG tablet Take 81 mg by mouth at bedtime. Swallow whole.     celecoxib (CELEBREX) 50 MG capsule Take 1 capsule (50 mg total) by mouth 2 (two) times daily. 180 capsule 0   dextromethorphan (DELSYM) 30 MG/5ML liquid Take 5 mLs (30 mg total) by mouth 2 (two) times daily. 148 mL 0   fluticasone (CUTIVATE) 0.05 % cream Apply 1 Application topically 2 (two) times daily. 30 g 0   montelukast (SINGULAIR) 10 MG tablet TAKE 1 TABLET BY MOUTH EVERYDAY AT BEDTIME (Patient taking differently: Take 10 mg by mouth at bedtime.) 90 tablet 0   olmesartan (BENICAR) 20 MG tablet TAKE 1 TABLET BY MOUTH EVERY DAY 90 tablet 0   pantoprazole (PROTONIX) 40 MG tablet TAKE 1 TABLET 1 OR 2 TIMES DAILY 30 MINUTES BEFORE MEALS 180 tablet 0   Probiotic Product (ALIGN) 4 MG CAPS Take 4 mg by mouth in the morning.     rosuvastatin (CRESTOR) 10 MG tablet TAKE 1 TABLET BY MOUTH EVERYDAY AT BEDTIME 90 tablet 1   traMADol (ULTRAM) 50 MG tablet Take 1 tablet (50 mg total) by mouth every 12 (twelve) hours as needed. 180  tablet 0   No facility-administered medications prior to visit.    Allergies  Allergen Reactions   Gentamycin [Gentamicin] Hives and Itching   Turmeric Other (See Comments)    Cardiac arrythmias   Bacitracin Rash   Hydrocodone Rash   Moxifloxacin Rash    pt had severe red rash- he thought from Avelox Rx....   Neosporin [Neomycin-Bacitracin Zn-Polymyx] Rash   Other Other (See Comments)    Redness  bandaide   Polymyxin B Other (See Comments) and Rash   Promethazine Other (See Comments)    Restless legs   Tape Dermatitis    bandaid-skin reaction   Triamcinolone Acetonide Itching and Rash          Review of Systems  Constitutional:  Negative for chills and fever.  Gastrointestinal:  Negative for nausea and vomiting.  Neurological:  Negative for sensory change and focal weakness.       Objective:    Physical Exam Constitutional:      General: He is not in acute distress.    Appearance: He is not ill-appearing.  Eyes:     Extraocular Movements: Extraocular movements intact.     Conjunctiva/sclera: Conjunctivae normal.  Cardiovascular:     Rate and Rhythm: Normal rate.  Pulmonary:     Effort: Pulmonary effort is normal.  Musculoskeletal:     Left upper arm: Normal.     Left elbow: Normal.     Left forearm: No tenderness.     Comments: Approximately 1 cm x 2 cm slightly firm mass of left anterior forearm approximately 1 cm distal to left antecubital space. No skin changes. Nontender. Good sensation and ROM of upper extremity.   Skin:    General: Skin is warm and dry.  Findings: No rash.  Neurological:     General: No focal deficit present.     Mental Status: He is alert and oriented to person, place, and time.     Motor: No weakness.  Psychiatric:        Mood and Affect: Mood normal.        Behavior: Behavior normal.        Thought Content: Thought content normal.      BP 122/78 (BP Location: Left Arm, Patient Position: Sitting, Cuff Size: Normal)    Pulse 81   Temp 97.8 F (36.6 C) (Temporal)   Ht 5\' 9"  (1.753 m)   Wt 176 lb (79.8 kg)   SpO2 97%   BMI 25.99 kg/m  Wt Readings from Last 3 Encounters:  06/15/23 176 lb (79.8 kg)  06/07/23 178 lb (80.7 kg)  05/03/23 178 lb (80.7 kg)       Assessment & Plan:   Problem List Items Addressed This Visit   None Visit Diagnoses     Mass of soft tissue of forearm    -  Primary   Relevant Orders   [place] LT UPPER EXTREM LTD SOFT TISSUE NON VASCULAR      US soft tissue ordered. Consider MR if needed for diagnosis   I am having Dr. Renae Fickle D. Gery Pray, MD maintain his montelukast, aspirin EC, Align, albuterol, dextromethorphan, amLODipine, ALPRAZolam, alfuzosin, rosuvastatin, pantoprazole, fluticasone, olmesartan, traMADol, and celecoxib.  No orders of the defined types were placed in this encounter.

## 2023-06-17 ENCOUNTER — Encounter: Payer: Self-pay | Admitting: Family Medicine

## 2023-06-17 ENCOUNTER — Encounter: Payer: Self-pay | Admitting: Internal Medicine

## 2023-06-17 ENCOUNTER — Ambulatory Visit
Admission: RE | Admit: 2023-06-17 | Discharge: 2023-06-17 | Disposition: A | Discharge status: Home / Self Care | Payer: Medicare Other | Source: Ambulatory Visit | Attending: Family Medicine | Admitting: Family Medicine

## 2023-06-17 ENCOUNTER — Other Ambulatory Visit: Payer: Self-pay | Admitting: Family Medicine

## 2023-06-17 DIAGNOSIS — M7989 Other specified soft tissue disorders: Secondary | ICD-10-CM

## 2023-06-17 DIAGNOSIS — R936 Abnormal findings on diagnostic imaging of limbs: Secondary | ICD-10-CM

## 2023-06-20 ENCOUNTER — Ambulatory Visit
Admission: RE | Admit: 2023-06-20 | Discharge: 2023-06-20 | Disposition: A | Discharge status: Home / Self Care | Payer: Medicare Other | Source: Ambulatory Visit | Attending: Family Medicine | Admitting: Family Medicine

## 2023-06-20 DIAGNOSIS — M7989 Other specified soft tissue disorders: Secondary | ICD-10-CM | POA: Diagnosis not present

## 2023-06-20 DIAGNOSIS — R936 Abnormal findings on diagnostic imaging of limbs: Secondary | ICD-10-CM | POA: Diagnosis not present

## 2023-06-20 MED ORDER — GADOPICLENOL 0.5 MMOL/ML IV SOLN
8.0000 mL | Freq: Once | INTRAVENOUS | Status: AC | PRN
  Administered 2023-06-20: 8 mL via INTRAVENOUS

## 2023-06-21 ENCOUNTER — Encounter: Payer: Self-pay | Admitting: Family Medicine

## 2023-06-21 NOTE — Telephone Encounter (Signed)
Please advise 

## 2023-06-22 ENCOUNTER — Encounter: Payer: Self-pay | Admitting: Family Medicine

## 2023-06-22 ENCOUNTER — Other Ambulatory Visit: Payer: Self-pay | Admitting: Family Medicine

## 2023-06-22 MED ORDER — METHYLPREDNISOLONE 4 MG PO TBPK: ORAL_TABLET | ORAL | 0 refills | Status: DC

## 2023-06-22 NOTE — Telephone Encounter (Signed)
Requesting steroid dose pack just in case

## 2023-06-24 ENCOUNTER — Encounter: Payer: Self-pay | Admitting: Internal Medicine

## 2023-06-24 ENCOUNTER — Other Ambulatory Visit: Payer: Self-pay | Admitting: Internal Medicine

## 2023-06-24 DIAGNOSIS — I10 Essential (primary) hypertension: Secondary | ICD-10-CM

## 2023-06-29 ENCOUNTER — Other Ambulatory Visit: Payer: Self-pay | Admitting: Internal Medicine

## 2023-06-29 DIAGNOSIS — I10 Essential (primary) hypertension: Secondary | ICD-10-CM

## 2023-06-29 MED ORDER — INDAPAMIDE 1.25 MG PO TABS
1.2500 mg | ORAL_TABLET | Freq: Every day | ORAL | 0 refills | Status: DC
Start: 2023-06-29 — End: 2023-10-02

## 2023-07-09 DIAGNOSIS — M47812 Spondylosis without myelopathy or radiculopathy, cervical region: Secondary | ICD-10-CM | POA: Diagnosis not present

## 2023-07-09 DIAGNOSIS — M6281 Muscle weakness (generalized): Secondary | ICD-10-CM | POA: Diagnosis not present

## 2023-07-09 DIAGNOSIS — M542 Cervicalgia: Secondary | ICD-10-CM | POA: Diagnosis not present

## 2023-07-09 DIAGNOSIS — R269 Unspecified abnormalities of gait and mobility: Secondary | ICD-10-CM | POA: Diagnosis not present

## 2023-07-13 DIAGNOSIS — R269 Unspecified abnormalities of gait and mobility: Secondary | ICD-10-CM | POA: Diagnosis not present

## 2023-07-13 DIAGNOSIS — M47812 Spondylosis without myelopathy or radiculopathy, cervical region: Secondary | ICD-10-CM | POA: Diagnosis not present

## 2023-07-13 DIAGNOSIS — M6281 Muscle weakness (generalized): Secondary | ICD-10-CM | POA: Diagnosis not present

## 2023-07-13 DIAGNOSIS — M542 Cervicalgia: Secondary | ICD-10-CM | POA: Diagnosis not present

## 2023-07-20 DIAGNOSIS — M6281 Muscle weakness (generalized): Secondary | ICD-10-CM | POA: Diagnosis not present

## 2023-07-20 DIAGNOSIS — M47812 Spondylosis without myelopathy or radiculopathy, cervical region: Secondary | ICD-10-CM | POA: Diagnosis not present

## 2023-07-20 DIAGNOSIS — M542 Cervicalgia: Secondary | ICD-10-CM | POA: Diagnosis not present

## 2023-07-20 DIAGNOSIS — R269 Unspecified abnormalities of gait and mobility: Secondary | ICD-10-CM | POA: Diagnosis not present

## 2023-07-21 DIAGNOSIS — L720 Epidermal cyst: Secondary | ICD-10-CM | POA: Diagnosis not present

## 2023-07-21 DIAGNOSIS — L905 Scar conditions and fibrosis of skin: Secondary | ICD-10-CM | POA: Diagnosis not present

## 2023-07-21 DIAGNOSIS — L111 Transient acantholytic dermatosis [Grover]: Secondary | ICD-10-CM | POA: Diagnosis not present

## 2023-07-21 DIAGNOSIS — Z85828 Personal history of other malignant neoplasm of skin: Secondary | ICD-10-CM | POA: Diagnosis not present

## 2023-07-21 DIAGNOSIS — Z85820 Personal history of malignant melanoma of skin: Secondary | ICD-10-CM | POA: Diagnosis not present

## 2023-07-21 DIAGNOSIS — L821 Other seborrheic keratosis: Secondary | ICD-10-CM | POA: Diagnosis not present

## 2023-07-21 DIAGNOSIS — L570 Actinic keratosis: Secondary | ICD-10-CM | POA: Diagnosis not present

## 2023-07-21 DIAGNOSIS — D692 Other nonthrombocytopenic purpura: Secondary | ICD-10-CM | POA: Diagnosis not present

## 2023-07-26 ENCOUNTER — Other Ambulatory Visit: Payer: Self-pay | Admitting: Internal Medicine

## 2023-07-26 DIAGNOSIS — I10 Essential (primary) hypertension: Secondary | ICD-10-CM

## 2023-07-26 DIAGNOSIS — I2510 Atherosclerotic heart disease of native coronary artery without angina pectoris: Secondary | ICD-10-CM

## 2023-07-26 MED ORDER — AMLODIPINE BESYLATE 5 MG PO TABS
5.0000 mg | ORAL_TABLET | Freq: Every day | ORAL | 0 refills | Status: DC
Start: 2023-07-26 — End: 2023-10-10

## 2023-07-28 DIAGNOSIS — M542 Cervicalgia: Secondary | ICD-10-CM | POA: Diagnosis not present

## 2023-07-28 DIAGNOSIS — M6281 Muscle weakness (generalized): Secondary | ICD-10-CM | POA: Diagnosis not present

## 2023-07-28 DIAGNOSIS — M47812 Spondylosis without myelopathy or radiculopathy, cervical region: Secondary | ICD-10-CM | POA: Diagnosis not present

## 2023-07-28 DIAGNOSIS — R269 Unspecified abnormalities of gait and mobility: Secondary | ICD-10-CM | POA: Diagnosis not present

## 2023-08-05 DIAGNOSIS — Z23 Encounter for immunization: Secondary | ICD-10-CM | POA: Diagnosis not present

## 2023-08-08 ENCOUNTER — Other Ambulatory Visit: Payer: Self-pay | Admitting: Internal Medicine

## 2023-08-10 DIAGNOSIS — Z23 Encounter for immunization: Secondary | ICD-10-CM | POA: Diagnosis not present

## 2023-08-11 ENCOUNTER — Other Ambulatory Visit: Payer: Self-pay | Admitting: Internal Medicine

## 2023-08-11 DIAGNOSIS — M542 Cervicalgia: Secondary | ICD-10-CM | POA: Diagnosis not present

## 2023-08-11 DIAGNOSIS — M47812 Spondylosis without myelopathy or radiculopathy, cervical region: Secondary | ICD-10-CM | POA: Diagnosis not present

## 2023-08-11 DIAGNOSIS — M65341 Trigger finger, right ring finger: Secondary | ICD-10-CM | POA: Diagnosis not present

## 2023-08-11 DIAGNOSIS — M6281 Muscle weakness (generalized): Secondary | ICD-10-CM | POA: Diagnosis not present

## 2023-08-11 DIAGNOSIS — M65822 Other synovitis and tenosynovitis, left upper arm: Secondary | ICD-10-CM | POA: Diagnosis not present

## 2023-08-11 DIAGNOSIS — M1180 Other specified crystal arthropathies, unspecified site: Secondary | ICD-10-CM | POA: Diagnosis not present

## 2023-08-11 DIAGNOSIS — M25522 Pain in left elbow: Secondary | ICD-10-CM | POA: Diagnosis not present

## 2023-08-11 DIAGNOSIS — R269 Unspecified abnormalities of gait and mobility: Secondary | ICD-10-CM | POA: Diagnosis not present

## 2023-08-11 DIAGNOSIS — Z4789 Encounter for other orthopedic aftercare: Secondary | ICD-10-CM | POA: Diagnosis not present

## 2023-08-12 DIAGNOSIS — M1120 Other chondrocalcinosis, unspecified site: Secondary | ICD-10-CM | POA: Insufficient documentation

## 2023-08-12 DIAGNOSIS — M7520 Bicipital tendinitis, unspecified shoulder: Secondary | ICD-10-CM | POA: Insufficient documentation

## 2023-08-20 ENCOUNTER — Other Ambulatory Visit: Payer: Self-pay | Admitting: Internal Medicine

## 2023-08-20 DIAGNOSIS — M1712 Unilateral primary osteoarthritis, left knee: Secondary | ICD-10-CM

## 2023-08-20 DIAGNOSIS — M4716 Other spondylosis with myelopathy, lumbar region: Secondary | ICD-10-CM

## 2023-08-20 DIAGNOSIS — M1189 Other specified crystal arthropathies, multiple sites: Secondary | ICD-10-CM

## 2023-08-20 DIAGNOSIS — M150 Primary generalized (osteo)arthritis: Secondary | ICD-10-CM

## 2023-08-20 DIAGNOSIS — I10 Essential (primary) hypertension: Secondary | ICD-10-CM

## 2023-08-20 DIAGNOSIS — M48062 Spinal stenosis, lumbar region with neurogenic claudication: Secondary | ICD-10-CM

## 2023-08-20 DIAGNOSIS — M1711 Unilateral primary osteoarthritis, right knee: Secondary | ICD-10-CM

## 2023-08-20 DIAGNOSIS — M51369 Other intervertebral disc degeneration, lumbar region without mention of lumbar back pain or lower extremity pain: Secondary | ICD-10-CM

## 2023-08-20 DIAGNOSIS — M5020 Other cervical disc displacement, unspecified cervical region: Secondary | ICD-10-CM

## 2023-08-21 ENCOUNTER — Other Ambulatory Visit: Payer: Self-pay | Admitting: Internal Medicine

## 2023-08-21 DIAGNOSIS — M150 Primary generalized (osteo)arthritis: Secondary | ICD-10-CM

## 2023-08-21 DIAGNOSIS — M5020 Other cervical disc displacement, unspecified cervical region: Secondary | ICD-10-CM

## 2023-08-21 DIAGNOSIS — I10 Essential (primary) hypertension: Secondary | ICD-10-CM

## 2023-08-21 DIAGNOSIS — M48062 Spinal stenosis, lumbar region with neurogenic claudication: Secondary | ICD-10-CM

## 2023-08-21 DIAGNOSIS — M1712 Unilateral primary osteoarthritis, left knee: Secondary | ICD-10-CM

## 2023-08-21 DIAGNOSIS — M1189 Other specified crystal arthropathies, multiple sites: Secondary | ICD-10-CM

## 2023-08-21 DIAGNOSIS — M4716 Other spondylosis with myelopathy, lumbar region: Secondary | ICD-10-CM

## 2023-08-21 DIAGNOSIS — M1711 Unilateral primary osteoarthritis, right knee: Secondary | ICD-10-CM

## 2023-08-21 DIAGNOSIS — M5136 Other intervertebral disc degeneration, lumbar region with discogenic back pain only: Secondary | ICD-10-CM

## 2023-08-21 MED ORDER — OLMESARTAN MEDOXOMIL 20 MG PO TABS: 20.0000 mg | ORAL_TABLET | Freq: Every day | ORAL | 0 refills | Status: DC

## 2023-08-21 MED ORDER — TRAMADOL HCL 50 MG PO TABS: 50.0000 mg | ORAL_TABLET | Freq: Two times a day (BID) | ORAL | 0 refills | Status: DC | PRN

## 2023-09-02 ENCOUNTER — Other Ambulatory Visit: Payer: Self-pay | Admitting: Internal Medicine

## 2023-09-02 DIAGNOSIS — M51369 Other intervertebral disc degeneration, lumbar region without mention of lumbar back pain or lower extremity pain: Secondary | ICD-10-CM

## 2023-09-02 DIAGNOSIS — M4716 Other spondylosis with myelopathy, lumbar region: Secondary | ICD-10-CM

## 2023-09-02 DIAGNOSIS — M1712 Unilateral primary osteoarthritis, left knee: Secondary | ICD-10-CM

## 2023-09-02 DIAGNOSIS — M5020 Other cervical disc displacement, unspecified cervical region: Secondary | ICD-10-CM

## 2023-09-02 DIAGNOSIS — M479 Spondylosis, unspecified: Secondary | ICD-10-CM

## 2023-09-02 DIAGNOSIS — M1189 Other specified crystal arthropathies, multiple sites: Secondary | ICD-10-CM

## 2023-09-02 DIAGNOSIS — M5136 Other intervertebral disc degeneration, lumbar region with discogenic back pain only: Secondary | ICD-10-CM

## 2023-09-02 DIAGNOSIS — M150 Primary generalized (osteo)arthritis: Secondary | ICD-10-CM

## 2023-09-02 DIAGNOSIS — M1711 Unilateral primary osteoarthritis, right knee: Secondary | ICD-10-CM

## 2023-09-02 DIAGNOSIS — M48062 Spinal stenosis, lumbar region with neurogenic claudication: Secondary | ICD-10-CM

## 2023-09-03 ENCOUNTER — Other Ambulatory Visit: Payer: Self-pay | Admitting: Internal Medicine

## 2023-09-03 DIAGNOSIS — M48062 Spinal stenosis, lumbar region with neurogenic claudication: Secondary | ICD-10-CM

## 2023-09-03 DIAGNOSIS — M1189 Other specified crystal arthropathies, multiple sites: Secondary | ICD-10-CM

## 2023-09-03 DIAGNOSIS — M5136 Other intervertebral disc degeneration, lumbar region with discogenic back pain only: Secondary | ICD-10-CM

## 2023-09-03 DIAGNOSIS — G629 Polyneuropathy, unspecified: Secondary | ICD-10-CM

## 2023-09-03 DIAGNOSIS — M170 Bilateral primary osteoarthritis of knee: Secondary | ICD-10-CM

## 2023-09-03 DIAGNOSIS — M1712 Unilateral primary osteoarthritis, left knee: Secondary | ICD-10-CM

## 2023-09-03 DIAGNOSIS — M4716 Other spondylosis with myelopathy, lumbar region: Secondary | ICD-10-CM

## 2023-09-03 DIAGNOSIS — M150 Primary generalized (osteo)arthritis: Secondary | ICD-10-CM

## 2023-09-03 DIAGNOSIS — M479 Spondylosis, unspecified: Secondary | ICD-10-CM

## 2023-09-03 DIAGNOSIS — M5020 Other cervical disc displacement, unspecified cervical region: Secondary | ICD-10-CM

## 2023-09-03 DIAGNOSIS — M1711 Unilateral primary osteoarthritis, right knee: Secondary | ICD-10-CM

## 2023-09-03 MED ORDER — CELECOXIB 50 MG PO CAPS: 50.0000 mg | ORAL_CAPSULE | Freq: Two times a day (BID) | ORAL | 0 refills | Status: DC

## 2023-09-03 MED ORDER — TRAMADOL HCL 50 MG PO TABS: 50.0000 mg | ORAL_TABLET | Freq: Two times a day (BID) | ORAL | 0 refills | Status: DC | PRN

## 2023-09-26 ENCOUNTER — Encounter: Payer: Self-pay | Admitting: Cardiology

## 2023-09-26 DIAGNOSIS — R002 Palpitations: Secondary | ICD-10-CM

## 2023-09-27 ENCOUNTER — Encounter: Payer: Self-pay | Admitting: Cardiology

## 2023-09-29 ENCOUNTER — Ambulatory Visit: Payer: Medicare Other | Attending: Cardiology

## 2023-09-29 DIAGNOSIS — R002 Palpitations: Secondary | ICD-10-CM

## 2023-09-29 NOTE — Progress Notes (Unsigned)
Enrolled patient for a 14 day [employer] Long Term monitor ?

## 2023-10-02 ENCOUNTER — Other Ambulatory Visit: Payer: Self-pay | Admitting: Internal Medicine

## 2023-10-02 DIAGNOSIS — I10 Essential (primary) hypertension: Secondary | ICD-10-CM

## 2023-10-03 DIAGNOSIS — R002 Palpitations: Secondary | ICD-10-CM

## 2023-10-10 ENCOUNTER — Other Ambulatory Visit: Payer: Self-pay | Admitting: Internal Medicine

## 2023-10-10 DIAGNOSIS — I10 Essential (primary) hypertension: Secondary | ICD-10-CM

## 2023-10-10 DIAGNOSIS — I2510 Atherosclerotic heart disease of native coronary artery without angina pectoris: Secondary | ICD-10-CM

## 2023-10-11 MED ORDER — AMLODIPINE BESYLATE 5 MG PO TABS: 5.0000 mg | ORAL_TABLET | Freq: Every day | ORAL | 0 refills | Status: DC

## 2023-10-13 ENCOUNTER — Other Ambulatory Visit: Payer: Self-pay | Admitting: Internal Medicine

## 2023-10-13 DIAGNOSIS — K219 Gastro-esophageal reflux disease without esophagitis: Secondary | ICD-10-CM

## 2023-10-26 ENCOUNTER — Encounter: Payer: Self-pay | Admitting: Cardiology

## 2023-10-27 DIAGNOSIS — I499 Cardiac arrhythmia, unspecified: Secondary | ICD-10-CM

## 2023-10-27 HISTORY — DX: Cardiac arrhythmia, unspecified: I49.9

## 2023-10-29 DIAGNOSIS — M47812 Spondylosis without myelopathy or radiculopathy, cervical region: Secondary | ICD-10-CM | POA: Diagnosis not present

## 2023-10-29 DIAGNOSIS — R269 Unspecified abnormalities of gait and mobility: Secondary | ICD-10-CM | POA: Diagnosis not present

## 2023-10-29 DIAGNOSIS — M542 Cervicalgia: Secondary | ICD-10-CM | POA: Diagnosis not present

## 2023-10-29 DIAGNOSIS — M6281 Muscle weakness (generalized): Secondary | ICD-10-CM | POA: Diagnosis not present

## 2023-11-03 ENCOUNTER — Encounter: Payer: Self-pay | Admitting: Internal Medicine

## 2023-11-03 ENCOUNTER — Ambulatory Visit: Payer: Medicare Other | Admitting: Internal Medicine

## 2023-11-03 ENCOUNTER — Other Ambulatory Visit: Payer: Self-pay | Admitting: Internal Medicine

## 2023-11-03 VITALS — BP 142/70 | HR 97 | Temp 98.4°F | Resp 16 | Ht 69.0 in | Wt 176.6 lb

## 2023-11-03 DIAGNOSIS — R3912 Poor urinary stream: Secondary | ICD-10-CM

## 2023-11-03 DIAGNOSIS — M1711 Unilateral primary osteoarthritis, right knee: Secondary | ICD-10-CM

## 2023-11-03 DIAGNOSIS — I10 Essential (primary) hypertension: Secondary | ICD-10-CM

## 2023-11-03 DIAGNOSIS — F411 Generalized anxiety disorder: Secondary | ICD-10-CM | POA: Diagnosis not present

## 2023-11-03 DIAGNOSIS — N401 Enlarged prostate with lower urinary tract symptoms: Secondary | ICD-10-CM | POA: Diagnosis not present

## 2023-11-03 DIAGNOSIS — M4716 Other spondylosis with myelopathy, lumbar region: Secondary | ICD-10-CM | POA: Diagnosis not present

## 2023-11-03 DIAGNOSIS — N1831 Chronic kidney disease, stage 3a: Secondary | ICD-10-CM

## 2023-11-03 DIAGNOSIS — F321 Major depressive disorder, single episode, moderate: Secondary | ICD-10-CM | POA: Diagnosis not present

## 2023-11-03 DIAGNOSIS — I2510 Atherosclerotic heart disease of native coronary artery without angina pectoris: Secondary | ICD-10-CM | POA: Diagnosis not present

## 2023-11-03 DIAGNOSIS — R0609 Other forms of dyspnea: Secondary | ICD-10-CM | POA: Diagnosis not present

## 2023-11-03 LAB — BASIC METABOLIC PANEL
BUN: 22 mg/dL (ref 6–23)
CO2: 28 meq/L (ref 19–32)
Calcium: 9.8 mg/dL (ref 8.4–10.5)
Chloride: 100 meq/L (ref 96–112)
Creatinine, Ser: 1.19 mg/dL (ref 0.40–1.50)
GFR: 59.66 mL/min — ABNORMAL LOW (ref >60.00)
Glucose, Bld: 91 mg/dL (ref 70–99)
Potassium: 4.3 meq/L (ref 3.5–5.1)
Sodium: 138 meq/L (ref 135–145)

## 2023-11-03 LAB — CBC WITH DIFFERENTIAL/PLATELET
Basophils Absolute: 0.1 10*3/uL (ref 0.0–0.1)
Basophils Relative: 0.9 % (ref 0.0–3.0)
Eosinophils Absolute: 0.2 10*3/uL (ref 0.0–0.7)
Eosinophils Relative: 2.2 % (ref 0.0–5.0)
HCT: 44.4 % (ref 39.0–52.0)
Hemoglobin: 14.9 g/dL (ref 13.0–17.0)
Lymphocytes Relative: 12.6 % (ref 12.0–46.0)
Lymphs Abs: 1.3 10*3/uL (ref 0.7–4.0)
MCHC: 33.6 g/dL (ref 30.0–36.0)
MCV: 91.5 fL (ref 78.0–100.0)
Monocytes Absolute: 0.9 10*3/uL (ref 0.1–1.0)
Monocytes Relative: 9.2 % (ref 3.0–12.0)
Neutro Abs: 7.6 10*3/uL (ref 1.4–7.7)
Neutrophils Relative %: 75.1 % (ref 43.0–77.0)
Platelets: 287 10*3/uL (ref 150.0–400.0)
RBC: 4.86 Mil/uL (ref 4.22–5.81)
RDW: 12.7 % (ref 11.5–15.5)
WBC: 10.2 10*3/uL (ref 4.0–10.5)

## 2023-11-03 LAB — URINALYSIS, ROUTINE W REFLEX MICROSCOPIC
Bilirubin Urine: NEGATIVE
Hgb urine dipstick: NEGATIVE
Ketones, ur: NEGATIVE
Leukocytes,Ua: NEGATIVE
Nitrite: NEGATIVE
RBC / HPF: NONE SEEN (ref 0–?)
Specific Gravity, Urine: 1.015 (ref 1.000–1.030)
Total Protein, Urine: NEGATIVE
Urine Glucose: NEGATIVE
Urobilinogen, UA: 0.2 (ref 0.0–1.0)
pH: 6 (ref 5.0–8.0)

## 2023-11-03 LAB — BRAIN NATRIURETIC PEPTIDE: Pro B Natriuretic peptide (BNP): 27 pg/mL (ref 0.0–100.0)

## 2023-11-03 LAB — TROPONIN I (HIGH SENSITIVITY): High Sens Troponin I: 9 ng/L (ref 2–17)

## 2023-11-03 MED ORDER — DULOXETINE HCL 30 MG PO CPEP: 30.0000 mg | ORAL_CAPSULE | Freq: Every day | ORAL | 0 refills | Status: DC

## 2023-11-03 MED ORDER — ALFUZOSIN HCL ER 10 MG PO TB24: 10.0000 mg | ORAL_TABLET | Freq: Every evening | ORAL | 0 refills | Status: DC

## 2023-11-03 NOTE — Progress Notes (Signed)
 Subjective:  Patient ID: Deward JONETTA [name], MD, male    DOB: Jan 07, 1946  Age: 78 y.o. MRN: [id number]  CC: Hypertension and Coronary Artery Disease   HPI Deward JONETTA [name], MD presents for f/up ---  Discussed the use of AI scribe software for clinical note transcription with the patient, who gave verbal consent to proceed.  History of Present Illness   The patient, with a history of CIDP and MSK issues, recently wore a cardiac monitor for two weeks due to concerns of super ventricular arrhythmias. The results are pending review by his cardiologist. He also reports ongoing neurological symptoms, specifically C3 distribution issues from nerve encroachment, despite an increased dosage of tramadol .  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 He was advised that this mass is likely to tear, causing significant pain.  The patient also reports a persistent loss of taste, six months post-COVID. He expresses feelings of frustration and anger due to his health issues and limitations, which have significantly impacted his lifestyle and hobbies, including golf. He is actively seeking ways to give back to the community but is limited by his physical capabilities.  He denies chest pain and shortness of breath but does report occasional palpitations. He has no recent symptoms of dizziness, lightheadedness, headache, blurred vision, or edema.  He denies painful or bloody urination.  The patient also mentions peripheral neuropathy, which he attributes to long-term colchicine  use. He expresses concern about the sexual side effects of his current medications, including difficulty achieving an erection due to his urinary medication. He is considering starting an antidepressant, specifically duloxetine , for chronic pain management.       Outpatient Medications Prior to Visit  Medication Sig Dispense Refill   albuterol  (VENTOLIN  HFA) 108 (90 Base)  MCG/ACT inhaler Inhale 2 puffs into the lungs every 6 (six) hours as needed for wheezing or shortness of breath. 8 g 2   ALPRAZolam  (XANAX ) 0.5 MG tablet Take 1 tablet (0.5 mg total) by mouth 2 (two) times daily as needed for anxiety. 180 tablet 0   amLODipine  (NORVASC ) 5 MG tablet Take 1 tablet (5 mg total) by mouth daily. 90 tablet 0   aspirin  EC 81 MG tablet Take 81 mg by mouth at bedtime. Swallow whole.     celecoxib  (CELEBREX ) 50 MG capsule Take 1 capsule (50 mg total) by mouth 2 (two) times daily. 180 capsule 0   dextromethorphan  (DELSYM ) 30 MG/5ML liquid Take 5 mLs (30 mg total) by mouth 2 (two) times daily. 148 mL 0   fluticasone  (CUTIVATE ) 0.05 % cream APPLY TO AFFECTED AREA TWICE A DAY 30 g 1   indapamide  (LOZOL ) 1.25 MG tablet TAKE 1 TABLET BY MOUTH DAILY. 90 tablet 0   montelukast  (SINGULAIR ) 10 MG tablet TAKE 1 TABLET BY MOUTH EVERYDAY AT BEDTIME (Patient taking differently: Take 10 mg by mouth at bedtime.) 90 tablet 0   olmesartan  (BENICAR ) 20 MG tablet Take 1 tablet (20 mg total) by mouth daily. 90 tablet 0   pantoprazole  (PROTONIX ) 40 MG tablet TAKE 1 TABLET 1 OR 2 TIMES DAILY 30 MINUTES BEFORE MEALS 180 tablet 0   Probiotic Product (ALIGN) 4 MG CAPS Take 4 mg by mouth in the morning.     rosuvastatin  (CRESTOR ) 10 MG tablet TAKE 1 TABLET BY MOUTH EVERYDAY AT BEDTIME 90 tablet 1   traMADol  (ULTRAM ) 50 MG tablet Take 1 tablet (50 mg total) by mouth every 12 (twelve)  hours as needed. 180 tablet 0   alfuzosin  (UROXATRAL ) 10 MG 24 hr tablet TAKE 1 TABLET BY MOUTH EVERY DAY IN THE EVENING 90 tablet 0   No facility-administered medications prior to visit.    ROS Review of Systems  Constitutional: Negative.  Negative for appetite change, fatigue and unexpected weight change.  HENT: Negative.  Negative for trouble swallowing.   Respiratory: Negative.  Negative for cough, chest tightness, shortness of breath and wheezing.   Cardiovascular:  Negative for chest pain, palpitations and leg  swelling.  Gastrointestinal:  Negative for abdominal pain, constipation, diarrhea, nausea and vomiting.  Endocrine: Negative.   Genitourinary:  Positive for difficulty urinating. Negative for dysuria, frequency, hematuria and urgency.  Musculoskeletal:  Positive for arthralgias, back pain and neck pain. Negative for gait problem and myalgias.  Skin: Negative.  Negative for color change.  Neurological: Negative.  Negative for dizziness and weakness.  Hematological:  Negative for adenopathy. Does not bruise/bleed easily.  Psychiatric/Behavioral:  Positive for dysphoric mood. Negative for behavioral problems, confusion, decreased concentration, self-injury, sleep disturbance and suicidal ideas. The patient is not nervous/anxious.     Objective:  BP (!) 142/70 (BP Location: Left Arm, Patient Position: Sitting, Cuff Size: Normal)   Pulse 97   Temp 98.4 F (36.9 C) (Oral)   Resp 16   Ht 5' 9 (1.753 m)   Wt 176 lb 9.6 oz (80.1 kg)   SpO2 96%   BMI 26.08 kg/m   BP Readings from Last 3 Encounters:  11/03/23 (!) 142/70  06/15/23 122/78  06/07/23 112/70    Wt Readings from Last 3 Encounters:  11/03/23 176 lb 9.6 oz (80.1 kg)  06/15/23 176 lb (79.8 kg)  06/07/23 178 lb (80.7 kg)    Physical Exam Vitals reviewed.  Constitutional:      Appearance: Normal appearance.  HENT:     Mouth/Throat:     Mouth: Mucous membranes are moist.  Eyes:     General: No scleral icterus.    Conjunctiva/sclera: Conjunctivae normal.  Cardiovascular:     Rate and Rhythm: Normal rate and regular rhythm.     Heart sounds: Normal heart sounds, S1 normal and S2 normal. No murmur heard.    Comments: EKG- NSR, 80 bpm Flat T wave in III is new [place] Q wave in III No LVH Pulmonary:     Effort: Pulmonary effort is normal.     Breath sounds: No stridor. No wheezing, rhonchi or rales.  Abdominal:     General: Abdomen is flat.     Palpations: There is no mass.     Tenderness: There is no abdominal  tenderness. There is no guarding.     Hernia: No hernia is present.  Musculoskeletal:     Cervical back: Neck supple.     Right lower leg: No edema.     Left lower leg: No edema.  Skin:    General: Skin is warm and dry.     Findings: No rash.  Neurological:     General: No focal deficit present.     Mental Status: He is alert. Mental status is at baseline.  Psychiatric:        Attention and Perception: Attention and perception normal.        Mood and Affect: Mood is anxious and depressed. Affect is not tearful.        Speech: Speech normal.        Behavior: Behavior normal. Behavior is cooperative.  Thought Content: Thought content normal. Thought content is not paranoid or delusional. Thought content does not include homicidal or suicidal ideation.        Cognition and Memory: Cognition normal.     Lab Results  Component Value Date   WBC 10.2 11/03/2023   HGB 14.9 11/03/2023   HCT 44.4 11/03/2023   PLT 287.0 11/03/2023   GLUCOSE 91 11/03/2023   CHOL 132 05/03/2023   TRIG 124.0 05/03/2023   HDL 48.90 05/03/2023   LDLDIRECT 151.6 04/10/2011   LDLCALC 58 05/03/2023   ALT 23 05/03/2023   AST 25 05/03/2023   NA 138 11/03/2023   K 4.3 11/03/2023   CL 100 11/03/2023   CREATININE 1.19 11/03/2023   BUN 22 11/03/2023   CO2 28 11/03/2023   TSH 1.61 05/03/2023   PSA 2.10 06/04/2023   INR 1.0 03/09/2017    MR FOREARM LEFT W WO CONTRAST Result Date: 06/20/2023 CLINICAL DATA:  Evaluate antecubital fossa mass. EXAM: MRI OF THE LEFT FOREARM WITHOUT AND WITH CONTRAST TECHNIQUE: Multiplanar, multisequence MR imaging of the left elbow/forearm was performed before and after the administration of intravenous contrast. CONTRAST:  8 cc Vueway  COMPARISON:  Ultrasound 06/17/2023 FINDINGS: The patient's palpable abnormality corresponds to a 2.9 x 1.5 x 1.5 cm complex cystic area located just below the antecubital fossa and adjacent to the biceps tendon. Fairly homogeneous low T1 signal  intensity and mildly heterogeneous increased T2 signal intensity. After contrast administration there is thick rim like enhancement. 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 This can be associated with CPPD arthropathy and other inflammatory processes. No worrisome MR imaging features. The biceps tendon and brachialis tendons are intact. Small elbow joint effusion and mild enhancing synovium. No loose bodies. No erosive changes. The forearm musculature is unremarkable.  No myositis or mass. IMPRESSION: 1. 2.9 x 1.5 x 1.5 cm complex cystic area located just below the antecubital fossa and adjacent to the biceps tendon. 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 This can be associated with CPPD arthropathy and other inflammatory processes. No worrisome MR imaging features. 2. Small elbow joint effusion and mild enhancing synovium. Electronically Signed   By: MYRTIS Stammer [name]   On: 06/20/2023 16:17    Assessment & Plan:   Stage 3a chronic kidney disease (HCC)- Will avoid nephrotoxic agents  -     Basic metabolic panel; Future -     CBC with Differential/Platelet; Future -     Urinalysis, Routine w reflex microscopic; Future  GAD (generalized anxiety disorder) -     DULoxetine  HCl; Take 1 capsule (30 mg total) by mouth daily.  Dispense: 30 capsule; Refill: 0  Current moderate episode of major depressive disorder without prior episode (HCC) -     DULoxetine  HCl; Take 1 capsule (30 mg total) by mouth daily.  Dispense: 30 capsule; Refill: 0  Lumbar spondylosis with myelopathy -     DULoxetine  HCl; Take 1 capsule (30 mg total) by mouth daily.  Dispense: 30 capsule; Refill: 0  Primary osteoarthritis of right knee -     DULoxetine  HCl; Take 1 capsule (30 mg total) by mouth daily.  Dispense: 30 capsule; Refill: 0  Coronary artery disease involving native coronary artery of native heart without angina pectoris -     Troponin I (High Sensitivity); Future -     Brain  natriuretic peptide; Future  Primary hypertension- BP is adequately well controlled. -     Basic metabolic panel; Future -  CBC with Differential/Platelet; Future -     Urinalysis, Routine w reflex microscopic; Future -     EKG 12-Lead  DOE (dyspnea on exertion)- EKG and labs are reassuring. -     Troponin I (High Sensitivity); Future -     Brain natriuretic peptide; Future  Benign prostatic hyperplasia with weak urinary stream -     Alfuzosin  HCl ER; Take 1 tablet (10 mg total) by mouth every evening.  Dispense: 90 tablet; Refill: 0     Follow-up: Return in about 3 months (around 02/01/2024).  Debby Molt, MD

## 2023-11-03 NOTE — Patient Instructions (Signed)
 Hypertension, Adult High blood pressure (hypertension) is when the force of blood pumping through the arteries is too strong. The arteries are the blood vessels that carry blood from the heart throughout the body. Hypertension forces the heart to work harder to pump blood and may cause arteries to become narrow or stiff. Untreated or uncontrolled hypertension can lead to a heart attack, heart failure, a stroke, kidney disease, and other problems. A blood pressure reading consists of a higher number over a lower number. Ideally, your blood pressure should be below 120/80. The first ("top") number is called the systolic pressure. It is a measure of the pressure in your arteries as your heart beats. The second ("bottom") number is called the diastolic pressure. It is a measure of the pressure in your arteries as the heart relaxes. What are the causes? The exact cause of this condition is not known. There are some conditions that result in high blood pressure. What increases the risk? Certain factors may make you more likely to develop high blood pressure. Some of these risk factors are under your control, including: Smoking. Not getting enough exercise or physical activity. Being overweight. Having too much fat, sugar, calories, or salt (sodium) in your diet. Drinking too much alcohol. Other risk factors include: Having a personal history of heart disease, diabetes, high cholesterol, or kidney disease. Stress. Having a family history of high blood pressure and high cholesterol. Having obstructive sleep apnea. Age. The risk increases with age. What are the signs or symptoms? High blood pressure may not cause symptoms. Very high blood pressure (hypertensive crisis) may cause: Headache. Fast or irregular heartbeats (palpitations). Shortness of breath. Nosebleed. Nausea and vomiting. Vision changes. Severe chest pain, dizziness, and seizures. How is this diagnosed? This condition is diagnosed by  measuring your blood pressure while you are seated, with your arm resting on a flat surface, your legs uncrossed, and your feet flat on the floor. The cuff of the blood pressure monitor will be placed directly against the skin of your upper arm at the level of your heart. Blood pressure should be measured at least twice using the same arm. Certain conditions can cause a difference in blood pressure between your right and left arms. If you have a high blood pressure reading during one visit or you have normal blood pressure with other risk factors, you may be asked to: Return on a different day to have your blood pressure checked again. Monitor your blood pressure at home for 1 week or longer. If you are diagnosed with hypertension, you may have other blood or imaging tests to help your health care provider understand your overall risk for other conditions. How is this treated? This condition is treated by making healthy lifestyle changes, such as eating healthy foods, exercising more, and reducing your alcohol intake. You may be referred for counseling on a healthy diet and physical activity. Your health care provider may prescribe medicine if lifestyle changes are not enough to get your blood pressure under control and if: Your systolic blood pressure is above 130. Your diastolic blood pressure is above 80. Your personal target blood pressure may vary depending on your medical conditions, your age, and other factors. Follow these instructions at home: Eating and drinking  Eat a diet that is high in fiber and potassium, and low in sodium, added sugar, and fat. An example of this eating plan is called the DASH diet. DASH stands for Dietary Approaches to Stop Hypertension. To eat this way: Eat  plenty of fresh fruits and vegetables. Try to fill one half of your plate at each meal with fruits and vegetables. Eat whole grains, such as whole-wheat pasta, brown rice, or whole-grain bread. Fill about one  fourth of your plate with whole grains. Eat or drink low-fat dairy products, such as skim milk or low-fat yogurt. Avoid fatty cuts of meat, processed or cured meats, and poultry with skin. Fill about one fourth of your plate with lean proteins, such as fish, chicken without skin, beans, eggs, or tofu. Avoid pre-made and processed foods. These tend to be higher in sodium, added sugar, and fat. Reduce your daily sodium intake. Many people with hypertension should eat less than 1,500 mg of sodium a day. Do not drink alcohol if: Your health care provider tells you not to drink. You are pregnant, may be pregnant, or are planning to become pregnant. If you drink alcohol: Limit how much you have to: 0-1 drink a day for women. 0-2 drinks a day for men. Know how much alcohol is in your drink. In the [place] one drink equals one 12 oz bottle of beer (355 mL), one 5 oz glass of wine (148 mL), or one 1 oz glass of hard liquor (44 mL). Lifestyle  Work with your health care provider to maintain a healthy body weight or to lose weight. Ask what an ideal weight is for you. Get at least 30 minutes of exercise that causes your heart to beat faster (aerobic exercise) most days of the week. Activities may include walking, swimming, or biking. Include exercise to strengthen your muscles (resistance exercise), such as Pilates or lifting weights, as part of your weekly exercise routine. Try to do these types of exercises for 30 minutes at least 3 days a week. Do not use any products that contain nicotine or tobacco. These products include cigarettes, chewing tobacco, and vaping devices, such as e-cigarettes. If you need help quitting, ask your health care provider. Monitor your blood pressure at home as told by your health care provider. Keep all follow-up visits. This is important. Medicines Take over-the-counter and prescription medicines only as told by your health care provider. Follow directions carefully. Blood  pressure medicines must be taken as prescribed. Do not skip doses of blood pressure medicine. Doing this puts you at risk for problems and can make the medicine less effective. Ask your health care provider about side effects or reactions to medicines that you should watch for. Contact a health care provider if you: Think you are having a reaction to a medicine you are taking. Have headaches that keep coming back (recurring). Feel dizzy. Have swelling in your ankles. Have trouble with your vision. Get help right away if you: Develop a severe headache or confusion. Have unusual weakness or numbness. Feel faint. Have severe pain in your chest or abdomen. Vomit repeatedly. Have trouble breathing. These symptoms may be an emergency. Get help right away. Call 911. Do not wait to see if the symptoms will go away. Do not drive yourself to the hospital. Summary Hypertension is when the force of blood pumping through your arteries is too strong. If this condition is not controlled, it may put you at risk for serious complications. Your personal target blood pressure may vary depending on your medical conditions, your age, and other factors. For most people, a normal blood pressure is less than 120/80. Hypertension is treated with lifestyle changes, medicines, or a combination of both. Lifestyle changes include losing weight, eating a healthy,  low-sodium diet, exercising more, and limiting alcohol. This information is not intended to replace advice given to you by your health care provider. Make sure you discuss any questions you have with your health care provider. Document Revised: 08/19/2021 Document Reviewed: 08/19/2021 Elsevier Patient Education  2024 [employer].

## 2023-11-04 ENCOUNTER — Encounter: Payer: Self-pay | Admitting: Cardiology

## 2023-11-04 ENCOUNTER — Encounter: Payer: Self-pay | Admitting: Internal Medicine

## 2023-11-05 DIAGNOSIS — R269 Unspecified abnormalities of gait and mobility: Secondary | ICD-10-CM | POA: Diagnosis not present

## 2023-11-05 DIAGNOSIS — M47812 Spondylosis without myelopathy or radiculopathy, cervical region: Secondary | ICD-10-CM | POA: Diagnosis not present

## 2023-11-05 DIAGNOSIS — M6281 Muscle weakness (generalized): Secondary | ICD-10-CM | POA: Diagnosis not present

## 2023-11-05 DIAGNOSIS — M542 Cervicalgia: Secondary | ICD-10-CM | POA: Diagnosis not present

## 2023-11-09 ENCOUNTER — Encounter: Payer: Self-pay | Admitting: Cardiology

## 2023-11-12 DIAGNOSIS — M542 Cervicalgia: Secondary | ICD-10-CM | POA: Diagnosis not present

## 2023-11-12 DIAGNOSIS — M6281 Muscle weakness (generalized): Secondary | ICD-10-CM | POA: Diagnosis not present

## 2023-11-12 DIAGNOSIS — M47812 Spondylosis without myelopathy or radiculopathy, cervical region: Secondary | ICD-10-CM | POA: Diagnosis not present

## 2023-11-12 DIAGNOSIS — R269 Unspecified abnormalities of gait and mobility: Secondary | ICD-10-CM | POA: Diagnosis not present

## 2023-11-14 ENCOUNTER — Encounter: Payer: Self-pay | Admitting: Internal Medicine

## 2023-11-19 ENCOUNTER — Other Ambulatory Visit: Payer: Self-pay | Admitting: Internal Medicine

## 2023-11-19 DIAGNOSIS — E785 Hyperlipidemia, unspecified: Secondary | ICD-10-CM

## 2023-11-25 ENCOUNTER — Other Ambulatory Visit: Payer: Self-pay | Admitting: Internal Medicine

## 2023-11-25 DIAGNOSIS — M1711 Unilateral primary osteoarthritis, right knee: Secondary | ICD-10-CM

## 2023-11-25 DIAGNOSIS — F321 Major depressive disorder, single episode, moderate: Secondary | ICD-10-CM

## 2023-11-25 DIAGNOSIS — M4716 Other spondylosis with myelopathy, lumbar region: Secondary | ICD-10-CM

## 2023-11-25 DIAGNOSIS — F411 Generalized anxiety disorder: Secondary | ICD-10-CM

## 2023-11-28 ENCOUNTER — Encounter: Payer: Self-pay | Admitting: Internal Medicine

## 2023-11-28 ENCOUNTER — Other Ambulatory Visit: Payer: Self-pay | Admitting: Internal Medicine

## 2023-11-28 DIAGNOSIS — M5020 Other cervical disc displacement, unspecified cervical region: Secondary | ICD-10-CM

## 2023-11-28 DIAGNOSIS — M1711 Unilateral primary osteoarthritis, right knee: Secondary | ICD-10-CM

## 2023-11-28 DIAGNOSIS — G629 Polyneuropathy, unspecified: Secondary | ICD-10-CM

## 2023-11-28 DIAGNOSIS — M1189 Other specified crystal arthropathies, multiple sites: Secondary | ICD-10-CM

## 2023-11-28 DIAGNOSIS — M1712 Unilateral primary osteoarthritis, left knee: Secondary | ICD-10-CM

## 2023-11-28 DIAGNOSIS — M4716 Other spondylosis with myelopathy, lumbar region: Secondary | ICD-10-CM

## 2023-11-28 DIAGNOSIS — M48062 Spinal stenosis, lumbar region with neurogenic claudication: Secondary | ICD-10-CM

## 2023-11-28 DIAGNOSIS — M150 Primary generalized (osteo)arthritis: Secondary | ICD-10-CM

## 2023-11-28 DIAGNOSIS — M479 Spondylosis, unspecified: Secondary | ICD-10-CM

## 2023-11-28 DIAGNOSIS — M5136 Other intervertebral disc degeneration, lumbar region with discogenic back pain only: Secondary | ICD-10-CM

## 2023-11-28 DIAGNOSIS — M170 Bilateral primary osteoarthritis of knee: Secondary | ICD-10-CM

## 2023-11-29 ENCOUNTER — Encounter: Payer: Self-pay | Admitting: Internal Medicine

## 2023-11-29 MED ORDER — CELECOXIB 50 MG PO CAPS: 50.0000 mg | ORAL_CAPSULE | Freq: Two times a day (BID) | ORAL | 0 refills | Status: DC

## 2023-11-29 MED ORDER — TRAMADOL HCL 50 MG PO TABS: 50.0000 mg | ORAL_TABLET | Freq: Two times a day (BID) | ORAL | 0 refills | Status: DC | PRN

## 2023-12-01 ENCOUNTER — Other Ambulatory Visit: Payer: Self-pay | Admitting: Internal Medicine

## 2023-12-01 DIAGNOSIS — I471 Supraventricular tachycardia, unspecified: Secondary | ICD-10-CM

## 2023-12-01 MED ORDER — AMLODIPINE BESYLATE 10 MG PO TABS: 10.0000 mg | ORAL_TABLET | Freq: Every day | ORAL | 0 refills | Status: DC

## 2023-12-03 ENCOUNTER — Other Ambulatory Visit: Payer: Self-pay | Admitting: Neurological Surgery

## 2023-12-03 ENCOUNTER — Encounter: Payer: Self-pay | Admitting: Internal Medicine

## 2023-12-03 DIAGNOSIS — M5412 Radiculopathy, cervical region: Secondary | ICD-10-CM

## 2023-12-06 ENCOUNTER — Other Ambulatory Visit: Payer: Self-pay | Admitting: Internal Medicine

## 2023-12-06 ENCOUNTER — Telehealth: Payer: Self-pay | Admitting: Internal Medicine

## 2023-12-06 DIAGNOSIS — I10 Essential (primary) hypertension: Secondary | ICD-10-CM

## 2023-12-06 DIAGNOSIS — I471 Supraventricular tachycardia, unspecified: Secondary | ICD-10-CM

## 2023-12-06 MED ORDER — AMLODIPINE BESYLATE 5 MG PO TABS: 5.0000 mg | ORAL_TABLET | Freq: Every day | ORAL | 0 refills | Status: DC

## 2023-12-06 NOTE — Telephone Encounter (Signed)
Copied from CRM [id number]. Topic: Clinical - Medical Advice >> Dec 06, 2023  3:36 PM Adaysia C wrote: Reason for CRM: Patient has attempted to speak to the provider through messaging via MyChart twice but has not received a response. Patient is experiencing dizziness and has requested a call back from the provider to discuss his health concerns. Please follow up with patient [phone]

## 2023-12-07 ENCOUNTER — Ambulatory Visit: Payer: Self-pay | Admitting: Internal Medicine

## 2023-12-07 ENCOUNTER — Other Ambulatory Visit: Payer: Self-pay | Admitting: Internal Medicine

## 2023-12-07 DIAGNOSIS — I10 Essential (primary) hypertension: Secondary | ICD-10-CM

## 2023-12-07 NOTE — Telephone Encounter (Signed)
Chief Complaint: Dizziness Symptoms: Moderate to Severe Dizziness Frequency: comes and goes, worse when changing positions Pertinent Negatives: Patient denies nausea, vomiting, chest pain, headache Disposition: [] ED /[] Urgent Care (no appt availability in office) / [x] Appointment(In office/virtual)/ []  [place] [place] [facility]/ [] Home Care/ [] Refused Recommended Disposition /[] [place] [facility]/ []  Follow-up with PCP Additional Notes: Patient states he recently had a change in his Amlodipine dose on last Thursday going from 5 MG to 10 MG. On Friday he was dizzy and he has not been able to drive since. Patient states he previously had a reaction to an increase in his Amlodipine so he went back to taking the 5 MG. Patient states he is still experiencing the dizziness and does not think it is related to Amlodipine dose change. Patient states he has reached out to PCP times and ENT provider without responses. Care advice was given and patient has been scheduled for an OV with PCP tomorrow afternoon for evaluation of symptoms.  Copied from CRM [id number]. Topic: Clinical - Red Word Triage >> Dec 07, 2023 11:25 AM Elizebeth Brooking wrote: [facility] that prompted transfer to Nurse Triage: Patient called in today stating he is experiencing dizziness not sure if it was the cause of the new medication, has been trying to get in contact the provider twice now and has not been able to Reason for Disposition  [1] MODERATE dizziness (e.g., interferes with normal activities) AND [2] has NOT been evaluated by doctor (or NP/PA) for this  (Exception: Dizziness caused by heat exposure, sudden standing, or poor fluid intake.)  Answer Assessment - Initial Assessment Questions 1. DESCRIPTION: "Describe your dizziness."     Dizziness  2. LIGHTHEADED: "Do you feel lightheaded?" (e.g., somewhat faint, woozy, weak upon standing)     Woozy 3. VERTIGO: "Do you feel like either you or the room is spinning or tilting?" (i.e. vertigo)      It felt like room is spinning  4. SEVERITY: "How bad is it?"  "Do you feel like you are going to faint?" "Can you stand and walk?"   - MILD: Feels slightly dizzy, but walking normally.   - MODERATE: Feels unsteady when walking, but not falling; interferes with normal activities (e.g., school, work).   - SEVERE: Unable to walk without falling, or requires assistance to walk without falling; feels like passing out now.      Moderate  5. ONSET:  "When did the dizziness begin?"     Friday morning 6. AGGRAVATING FACTORS: "Does anything make it worse?" (e.g., standing, change in head position)     Yes when I can positions 8. CAUSE: "What do you think is causing the dizziness?"     A change in medicine dose Amlodipine  9. RECURRENT SYMPTOM: "Have you had dizziness before?" If Yes, ask: "When was the last time?" "What happened that time?"     No  10. OTHER SYMPTOMS: "Do you have any other symptoms?" (e.g., fever, chest pain, vomiting, diarrhea, bleeding)       No  Protocols used: Dizziness - Lightheadedness-A-AH

## 2023-12-07 NOTE — Telephone Encounter (Signed)
Patient has been scheduled for tomorrow

## 2023-12-08 ENCOUNTER — Encounter: Payer: Self-pay | Admitting: Internal Medicine

## 2023-12-08 ENCOUNTER — Ambulatory Visit (INDEPENDENT_AMBULATORY_CARE_PROVIDER_SITE_OTHER): Payer: Medicare Other | Admitting: Internal Medicine

## 2023-12-08 VITALS — BP 144/64 | HR 71 | Temp 98.6°F | Resp 16 | Ht 69.0 in | Wt 179.4 lb

## 2023-12-08 DIAGNOSIS — I2510 Atherosclerotic heart disease of native coronary artery without angina pectoris: Secondary | ICD-10-CM | POA: Diagnosis not present

## 2023-12-08 DIAGNOSIS — M542 Cervicalgia: Secondary | ICD-10-CM | POA: Diagnosis not present

## 2023-12-08 DIAGNOSIS — I471 Supraventricular tachycardia, unspecified: Secondary | ICD-10-CM

## 2023-12-08 DIAGNOSIS — M6281 Muscle weakness (generalized): Secondary | ICD-10-CM | POA: Diagnosis not present

## 2023-12-08 DIAGNOSIS — R269 Unspecified abnormalities of gait and mobility: Secondary | ICD-10-CM | POA: Diagnosis not present

## 2023-12-08 DIAGNOSIS — I10 Essential (primary) hypertension: Secondary | ICD-10-CM | POA: Diagnosis not present

## 2023-12-08 DIAGNOSIS — M47812 Spondylosis without myelopathy or radiculopathy, cervical region: Secondary | ICD-10-CM | POA: Diagnosis not present

## 2023-12-08 MED ORDER — METOPROLOL SUCCINATE ER 25 MG PO TB24: 25.0000 mg | ORAL_TABLET | Freq: Every day | ORAL | 1 refills | Status: DC

## 2023-12-08 NOTE — Progress Notes (Unsigned)
Subjective:  Patient ID: Peter Patch, MD, male    DOB: 01-17-1946  Age: 78 y.o. MRN: [id number]  CC: Hypertension and Coronary Artery Disease   HPI Peter Patch, MD presents for f/up ----  Discussed the use of AI scribe software for clinical note transcription with the patient, who gave verbal consent to proceed.  History of Present Illness   He experiences dizziness, which he associates with palpitations. He has a history of dizziness after taking a double dose of amlodipine, which he discontinued due to pedal edema experienced ten years ago. He is concerned about the dizziness and its potential relation to his blood pressure.  He has a long-standing history of Meniere's disease since the age of 57, which has been managed with various tests and treatments. 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  He has a history of arrhythmias, specifically premature ventricular contractions (PVCs), and has worn a monitor for two weeks, which showed a bunch of PVCs. Despite this, his cardiologist was not concerned.  He has a history of peripheral neuropathy, which he attributes to long-term use of colchicine for pseudogout. He took colchicine for seven years, twice daily, and believes it caused his neuropathy. He reports experiencing numbness, weakness, or tingling, which he attributes to his peripheral neuropathy.  An antidepressant medication was ineffective due to side effects, and his wife, a pharmacist, noted that he tends to experience unusual side effects from medications. He is currently taking tramadol and has faced challenges with his drug benefit company regarding its prescription.       Outpatient Medications Prior to Visit  Medication Sig Dispense Refill   albuterol (VENTOLIN HFA) 108 (90 Base) MCG/ACT inhaler Inhale 2 puffs into the lungs every 6 (six) hours as needed for wheezing or shortness of  breath. 8 g 2   alfuzosin (UROXATRAL) 10 MG 24 hr tablet Take 1 tablet (10 mg total) by mouth every evening. 90 tablet 0   ALPRAZolam (XANAX) 0.5 MG tablet Take 1 tablet (0.5 mg total) by mouth 2 (two) times daily as needed for anxiety. 180 tablet 0   amLODipine (NORVASC) 5 MG tablet Take 1 tablet (5 mg total) by mouth daily. 90 tablet 0   aspirin EC 81 MG tablet Take 81 mg by mouth at bedtime. Swallow whole.     celecoxib (CELEBREX) 50 MG capsule Take 1 capsule (50 mg total) by mouth 2 (two) times daily. 180 capsule 0   dextromethorphan (DELSYM) 30 MG/5ML liquid Take 5 mLs (30 mg total) by mouth 2 (two) times daily. 148 mL 0   fluticasone (CUTIVATE) 0.05 % cream APPLY TO AFFECTED AREA TWICE A DAY 30 g 1   indapamide (LOZOL) 1.25 MG tablet TAKE 1 TABLET BY MOUTH DAILY. 90 tablet 0   montelukast (SINGULAIR) 10 MG tablet TAKE 1 TABLET BY MOUTH EVERYDAY AT BEDTIME (Patient taking differently: Take 10 mg by mouth at bedtime.) 90 tablet 0   olmesartan (BENICAR) 20 MG tablet TAKE 1 TABLET BY MOUTH EVERY DAY 90 tablet 0   pantoprazole (PROTONIX) 40 MG tablet TAKE 1 TABLET 1 OR 2 TIMES DAILY 30 MINUTES BEFORE MEALS 180 tablet 0   Probiotic Product (ALIGN) 4 MG CAPS Take 4 mg by mouth in the morning.     rosuvastatin (CRESTOR) 10 MG tablet TAKE 1 TABLET BY MOUTH EVERYDAY AT BEDTIME 90 tablet 1   traMADol (ULTRAM) 50 MG tablet Take 1 tablet (  50 mg total) by mouth every 12 (twelve) hours as needed. 180 tablet 0   No facility-administered medications prior to visit.    ROS Review of Systems  Objective:  BP (!) 144/64   Pulse 71   Temp 98.6 F (37 C) (Oral)   Resp 16   Ht 5\' 9"  (1.753 m)   Wt 179 lb 6.4 oz (81.4 kg)   SpO2 97%   BMI 26.49 kg/m   BP Readings from Last 3 Encounters:  12/08/23 (!) 144/64  11/03/23 (!) 142/70  06/15/23 122/78    Wt Readings from Last 3 Encounters:  12/08/23 179 lb 6.4 oz (81.4 kg)  11/03/23 176 lb 9.6 oz (80.1 kg)  06/15/23 176 lb (79.8 kg)    Physical  Exam  Lab Results  Component Value Date   WBC 10.2 11/03/2023   HGB 14.9 11/03/2023   HCT 44.4 11/03/2023   PLT 287.0 11/03/2023   GLUCOSE 91 11/03/2023   CHOL 132 05/03/2023   TRIG 124.0 05/03/2023   HDL 48.90 05/03/2023   LDLDIRECT 151.6 04/10/2011   LDLCALC 58 05/03/2023   ALT 23 05/03/2023   AST 25 05/03/2023   NA 138 11/03/2023   K 4.3 11/03/2023   CL 100 11/03/2023   CREATININE 1.19 11/03/2023   BUN 22 11/03/2023   CO2 28 11/03/2023   TSH 1.61 05/03/2023   PSA 2.10 06/04/2023   INR 1.0 03/09/2017    MR FOREARM LEFT W WO CONTRAST Result Date: 06/20/2023 CLINICAL DATA:  Evaluate antecubital fossa mass. EXAM: MRI OF THE LEFT FOREARM WITHOUT AND WITH CONTRAST TECHNIQUE: Multiplanar, multisequence MR imaging of the left elbow/forearm was performed before and after the administration of intravenous contrast. CONTRAST:  8 cc Vueway COMPARISON:  Ultrasound 06/17/2023 FINDINGS: The patient's palpable abnormality corresponds to a 2.9 x 1.5 x 1.5 cm complex cystic area located just below the antecubital fossa and adjacent to the biceps tendon. Fairly homogeneous low T1 signal intensity and mildly heterogeneous increased T2 signal intensity. After contrast administration there is thick rim like enhancement. 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 This can be associated with CPPD arthropathy and other inflammatory processes. No worrisome MR imaging features. The biceps tendon and brachialis tendons are intact. Small elbow joint effusion and mild enhancing synovium. No loose bodies. No erosive changes. The forearm musculature is unremarkable.  No myositis or mass. IMPRESSION: 1. 2.9 x 1.5 x 1.5 cm complex cystic area located just below the antecubital fossa and adjacent to the biceps tendon. 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 This can be associated with CPPD arthropathy and other inflammatory processes. No worrisome MR imaging features. 2. Small elbow  joint effusion and mild enhancing synovium. Electronically Signed   By: Rudie Meyer [name]   On: 06/20/2023 16:17    Assessment & Plan:  Paroxysmal SVT (supraventricular tachycardia) (HCC) -     Metoprolol Succinate ER; Take 1 tablet (25 mg total) by mouth daily.  Dispense: 90 tablet; Refill: 1  Primary hypertension  Coronary artery disease involving native coronary artery of native heart without angina pectoris -     Metoprolol Succinate ER; Take 1 tablet (25 mg total) by mouth daily.  Dispense: 90 tablet; Refill: 1     Follow-up: Return in about 4 months (around 04/06/2024).  Sanda Linger, MD

## 2023-12-08 NOTE — Patient Instructions (Signed)
Premature Ventricular Contraction  A premature ventricular contraction (PVC) is a type of irregular heartbeat (arrhythmia). The heart has four chambers, including the upper chambers (atria) and lower chambers (ventricles). Normally, an electrical signal starts in a group of cells called the sinoatrial node (SA node) and travels through the atria, causing them to pump blood into the ventricles. During a PVC, the heartbeat starts in one of the lower ventricles. This may cause the heartbeat to be shorter and less effective. In most cases, PVCs come and go and do not require treatment. What are the causes? Common causes of the condition include: Heart attack or coronary artery disease (CAD). Heart valve problems. Heart surgery. Infection of the heart (myocarditis). Inflammation of the heart. In many cases, the cause of this condition is not known. What increases the risk? The following factors may make you more likely to develop this condition: Age, especially being over age 19. Being male. An imbalance of salts and minerals in the body (electrolytes). Low blood oxygen levels or high carbon dioxide levels. Certain medicines, including over-the-counter and prescribed medicines. High blood pressure. Obesity. Episodes may be triggered by: Vigorous exercise. Tobacco, alcohol, or caffeine use. Illegal drug use. Emotional stress. Poor or irregular sleep. What are the signs or symptoms? The main symptoms of this condition are fast or irregular heartbeats (palpitations) or the feeling of a pause in the heartbeat. Other symptoms include: Shortness of breath. Difficulty exercising. Chest pain. Feeling tired. Dizziness. In some cases, there are no symptoms. How is this diagnosed? This condition may be diagnosed based on: Your medical history or symptoms. A physical exam. Your health care provider may listen to your heart. Tests, such as: Blood tests. An ECG (electrocardiogram) to monitor the  electrical activity of your heart. An ambulatory cardiac monitor that records your heartbeats for 24 hours or more. Stress tests to see how exercise affects your heart rhythm and blood supply. An echocardiogram, which creates an image of your heart. An electrophysiology study (EPS) to check for electrical problems in your heart. How is this treated? Treatment for this condition depends on any underlying conditions, the type of PVC, how many PVCs you have had, and if the symptoms are affecting your daily life. Possible treatments include: Avoiding things that cause PVCs (triggers). These include caffeine, tobacco, and alcohol. Taking medicines if symptoms are severe or if the arrhythmias happen a lot. Getting treatment for underlying conditions that cause PVCs. Having an implantable cardioverter defibrillator (ICD) placed in the chest to monitor the heartbeat. The monitor sends a shock to the heart if it senses an arrhythmia and brings the heartbeat back to normal. Having a catheter ablation procedure to destroy the part of the heart tissue that sends abnormal signals. In many cases, no treatment is required. Follow these instructions at home: Lifestyle  Do not use any products that contain nicotine or tobacco. These products include cigarettes, chewing tobacco, and vaping devices, such as e-cigarettes. If you need help quitting, ask your health care provider. Do not use illegal drugs. Exercise regularly. Ask your health care provider what type of exercise is safe for you. Try to get at least 7-9 hours of sleep each night. Find healthy ways to manage stress. Avoid stressful situations when possible. Alcohol use Do not drink alcohol if: Your health care provider tells you not to drink. You are pregnant, may be pregnant, or are planning to become pregnant. Alcohol triggers your episodes. If you drink alcohol: Limit how much you have to: 0-1  drink a day for women. 0-2 drinks a day for  men. Know how much alcohol is in your drink. In the [place] one drink equals one 12 oz bottle of beer (355 mL), one 5 oz glass of wine (148 mL), or one 1 oz glass of hard liquor (44 mL). General instructions Take over-the-counter and prescription medicines only as told by your health care provider. If caffeine triggers episodes of PVC, do not eat, drink, or use anything with caffeine in it. Contact a health care provider if: You feel palpitations often. You have nausea and vomiting. Get help right away if: You have chest pain. You have trouble breathing. You start sweating for no reason. You become light-headed or you faint. These symptoms may be an emergency. Get help right away. Call 911. Do not wait to see if the symptoms will go away. Do not drive yourself to the hospital. This information is not intended to replace advice given to you by your health care provider. Make sure you discuss any questions you have with your health care provider. Document Revised: 03/13/2022 Document Reviewed: 03/13/2022 Elsevier Patient Education  2024 [employer].

## 2023-12-18 ENCOUNTER — Encounter: Payer: Self-pay | Admitting: Internal Medicine

## 2023-12-19 ENCOUNTER — Ambulatory Visit
Admission: RE | Admit: 2023-12-19 | Discharge: 2023-12-19 | Disposition: A | Discharge status: Home / Self Care | Payer: Medicare Other | Source: Ambulatory Visit | Attending: Neurological Surgery | Admitting: Neurological Surgery

## 2023-12-19 DIAGNOSIS — M5412 Radiculopathy, cervical region: Secondary | ICD-10-CM

## 2023-12-19 DIAGNOSIS — Z981 Arthrodesis status: Secondary | ICD-10-CM | POA: Diagnosis not present

## 2023-12-19 DIAGNOSIS — M47812 Spondylosis without myelopathy or radiculopathy, cervical region: Secondary | ICD-10-CM | POA: Diagnosis not present

## 2023-12-19 DIAGNOSIS — M5021 Other cervical disc displacement,  high cervical region: Secondary | ICD-10-CM | POA: Diagnosis not present

## 2023-12-21 DIAGNOSIS — M542 Cervicalgia: Secondary | ICD-10-CM | POA: Diagnosis not present

## 2023-12-21 DIAGNOSIS — M47812 Spondylosis without myelopathy or radiculopathy, cervical region: Secondary | ICD-10-CM | POA: Diagnosis not present

## 2023-12-21 DIAGNOSIS — R269 Unspecified abnormalities of gait and mobility: Secondary | ICD-10-CM | POA: Diagnosis not present

## 2023-12-21 DIAGNOSIS — M6281 Muscle weakness (generalized): Secondary | ICD-10-CM | POA: Diagnosis not present

## 2023-12-24 ENCOUNTER — Other Ambulatory Visit: Payer: Self-pay | Admitting: Neurological Surgery

## 2023-12-24 DIAGNOSIS — M5412 Radiculopathy, cervical region: Secondary | ICD-10-CM

## 2023-12-26 ENCOUNTER — Encounter: Payer: Self-pay | Admitting: Internal Medicine

## 2023-12-27 ENCOUNTER — Other Ambulatory Visit: Payer: Self-pay

## 2023-12-27 MED ORDER — MONTELUKAST SODIUM 10 MG PO TABS: ORAL_TABLET | ORAL | 0 refills | Status: DC

## 2023-12-28 ENCOUNTER — Ambulatory Visit
Admission: RE | Admit: 2023-12-28 | Discharge: 2023-12-28 | Disposition: A | Discharge status: Home / Self Care | Payer: Medicare Other | Source: Ambulatory Visit | Attending: Neurological Surgery | Admitting: Neurological Surgery

## 2023-12-28 DIAGNOSIS — Z981 Arthrodesis status: Secondary | ICD-10-CM | POA: Diagnosis not present

## 2023-12-28 DIAGNOSIS — M47812 Spondylosis without myelopathy or radiculopathy, cervical region: Secondary | ICD-10-CM | POA: Diagnosis not present

## 2023-12-28 DIAGNOSIS — M5412 Radiculopathy, cervical region: Secondary | ICD-10-CM

## 2023-12-28 DIAGNOSIS — M4802 Spinal stenosis, cervical region: Secondary | ICD-10-CM | POA: Diagnosis not present

## 2023-12-31 DIAGNOSIS — M5412 Radiculopathy, cervical region: Secondary | ICD-10-CM | POA: Diagnosis not present

## 2023-12-31 DIAGNOSIS — Z6825 Body mass index (BMI) 25.0-25.9, adult: Secondary | ICD-10-CM | POA: Diagnosis not present

## 2024-01-03 ENCOUNTER — Other Ambulatory Visit: Payer: Self-pay | Admitting: Neurological Surgery

## 2024-01-04 DIAGNOSIS — M542 Cervicalgia: Secondary | ICD-10-CM | POA: Diagnosis not present

## 2024-01-04 DIAGNOSIS — M6281 Muscle weakness (generalized): Secondary | ICD-10-CM | POA: Diagnosis not present

## 2024-01-04 DIAGNOSIS — M47812 Spondylosis without myelopathy or radiculopathy, cervical region: Secondary | ICD-10-CM | POA: Diagnosis not present

## 2024-01-04 DIAGNOSIS — R269 Unspecified abnormalities of gait and mobility: Secondary | ICD-10-CM | POA: Diagnosis not present

## 2024-01-09 ENCOUNTER — Other Ambulatory Visit: Payer: Self-pay | Admitting: Internal Medicine

## 2024-01-09 DIAGNOSIS — K219 Gastro-esophageal reflux disease without esophagitis: Secondary | ICD-10-CM

## 2024-01-11 DIAGNOSIS — M542 Cervicalgia: Secondary | ICD-10-CM | POA: Diagnosis not present

## 2024-01-11 DIAGNOSIS — R269 Unspecified abnormalities of gait and mobility: Secondary | ICD-10-CM | POA: Diagnosis not present

## 2024-01-11 DIAGNOSIS — M6281 Muscle weakness (generalized): Secondary | ICD-10-CM | POA: Diagnosis not present

## 2024-01-11 DIAGNOSIS — M47812 Spondylosis without myelopathy or radiculopathy, cervical region: Secondary | ICD-10-CM | POA: Diagnosis not present

## 2024-01-21 ENCOUNTER — Encounter: Payer: Self-pay | Admitting: Internal Medicine

## 2024-01-21 ENCOUNTER — Telehealth: Payer: Self-pay | Admitting: *Deleted

## 2024-01-21 NOTE — Telephone Encounter (Signed)
 Pt called back. Made appt for 4/8 with Tereso Newcomer, PA

## 2024-01-21 NOTE — Telephone Encounter (Signed)
    Primary Cardiologist:Peter Eden Emms, MD  Chart reviewed as part of pre-operative protocol coverage. Because of [name], MD's past medical history and time since last visit, he/she will require a follow-up visit in order to better assess preoperative cardiovascular risk.  Pre-op covering staff: - Please schedule In office appointment and call patient to inform them. - Please contact requesting surgeon's office via preferred method (i.e, phone, fax) to inform them of need for appointment prior to surgery.  Patient's aspirin may be held for 5 to 7 days prior to surgery.  Please resume as soon as hemostasis is achieved.  If applicable, this message will also be routed to pharmacy pool and/or primary cardiologist for input on holding anticoagulant/antiplatelet agent as requested below so that this information is available at time of patient's appointment.   Peter Asters, NP  01/21/2024, 12:31 PM

## 2024-01-21 NOTE — Telephone Encounter (Signed)
 Lvm for pt to call back and schedule in office appt for pre op clearance.

## 2024-01-21 NOTE — Telephone Encounter (Signed)
   Pre-operative Risk Assessment    Patient Name: EON ZUNKER, MD  DOB: May 10, 1946 MRN: [id number]   Date of last office visit: 12/22/2022 Date of next office visit: None  Request for Surgical Clearance    Procedure:   Cervical Foraminotomy  Date of Surgery:  Clearance 02/14/24                                 Surgeon:  Dr. Barnett Abu Surgeon's Group or Practice Name:  [facility] Phone number:  [phone] Fax number:  [phone]   Type of Clearance Requested:   - Medical  - Pharmacy:  Hold Aspirin Not indicated   Type of Anesthesia:  General    Additional requests/questions:    Signed, Emmit Pomfret   01/21/2024, 11:49 AM

## 2024-01-24 DIAGNOSIS — D2262 Melanocytic nevi of left upper limb, including shoulder: Secondary | ICD-10-CM | POA: Diagnosis not present

## 2024-01-24 DIAGNOSIS — L111 Transient acantholytic dermatosis [Grover]: Secondary | ICD-10-CM | POA: Diagnosis not present

## 2024-01-24 DIAGNOSIS — L817 Pigmented purpuric dermatosis: Secondary | ICD-10-CM | POA: Diagnosis not present

## 2024-01-24 DIAGNOSIS — L821 Other seborrheic keratosis: Secondary | ICD-10-CM | POA: Diagnosis not present

## 2024-01-24 DIAGNOSIS — L905 Scar conditions and fibrosis of skin: Secondary | ICD-10-CM | POA: Diagnosis not present

## 2024-01-24 DIAGNOSIS — D485 Neoplasm of uncertain behavior of skin: Secondary | ICD-10-CM | POA: Diagnosis not present

## 2024-01-24 DIAGNOSIS — Z85820 Personal history of malignant melanoma of skin: Secondary | ICD-10-CM | POA: Diagnosis not present

## 2024-01-24 DIAGNOSIS — Z85828 Personal history of other malignant neoplasm of skin: Secondary | ICD-10-CM | POA: Diagnosis not present

## 2024-01-24 DIAGNOSIS — D225 Melanocytic nevi of trunk: Secondary | ICD-10-CM | POA: Diagnosis not present

## 2024-01-24 DIAGNOSIS — L570 Actinic keratosis: Secondary | ICD-10-CM | POA: Diagnosis not present

## 2024-01-24 DIAGNOSIS — D2261 Melanocytic nevi of right upper limb, including shoulder: Secondary | ICD-10-CM | POA: Diagnosis not present

## 2024-01-26 DIAGNOSIS — M542 Cervicalgia: Secondary | ICD-10-CM | POA: Diagnosis not present

## 2024-01-26 DIAGNOSIS — M47812 Spondylosis without myelopathy or radiculopathy, cervical region: Secondary | ICD-10-CM | POA: Diagnosis not present

## 2024-01-26 DIAGNOSIS — R269 Unspecified abnormalities of gait and mobility: Secondary | ICD-10-CM | POA: Diagnosis not present

## 2024-01-26 DIAGNOSIS — M6281 Muscle weakness (generalized): Secondary | ICD-10-CM | POA: Diagnosis not present

## 2024-01-29 ENCOUNTER — Other Ambulatory Visit: Payer: Self-pay | Admitting: Internal Medicine

## 2024-01-29 DIAGNOSIS — N401 Enlarged prostate with lower urinary tract symptoms: Secondary | ICD-10-CM

## 2024-01-31 NOTE — Progress Notes (Signed)
 Surgical Instructions   Your procedure is scheduled on Monday, April 21, 25.. Report to [facility] Main Entrance "A" at 5:30 A.M., then check in with the Admitting office. Any questions or running late day of surgery: call [phone]  Questions prior to your surgery date: call [phone], Monday-Friday, 8am-4pm. If you experience any cold or flu symptoms such as cough, fever, chills, shortness of breath, etc. between now and your scheduled surgery, please notify us at the above number.     Remember:  Do not eat or drink after midnight the night before your surgery   Take these medicines the morning of surgery with A SIP OF WATER  amLODipine (NORVASC)    May take these medicines IF NEEDED: albuterol (VENTOLIN HFA) inhaler please bring with you on day of surgery   ALPRAZolam Prudy Feeler)  traMADol (ULTRAM)    Follow your surgeon's instructions on when to stop Aspirin AND celecoxib (CELEBREX) .  If no instructions were given by your surgeon then you will need to call the office to get those instructions.    One week prior to surgery, STOP taking any Aleve, Naproxen, Ibuprofen, Motrin, Advil, Goody's, BC's, all herbal medications, fish oil, and non-prescription vitamins.                     Do NOT Smoke (Tobacco/Vaping) for 24 hours prior to your procedure.  If you use a CPAP at night, you may bring your mask/headgear for your overnight stay.   You will be asked to remove any contacts, glasses, piercing's, hearing aid's, dentures/partials prior to surgery. Please bring cases for these items if needed.    Patients discharged the day of surgery will not be allowed to drive home, and someone needs to stay with them for 24 hours.  SURGICAL WAITING ROOM VISITATION Patients may have no more than 2 support people in the waiting area - these visitors may rotate.   Pre-op nurse will coordinate an appropriate time for 1 ADULT support person, who may not rotate, to accompany patient in  pre-op.  Children under the age of 43 must have an adult with them who is not the patient and must remain in the main waiting area with an adult.  If the patient needs to stay at the hospital during part of their recovery, the visitor guidelines for inpatient rooms apply.  Please refer to the [facility] website for the visitor guidelines for any additional information.   If you received a COVID test during your pre-op visit  it is requested that you wear a mask when out in public, stay away from anyone that may not be feeling well and notify your surgeon if you develop symptoms. If you have been in contact with anyone that has tested positive in the last 10 days please notify you surgeon.      Pre-operative 5 CHG Bathing Instructions   You can play a key role in reducing the risk of infection after surgery. Your skin needs to be as free of germs as possible. You can reduce the number of germs on your skin by washing with CHG (chlorhexidine gluconate) soap before surgery. CHG is an antiseptic soap that kills germs and continues to kill germs even after washing.   DO NOT use if you have an allergy to chlorhexidine/CHG or antibacterial soaps. If your skin becomes reddened or irritated, stop using the CHG and notify one of our RNs at [phone].   Please shower with the CHG soap starting  4 days before surgery using the following schedule:     Please keep in mind the following:  DO NOT shave, including legs and underarms, starting the day of your first shower.   You may shave your face at any point before/day of surgery.  Place clean sheets on your bed the day you start using CHG soap. Use a clean washcloth (not used since being washed) for each shower. DO NOT sleep with pets once you start using the CHG.   CHG Shower Instructions:  Wash your face and private area with normal soap. If you choose to wash your hair, wash first with your normal shampoo.  After you use shampoo/soap, rinse  your hair and body thoroughly to remove shampoo/soap residue.  Turn the water OFF and apply about 3 tablespoons (45 ml) of CHG soap to a CLEAN washcloth.  Apply CHG soap ONLY FROM YOUR NECK DOWN TO YOUR TOES (washing for 3-5 minutes)  DO NOT use CHG soap on face, private areas, open wounds, or sores.  Pay special attention to the area where your surgery is being performed.  If you are having back surgery, having someone wash your back for you may be helpful. Wait 2 minutes after CHG soap is applied, then you may rinse off the CHG soap.  Pat dry with a clean towel  Put on clean clothes/pajamas   If you choose to wear lotion, please use ONLY the CHG-compatible lotions that are listed below.  Additional instructions for the day of surgery: DO NOT APPLY any lotions, deodorants, cologne, or perfumes.   Do not bring valuables to the hospital. [facility] is not responsible for any belongings/valuables. Do not wear nail polish, gel polish, artificial nails, or any other type of covering on natural nails (fingers and toes) Do not wear jewelry or makeup Put on clean/comfortable clothes.  Please brush your teeth.  Ask your nurse before applying any prescription medications to the skin.     CHG Compatible Lotions   Aveeno Moisturizing lotion  Cetaphil Moisturizing Cream  Cetaphil Moisturizing Lotion  Clairol Herbal Essence Moisturizing Lotion, Dry Skin  Clairol Herbal Essence Moisturizing Lotion, Extra Dry Skin  Clairol Herbal Essence Moisturizing Lotion, Normal Skin  Curel Age Defying Therapeutic Moisturizing Lotion with Alpha Hydroxy  Curel Extreme Care Body Lotion  Curel Soothing Hands Moisturizing Hand Lotion  Curel Therapeutic Moisturizing Cream, Fragrance-Free  Curel Therapeutic Moisturizing Lotion, Fragrance-Free  Curel Therapeutic Moisturizing Lotion, Original Formula  Eucerin Daily Replenishing Lotion  Eucerin Dry Skin Therapy Plus Alpha Hydroxy Crme  Eucerin Dry Skin Therapy  Plus Alpha Hydroxy Lotion  Eucerin Original Crme  Eucerin Original Lotion  Eucerin Plus Crme Eucerin Plus Lotion  Eucerin TriLipid Replenishing Lotion  Keri Anti-Bacterial Hand Lotion  Keri Deep Conditioning Original Lotion Dry Skin Formula Softly Scented  Keri Deep Conditioning Original Lotion, Fragrance Free Sensitive Skin Formula  Keri Lotion Fast Absorbing Fragrance Free Sensitive Skin Formula  Keri Lotion Fast Absorbing Softly Scented Dry Skin Formula  Keri Original Lotion  Keri Skin Renewal Lotion Keri Silky Smooth Lotion  Keri Silky Smooth Sensitive Skin Lotion  Nivea Body Creamy Conditioning Oil  Nivea Body Extra Enriched Lotion  Nivea Body Original Lotion  Nivea Body Sheer Moisturizing Lotion Nivea Crme  Nivea Skin Firming Lotion  NutraDerm 30 Skin Lotion  NutraDerm Skin Lotion  NutraDerm Therapeutic Skin Cream  NutraDerm Therapeutic Skin Lotion  ProShield Protective Hand Cream  Provon moisturizing lotion  Please read over the following fact sheets that you  were given.

## 2024-02-01 ENCOUNTER — Encounter (HOSPITAL_COMMUNITY): Payer: Self-pay

## 2024-02-01 ENCOUNTER — Encounter (HOSPITAL_COMMUNITY)
Admission: RE | Admit: 2024-02-01 | Discharge: 2024-02-01 | Disposition: A | Discharge status: Home / Self Care | Source: Ambulatory Visit | Attending: Neurological Surgery | Admitting: Neurological Surgery

## 2024-02-01 ENCOUNTER — Other Ambulatory Visit: Payer: Self-pay

## 2024-02-01 ENCOUNTER — Other Ambulatory Visit: Payer: Self-pay | Admitting: Neurological Surgery

## 2024-02-01 ENCOUNTER — Ambulatory Visit: Admitting: Physician Assistant

## 2024-02-01 ENCOUNTER — Encounter: Payer: Self-pay | Admitting: Physician Assistant

## 2024-02-01 VITALS — BP 110/60 | HR 79 | Ht 69.0 in | Wt 175.6 lb

## 2024-02-01 VITALS — BP 137/70 | HR 87 | Temp 98.3°F | Resp 18 | Ht 69.0 in | Wt 174.4 lb

## 2024-02-01 DIAGNOSIS — I10 Essential (primary) hypertension: Secondary | ICD-10-CM | POA: Diagnosis not present

## 2024-02-01 DIAGNOSIS — Z981 Arthrodesis status: Secondary | ICD-10-CM | POA: Insufficient documentation

## 2024-02-01 DIAGNOSIS — Z01818 Encounter for other preprocedural examination: Secondary | ICD-10-CM | POA: Diagnosis not present

## 2024-02-01 DIAGNOSIS — I471 Supraventricular tachycardia, unspecified: Secondary | ICD-10-CM | POA: Diagnosis not present

## 2024-02-01 DIAGNOSIS — E7800 Pure hypercholesterolemia, unspecified: Secondary | ICD-10-CM | POA: Diagnosis not present

## 2024-02-01 DIAGNOSIS — I2510 Atherosclerotic heart disease of native coronary artery without angina pectoris: Secondary | ICD-10-CM | POA: Diagnosis not present

## 2024-02-01 DIAGNOSIS — I361 Nonrheumatic tricuspid (valve) insufficiency: Secondary | ICD-10-CM

## 2024-02-01 DIAGNOSIS — Z8616 Personal history of COVID-19: Secondary | ICD-10-CM | POA: Insufficient documentation

## 2024-02-01 DIAGNOSIS — I493 Ventricular premature depolarization: Secondary | ICD-10-CM | POA: Diagnosis not present

## 2024-02-01 DIAGNOSIS — Z85820 Personal history of malignant melanoma of skin: Secondary | ICD-10-CM | POA: Diagnosis not present

## 2024-02-01 DIAGNOSIS — Z01810 Encounter for preprocedural cardiovascular examination: Secondary | ICD-10-CM | POA: Diagnosis not present

## 2024-02-01 DIAGNOSIS — M4722 Other spondylosis with radiculopathy, cervical region: Secondary | ICD-10-CM | POA: Insufficient documentation

## 2024-02-01 HISTORY — DX: Personal history of other diseases of the digestive system: Z87.19

## 2024-02-01 LAB — CBC
HCT: 45 % (ref 39.0–52.0)
Hemoglobin: 14.7 g/dL (ref 13.0–17.0)
MCH: 30.8 pg (ref 26.0–34.0)
MCHC: 32.7 g/dL (ref 30.0–36.0)
MCV: 94.1 fL (ref 80.0–100.0)
Platelets: 286 10*3/uL (ref 150–400)
RBC: 4.78 MIL/uL (ref 4.22–5.81)
RDW: 12 % (ref 11.5–15.5)
WBC: 8.2 10*3/uL (ref 4.0–10.5)
nRBC: 0 % (ref 0.0–0.2)

## 2024-02-01 LAB — BASIC METABOLIC PANEL WITH GFR
Anion gap: 10 (ref 5–15)
BUN: 19 mg/dL (ref 8–23)
CO2: 26 mmol/L (ref 22–32)
Calcium: 9.9 mg/dL (ref 8.9–10.3)
Chloride: 102 mmol/L (ref 98–111)
Creatinine, Ser: 1.22 mg/dL (ref 0.61–1.24)
GFR, Estimated: >60 mL/min (ref >60)
Glucose, Bld: 105 mg/dL — ABNORMAL HIGH (ref 70–99)
Potassium: 4.6 mmol/L (ref 3.5–5.1)
Sodium: 138 mmol/L (ref 135–145)

## 2024-02-01 LAB — SURGICAL PCR SCREEN
MRSA, PCR: NEGATIVE
Staphylococcus aureus: NEGATIVE

## 2024-02-01 MED ORDER — ATENOLOL 25 MG PO TABS: 25.0000 mg | ORAL_TABLET | Freq: Two times a day (BID) | ORAL | 3 refills | Status: DC

## 2024-02-01 MED ORDER — AMLODIPINE BESYLATE 5 MG PO TABS: 2.5000 mg | ORAL_TABLET | Freq: Every day | ORAL | 3 refills | Status: DC

## 2024-02-01 NOTE — Assessment & Plan Note (Signed)
 PVCs and non-sustained SVT with recent monitoring showing several non-sustained SVT episodes, likely atrial tachycardia. Longest episode was 17 beats. PVC occurrence is less than 1%,. Symptoms include fatigue and occasional palpitations. He could not tolerate Metoprolol succinate due to fatigue. Atenolol is hydrophilic and less likely to cause fatigue. - Prescribe atenolol 25 mg - Reduce amlodipine to 2.5 mg daily to allow more room in his BP to take Atenolol

## 2024-02-01 NOTE — Progress Notes (Signed)
 Cardiology Office Note:    Date:  02/01/2024  ID:  Peter Patch, MD, DOB 11-Dec-1945, MRN [id number] PCP: Peter Grandchild, MD  [facility] Providers Cardiologist:  Peter Haws, MD Electrophysiologist:  Peter Prude, MD       Patient Profile:      Coronary artery disease, nonobstructive TTE 06/03/2015: EF 65-70, no RWMA, GR 1 DD, mild TR ETT 02/25/21: Downsloping anterolateral and inferior ST depression with exercise CCTA 03/06/2021: Mid LCx focal severe soft plaque proximally-FFR indeterminant LHC 03/18/21: LCx mid 40-60; LAD and DX with minimal plaquing; RCA irregularities; EF 55 Supraventricular Tachycardia  Eval by EP Peter [name]) in 11/2022 >> prob ATach >> conservative Rx/watchful waiting Monitor 10/2023: Several nonsustained SVT episodes (longest 17 beats)-rhythm strip suggest atrial tachycardia; no A-fib Hypertension  Hx of accelerated HTN and tachycardia after neck surgery 2020 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 He was last seen by Dr. Eden [name] in 03/2022. He needs cervical spine surgery with Dr. Danielle [name] under gen anesthesia 02/14/24.   He is here alone. He has not had heart failure symptoms but has fatigue, which he attributes to a COVID-19 infection a year ago. He notes fluctuations in blood pressure, sometimes as low as 100/55 mmHg, and attributes this to the COVID-19 infection. He has a history of premature ventricular contractions (PVCs) and non-sustained supraventricular tachycardia (SVT), with the most recent monitor in January 2025 showing several non-sustained episodes of SVT, the longest being 17 beats. He has not had shortness of breath, chest tightness, or syncope.   ROS-See HPI     Studies Reviewed:    EKG Interpretation Date/Time:  Tuesday February 01 2024 14:05:58 EDT Ventricular Rate:  79 PR Interval:  142 QRS Duration:  80 QT Interval:  384 QTC Calculation: 440 R Axis:   21  Text Interpretation: Sinus rhythm with occasional Premature ventricular complexes No significant change since last tracing Confirmed by Peter [name] [phone]) on 02/01/2024 2:09:32 PM    Results LABS Potassium: 4.6 (02/01/2024) Creatinine: 1.22 (02/01/2024) ALT: 23 (05/03/2023) Cholesterol: 132 (05/03/2023) HDL: 48.9 (05/03/2023) LDL: 58 (05/03/2023) Triglycerides: 124 (05/03/2023) Hemoglobin: 14.7 (02/01/2024) TSH: 1.61 (05/03/2023)    Risk Assessment/Calculations:             Physical Exam:   VS:  BP 110/60   Pulse 79   Ht 5\' 9"  (1.753 m)   Wt 175 lb 9.6 oz (79.7 kg)   SpO2 96%   BMI 25.93 kg/m    Wt Readings from Last 3 Encounters:  02/01/24 175 lb 9.6 oz (79.7 kg)  02/01/24 174 lb 6.4 oz (79.1 kg)  12/08/23 179 lb 6.4 oz (81.4 kg)    Constitutional:      Appearance: Healthy appearance. Not in distress.  Neck:     Vascular: JVD normal.  Pulmonary:     Breath sounds: Normal breath sounds. No wheezing. No rales.  Cardiovascular:     Normal rate. Regular rhythm.     Murmurs: There is no murmur.  Edema:    Peripheral edema absent.  Abdominal:     Palpations: Abdomen is soft.        Assessment and Plan:   Assessment & Plan Preoperative cardiovascular  examination Peter [name] perioperative risk of a major cardiac event is 0.9% according to the Revised Cardiac Risk Index (RCRI).  Therefore, he is at low risk for perioperative complications.    Recommendations: According to ACC/AHA guidelines, no further cardiovascular testing needed.  The patient may proceed to surgery at acceptable risk.   Antiplatelet and/or Anticoagulation Recommendations: Aspirin can be held for 7 days prior to his surgery.  Please resume Aspirin post operatively when it is felt to be safe from a bleeding  standpoint.  Coronary artery disease involving native coronary artery of native heart without angina pectoris Non-obstructive coronary artery disease with mid LCX stenosis of 40-60% identified in May 2022. He is asymptomatic with no chest pain to suggest angina.  Most recent LDL is optimal.  - Continue aspirin 81 mg daily - Continue Crestor 10 mg daily Paroxysmal SVT (supraventricular tachycardia) (HCC) PVCs and non-sustained SVT with recent monitoring showing several non-sustained SVT episodes, likely atrial tachycardia. Longest episode was 17 beats. PVC occurrence is less than 1%,. Symptoms include fatigue and occasional palpitations. He could not tolerate Metoprolol succinate due to fatigue. Atenolol is hydrophilic and less likely to cause fatigue. - Prescribe atenolol 25 mg - Reduce amlodipine to 2.5 mg daily to allow more room in his BP to take Atenolol Nonrheumatic tricuspid valve regurgitation He has a hx of biceps tendon rupture and bilateral carpal tunnel syndrome and peripheral neuropathy. His EKG is normal aside from PVCs. He does not have CHF symptoms. He has not had an echocardiogram since 2016. He did have mild TR at that time. I will get a follow up echocardiogram to recheck TR.  If he has LVH on the echocardiogram, I would have a low threshold to get a PYP scan, SPEP, UPEP to screen for amyloid. Essential hypertension Hypertension managed with amlodipine, olmesartan, and indapamide. BP is controlled. Will reduce Amlodipine to 2.5 mg once daily to allow more BP to take Atenolol for PVCs, palpitations.       Dispo:  Return in about 1 year (around 01/31/2025) for Routine Follow Up, w/ Dr. Eden [name].  Signed, Peter Newcomer, PA-C

## 2024-02-01 NOTE — Pre-Procedure Instructions (Addendum)
 Surgical Instructions     Your procedure is scheduled on Monday, February 14, 2024.  Report to [facility] Main Entrance "A" at 5:30 A.M., then check in with the Admitting office. Any questions or running late day of surgery: call [phone]   Questions prior to your surgery date: call [phone], Monday-Friday, 8am-4pm. If you experience any cold or flu symptoms such as cough, fever, chills, shortness of breath, etc. between now and your scheduled surgery, please notify us at the above number.            Remember:       Do not eat or drink after midnight the night before your surgery     Take these medicines the morning of surgery with A SIP OF WATER  none     May take these medicines IF NEEDED: albuterol (VENTOLIN HFA) inhaler please bring with you on day of surgery   ALPRAZolam Prudy Feeler)  traMADol (ULTRAM)      Follow your surgeon's instructions on when to stop Aspirin.  If no instructions were given by your surgeon then you will need to call the office to get those instructions.     One week prior to surgery, STOP taking any Aleve, Naproxen, Ibuprofen, Motrin, Advil, Goody's, BC's, all herbal medications, fish oil, and non-prescription vitamins including your Celebrex.                     Do NOT Smoke (Tobacco/Vaping) for 24 hours prior to your procedure.   If you use a CPAP at night, you may bring your mask/headgear for your overnight stay.   You will be asked to remove any contacts, glasses, piercing's, hearing aid's, dentures/partials prior to surgery. Please bring cases for these items if needed.    Patients discharged the day of surgery will not be allowed to drive home, and someone needs to stay with them for 24 hours.   SURGICAL WAITING ROOM VISITATION Patients may have no more than 2 support people in the waiting area - these visitors may rotate.   Pre-op nurse will coordinate an appropriate time for 1 ADULT support person, who may not rotate, to accompany patient  in pre-op.  Children under the age of 45 must have an adult with them who is not the patient and must remain in the main waiting area with an adult.   If the patient needs to stay at the hospital during part of their recovery, the visitor guidelines for inpatient rooms apply.   Please refer to the [facility] website for the visitor guidelines for any additional information.     If you received a COVID test during your pre-op visit  it is requested that you wear a mask when out in public, stay away from anyone that may not be feeling well and notify your surgeon if you develop symptoms. If you have been in contact with anyone that has tested positive in the last 10 days please notify you surgeon.         Pre-operative 5 CHG Bathing Instructions    You can play a key role in reducing the risk of infection after surgery. Your skin needs to be as free of germs as possible. You can reduce the number of germs on your skin by washing with CHG (chlorhexidine gluconate) soap before surgery. CHG is an antiseptic soap that kills germs and continues to kill germs even after washing.    DO NOT use if you have an allergy to chlorhexidine/CHG  or antibacterial soaps. If your skin becomes reddened or irritated, stop using the CHG and notify one of our RNs at [phone].    Please shower with the CHG soap starting 4 days before surgery using the following schedule:       Please keep in mind the following:  DO NOT shave, including legs and underarms, starting the day of your first shower.   You may shave your face at any point before/day of surgery.  Place clean sheets on your bed the day you start using CHG soap. Use a clean washcloth (not used since being washed) for each shower. DO NOT sleep with pets once you start using the CHG.    CHG Shower Instructions:  Wash your face and private area with normal soap. If you choose to wash your hair, wash first with your normal shampoo.  After you use  shampoo/soap, rinse your hair and body thoroughly to remove shampoo/soap residue.  Turn the water OFF and apply about 3 tablespoons (45 ml) of CHG soap to a CLEAN washcloth.  Apply CHG soap ONLY FROM YOUR NECK DOWN TO YOUR TOES (washing for 3-5 minutes)  DO NOT use CHG soap on face, private areas, open wounds, or sores.  Pay special attention to the area where your surgery is being performed.  If you are having back surgery, having someone wash your back for you may be helpful. Wait 2 minutes after CHG soap is applied, then you may rinse off the CHG soap.  Pat dry with a clean towel  Put on clean clothes/pajamas   If you choose to wear lotion, please use ONLY the CHG-compatible lotions that are listed below.   Additional instructions for the day of surgery: DO NOT APPLY any lotions, deodorants, cologne, or perfumes.   Do not bring valuables to the hospital. [facility] is not responsible for any belongings/valuables. Do not wear nail polish, gel polish, artificial nails, or any other type of covering on natural nails (fingers and toes) Do not wear jewelry or makeup Put on clean/comfortable clothes.  Please brush your teeth.  Ask your nurse before applying any prescription medications to the skin.        CHG Compatible Lotions    Aveeno Moisturizing lotion  Cetaphil Moisturizing Cream  Cetaphil Moisturizing Lotion  Clairol Herbal Essence Moisturizing Lotion, Dry Skin  Clairol Herbal Essence Moisturizing Lotion, Extra Dry Skin  Clairol Herbal Essence Moisturizing Lotion, Normal Skin  Curel Age Defying Therapeutic Moisturizing Lotion with Alpha Hydroxy  Curel Extreme Care Body Lotion  Curel Soothing Hands Moisturizing Hand Lotion  Curel Therapeutic Moisturizing Cream, Fragrance-Free  Curel Therapeutic Moisturizing Lotion, Fragrance-Free  Curel Therapeutic Moisturizing Lotion, Original Formula  Eucerin Daily Replenishing Lotion  Eucerin Dry Skin Therapy Plus Alpha Hydroxy Crme   Eucerin Dry Skin Therapy Plus Alpha Hydroxy Lotion  Eucerin Original Crme  Eucerin Original Lotion  Eucerin Plus Crme Eucerin Plus Lotion  Eucerin TriLipid Replenishing Lotion  Keri Anti-Bacterial Hand Lotion  Keri Deep Conditioning Original Lotion Dry Skin Formula Softly Scented  Keri Deep Conditioning Original Lotion, Fragrance Free Sensitive Skin Formula  Keri Lotion Fast Absorbing Fragrance Free Sensitive Skin Formula  Keri Lotion Fast Absorbing Softly Scented Dry Skin Formula  Keri Original Lotion  Keri Skin Renewal Lotion Keri Silky Smooth Lotion  Keri Silky Smooth Sensitive Skin Lotion  Nivea Body Creamy Conditioning Oil  Nivea Body Extra Enriched Teacher, adult education Moisturizing Lotion Nivea Crme  Nivea Skin  Firming Lotion  NutraDerm 30 Skin Lotion  NutraDerm Skin Lotion  NutraDerm Therapeutic Skin Cream  NutraDerm Therapeutic Skin Lotion  ProShield Protective Hand Cream  Provon moisturizing lotion   Please read over the following fact sheets that you were given.

## 2024-02-01 NOTE — Assessment & Plan Note (Signed)
 Non-obstructive coronary artery disease with mid LCX stenosis of 40-60% identified in May 2022. He is asymptomatic with no chest pain to suggest angina.  Most recent LDL is optimal.  - Continue aspirin 81 mg daily - Continue Crestor 10 mg daily

## 2024-02-01 NOTE — Patient Instructions (Addendum)
 Medication Instructions:  Your physician has recommended you make the following change in your medication:   REDUCE the Amlodipine 5 mg to taking 1/2 tablet daily only  START Atenolol 25 mg taking 1 daily   *If you need a refill on your cardiac medications before your next appointment, please call your pharmacy*  Lab Work: None ordered  If you have labs (blood work) drawn today and your tests are completely normal, you will receive your results only by: MyChart Message (if you have MyChart) OR A paper copy in the mail If you have any lab test that is abnormal or we need to change your treatment, we will call you to review the results.  Testing/Procedures: Your physician has requested that you have an echocardiogram. Echocardiography is a painless test that uses sound waves to create images of your heart. It provides your doctor with information about the size and shape of your heart and how well your heart's chambers and valves are working. This procedure takes approximately one hour. There are no restrictions for this procedure. Please do NOT wear cologne, perfume, aftershave, or lotions (deodorant is allowed). Please arrive 15 minutes prior to your appointment time.  Please note: We ask at that you not bring children with you during ultrasound (echo/ vascular) testing. Due to room size and safety concerns, children are not allowed in the ultrasound rooms during exams. Our front office staff cannot provide observation of children in our lobby area while testing is being conducted. An adult accompanying a patient to their appointment will only be allowed in the ultrasound room at the discretion of the ultrasound technician under special circumstances. We apologize for any inconvenience. 3  Follow-Up: At [facility], you and your health needs are our priority.  As part of our continuing mission to provide you with exceptional heart care, our providers are all part of one team.  This  team includes your primary Cardiologist (physician) and Advanced Practice Providers or APPs (Physician Assistants and Nurse Practitioners) who all work together to provide you with the care you need, when you need it.  Your next appointment:   12 month(s)  Provider:   Charlton Haws, MD  or Tereso Newcomer, PA-C         We recommend signing up for the patient portal called "MyChart".  Sign up information is provided on this After Visit Summary.  MyChart is used to connect with patients for Virtual Visits (Telemedicine).  Patients are able to view lab/test results, encounter notes, upcoming appointments, etc.  Non-urgent messages can be sent to your provider as well.   To learn more about what you can do with MyChart, go to [email or web address].   Other Instructions       1st Floor: - Lobby - Registration  - Pharmacy  - Lab - Cafe  2nd Floor: - PV Lab - Diagnostic Testing (echo, CT, nuclear med)  3rd Floor: - Vacant  4th Floor: - TCTS (cardiothoracic surgery) - AFib Clinic - Structural Heart Clinic - Vascular Surgery  - Vascular Ultrasound  5th Floor: - [facility] (general and EP) - Clinical Pharmacy for coumadin, hypertension, lipid, weight-loss medications, and med management appointments    Valet parking services will be available as well.

## 2024-02-01 NOTE — Progress Notes (Signed)
 PCP - Sanda Linger, MD Cardiologist - Charlton Haws, MD  PPM/ICD - denies Device Orders - n/a Rep Notified - n/a  Chest x-ray - denies EKG - 11/03/2023 Stress Test - 05 /12/2020 ECHO - 06/03/2015 Cardiac Cath - 03/18/2021  Sleep Study - denies CPAP - n/a  Fasting Blood Sugar - no DM Checks Blood Sugar _____ times a day  Last dose of GLP1 agonist-  n/a GLP1 instructions: n/a  Blood Thinner Instructions: n/a Aspirin Instructions: to follow surgeon's instructions  ERAS Protcol -no; NPO PRE-SURGERY Ensure or G2- no  COVID TEST- n/a   Anesthesia review: yes, cardiac clearance on 02/01/2024 (hx of heart murmur and dysrhythmia.) Revonda Standard, [place] is aware.   Patient denies shortness of breath, fever, cough and chest pain at PAT appointment   All instructions explained to the patient, with a verbal understanding of the material. Patient agrees to go over the instructions while at home for a better understanding. Patient also instructed to self quarantine after being tested for COVID-19. The opportunity to ask questions was provided.

## 2024-02-02 DIAGNOSIS — M6281 Muscle weakness (generalized): Secondary | ICD-10-CM | POA: Diagnosis not present

## 2024-02-02 DIAGNOSIS — R269 Unspecified abnormalities of gait and mobility: Secondary | ICD-10-CM | POA: Diagnosis not present

## 2024-02-02 DIAGNOSIS — M47812 Spondylosis without myelopathy or radiculopathy, cervical region: Secondary | ICD-10-CM | POA: Diagnosis not present

## 2024-02-02 DIAGNOSIS — M542 Cervicalgia: Secondary | ICD-10-CM | POA: Diagnosis not present

## 2024-02-02 NOTE — Anesthesia Preprocedure Evaluation (Addendum)
 Anesthesia Evaluation  Patient identified by MRN, date of birth, ID band Patient awake    Reviewed: Allergy & Precautions, NPO status , Patient's Chart, lab work & pertinent test results  History of Anesthesia Complications Negative for: history of anesthetic complications  Airway Mallampati: I  TM Distance: >3 FB Neck ROM: Limited    Dental  (+) Teeth Intact, Dental Advisory Given, Implants   Pulmonary neg shortness of breath, neg sleep apnea, neg COPD, neg recent URI   breath sounds clear to auscultation       Cardiovascular hypertension, Pt. on medications (-) angina + CAD  (-) Past MI and (-) CHF + dysrhythmias  Rhythm:Regular  Left ventricle: The cavity size was normal. Wall thickness was    normal. Systolic function was vigorous. The estimated ejection    fraction was in the range of 65% to 70%. Wall motion was normal;    there were no regional wall motion abnormalities. Doppler    parameters are consistent with abnormal left ventricular    relaxation (grade 1 diastolic dysfunction). There was no evidence    of elevated ventricular filling pressure by Doppler parameters.  - Aortic valve: Structurally normal valve. There was no significant    regurgitation.  - Aortic root: The aortic root was normal in size.  - Ascending aorta: The ascending aorta was normal in size.  - Mitral valve: Structurally normal valve. There was no    regurgitation.  - Left atrium: The atrium was normal in size.  - Right atrium: The atrium was normal in size.  - Atrial septum: No defect or patent foramen ovale was identified.  - Tricuspid valve: Structurally normal valve. There was mild    regurgitation.  - Pulmonic valve: Structurally normal valve.  - Pulmonary arteries: Systolic pressure was within the normal    range.  - Inferior vena cava: The vessel was normal in size. The    respirophasic diameter changes were in the normal range (= 50%),     consistent with normal central venous pressure.     Neuro/Psych  PSYCHIATRIC DISORDERS Anxiety Depression     Neuromuscular disease    GI/Hepatic Neg liver ROS, hiatal hernia,GERD  ,,  Endo/Other  negative endocrine ROS    Renal/GU CRFRenal diseaseLab Results      Component                Value               Date                      NA                       138                 02/01/2024                K                        4.6                 02/01/2024                CO2                      26  02/01/2024                GLUCOSE                  105 (H)             02/01/2024                BUN                      19                  02/01/2024                CREATININE               1.22                02/01/2024                CALCIUM                   9.9                 02/01/2024                GFR                      59.66 (L)           11/03/2023                EGFR                     69                  03/13/2022                GFRNONAA                 >60                 02/01/2024                Musculoskeletal  (+) Arthritis ,    Abdominal   Peds  Hematology negative hematology ROS (+) Lab Results      Component                Value               Date                      WBC                      8.2                 02/01/2024                HGB                      14.7                02/01/2024                HCT                      45.0                02/01/2024  MCV                      94.1                02/01/2024                PLT                      286                 02/01/2024              Anesthesia Other Findings   Reproductive/Obstetrics                             Anesthesia Physical Anesthesia Plan  ASA: 3  Anesthesia Plan: General   Post-op Pain Management: Ofirmev  IV (intra-op)*   Induction: Intravenous  PONV Risk Score and Plan: 3 and Ondansetron  and  Dexamethasone   Airway Management Planned: Oral ETT  Additional Equipment: None  Intra-op Plan:   Post-operative Plan: Extubation in OR  Informed Consent: I have reviewed the patients History and Physical, chart, labs and discussed the procedure including the risks, benefits and alternatives for the proposed anesthesia with the patient or authorized representative who has indicated his/her understanding and acceptance.     Dental advisory given  Plan Discussed with: CRNA  Anesthesia Plan Comments: (PAT note written 02/02/2024 by Allison Zelenak, PA-C.  )       Anesthesia Quick Evaluation

## 2024-02-02 NOTE — Progress Notes (Signed)
 Anesthesia Chart Review:  Case: [id number] Date/Time: 02/14/24 0715   Procedure: POSTERIOR CERVICAL LAMINECTOMY WITH MET- RX (Left: Back) - MetRx Foraminotomy - C3-C4 - left, w/fluoro   Anesthesia type: General   Diagnosis: Radiculopathy, cervical [M54.12]   Pre-op diagnosis: Radiculopathy cervical   [facility] ROOM 20 / [facility]   Surgeons: Barnett Abu, MD       DISCUSSION: Patient is a 78 year old Peter [name] scheduled for the above procedure. He is a retired [employer] scientist.  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   For anesthesia history, he had accelerated HTN and tachycardia after neck surgery 2020, TMJ previously exacerbated by intubation, and reported itching/shakes after procedure in 2010.   Dr. Eden Emms is his primary cardiologist and is followed for non-obstructive CAD (2022). He had mild TR by echo in 2016. He was referred to EP Dr. Lalla Brothers and seen on 12/22/22 for history of palpations (previously PVCs and 12 beats SVT). No sustained arrhythmia.  No antiarrhythmics recommended at that time.  Since then he a new long term monitor in 09/2023 that showed 67 NSVT (longest 17 beats), atrial tachycardia, no afib, 4 beat episode of VT. Symptoms trigger by PACs/AT. He could not tolerate metoprolol succinate due to fatigue. He had follow-up and preoperative evaluation by Tereso Newcomer, PA-C on 02/01/24. Atenolol 25 mg prescribed and amlodipine reduced to 2.5 mg daily. No chest pain or HF symptoms. One year follow planned with updated echo. In regards to Preoperative CV risk, he wrote: "Mr. Arns's perioperative  risk of a major cardiac event is 0.9% according to the Revised Cardiac Risk Index (RCRI).  Therefore, he is at low risk for perioperative complications.    Recommendations: According to ACC/AHA guidelines, no further cardiovascular testing needed.  The patient may proceed to surgery at acceptable risk.   Antiplatelet and/or Anticoagulation Recommendations: Aspirin can be held for 7 days prior to his surgery.  Please resume Aspirin post operatively when it is felt to be safe from a bleeding standpoint."  Anesthesia team to evaluate on the day of surgery.    VS: BP 137/70   Pulse 87   Temp 36.8 C   Resp 18   Ht 5\' 9"  (1.753 m)   Wt 79.1 kg   SpO2 97%   BMI 25.75 kg/m    PROVIDERS: Etta Grandchild, MD his PCP Charlton Haws, MD his cardiologist Steffanie Dunn, MD is EP cardiologist.Follow-up as needed at 12/22/22 visit.    LABS: Labs reviewed: Acceptable for surgery. (all labs ordered are listed, but only abnormal results are displayed)  Labs Reviewed  BASIC METABOLIC PANEL WITH GFR - Abnormal; Notable for the following components:      Result Value   Glucose, Bld 105 (*)    All other components within normal limits  SURGICAL PCR SCREEN  CBC    IMAGES: CT C-spine 12/28/23: IMPRESSION: 1. Previous ACDF and fusion from C4 through C7 with solid union and sufficient patency of the canal and foramina. 2. C2-3: Uncovertebral hypertrophy on the right. Facet joint fusion on the right with osteophytic encroachment upon the foramen. Foraminal narrowing that could affect the right C3 nerve. 3. C3-4: Advanced facet arthropathy on the left with  erosions, quite possibly due to CPPD in this patient. Small uncovertebral osteophytes on the left. Foraminal stenosis on the left is primarily due to encroachment by facet osteophytes. Left C4 nerve compression or irritation could certainly occur.  MRI C-spine 12/19/23: IMPRESSION: 1. C2-3: Right-sided predominant uncovertebral hypertrophy  and chronic facet arthropathy, possibly with fusion or near fusion across that joint. Right foraminal narrowing that could affect the right C3 nerve. Stenosis appears similar to the study of last year. 2. C3-4: Bulging of the disc and uncovertebral prominence on the left. Chronic facet arthropathy on the left. This is showing considerably less edema than was seen last year, but the osteophytosis associated with this facet arthropathy appears worsened. Left foraminal stenosis could compress the left C4 nerve, and appears worsened. 3. C4 through C7: Previous ACDF with apparent solid union and sufficient patency of the canal and foramina.   EKG: 02/01/24: Sinus rhythm with occasional Premature ventricular complexes No significant change since last tracing Confirmed by Tereso Newcomer [phone]) on 02/01/2024 2:09:32 PM   CV: Long term monitor 10/03/23 - 10/17/23:  HR 48 - 181, average 76 bpm. 67 nonsustained SVT, longest 17 beats. Rhythm strip suggests AT. 1 nonsustained VT lasting 4 beats No atrial fibrillation Rare supraventricular and ventricular ectopy. Symptom trigger episodes correspond to sinus with PACs/AT.    [place] Abd Aorta/Renals 10/15/22: Summary:  - Largest Aortic Diameter: 2.2 cm. No evidence of abdominal aortic aneurysm.  - Renal:  Right: No evidence of right renal artery stenosis. RRV flow present.  Left:  No evidence of left renal artery stenosis. LRV flow present.    Cardiac cath 03/18/21: 40 to 60% mid circumflex.  Otherwise widely patent vessel. Short left main, widely patent. LAD is somewhat tortuous but widely patent.  Minimal plaquing is noted in the diagonal and LAD proper. Right coronary is dominant has minimal luminal irregularities and is widely patent. LVEF 55%.  LVEDP 18 mmHg.   RECOMMENDATIONS:  Nonobstructive CAD. Aggressive preventive therapy aiming for LDL less than 70.  Daily 81 mg aspirin.  Tight blood pressure control and glycemic control.   Echo  06/03/15: Impressions:  - Hyperdynamic LVEF. Abnormal relaxation with normal filling    pressures. Mild TR.    US Carotid 04/09/11: IMPRESSION:  No evidence for carotid artery stenosis.  Minimal atherosclerotic  disease in the carotid arteries.    Past Medical History:  Diagnosis Date   Allergic rhinitis    Anxiety    Cancer (HCC)    Melanoma -peri umbilical '02 or '03- no further problems. 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    Chest pain    Nuclear 2005, normal / coronary CTA 2008, slight mixed plaque, coronary calcium score 0.65, ejection fraction 55%, echo, March, 2008   Complication of anesthesia 2010   itching-not sure if oxycodone or anesthesia,and shakes. Right TMJ issue  chills in past   Dizziness    Dizziness with question of presyncope April 08, 2011   DJD (degenerative joint disease)    history DDD, fingers, knees, shoulders   Dysrhythmia 2025   "skipped beats"   Ejection fraction    EF 55%, echo, March, 2008.   Family history of Alzheimer's disease    GERD (gastroesophageal reflux disease)    Hearing impaired    bilateral hearing aids   Heart murmur    78 yrs old rheumatic fever    Heart murmur 2008   trivial tricusp regurg   History of colonic polyps    History  of hiatal hernia    History of melanoma    History of pseudogout    History of recent steroid use    10-10-15 tapering steroid use for tx. recent bronchitis.   History of rheumatic fever    HNP (herniated nucleus pulposus), cervical    Hypercholesteremia    borderline   Hypertension    Lumbar spondylosis with myelopathy 06/09/2013   Spondylosis    Tinnitus    chronic   TMJ click    right"was aggravated with last intubation"    Past Surgical History:  Procedure Laterality Date   ANTERIOR CERVICAL DECOMP/DISCECTOMY FUSION  2003   ANTERIOR CERVICAL DECOMP/DISCECTOMY FUSION N/A 05/18/2018   Procedure: ANTERIOR CERVICAL DECOMPRESSION/DISCECTOMY FUSION - CERVICAL FIVE-CERVICAL SIX, removal  of tether plate;  Surgeon: Shirlean Kelly, MD;  Location: [facility];  Service: Neurosurgery;  Laterality: N/A;   BACK SURGERY     x3   CARPAL TUNNEL RELEASE Right 10/31/2013   Procedure: RIGHT CARPAL TUNNEL RELEASE;  Surgeon: Wyn Forster., MD;  Location: [facility];  Service: Orthopedics;  Laterality: Right;   CARPAL TUNNEL RELEASE Left 11/28/2013   Procedure: LEFT CARPAL TUNNEL RELEASE;  Surgeon: Wyn Forster., MD;  Location: [facility];  Service: Orthopedics;  Laterality: Left;   CERVICAL DISCECTOMY     anterior with patellar allograft and plating   JOINT REPLACEMENT     right knee 3 years ago   LEFT HEART CATH AND CORONARY ANGIOGRAPHY N/A 03/18/2021   Procedure: LEFT HEART CATH AND CORONARY ANGIOGRAPHY;  Surgeon: Lyn Records, MD;  Location: [facility];  Service: Cardiovascular;  Laterality: N/A;   LUMBAR FUSION  12/14   MELANOMA EXCISION WITH SENTINEL LYMPH NODE BIOPSY  8/02   bilat ing node bx   ROTATOR CUFF REPAIR Left    08/13/11  08/27/2011   TOTAL KNEE ARTHROPLASTY Right 10/07/2015   Procedure: RIGHT TOTAL KNEE ARTHROPLASTY, with synovial tissue specimen;  Surgeon: Ollen Gross, MD;  Location: [facility];  Service: Orthopedics;  Laterality: Right;   TOTAL KNEE ARTHROPLASTY Left 06/16/2019   Procedure: LEFT TOTAL KNEE ARTHROPLASTY;  Surgeon: Kathryne Hitch, MD;  Location: [facility];  Service: Orthopedics;  Laterality: Left;    MEDICATIONS:  albuterol (VENTOLIN HFA) 108 (90 Base) MCG/ACT inhaler   alfuzosin (UROXATRAL) 10 MG 24 hr tablet   ALPRAZolam (XANAX) 0.5 MG tablet   amLODipine (NORVASC) 5 MG tablet   aspirin EC 81 MG tablet   atenolol (TENORMIN) 25 MG tablet   celecoxib (CELEBREX) 50 MG capsule   desoximetasone (TOPICORT) 0.25 % cream   indapamide (LOZOL) 1.25 MG tablet   Multiple Vitamin (MULTIVITAMIN WITH MINERALS) TABS tablet   olmesartan (BENICAR) 20 MG tablet   pantoprazole (PROTONIX) 40 MG tablet   rosuvastatin  (CRESTOR) 10 MG tablet   traMADol (ULTRAM) 50 MG tablet   No current facility-administered medications for this encounter.    Shonna Chock, PA-C Surgical Short Stay/Anesthesiology [facility] Phone [phone] [facility] Phone [phone] 02/02/2024 5:Peter PM

## 2024-02-02 NOTE — Progress Notes (Signed)
 Per pt the informed consent is incorrect. No fusion per pt. Dr. Verlee Rossetti office was contacted. Per Lowella Bandy, it will be changed.

## 2024-02-03 ENCOUNTER — Encounter: Payer: Self-pay | Admitting: Internal Medicine

## 2024-02-14 ENCOUNTER — Encounter (HOSPITAL_COMMUNITY): Payer: Self-pay | Admitting: Neurological Surgery

## 2024-02-14 ENCOUNTER — Ambulatory Visit (HOSPITAL_COMMUNITY): Payer: Self-pay | Admitting: Vascular Surgery

## 2024-02-14 ENCOUNTER — Observation Stay (HOSPITAL_COMMUNITY)
Admission: RE | Admit: 2024-02-14 | Discharge: 2024-02-14 | Disposition: A | Discharge status: Home / Self Care | Attending: Neurological Surgery | Admitting: Neurological Surgery

## 2024-02-14 ENCOUNTER — Ambulatory Visit (HOSPITAL_COMMUNITY)

## 2024-02-14 ENCOUNTER — Ambulatory Visit (HOSPITAL_BASED_OUTPATIENT_CLINIC_OR_DEPARTMENT_OTHER): Payer: Self-pay

## 2024-02-14 ENCOUNTER — Other Ambulatory Visit: Payer: Self-pay

## 2024-02-14 ENCOUNTER — Encounter (HOSPITAL_COMMUNITY)
Admission: RE | Disposition: A | Discharge status: Home / Self Care | Payer: Self-pay | Source: Home / Self Care | Attending: Neurological Surgery

## 2024-02-14 DIAGNOSIS — M5010 Cervical disc disorder with radiculopathy, unspecified cervical region: Principal | ICD-10-CM | POA: Diagnosis present

## 2024-02-14 DIAGNOSIS — M4722 Other spondylosis with radiculopathy, cervical region: Principal | ICD-10-CM | POA: Insufficient documentation

## 2024-02-14 DIAGNOSIS — I129 Hypertensive chronic kidney disease with stage 1 through stage 4 chronic kidney disease, or unspecified chronic kidney disease: Secondary | ICD-10-CM | POA: Diagnosis not present

## 2024-02-14 DIAGNOSIS — M5011 Cervical disc disorder with radiculopathy,  high cervical region: Secondary | ICD-10-CM | POA: Insufficient documentation

## 2024-02-14 DIAGNOSIS — Z79899 Other long term (current) drug therapy: Secondary | ICD-10-CM | POA: Insufficient documentation

## 2024-02-14 DIAGNOSIS — Z96653 Presence of artificial knee joint, bilateral: Secondary | ICD-10-CM | POA: Insufficient documentation

## 2024-02-14 DIAGNOSIS — M5412 Radiculopathy, cervical region: Secondary | ICD-10-CM | POA: Diagnosis not present

## 2024-02-14 DIAGNOSIS — I10 Essential (primary) hypertension: Secondary | ICD-10-CM

## 2024-02-14 DIAGNOSIS — Z7982 Long term (current) use of aspirin: Secondary | ICD-10-CM | POA: Insufficient documentation

## 2024-02-14 DIAGNOSIS — N1831 Chronic kidney disease, stage 3a: Secondary | ICD-10-CM | POA: Diagnosis not present

## 2024-02-14 DIAGNOSIS — Z981 Arthrodesis status: Secondary | ICD-10-CM | POA: Diagnosis not present

## 2024-02-14 DIAGNOSIS — Z85828 Personal history of other malignant neoplasm of skin: Secondary | ICD-10-CM | POA: Insufficient documentation

## 2024-02-14 DIAGNOSIS — I2510 Atherosclerotic heart disease of native coronary artery without angina pectoris: Secondary | ICD-10-CM

## 2024-02-14 HISTORY — PX: POSTERIOR CERVICAL LAMINECTOMY WITH MET- RX: SHX6035

## 2024-02-14 SURGERY — POSTERIOR CERVICAL LAMINECTOMY WITH MET- RX
Anesthesia: General | Site: Back | Laterality: Left

## 2024-02-14 MED ORDER — LIDOCAINE-EPINEPHRINE 1 %-1:100000 IJ SOLN
INTRAMUSCULAR | Status: AC
  Filled 2024-02-14: qty 1

## 2024-02-14 MED ORDER — ALBUTEROL SULFATE HFA 108 (90 BASE) MCG/ACT IN AERS: 2.0000 | INHALATION_SPRAY | Freq: Four times a day (QID) | RESPIRATORY_TRACT | Status: DC | PRN

## 2024-02-14 MED ORDER — IRBESARTAN 150 MG PO TABS: 150.0000 mg | ORAL_TABLET | Freq: Every day | ORAL | Status: DC

## 2024-02-14 MED ORDER — FENTANYL CITRATE (PF) 100 MCG/2ML IJ SOLN: 25.0000 ug | INTRAMUSCULAR | Status: DC | PRN

## 2024-02-14 MED ORDER — SODIUM CHLORIDE 0.9% FLUSH: 3.0000 mL | Freq: Two times a day (BID) | INTRAVENOUS | Status: DC

## 2024-02-14 MED ORDER — 0.9 % SODIUM CHLORIDE (POUR BTL) OPTIME
TOPICAL | Status: DC | PRN
  Administered 2024-02-14: 1000 mL

## 2024-02-14 MED ORDER — MIDAZOLAM HCL 2 MG/2ML IJ SOLN
INTRAMUSCULAR | Status: AC
  Filled 2024-02-14: qty 2

## 2024-02-14 MED ORDER — ONDANSETRON HCL 4 MG PO TABS: 4.0000 mg | ORAL_TABLET | Freq: Four times a day (QID) | ORAL | Status: DC | PRN

## 2024-02-14 MED ORDER — BUPIVACAINE HCL (PF) 0.5 % IJ SOLN
INTRAMUSCULAR | Status: AC
  Filled 2024-02-14: qty 30

## 2024-02-14 MED ORDER — ROCURONIUM BROMIDE 10 MG/ML (PF) SYRINGE
PREFILLED_SYRINGE | INTRAVENOUS | Status: DC | PRN
  Administered 2024-02-14: 80 mg via INTRAVENOUS

## 2024-02-14 MED ORDER — ALPRAZOLAM 0.5 MG PO TABS: 0.5000 mg | ORAL_TABLET | Freq: Two times a day (BID) | ORAL | Status: DC | PRN

## 2024-02-14 MED ORDER — CHLORHEXIDINE GLUCONATE 0.12 % MT SOLN
15.0000 mL | Freq: Once | OROMUCOSAL | Status: AC
  Administered 2024-02-14: 15 mL via OROMUCOSAL
  Filled 2024-02-14: qty 15

## 2024-02-14 MED ORDER — ACETAMINOPHEN 650 MG RE SUPP: 650.0000 mg | RECTAL | Status: DC | PRN

## 2024-02-14 MED ORDER — ROSUVASTATIN CALCIUM 5 MG PO TABS: 10.0000 mg | ORAL_TABLET | Freq: Every day | ORAL | Status: DC

## 2024-02-14 MED ORDER — MIDAZOLAM HCL 2 MG/2ML IJ SOLN
INTRAMUSCULAR | Status: DC | PRN
  Administered 2024-02-14: 2 mg via INTRAVENOUS

## 2024-02-14 MED ORDER — EPHEDRINE SULFATE-NACL 50-0.9 MG/10ML-% IV SOSY
PREFILLED_SYRINGE | INTRAVENOUS | Status: DC | PRN
  Administered 2024-02-14: 10 mg via INTRAVENOUS
  Administered 2024-02-14 (×3): 5 mg via INTRAVENOUS

## 2024-02-14 MED ORDER — MENTHOL 3 MG MT LOZG: 1.0000 | LOZENGE | OROMUCOSAL | Status: DC | PRN

## 2024-02-14 MED ORDER — HYDROMORPHONE HCL 2 MG PO TABS: 2.0000 mg | ORAL_TABLET | Freq: Four times a day (QID) | ORAL | 0 refills | Status: AC | PRN

## 2024-02-14 MED ORDER — FLEET ENEMA RE ENEM: 1.0000 | ENEMA | Freq: Once | RECTAL | Status: DC | PRN

## 2024-02-14 MED ORDER — INDAPAMIDE 1.25 MG PO TABS
1.2500 mg | ORAL_TABLET | Freq: Every day | ORAL | Status: DC
  Filled 2024-02-14: qty 1

## 2024-02-14 MED ORDER — AMLODIPINE BESYLATE 2.5 MG PO TABS
2.5000 mg | ORAL_TABLET | Freq: Every day | ORAL | Status: DC
  Filled 2024-02-14: qty 1

## 2024-02-14 MED ORDER — ACETAMINOPHEN 10 MG/ML IV SOLN: 1000.0000 mg | Freq: Once | INTRAVENOUS | Status: DC | PRN

## 2024-02-14 MED ORDER — DOCUSATE SODIUM 100 MG PO CAPS: 100.0000 mg | ORAL_CAPSULE | Freq: Two times a day (BID) | ORAL | Status: DC

## 2024-02-14 MED ORDER — PHENYLEPHRINE HCL-NACL 20-0.9 MG/250ML-% IV SOLN
INTRAVENOUS | Status: DC | PRN
  Administered 2024-02-14: 40 ug/min via INTRAVENOUS

## 2024-02-14 MED ORDER — BISACODYL 10 MG RE SUPP: 10.0000 mg | Freq: Every day | RECTAL | Status: DC | PRN

## 2024-02-14 MED ORDER — CHLORHEXIDINE GLUCONATE CLOTH 2 % EX PADS
6.0000 | MEDICATED_PAD | Freq: Once | CUTANEOUS | Status: DC
Start: 2024-02-14 — End: 2024-02-14

## 2024-02-14 MED ORDER — CHLORHEXIDINE GLUCONATE CLOTH 2 % EX PADS: 6.0000 | MEDICATED_PAD | Freq: Once | CUTANEOUS | Status: DC

## 2024-02-14 MED ORDER — THROMBIN 5000 UNITS EX SOLR
OROMUCOSAL | Status: DC | PRN
  Administered 2024-02-14: 5 mL via TOPICAL

## 2024-02-14 MED ORDER — ALUM & MAG HYDROXIDE-SIMETH 200-200-20 MG/5ML PO SUSP: 30.0000 mL | Freq: Four times a day (QID) | ORAL | Status: DC | PRN

## 2024-02-14 MED ORDER — ATENOLOL 25 MG PO TABS: 25.0000 mg | ORAL_TABLET | Freq: Two times a day (BID) | ORAL | Status: DC

## 2024-02-14 MED ORDER — DEXAMETHASONE SODIUM PHOSPHATE 10 MG/ML IJ SOLN
INTRAMUSCULAR | Status: DC | PRN
Start: 2024-02-14 — End: 2024-02-14
  Administered 2024-02-14: 10 mg via INTRAVENOUS

## 2024-02-14 MED ORDER — DIAZEPAM 5 MG PO TABS: 5.0000 mg | ORAL_TABLET | Freq: Four times a day (QID) | ORAL | 0 refills | Status: DC | PRN

## 2024-02-14 MED ORDER — CEFAZOLIN SODIUM-DEXTROSE 2-4 GM/100ML-% IV SOLN
2.0000 g | INTRAVENOUS | Status: AC
  Administered 2024-02-14: 2 g via INTRAVENOUS
  Filled 2024-02-14: qty 100

## 2024-02-14 MED ORDER — OXYCODONE-ACETAMINOPHEN 5-325 MG PO TABS: 1.0000 | ORAL_TABLET | Freq: Four times a day (QID) | ORAL | Status: DC | PRN

## 2024-02-14 MED ORDER — PROPOFOL 10 MG/ML IV BOLUS
INTRAVENOUS | Status: AC
  Filled 2024-02-14: qty 20

## 2024-02-14 MED ORDER — SUGAMMADEX SODIUM 200 MG/2ML IV SOLN
INTRAVENOUS | Status: DC | PRN
Start: 2024-02-14 — End: 2024-02-14
  Administered 2024-02-14: 200 mg via INTRAVENOUS

## 2024-02-14 MED ORDER — ORAL CARE MOUTH RINSE: 15.0000 mL | Freq: Once | OROMUCOSAL | Status: AC

## 2024-02-14 MED ORDER — CEFAZOLIN SODIUM-DEXTROSE 2-4 GM/100ML-% IV SOLN
2.0000 g | Freq: Three times a day (TID) | INTRAVENOUS | Status: DC
  Administered 2024-02-14: 2 g via INTRAVENOUS
  Filled 2024-02-14: qty 100

## 2024-02-14 MED ORDER — ALFUZOSIN HCL ER 10 MG PO TB24
10.0000 mg | ORAL_TABLET | Freq: Every evening | ORAL | Status: DC
  Filled 2024-02-14: qty 1

## 2024-02-14 MED ORDER — POLYETHYLENE GLYCOL 3350 17 G PO PACK: 17.0000 g | PACK | Freq: Every day | ORAL | Status: DC | PRN

## 2024-02-14 MED ORDER — PROPOFOL 10 MG/ML IV BOLUS
INTRAVENOUS | Status: DC | PRN
  Administered 2024-02-14: 50 mg via INTRAVENOUS
  Administered 2024-02-14: 130 mg via INTRAVENOUS

## 2024-02-14 MED ORDER — BACITRACIN ZINC 500 UNIT/GM EX OINT
TOPICAL_OINTMENT | CUTANEOUS | Status: AC
  Filled 2024-02-14: qty 28.35

## 2024-02-14 MED ORDER — ONDANSETRON HCL 4 MG/2ML IJ SOLN
INTRAMUSCULAR | Status: DC | PRN
Start: 2024-02-14 — End: 2024-02-14
  Administered 2024-02-14: 4 mg via INTRAVENOUS

## 2024-02-14 MED ORDER — ONDANSETRON HCL 4 MG/2ML IJ SOLN: 4.0000 mg | Freq: Four times a day (QID) | INTRAMUSCULAR | Status: DC | PRN

## 2024-02-14 MED ORDER — OXYCODONE HCL 5 MG PO TABS: 5.0000 mg | ORAL_TABLET | Freq: Once | ORAL | Status: DC | PRN

## 2024-02-14 MED ORDER — PHENOL 1.4 % MT LIQD: 1.0000 | OROMUCOSAL | Status: DC | PRN

## 2024-02-14 MED ORDER — SODIUM CHLORIDE 0.9% FLUSH: 3.0000 mL | INTRAVENOUS | Status: DC | PRN

## 2024-02-14 MED ORDER — SENNA 8.6 MG PO TABS: 1.0000 | ORAL_TABLET | Freq: Two times a day (BID) | ORAL | Status: DC

## 2024-02-14 MED ORDER — LIDOCAINE-EPINEPHRINE 1 %-1:100000 IJ SOLN
INTRAMUSCULAR | Status: DC | PRN
  Administered 2024-02-14: 5 mL

## 2024-02-14 MED ORDER — ALBUTEROL SULFATE (2.5 MG/3ML) 0.083% IN NEBU: 2.5000 mg | INHALATION_SOLUTION | Freq: Four times a day (QID) | RESPIRATORY_TRACT | Status: DC | PRN

## 2024-02-14 MED ORDER — THROMBIN 5000 UNITS EX KIT
PACK | CUTANEOUS | Status: AC
  Filled 2024-02-14: qty 1

## 2024-02-14 MED ORDER — PANTOPRAZOLE SODIUM 40 MG PO TBEC: 40.0000 mg | DELAYED_RELEASE_TABLET | Freq: Two times a day (BID) | ORAL | Status: DC

## 2024-02-14 MED ORDER — METHOCARBAMOL 500 MG PO TABS
500.0000 mg | ORAL_TABLET | Freq: Four times a day (QID) | ORAL | Status: DC | PRN
  Administered 2024-02-14: 500 mg via ORAL
  Filled 2024-02-14: qty 1

## 2024-02-14 MED ORDER — BUPIVACAINE HCL (PF) 0.5 % IJ SOLN
INTRAMUSCULAR | Status: DC | PRN
  Administered 2024-02-14: 9 mL
  Administered 2024-02-14: 5 mL

## 2024-02-14 MED ORDER — METHOCARBAMOL 1000 MG/10ML IJ SOLN: 500.0000 mg | Freq: Four times a day (QID) | INTRAMUSCULAR | Status: DC | PRN

## 2024-02-14 MED ORDER — OXYCODONE HCL 5 MG/5ML PO SOLN: 5.0000 mg | Freq: Once | ORAL | Status: DC | PRN

## 2024-02-14 MED ORDER — PHENYLEPHRINE 80 MCG/ML (10ML) SYRINGE FOR IV PUSH (FOR BLOOD PRESSURE SUPPORT)
PREFILLED_SYRINGE | INTRAVENOUS | Status: DC | PRN
Start: 2024-02-14 — End: 2024-02-14
  Administered 2024-02-14: 160 ug via INTRAVENOUS
  Administered 2024-02-14 (×2): 80 ug via INTRAVENOUS
  Administered 2024-02-14 (×2): 160 ug via INTRAVENOUS
  Administered 2024-02-14 (×2): 80 ug via INTRAVENOUS

## 2024-02-14 MED ORDER — SODIUM CHLORIDE 0.9 % IV SOLN
250.0000 mL | INTRAVENOUS | Status: DC
  Administered 2024-02-14: 250 mL via INTRAVENOUS

## 2024-02-14 MED ORDER — HYDROMORPHONE HCL 1 MG/ML IJ SOLN: 0.5000 mg | INTRAMUSCULAR | Status: DC | PRN

## 2024-02-14 MED ORDER — LACTATED RINGERS IV SOLN
INTRAVENOUS | Status: DC
Start: 2024-02-14 — End: 2024-02-14

## 2024-02-14 MED ORDER — ACETAMINOPHEN 325 MG PO TABS: 650.0000 mg | ORAL_TABLET | ORAL | Status: DC | PRN

## 2024-02-14 MED ORDER — FENTANYL CITRATE (PF) 250 MCG/5ML IJ SOLN
INTRAMUSCULAR | Status: AC
  Filled 2024-02-14: qty 5

## 2024-02-14 MED ORDER — FENTANYL CITRATE (PF) 250 MCG/5ML IJ SOLN
INTRAMUSCULAR | Status: DC | PRN
  Administered 2024-02-14 (×2): 50 ug via INTRAVENOUS

## 2024-02-14 MED ORDER — LIDOCAINE 2% (20 MG/ML) 5 ML SYRINGE
INTRAMUSCULAR | Status: DC | PRN
  Administered 2024-02-14: 80 mg via INTRAVENOUS

## 2024-02-14 SURGICAL SUPPLY — 47 items
BAG COUNTER SPONGE SURGICOUNT (BAG) ×2 IMPLANT
BAND RUBBER #18 3X1/16 STRL (MISCELLANEOUS) ×4 IMPLANT
BLADE CLIPPER SURG (BLADE) IMPLANT
BLADE SURG 15 STRL LF DISP TIS (BLADE) ×2 IMPLANT
BUR SURG MATCH HEAD 2.5X12.5 (BUR) IMPLANT
BUR SURGICAL HEAD PROX 3X12.5 (BURR) ×2 IMPLANT
CANISTER SUCT 3000ML PPV (MISCELLANEOUS) ×2 IMPLANT
COVER MAYO STAND STRL (DRAPES) IMPLANT
DERMABOND ADVANCED .7 DNX12 (GAUZE/BANDAGES/DRESSINGS) ×4 IMPLANT
DERMABOND ADVANCED .7 DNX6 (GAUZE/BANDAGES/DRESSINGS) IMPLANT
DRAPE C-ARM 42X72 X-RAY (DRAPES) IMPLANT
DRAPE LAPAROTOMY 100X72 PEDS (DRAPES) ×2 IMPLANT
DRAPE MICROSCOPE SLANT 54X150 (MISCELLANEOUS) ×2 IMPLANT
DURAPREP 6ML APPLICATOR 50/CS (WOUND CARE) ×2 IMPLANT
ELECTRODE BLDE 4.0 EZ CLN MEGD (MISCELLANEOUS) ×2 IMPLANT
ELECTRODE REM PT RTRN 9FT ADLT (ELECTROSURGICAL) ×2 IMPLANT
GAUZE 4X4 16PLY ~~LOC~~+RFID DBL (SPONGE) IMPLANT
GLOVE BIOGEL PI IND STRL 8.5 (GLOVE) ×2 IMPLANT
GLOVE ECLIPSE 8.5 STRL (GLOVE) ×2 IMPLANT
GLOVE EXAM NITRILE XL STR (GLOVE) IMPLANT
GLOVE SURG SS PI 6.5 STRL IVOR (GLOVE) IMPLANT
GOWN STRL REUS W/ TWL LRG LVL3 (GOWN DISPOSABLE) IMPLANT
GOWN STRL REUS W/ TWL XL LVL3 (GOWN DISPOSABLE) IMPLANT
GOWN STRL REUS W/TWL 2XL LVL3 (GOWN DISPOSABLE) ×2 IMPLANT
HEMOSTAT POWDER KIT SURGIFOAM (HEMOSTASIS) ×2 IMPLANT
KIT BASIN OR (CUSTOM PROCEDURE TRAY) ×2 IMPLANT
KIT TURNOVER KIT B (KITS) ×2 IMPLANT
NDL HYPO 18GX1.5 BLUNT FILL (NEEDLE) IMPLANT
NDL HYPO 22X1.5 SAFETY MO (MISCELLANEOUS) ×2 IMPLANT
NDL SPNL 20GX3.5 QUINCKE YW (NEEDLE) IMPLANT
NEEDLE HYPO 18GX1.5 BLUNT FILL (NEEDLE) IMPLANT
NEEDLE HYPO 22X1.5 SAFETY MO (MISCELLANEOUS) ×1 IMPLANT
NEEDLE SPNL 20GX3.5 QUINCKE YW (NEEDLE) IMPLANT
NS IRRIG 1000ML POUR BTL (IV SOLUTION) ×2 IMPLANT
PACK LAMINECTOMY NEURO (CUSTOM PROCEDURE TRAY) ×2 IMPLANT
PAD ARMBOARD POSITIONER FOAM (MISCELLANEOUS) ×6 IMPLANT
PATTIES SURGICAL .5 X.5 (GAUZE/BANDAGES/DRESSINGS) IMPLANT
PIN MAYFIELD SKULL DISP (PIN) ×2 IMPLANT
SPIKE FLUID TRANSFER (MISCELLANEOUS) ×2 IMPLANT
SPONGE SURGIFOAM ABS GEL SZ50 (HEMOSTASIS) IMPLANT
SUT VIC AB 3-0 SH 8-18 (SUTURE) ×2 IMPLANT
SUT VIC AB 4-0 RB1 18 (SUTURE) ×2 IMPLANT
SYR 5ML LL (SYRINGE) IMPLANT
TOWEL GREEN STERILE (TOWEL DISPOSABLE) ×2 IMPLANT
TOWEL GREEN STERILE FF (TOWEL DISPOSABLE) ×2 IMPLANT
TUBING FEATHERFLOW (TUBING) ×2 IMPLANT
WATER STERILE IRR 1000ML POUR (IV SOLUTION) ×2 IMPLANT

## 2024-02-14 NOTE — Transfer of Care (Signed)
 Immediate Anesthesia Transfer of Care Note  Patient: Peter Hammock, MD  Procedure(s) Performed: POSTERIOR CERVICAL FORAMINOTOMY WITH MET- RX CERVICAL THREE-CERVICAL FOUR LEFT (Left: Back)  Patient Location: PACU  Anesthesia Type:General  Level of Consciousness: awake, alert , oriented, and patient cooperative  Airway & Oxygen Therapy: Patient Spontanous Breathing and Patient connected to face mask oxygen  Post-op Assessment: Report given to RN and Post -op Vital signs reviewed and stable  Post vital signs: Reviewed and stable  Last Vitals:  Vitals Value Taken Time  BP 132/68 02/14/24 1015  Temp    Pulse 79 02/14/24 1019  Resp 15 02/14/24 1019  SpO2 97 % 02/14/24 1019  Vitals shown include unfiled device data.  Last Pain:  Vitals:   02/14/24 0636  TempSrc:   PainSc: 0-No pain         Complications: No notable events documented.

## 2024-02-14 NOTE — Op Note (Signed)
 Date of surgery: 02/14/2024 Preoperative diagnosis: Cervical spondylosis left C3-C4 with radiculopathy Postoperative diagnosis: Same Procedure: Posterior cervical laminotomy and foraminotomies C3-4 left using Metrix retractor and operating microscope. Surgeon: Elna Haggis Anesthesia: General endotracheal Indications: Peter [name] is a 78 year old individual who has had significant radicular pain on the left side related to his C4 nerve root stenosis.  He has CPPD and the left-sided facet joint is particularly involved creating severe and high-grade stenosis from the C4 nerve root.  A posterior decompression has been advised.  Procedure: Patient was brought to the operating room supine on the stretcher.  After the smooth induction of general endotracheal anesthesia, he was carefully turned prone.  He was in the 3 pin headrest.  The head was secured to the operating table.  The back of the neck was then prepped with alcohol DuraPrep and draped in a sterile fashion.  Fluoroscopic imaging was used to localize the C3-4 lamina and facet complex on the left.  An incision was created over this area after infiltrating with lidocaine  1% with epinephrine  mixed 50-50 with half percent Marcaine .  A K wire was then placed over the interlaminar space at L3-4 on the left side.  A 1 then technique was used to dissect the soft tissues and gradually this area was dilated to the 18 mm diameter.  An 18 mm x 5 cm Metrix retractor was then placed into the wound and the inner tubes were removed.  The retractor was secured to the operating table with a clamp and the operating microscope was brought in and the soft tissues overlying the interlaminar space were cleared with a monopolar cautery.  With the bony landmarks being identified a laminotomy was created removing the inferior margin lamina of C3 out to the lateral aspect near the facet.  A partial facetectomy was then performed superior arch of C4 was removed also out to the facet  and the facet was noted to be severely hypertrophied.  There were dense adhesions to the underlying dura which had to be carefully dissected there is epidural veins in this region which seem to bleed rather profusely, these were cauterized and divided.  Ultimately the path of the C4 nerve root was uncovered and there was substantial fibrotic material from the medial aspect of the facet joint itself that was densely attached to the nerve root this was carefully dissected free.  The dissection was carried out under the facet joint creating a partial facetectomy that included about half of the facet joint.  In the end however the C4 nerve root was decompressed both ventrally and dorsally.  Once this was completed hemostasis was carefully achieved and then the retractor was removed and additional 10 cc of half percent Marcaine  was injected into the paraspinous fascia and the muscular layers.  The muscle was then closed with 3-0 Vicryl in interrupted fashion and 4-0 Vicryl was used in the subcuticular skin.  Dermabond was used on the skin.  Blood loss for the procedure was estimated at 50 cc.

## 2024-02-14 NOTE — Evaluation (Signed)
 Occupational Therapy Evaluation and DC Summary  Patient Details Name: Peter DUDDY, MD MRN: [id number] DOB: 04-02-1946 Today's Date: 02/14/2024   History of Present Illness   Pt is a 78 yo male admitted to [facility] on 02/14/24 for elective decompression of C3-C4. PMH anxiety, melanoma, DJD, GERD, HTN, ACDF (2019), TKA R/L.     Clinical Impressions Pt admitted for above, PTA pt reports being ind with ADLs/iADLs and driving, notes increasing challenges with donning socks throughout the years. Pt currently ambulatory in hall no AD with supervision, educated pt on C spine precautions and pt demonstrating good use of cervical body mechanics to completion functional tasks/activities. 2 verbal cues needed for pt to keep head in upright position during full body dressing. Pt completing ADLs with min A to supervision, educated him on AE to assist with ADLs prn. Pt has no further acute skilled OT needs and possess good understanding of all education. No post acute OT recommended.      If plan is discharge home, recommend the following:   Assistance with cooking/housework     Functional Status Assessment   Patient has had a recent decline in their functional status and/or demonstrates limited ability to make significant improvements in function in a reasonable and predictable amount of time     Equipment Recommendations   None recommended by OT     Recommendations for Other Services         Precautions/Restrictions   Precautions Precautions: Fall;Cervical (low fall risk) Precaution Booklet Issued: Yes (comment) Recall of Precautions/Restrictions: Intact Required Braces or Orthoses:  (no brace needed) Restrictions Weight Bearing Restrictions Per Provider Order: No     Mobility Bed Mobility Overal bed mobility: Modified Independent             General bed mobility comments: Pt demonstrated good use of log roll    Transfers Overall transfer level: Modified  independent Equipment used: None                      Balance Overall balance assessment: Mild deficits observed, not formally tested                                         ADL either performed or assessed with clinical judgement   ADL Overall ADL's : Needs assistance/impaired Eating/Feeding: Independent;Sitting   Grooming: Standing;Supervision/safety   Upper Body Bathing: Modified independent;Standing   Lower Body Bathing: Sitting/lateral leans;Minimal assistance Lower Body Bathing Details (indicate cue type and reason): discussed use of LH Sponge for bathing if needed; owns HH hose Upper Body Dressing : Sitting;Modified independent   Lower Body Dressing: Sitting/lateral leans;Minimal assistance Lower Body Dressing Details (indicate cue type and reason): Educated pt on how to don socks using bed support, demonstrated use of sockaid. 1 vc to avoid handing head in dependent position during LBD, discussed using reacher to assist prn Toilet Transfer: Supervision/safety;Ambulation   Toileting- Clothing Manipulation and Hygiene: Sitting/lateral lean;Supervision/safety       Functional mobility during ADLs: Supervision/safety General ADL Comments: Educated pt on sink strategies to maintain c spine precautions, negotiated 8 steps with supervision and 1 VC to use feet to feel stairs and avoid hanging head down     Vision Baseline Vision/History: 1 Wears glasses Patient Visual Report: No change from baseline Vision Assessment?: No apparent visual deficits     Perception  Praxis         Pertinent Vitals/Pain Pain Assessment Pain Assessment: 0-10 Pain Score: 1  Pain Location: neck, op site Pain Descriptors / Indicators: Aching, Sore Pain Intervention(s): Monitored during session     Extremity/Trunk Assessment Upper Extremity Assessment Upper Extremity Assessment: Overall WFL for tasks assessed (ROM WFL)   Lower Extremity  Assessment Lower Extremity Assessment: Overall WFL for tasks assessed (limited LLE knee flexion from past TKA)   Cervical / Trunk Assessment Cervical / Trunk Assessment: Neck Surgery   Communication Communication Communication: No apparent difficulties   Cognition Arousal: Alert Behavior During Therapy: WFL for tasks assessed/performed Cognition: No apparent impairments                               Following commands: Intact       Cueing  General Comments   Cueing Techniques: Verbal cues  incision c/d/i   Exercises     Shoulder Instructions      Home Living Family/patient expects to be discharged to:: Private residence Living Arrangements: Spouse/significant other Available Help at Discharge: Family;Available 24 hours/day Type of Home: House Home Access: Stairs to enter [employer] of Steps: 4-5 Entrance Stairs-Rails: Right Home Layout: One level     Bathroom Shower/Tub: Producer, television/film/video: Handicapped height     Home Equipment: Agricultural consultant (2 wheels);Cane - single point;Adaptive equipment;Shower seat;Hand held Clinical biochemist: Reacher;Long-handled Building services engineer        Prior Functioning/Environment Prior Level of Function : Independent/Modified Independent;Driving             Mobility Comments: no AD ADLs Comments: ind    OT Problem List: Pain   OT Treatment/Interventions:        OT Goals(Current goals can be found in the care plan section)   Acute Rehab OT Goals Patient Stated Goal: to go home OT Goal Formulation: With patient Time For Goal Achievement: 02/28/24 Potential to Achieve Goals: Good   OT Frequency:       Co-evaluation              AM-PAC OT "6 Clicks" Daily Activity     Outcome Measure Help from another person eating meals?: None Help from another person taking care of personal grooming?: A Little Help from another person toileting, which includes using toliet,  bedpan, or urinal?: A Little Help from another person bathing (including washing, rinsing, drying)?: A Little Help from another person to put on and taking off regular upper body clothing?: None Help from another person to put on and taking off regular lower body clothing?: A Little 6 Click Score: 20   End of Session Nurse Communication: Mobility status  Activity Tolerance: Patient tolerated treatment well Patient left: in bed;with call bell/phone within reach;with family/visitor present  OT Visit Diagnosis: Pain Pain - Right/Left:  (neck)                Time: 8295-6213 OT Time Calculation (min): 28 min Charges:  OT General Charges $OT Visit: 1 Visit OT Evaluation $OT Eval Low Complexity: 1 Low OT Treatments $Self Care/Home Management : 8-22 mins  02/14/2024  AB, OTR/L  Acute Rehabilitation Services  Office: [phone]   Jorene New 02/14/2024, 2:29 PM

## 2024-02-14 NOTE — H&P (Signed)
 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   Chief Complaint: Neck and left shoulder pain. 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  He has a cervical fusion from C4 down to C7.  He has developed significant spondylitic changes with left-sided foraminal stenosis at C3-C4.  He has tried conservative measures with physical therapy a number of cortisone injections done variously but despite this he has substantial left cervical radicular pain.  Has been advised regarding the need for surgical decompression at the C3-4 level on the left.  Past Medical History:  Diagnosis Date   Allergic rhinitis    Anxiety    Cancer (HCC)    Melanoma -peri umbilical '02 or '03- no further problems. 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    Chest pain    Nuclear 2005, normal / coronary CTA 2008, slight mixed plaque, coronary calcium  score 0.65, ejection fraction 55%, echo, March, 2008   Complication of anesthesia 2010   itching-not sure if oxycodone  or anesthesia,and shakes. Right TMJ issue  chills in past   Dizziness    Dizziness with question of presyncope April 08, 2011   DJD (degenerative joint disease)    history DDD, fingers, knees, shoulders   Dysrhythmia 2025   "skipped beats"   Ejection fraction    EF 55%, echo, March, 2008.   Family history of Alzheimer's disease    GERD (gastroesophageal reflux disease)    Hearing impaired    bilateral hearing aids   Heart murmur    78 yrs old rheumatic fever    Heart murmur 2008   trivial tricusp regurg   History of colonic polyps    History of hiatal hernia    History of melanoma    History of pseudogout    History of recent steroid use    10-10-15 tapering steroid use for tx. recent bronchitis.   History of rheumatic fever    HNP (herniated nucleus pulposus), cervical    Hypercholesteremia    borderline   Hypertension    Lumbar spondylosis with myelopathy 06/09/2013   Spondylosis    Tinnitus     chronic   TMJ click    right"was aggravated with last intubation"    Past Surgical History:  Procedure Laterality Date   ANTERIOR CERVICAL DECOMP/DISCECTOMY FUSION  2003   ANTERIOR CERVICAL DECOMP/DISCECTOMY FUSION N/A 05/18/2018   Procedure: ANTERIOR CERVICAL DECOMPRESSION/DISCECTOMY FUSION - CERVICAL FIVE-CERVICAL SIX, removal of tether plate;  Surgeon: Yvonna Herder, MD;  Location: [facility] OR;  Service: Neurosurgery;  Laterality: N/A;   BACK SURGERY     x3   CARPAL TUNNEL RELEASE Right 10/31/2013   Procedure: RIGHT CARPAL TUNNEL RELEASE;  Surgeon: Amelie Baize., MD;  Location: [facility];  Service: Orthopedics;  Laterality: Right;   CARPAL TUNNEL RELEASE Left 11/28/2013   Procedure: LEFT CARPAL TUNNEL RELEASE;  Surgeon: Amelie Baize., MD;  Location: [facility];  Service: Orthopedics;  Laterality: Left;   CERVICAL DISCECTOMY     anterior with patellar allograft and plating   JOINT REPLACEMENT     right knee 3 years ago   LEFT HEART CATH AND CORONARY ANGIOGRAPHY N/A 03/18/2021   Procedure: LEFT HEART CATH AND CORONARY ANGIOGRAPHY;  Surgeon: Arty Binning, MD;  Location: [facility];  Service: Cardiovascular;  Laterality: N/A;   LUMBAR FUSION  12/14   MELANOMA EXCISION WITH SENTINEL LYMPH NODE BIOPSY  8/02  bilat ing node bx   ROTATOR CUFF REPAIR Left    08/13/11  08/27/2011   TOTAL KNEE ARTHROPLASTY Right 10/07/2015   Procedure: RIGHT TOTAL KNEE ARTHROPLASTY, with synovial tissue specimen;  Surgeon: Liliane Rei, MD;  Location: [facility];  Service: Orthopedics;  Laterality: Right;   TOTAL KNEE ARTHROPLASTY Left 06/16/2019   Procedure: LEFT TOTAL KNEE ARTHROPLASTY;  Surgeon: Arnie Lao, MD;  Location: [facility];  Service: Orthopedics;  Laterality: Left;    Family History  Problem Relation Age of Onset   Alzheimer's disease Father    Alzheimer's disease Mother    Social History:  reports that he has never smoked. He has never  used smokeless tobacco. He reports current alcohol use of about 1.0 standard drink of alcohol per week. He reports that he does not use drugs.  Allergies:  Allergies  Allergen Reactions   Gentamycin [Gentamicin] Hives and Itching   Turmeric Other (See Comments)    Cardiac arrythmias   Bacitracin  Rash   Hydrocodone  Rash   Moxifloxacin Rash    pt had severe red rash- he thought from Avelox Rx....   Neosporin [Neomycin-Bacitracin  Zn-Polymyx] Rash   Other Other (See Comments)    Redness  bandaide   Polymyxin B Other (See Comments) and Rash   Promethazine  Other (See Comments)    Restless legs   Tape Dermatitis    bandaid-skin reaction   Triamcinolone Acetonide Itching and Rash          Medications Prior to Admission  Medication Sig Dispense Refill   alfuzosin  (UROXATRAL ) 10 MG 24 hr tablet TAKE 1 TABLET BY MOUTH EVERY DAY IN THE EVENING 90 tablet 0   ALPRAZolam  (XANAX ) 0.5 MG tablet Take 1 tablet (0.5 mg total) by mouth 2 (two) times daily as needed for anxiety. 180 tablet 0   amLODipine  (NORVASC ) 5 MG tablet Take 0.5 tablets (2.5 mg total) by mouth daily. 45 tablet 3   aspirin  EC 81 MG tablet Take 81 mg by mouth at bedtime. Swallow whole.     atenolol  (TENORMIN ) 25 MG tablet Take 1 tablet (25 mg total) by mouth 2 (two) times daily. 90 tablet 3   celecoxib  (CELEBREX ) 50 MG capsule Take 1 capsule (50 mg total) by mouth 2 (two) times daily. 180 capsule 0   desoximetasone (TOPICORT) 0.25 % cream Apply 1 Application topically every evening.     indapamide  (LOZOL ) 1.25 MG tablet TAKE 1 TABLET BY MOUTH DAILY. 90 tablet 0   Multiple Vitamin (MULTIVITAMIN WITH MINERALS) TABS tablet Take 1 tablet by mouth daily.     olmesartan  (BENICAR ) 20 MG tablet TAKE 1 TABLET BY MOUTH EVERY DAY 90 tablet 0   pantoprazole  (PROTONIX ) 40 MG tablet TAKE 1 TABLET BY MOUTH EVERY DAY 30 MINUTES BEFORE EVENING MEALS 90 tablet 1   rosuvastatin  (CRESTOR ) 10 MG tablet TAKE 1 TABLET BY MOUTH EVERYDAY AT BEDTIME 90  tablet 1   traMADol  (ULTRAM ) 50 MG tablet Take 1 tablet (50 mg total) by mouth every 12 (twelve) hours as needed. 180 tablet 0   albuterol  (VENTOLIN  HFA) 108 (90 Base) MCG/ACT inhaler Inhale 2 puffs into the lungs every 6 (six) hours as needed for wheezing or shortness of breath. 8 g 2    No results found for this or any previous visit (from the past 48 hours). No results found.  Review of Systems  Constitutional:  Positive for activity change.  Musculoskeletal:  Positive for myalgias and neck pain.  Neurological:  Positive for weakness.  All other systems reviewed and are negative.   Blood pressure 127/70, pulse 74, temperature 98 F (36.7 C), temperature source Oral, height 5\' 9"  (1.753 m), weight 78 kg, SpO2 97%. Physical Exam Constitutional:      Appearance: Normal appearance. He is normal weight.  HENT:     Head: Normocephalic and atraumatic.     Right Ear: Tympanic membrane, ear canal and external ear normal.     Left Ear: Tympanic membrane, ear canal and external ear normal.     Nose: Nose normal.     Mouth/Throat:     Mouth: Mucous membranes are moist.     Pharynx: Oropharynx is clear.  Eyes:     Extraocular Movements: Extraocular movements intact.     Conjunctiva/sclera: Conjunctivae normal.     Pupils: Pupils are equal, round, and reactive to light.  Neck:     Comments: Decreased range of motion turning 30 degrees to either side positive Spurling maneuver on the left. Cardiovascular:     Rate and Rhythm: Normal rate and regular rhythm.     Pulses: Normal pulses.     Heart sounds: Normal heart sounds.  Pulmonary:     Effort: Pulmonary effort is normal.     Breath sounds: Normal breath sounds.  Abdominal:     General: Abdomen is flat. Bowel sounds are normal.     Palpations: Abdomen is soft.  Musculoskeletal:        General: Normal range of motion.  Neurological:     General: No focal deficit present.     Mental Status: He is alert and oriented to person,  place, and time. Mental status is at baseline.  Psychiatric:        Mood and Affect: Mood normal.        Behavior: Behavior normal.        Thought Content: Thought content normal.        Judgment: Judgment normal.      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  Plan posterior laminotomy and foraminotomy using Metrix technique C3-4.  Sela Daft, MD 02/14/2024, 7:20 AM

## 2024-02-14 NOTE — Anesthesia Procedure Notes (Addendum)
 Procedure Name: Intubation Date/Time: 02/14/2024 7:47 AM  Performed by: Luwanna Sam, CRNAPre-anesthesia Checklist: Patient identified, Patient being monitored, Timeout performed, Emergency Drugs available and Suction available Patient Re-evaluated:Patient Re-evaluated prior to induction Oxygen Delivery Method: Circle system utilized Preoxygenation: Pre-oxygenation with 100% oxygen Induction Type: IV induction Ventilation: Mask ventilation without difficulty Laryngoscope Size: Glidescope and 4 Grade View: Grade I Tube type: Oral Tube size: 7.5 mm Number of attempts: 1 Airway Equipment and Method: Stylet Placement Confirmation: ETT inserted through vocal cords under direct vision, positive ETCO2 and breath sounds checked- equal and bilateral Secured at: 21 cm Tube secured with: Tape Dental Injury: Teeth and Oropharynx as per pre-operative assessment

## 2024-02-14 NOTE — Discharge Instructions (Signed)
 Wound Care Leave incision open to air. You may shower. Do not scrub directly on incision.  Do not put any creams, lotions, or ointments on incision. Activity Walk each and every day, increasing distance each day. No lifting greater than 8 lbs.  Avoid excessive neck motion. No driving for 2 weeks; may ride as a passenger locally.  Diet Resume your normal diet.   Call Your Doctor If Any of These Occur Redness, drainage, or swelling at the wound.  Temperature greater than 101 degrees. Severe pain not relieved by pain medication. Increased difficulty swallowing. Incision starts to come apart. Follow Up Appt Call today for appointment in 3 weeks ([phone]) or for problems.  If you have any hardware placed in your spine, you will need an x-ray before your appointment.

## 2024-02-14 NOTE — Discharge Summary (Signed)
 Physician Discharge Summary  Patient ID: JAIEL SARACENO, MD MRN: [id number] DOB/AGE: 04/24/1946 78 y.o.  Admit date: 02/14/2024 Discharge date: 02/14/2024  Admission Diagnoses: Left cervical radiculopathy C3-4   CPPD  Discharge Diagnoses: Cervical radiculopathy C3-4.  CPPD Principal Problem:   Cervical disc disorder with radiculopathy of cervical region   Discharged Condition: good  Hospital Course: Tolerated surgery well  Consults: None  Significant Diagnostic Studies: None  Treatments: surgery: See op note  Discharge Exam: Blood pressure (!) 144/83, pulse 65, temperature 97.7 F (36.5 C), resp. rate 20, height 5\' 9"  (1.753 m), weight 78 kg, SpO2 99%. Incision is clean and dry motor function is intact  Disposition: Discharge disposition: 01-Home or Self Care       Discharge Instructions     Call MD for:  redness, tenderness, or signs of infection (pain, swelling, redness, odor or green/yellow discharge around incision site)   Complete by: As directed    Call MD for:  severe uncontrolled pain   Complete by: As directed    Call MD for:  temperature >100.4   Complete by: As directed    Diet - low sodium heart healthy   Complete by: As directed    Discharge instructions   Complete by: As directed    Okay to shower. Do not apply salves or appointments to incision. No heavy lifting with the upper extremities greater than 10 pounds. May resume driving when not requiring pain medication and patient feels comfortable with doing so.   Incentive spirometry RT   Complete by: As directed    Increase activity slowly   Complete by: As directed       Allergies as of 02/14/2024       Reactions   Gentamycin [gentamicin] Hives, Itching   Turmeric Other (See Comments)   Cardiac arrythmias   Bacitracin  Rash   Hydrocodone  Rash   Moxifloxacin Rash   pt had severe red rash- he thought from Avelox Rx....   Neosporin [neomycin-bacitracin  Zn-polymyx] Rash   Other Other (See  Comments)   Redness  bandaide   Polymyxin B Other (See Comments), Rash   Promethazine  Other (See Comments)   Restless legs   Tape Dermatitis   bandaid-skin reaction   Triamcinolone Acetonide Itching, Rash           Medication List     TAKE these medications    albuterol  108 (90 Base) MCG/ACT inhaler Commonly known as: VENTOLIN  HFA Inhale 2 puffs into the lungs every 6 (six) hours as needed for wheezing or shortness of breath.   alfuzosin  10 MG 24 hr tablet Commonly known as: UROXATRAL  TAKE 1 TABLET BY MOUTH EVERY DAY IN THE EVENING   ALPRAZolam  0.5 MG tablet Commonly known as: XANAX  Take 1 tablet (0.5 mg total) by mouth 2 (two) times daily as needed for anxiety.   amLODipine  5 MG tablet Commonly known as: NORVASC  Take 0.5 tablets (2.5 mg total) by mouth daily.   aspirin  EC 81 MG tablet Take 81 mg by mouth at bedtime. Swallow whole.   atenolol  25 MG tablet Commonly known as: TENORMIN  Take 1 tablet (25 mg total) by mouth 2 (two) times daily.   celecoxib  50 MG capsule Commonly known as: CeleBREX  Take 1 capsule (50 mg total) by mouth 2 (two) times daily.   desoximetasone 0.25 % cream Commonly known as: TOPICORT Apply 1 Application topically every evening.   diazepam  5 MG tablet Commonly known as: Valium  Take 1 tablet (5 mg total) by mouth every  6 (six) hours as needed for muscle spasms.   HYDROmorphone  2 MG tablet Commonly known as: Dilaudid  Take 1 tablet (2 mg total) by mouth every 6 (six) hours as needed for up to 5 days for severe pain (pain score 7-10).   indapamide  1.25 MG tablet Commonly known as: LOZOL  TAKE 1 TABLET BY MOUTH DAILY.   multivitamin with minerals Tabs tablet Take 1 tablet by mouth daily.   olmesartan  20 MG tablet Commonly known as: BENICAR  TAKE 1 TABLET BY MOUTH EVERY DAY   pantoprazole  40 MG tablet Commonly known as: PROTONIX  TAKE 1 TABLET BY MOUTH EVERY DAY 30 MINUTES BEFORE EVENING MEALS   rosuvastatin  10 MG tablet Commonly  known as: CRESTOR  TAKE 1 TABLET BY MOUTH EVERYDAY AT BEDTIME   traMADol  50 MG tablet Commonly known as: ULTRAM  Take 1 tablet (50 mg total) by mouth every 12 (twelve) hours as needed.        Follow-up Information     Elna Haggis, MD. Call.   Specialty: Neurosurgery Why: As needed, If symptoms worsen Contact information: [address] [place] [address] [phone]                 Signed: Sela Daft 02/14/2024, 3:32 PM

## 2024-02-14 NOTE — Plan of Care (Signed)

## 2024-02-14 NOTE — Progress Notes (Signed)
 PT Cancellation Note  Patient Details Name: Peter SLOOP, MD MRN: [id number] DOB: [birth year]   Cancelled Treatment:    Reason Eval/Treat Not Completed: PT screened, no needs identified, will sign off - per RN, no PT needs, pt is independent and has already been seen by OT. PT to sign off.   Nicko Daher S, PT DPT Acute Rehabilitation Services Secure Chat Preferred  Office [phone]    Ethridge Herder 02/14/2024, 3:50 PM

## 2024-02-15 ENCOUNTER — Encounter (HOSPITAL_COMMUNITY): Payer: Self-pay | Admitting: Neurological Surgery

## 2024-02-16 NOTE — Anesthesia Postprocedure Evaluation (Signed)
 Anesthesia Post Note  Patient: Peter Hammock, MD  Procedure(s) Performed: POSTERIOR CERVICAL FORAMINOTOMY WITH MET- RX CERVICAL THREE-CERVICAL FOUR LEFT (Left: Back)     Patient location during evaluation: PACU Anesthesia Type: General Level of consciousness: awake and alert Pain management: pain level controlled Vital Signs Assessment: post-procedure vital signs reviewed and stable Respiratory status: spontaneous breathing, nonlabored ventilation and respiratory function stable Cardiovascular status: blood pressure returned to baseline and stable Postop Assessment: no apparent nausea or vomiting Anesthetic complications: no   No notable events documented.  Last Vitals:  Vitals:   02/14/24 1045 02/14/24 1100  BP: 127/74 (!) 144/83  Pulse: 68 65  Resp: 11 20  Temp: 36.5 C 36.5 C  SpO2: 95% 99%    Last Pain:  Vitals:   02/14/24 1045  TempSrc:   PainSc: 0-No pain                 Aasiya Creasey

## 2024-02-24 DIAGNOSIS — Z0110 Encounter for examination of ears and hearing without abnormal findings: Secondary | ICD-10-CM | POA: Diagnosis not present

## 2024-02-24 DIAGNOSIS — K22719 Barrett's esophagus with dysplasia, unspecified: Secondary | ICD-10-CM | POA: Diagnosis not present

## 2024-02-24 DIAGNOSIS — H938X3 Other specified disorders of ear, bilateral: Secondary | ICD-10-CM | POA: Diagnosis not present

## 2024-02-24 DIAGNOSIS — H903 Sensorineural hearing loss, bilateral: Secondary | ICD-10-CM | POA: Diagnosis not present

## 2024-02-24 DIAGNOSIS — H6123 Impacted cerumen, bilateral: Secondary | ICD-10-CM | POA: Diagnosis not present

## 2024-02-24 DIAGNOSIS — M1120 Other chondrocalcinosis, unspecified site: Secondary | ICD-10-CM | POA: Diagnosis not present

## 2024-02-24 DIAGNOSIS — R2689 Other abnormalities of gait and mobility: Secondary | ICD-10-CM | POA: Diagnosis not present

## 2024-02-24 DIAGNOSIS — Z23 Encounter for immunization: Secondary | ICD-10-CM | POA: Diagnosis not present

## 2024-02-24 DIAGNOSIS — H9313 Tinnitus, bilateral: Secondary | ICD-10-CM | POA: Diagnosis not present

## 2024-02-29 ENCOUNTER — Other Ambulatory Visit: Payer: Self-pay | Admitting: Internal Medicine

## 2024-02-29 DIAGNOSIS — M1712 Unilateral primary osteoarthritis, left knee: Secondary | ICD-10-CM

## 2024-02-29 DIAGNOSIS — M1711 Unilateral primary osteoarthritis, right knee: Secondary | ICD-10-CM

## 2024-02-29 DIAGNOSIS — M4716 Other spondylosis with myelopathy, lumbar region: Secondary | ICD-10-CM

## 2024-02-29 DIAGNOSIS — M479 Spondylosis, unspecified: Secondary | ICD-10-CM

## 2024-02-29 DIAGNOSIS — M65332 Trigger finger, left middle finger: Secondary | ICD-10-CM | POA: Diagnosis not present

## 2024-02-29 DIAGNOSIS — M1189 Other specified crystal arthropathies, multiple sites: Secondary | ICD-10-CM

## 2024-02-29 DIAGNOSIS — M150 Primary generalized (osteo)arthritis: Secondary | ICD-10-CM

## 2024-02-29 DIAGNOSIS — M1120 Other chondrocalcinosis, unspecified site: Secondary | ICD-10-CM | POA: Diagnosis not present

## 2024-02-29 DIAGNOSIS — M25522 Pain in left elbow: Secondary | ICD-10-CM | POA: Diagnosis not present

## 2024-02-29 DIAGNOSIS — M170 Bilateral primary osteoarthritis of knee: Secondary | ICD-10-CM

## 2024-03-02 ENCOUNTER — Ambulatory Visit (HOSPITAL_COMMUNITY): Attending: Cardiology

## 2024-03-02 DIAGNOSIS — I361 Nonrheumatic tricuspid (valve) insufficiency: Secondary | ICD-10-CM | POA: Diagnosis not present

## 2024-03-02 DIAGNOSIS — I471 Supraventricular tachycardia, unspecified: Secondary | ICD-10-CM | POA: Diagnosis not present

## 2024-03-02 LAB — ECHOCARDIOGRAM COMPLETE
Area-P 1/2: 3.24 cm2
S' Lateral: 3.4 cm

## 2024-03-03 ENCOUNTER — Other Ambulatory Visit: Payer: Self-pay | Admitting: Internal Medicine

## 2024-03-03 DIAGNOSIS — M1712 Unilateral primary osteoarthritis, left knee: Secondary | ICD-10-CM

## 2024-03-03 DIAGNOSIS — I10 Essential (primary) hypertension: Secondary | ICD-10-CM

## 2024-03-03 DIAGNOSIS — M4716 Other spondylosis with myelopathy, lumbar region: Secondary | ICD-10-CM

## 2024-03-03 DIAGNOSIS — M1711 Unilateral primary osteoarthritis, right knee: Secondary | ICD-10-CM

## 2024-03-03 DIAGNOSIS — M150 Primary generalized (osteo)arthritis: Secondary | ICD-10-CM

## 2024-03-03 DIAGNOSIS — I471 Supraventricular tachycardia, unspecified: Secondary | ICD-10-CM

## 2024-03-03 DIAGNOSIS — M479 Spondylosis, unspecified: Secondary | ICD-10-CM

## 2024-03-03 DIAGNOSIS — G629 Polyneuropathy, unspecified: Secondary | ICD-10-CM

## 2024-03-03 DIAGNOSIS — M48062 Spinal stenosis, lumbar region with neurogenic claudication: Secondary | ICD-10-CM

## 2024-03-03 DIAGNOSIS — M1189 Other specified crystal arthropathies, multiple sites: Secondary | ICD-10-CM

## 2024-03-03 DIAGNOSIS — M5020 Other cervical disc displacement, unspecified cervical region: Secondary | ICD-10-CM

## 2024-03-03 DIAGNOSIS — M170 Bilateral primary osteoarthritis of knee: Secondary | ICD-10-CM

## 2024-03-03 DIAGNOSIS — M5136 Other intervertebral disc degeneration, lumbar region with discogenic back pain only: Secondary | ICD-10-CM

## 2024-03-06 ENCOUNTER — Encounter: Payer: Self-pay | Admitting: Physician Assistant

## 2024-03-06 ENCOUNTER — Other Ambulatory Visit: Payer: Self-pay | Admitting: *Deleted

## 2024-03-06 ENCOUNTER — Other Ambulatory Visit: Payer: Self-pay | Admitting: Internal Medicine

## 2024-03-06 DIAGNOSIS — I77810 Thoracic aortic ectasia: Secondary | ICD-10-CM | POA: Insufficient documentation

## 2024-03-06 DIAGNOSIS — I10 Essential (primary) hypertension: Secondary | ICD-10-CM

## 2024-03-06 DIAGNOSIS — I361 Nonrheumatic tricuspid (valve) insufficiency: Secondary | ICD-10-CM

## 2024-03-06 HISTORY — DX: Thoracic aortic ectasia: I77.810

## 2024-03-06 MED ORDER — TRAMADOL HCL 50 MG PO TABS: 50.0000 mg | ORAL_TABLET | Freq: Two times a day (BID) | ORAL | 0 refills | Status: DC | PRN

## 2024-03-13 DIAGNOSIS — H04123 Dry eye syndrome of bilateral lacrimal glands: Secondary | ICD-10-CM | POA: Diagnosis not present

## 2024-03-13 DIAGNOSIS — H25013 Cortical age-related cataract, bilateral: Secondary | ICD-10-CM | POA: Diagnosis not present

## 2024-03-13 DIAGNOSIS — H52203 Unspecified astigmatism, bilateral: Secondary | ICD-10-CM | POA: Diagnosis not present

## 2024-03-13 DIAGNOSIS — H2513 Age-related nuclear cataract, bilateral: Secondary | ICD-10-CM | POA: Diagnosis not present

## 2024-03-13 DIAGNOSIS — H18513 Endothelial corneal dystrophy, bilateral: Secondary | ICD-10-CM | POA: Diagnosis not present

## 2024-03-14 DIAGNOSIS — I517 Cardiomegaly: Secondary | ICD-10-CM

## 2024-03-14 DIAGNOSIS — G5603 Carpal tunnel syndrome, bilateral upper limbs: Secondary | ICD-10-CM

## 2024-03-14 DIAGNOSIS — S46219D Strain of muscle, fascia and tendon of other parts of biceps, unspecified arm, subsequent encounter: Secondary | ICD-10-CM

## 2024-03-15 ENCOUNTER — Encounter: Payer: Self-pay | Admitting: Internal Medicine

## 2024-03-17 NOTE — Telephone Encounter (Signed)
 Please contact patient to give him instructions on how to get the SPEP/UPEP and PYP Scan done. Marlyse Single, PA-C    03/17/2024 12:17 PM

## 2024-03-23 NOTE — Telephone Encounter (Signed)
 Can you let him know what to do for the SPEP/UPEP? I think he probably just goes to [facility], but not sure.  Can you also check on timing of scheduling the PYP scan and let him know?  Thanks, Marlyse Single, PA-C    03/23/2024 8:33 AM

## 2024-03-28 DIAGNOSIS — S46219D Strain of muscle, fascia and tendon of other parts of biceps, unspecified arm, subsequent encounter: Secondary | ICD-10-CM | POA: Diagnosis not present

## 2024-03-28 DIAGNOSIS — G5603 Carpal tunnel syndrome, bilateral upper limbs: Secondary | ICD-10-CM | POA: Diagnosis not present

## 2024-03-28 DIAGNOSIS — M542 Cervicalgia: Secondary | ICD-10-CM | POA: Diagnosis not present

## 2024-03-28 DIAGNOSIS — I517 Cardiomegaly: Secondary | ICD-10-CM | POA: Diagnosis not present

## 2024-03-28 DIAGNOSIS — M6281 Muscle weakness (generalized): Secondary | ICD-10-CM | POA: Diagnosis not present

## 2024-03-28 DIAGNOSIS — M47812 Spondylosis without myelopathy or radiculopathy, cervical region: Secondary | ICD-10-CM | POA: Diagnosis not present

## 2024-03-28 DIAGNOSIS — R269 Unspecified abnormalities of gait and mobility: Secondary | ICD-10-CM | POA: Diagnosis not present

## 2024-03-29 ENCOUNTER — Ambulatory Visit: Payer: Self-pay | Admitting: *Deleted

## 2024-03-29 DIAGNOSIS — E854 Organ-limited amyloidosis: Secondary | ICD-10-CM

## 2024-04-01 LAB — UPEP/UIFE/LIGHT CHAINS/TP, 24-HR UR: Kappa/Lambda Ratio,U: 3.94 (ref 1.83–14.26)

## 2024-04-04 LAB — MULTIPLE MYELOMA PANEL, SERUM
Albumin SerPl Elph-Mcnc: 4 g/dL (ref 2.9–4.4)
Albumin/Glob SerPl: 1.6 (ref 0.7–1.7)
Alpha 1: 0.2 g/dL (ref 0.0–0.4)
Alpha2 Glob SerPl Elph-Mcnc: 0.7 g/dL (ref 0.4–1.0)
B-Globulin SerPl Elph-Mcnc: 0.8 g/dL (ref 0.7–1.3)
Gamma Glob SerPl Elph-Mcnc: 0.8 g/dL (ref 0.4–1.8)
Globulin, Total: 2.6 g/dL (ref 2.2–3.9)
IgA/Immunoglobulin A, Serum: 164 mg/dL (ref 61–437)
IgG (Immunoglobin G), Serum: 959 mg/dL (ref 603–1613)
IgM (Immunoglobulin M), Srm: 83 mg/dL (ref 15–143)
Total Protein: 6.6 g/dL (ref 6.0–8.5)

## 2024-04-04 LAB — UPEP/UIFE/LIGHT CHAINS/TP, 24-HR UR
% BETA, Urine: 0 %
ALBUMIN, U: 100 %
ALPHA 1 URINE: 0 %
ALPHA-2-GLOBULIN, U: 0 %
Free Lambda Lt Chains,Ur: 6.66 mg/L (ref 1.17–86.46)
GAMMA GLOBULIN URINE: 0 %
Kappa/Lambda Ratio,U: 3.94 mg/L (ref 1.83–15.21)
NOTE:: 6.66 mg/L (ref 1.17–86.46)
Protein, 24H Urine: 103 mg/(24.h) (ref 30–150)
Protein, Ur: 5.7 mg/dL

## 2024-04-12 DIAGNOSIS — M542 Cervicalgia: Secondary | ICD-10-CM | POA: Diagnosis not present

## 2024-04-12 DIAGNOSIS — M47812 Spondylosis without myelopathy or radiculopathy, cervical region: Secondary | ICD-10-CM | POA: Diagnosis not present

## 2024-04-12 DIAGNOSIS — M6281 Muscle weakness (generalized): Secondary | ICD-10-CM | POA: Diagnosis not present

## 2024-04-12 DIAGNOSIS — R269 Unspecified abnormalities of gait and mobility: Secondary | ICD-10-CM | POA: Diagnosis not present

## 2024-04-19 DIAGNOSIS — M6281 Muscle weakness (generalized): Secondary | ICD-10-CM | POA: Diagnosis not present

## 2024-04-19 DIAGNOSIS — R269 Unspecified abnormalities of gait and mobility: Secondary | ICD-10-CM | POA: Diagnosis not present

## 2024-04-19 DIAGNOSIS — M47812 Spondylosis without myelopathy or radiculopathy, cervical region: Secondary | ICD-10-CM | POA: Diagnosis not present

## 2024-04-19 DIAGNOSIS — M542 Cervicalgia: Secondary | ICD-10-CM | POA: Diagnosis not present

## 2024-04-26 ENCOUNTER — Other Ambulatory Visit: Payer: Self-pay | Admitting: Internal Medicine

## 2024-04-26 DIAGNOSIS — N401 Enlarged prostate with lower urinary tract symptoms: Secondary | ICD-10-CM

## 2024-05-02 DIAGNOSIS — R269 Unspecified abnormalities of gait and mobility: Secondary | ICD-10-CM | POA: Diagnosis not present

## 2024-05-02 DIAGNOSIS — M6281 Muscle weakness (generalized): Secondary | ICD-10-CM | POA: Diagnosis not present

## 2024-05-02 DIAGNOSIS — M47812 Spondylosis without myelopathy or radiculopathy, cervical region: Secondary | ICD-10-CM | POA: Diagnosis not present

## 2024-05-02 DIAGNOSIS — M542 Cervicalgia: Secondary | ICD-10-CM | POA: Diagnosis not present

## 2024-05-10 ENCOUNTER — Other Ambulatory Visit: Payer: Self-pay | Admitting: Internal Medicine

## 2024-05-10 DIAGNOSIS — M542 Cervicalgia: Secondary | ICD-10-CM | POA: Diagnosis not present

## 2024-05-10 DIAGNOSIS — M6281 Muscle weakness (generalized): Secondary | ICD-10-CM | POA: Diagnosis not present

## 2024-05-10 DIAGNOSIS — M47812 Spondylosis without myelopathy or radiculopathy, cervical region: Secondary | ICD-10-CM | POA: Diagnosis not present

## 2024-05-10 DIAGNOSIS — E785 Hyperlipidemia, unspecified: Secondary | ICD-10-CM

## 2024-05-10 DIAGNOSIS — R269 Unspecified abnormalities of gait and mobility: Secondary | ICD-10-CM | POA: Diagnosis not present

## 2024-05-11 ENCOUNTER — Encounter: Payer: Self-pay | Admitting: Internal Medicine

## 2024-05-16 DIAGNOSIS — M47812 Spondylosis without myelopathy or radiculopathy, cervical region: Secondary | ICD-10-CM | POA: Diagnosis not present

## 2024-05-16 DIAGNOSIS — R269 Unspecified abnormalities of gait and mobility: Secondary | ICD-10-CM | POA: Diagnosis not present

## 2024-05-16 DIAGNOSIS — M6281 Muscle weakness (generalized): Secondary | ICD-10-CM | POA: Diagnosis not present

## 2024-05-16 DIAGNOSIS — M542 Cervicalgia: Secondary | ICD-10-CM | POA: Diagnosis not present

## 2024-05-18 ENCOUNTER — Telehealth (HOSPITAL_COMMUNITY): Payer: Self-pay | Admitting: *Deleted

## 2024-05-18 NOTE — Telephone Encounter (Signed)
 Left instructions for amyloid study on pt's vm.

## 2024-05-23 DIAGNOSIS — M47812 Spondylosis without myelopathy or radiculopathy, cervical region: Secondary | ICD-10-CM | POA: Diagnosis not present

## 2024-05-23 DIAGNOSIS — M6281 Muscle weakness (generalized): Secondary | ICD-10-CM | POA: Diagnosis not present

## 2024-05-23 DIAGNOSIS — M542 Cervicalgia: Secondary | ICD-10-CM | POA: Diagnosis not present

## 2024-05-23 DIAGNOSIS — R269 Unspecified abnormalities of gait and mobility: Secondary | ICD-10-CM | POA: Diagnosis not present

## 2024-05-24 ENCOUNTER — Other Ambulatory Visit: Payer: Self-pay | Admitting: Physician Assistant

## 2024-05-24 DIAGNOSIS — G5603 Carpal tunnel syndrome, bilateral upper limbs: Secondary | ICD-10-CM

## 2024-05-24 DIAGNOSIS — I517 Cardiomegaly: Secondary | ICD-10-CM

## 2024-05-24 DIAGNOSIS — S46219D Strain of muscle, fascia and tendon of other parts of biceps, unspecified arm, subsequent encounter: Secondary | ICD-10-CM

## 2024-05-25 ENCOUNTER — Ambulatory Visit (HOSPITAL_COMMUNITY)
Admission: RE | Admit: 2024-05-25 | Discharge: 2024-05-25 | Disposition: A | Discharge status: Home / Self Care | Source: Ambulatory Visit | Attending: Cardiology | Admitting: Cardiology

## 2024-05-25 DIAGNOSIS — I517 Cardiomegaly: Secondary | ICD-10-CM | POA: Diagnosis not present

## 2024-05-25 DIAGNOSIS — S46219D Strain of muscle, fascia and tendon of other parts of biceps, unspecified arm, subsequent encounter: Secondary | ICD-10-CM

## 2024-05-25 DIAGNOSIS — G5603 Carpal tunnel syndrome, bilateral upper limbs: Secondary | ICD-10-CM

## 2024-05-25 MED ORDER — TECHNETIUM TC 99M PYROPHOSPHATE
21.5000 | Freq: Once | INTRAVENOUS | Status: AC
  Administered 2024-05-25: 21.5 via INTRAVENOUS

## 2024-05-26 ENCOUNTER — Encounter: Payer: Self-pay | Admitting: Cardiovascular Disease

## 2024-05-26 ENCOUNTER — Encounter: Payer: Self-pay | Admitting: Internal Medicine

## 2024-05-26 DIAGNOSIS — I43 Cardiomyopathy in diseases classified elsewhere: Secondary | ICD-10-CM

## 2024-05-26 DIAGNOSIS — R9439 Abnormal result of other cardiovascular function study: Secondary | ICD-10-CM

## 2024-05-26 LAB — MYOCARDIAL AMYLOID PLANAR & SPECT: H/CL Ratio: 1.46

## 2024-05-29 ENCOUNTER — Telehealth (HOSPITAL_COMMUNITY): Payer: Self-pay | Admitting: Vascular Surgery

## 2024-05-29 ENCOUNTER — Encounter: Payer: Self-pay | Admitting: Internal Medicine

## 2024-05-29 ENCOUNTER — Encounter: Payer: Self-pay | Admitting: *Deleted

## 2024-05-29 ENCOUNTER — Other Ambulatory Visit: Payer: Self-pay

## 2024-05-29 ENCOUNTER — Ambulatory Visit: Payer: Self-pay | Admitting: Physician Assistant

## 2024-05-29 DIAGNOSIS — M5020 Other cervical disc displacement, unspecified cervical region: Secondary | ICD-10-CM

## 2024-05-29 DIAGNOSIS — M4716 Other spondylosis with myelopathy, lumbar region: Secondary | ICD-10-CM

## 2024-05-29 DIAGNOSIS — M5136 Other intervertebral disc degeneration, lumbar region with discogenic back pain only: Secondary | ICD-10-CM

## 2024-05-29 DIAGNOSIS — M1711 Unilateral primary osteoarthritis, right knee: Secondary | ICD-10-CM

## 2024-05-29 DIAGNOSIS — M48062 Spinal stenosis, lumbar region with neurogenic claudication: Secondary | ICD-10-CM

## 2024-05-29 DIAGNOSIS — M170 Bilateral primary osteoarthritis of knee: Secondary | ICD-10-CM

## 2024-05-29 DIAGNOSIS — E854 Organ-limited amyloidosis: Secondary | ICD-10-CM | POA: Insufficient documentation

## 2024-05-29 DIAGNOSIS — M150 Primary generalized (osteo)arthritis: Secondary | ICD-10-CM

## 2024-05-29 DIAGNOSIS — M1712 Unilateral primary osteoarthritis, left knee: Secondary | ICD-10-CM

## 2024-05-29 DIAGNOSIS — G629 Polyneuropathy, unspecified: Secondary | ICD-10-CM

## 2024-05-29 DIAGNOSIS — M1189 Other specified crystal arthropathies, multiple sites: Secondary | ICD-10-CM

## 2024-05-29 MED ORDER — TRAMADOL HCL 50 MG PO TABS: 50.0000 mg | ORAL_TABLET | Freq: Two times a day (BID) | ORAL | 0 refills | Status: DC | PRN

## 2024-05-29 NOTE — Telephone Encounter (Signed)
 LVM to make new pt ap[pt w/ [facility] 8/8 @ 11 or 12

## 2024-05-29 NOTE — Telephone Encounter (Signed)
 Pt called office as pharmacy did not get a call that it was ok to fill the tramadol  early so they are not giving it to him.   Called pharmacy and gave them the verbal ok for early refill.   LM for pt that I spoke w pharmacy and if he continues to have any issues getting rx please let us  know

## 2024-05-30 ENCOUNTER — Telehealth (HOSPITAL_COMMUNITY): Payer: Self-pay | Admitting: Cardiology

## 2024-05-30 NOTE — Telephone Encounter (Signed)
 Placed order for Cardiac MRI.

## 2024-05-30 NOTE — Telephone Encounter (Signed)
 Called to confirm/remind patient of their appointment at [facility] on 05/30/[facility].   Appointment:   [x] Confirmed  [] Left mess   [] No answer/No voice mail  [] VM Full/unable to leave message  [] Phone not in service  Patient reminded to bring all medications and/or complete list.  Confirmed patient has transportation. Gave directions, instructed to utilize valet parking.

## 2024-05-31 ENCOUNTER — Other Ambulatory Visit (HOSPITAL_COMMUNITY): Payer: Self-pay

## 2024-05-31 ENCOUNTER — Telehealth (HOSPITAL_COMMUNITY): Payer: Self-pay | Admitting: Pharmacist

## 2024-05-31 ENCOUNTER — Ambulatory Visit (HOSPITAL_COMMUNITY): Payer: Self-pay | Admitting: Cardiology

## 2024-05-31 ENCOUNTER — Ambulatory Visit (HOSPITAL_COMMUNITY)
Admission: RE | Admit: 2024-05-31 | Discharge: 2024-05-31 | Disposition: A | Discharge status: Home / Self Care | Source: Ambulatory Visit | Attending: Cardiology | Admitting: Cardiology

## 2024-05-31 ENCOUNTER — Encounter (HOSPITAL_COMMUNITY): Payer: Self-pay | Admitting: Cardiology

## 2024-05-31 VITALS — BP 110/70 | HR 65 | Wt 175.4 lb

## 2024-05-31 DIAGNOSIS — G629 Polyneuropathy, unspecified: Secondary | ICD-10-CM | POA: Insufficient documentation

## 2024-05-31 DIAGNOSIS — I77810 Thoracic aortic ectasia: Secondary | ICD-10-CM

## 2024-05-31 DIAGNOSIS — I471 Supraventricular tachycardia, unspecified: Secondary | ICD-10-CM

## 2024-05-31 DIAGNOSIS — Z79899 Other long term (current) drug therapy: Secondary | ICD-10-CM | POA: Insufficient documentation

## 2024-05-31 DIAGNOSIS — I10 Essential (primary) hypertension: Secondary | ICD-10-CM | POA: Insufficient documentation

## 2024-05-31 DIAGNOSIS — Z7982 Long term (current) use of aspirin: Secondary | ICD-10-CM | POA: Diagnosis not present

## 2024-05-31 DIAGNOSIS — M549 Dorsalgia, unspecified: Secondary | ICD-10-CM | POA: Diagnosis not present

## 2024-05-31 DIAGNOSIS — G5603 Carpal tunnel syndrome, bilateral upper limbs: Secondary | ICD-10-CM | POA: Insufficient documentation

## 2024-05-31 DIAGNOSIS — E854 Organ-limited amyloidosis: Secondary | ICD-10-CM | POA: Diagnosis not present

## 2024-05-31 DIAGNOSIS — I43 Cardiomyopathy in diseases classified elsewhere: Secondary | ICD-10-CM | POA: Insufficient documentation

## 2024-05-31 DIAGNOSIS — I25110 Atherosclerotic heart disease of native coronary artery with unstable angina pectoris: Secondary | ICD-10-CM | POA: Diagnosis not present

## 2024-05-31 DIAGNOSIS — I2510 Atherosclerotic heart disease of native coronary artery without angina pectoris: Secondary | ICD-10-CM | POA: Diagnosis not present

## 2024-05-31 LAB — LIPID PANEL
Cholesterol: 138 mg/dL (ref 0–200)
HDL: 56 mg/dL (ref >40)
LDL Cholesterol: 64 mg/dL (ref 0–99)
Total CHOL/HDL Ratio: 2.5 ratio
Triglycerides: 92 mg/dL (ref <150)
VLDL: 18 mg/dL (ref 0–40)

## 2024-05-31 LAB — BRAIN NATRIURETIC PEPTIDE: B Natriuretic Peptide: 49.5 pg/mL (ref 0.0–100.0)

## 2024-05-31 MED ORDER — ATTRUBY 356 MG PO TBPK: 712.0000 mg | ORAL_TABLET | Freq: Two times a day (BID) | ORAL | 11 refills | Status: DC

## 2024-05-31 NOTE — Patient Instructions (Addendum)
 START Attruby  712 mg Twice daily  Labs done today, your results will be available in MyChart, we will contact you for abnormal readings.  Genetic testing has been collected, this has to be sent to [place]  for processing and can take 1-2 weeks for us  to get results back.  We will let you know the results once reviewed by your provider.  Your physician recommends that you schedule a follow-up appointment in: 2 months ( October) ** PLEASE CALL THE OFFICE IN 3 WEEKS TO ARRANGE YOUR FOLLOW UP APPOINTMENT.**  If you have any questions or concerns before your next appointment please send us  a message through [place] or call our office at [phone].    TO LEAVE A MESSAGE FOR THE NURSE SELECT OPTION 2, PLEASE LEAVE A MESSAGE INCLUDING: YOUR NAME DATE OF BIRTH CALL BACK NUMBER REASON FOR CALL**this is important as we prioritize the call backs  YOU WILL RECEIVE A CALL BACK THE SAME DAY AS LONG AS YOU CALL BEFORE 4:00 PM  At the [facility], you and your health needs are our priority. As part of our continuing mission to provide you with exceptional heart care, we have created designated Provider Care Teams. These Care Teams include your primary Cardiologist (physician) and Advanced Practice Providers (APPs- Physician Assistants and Nurse Practitioners) who all work together to provide you with the care you need, when you need it.   You may see any of the following providers on your designated Care Team at your next follow up: Dr Toribio Fuel Dr Ezra Shuck Dr. Ria Commander Dr. Morene Brownie Amy Lenetta, NP Caffie Shed, [place] [facility] [place], [place] Beckey Coe, NP [place] Lee, NP Ellouise Class, NP Tinnie Redman, PharmD Jaun Bash, PharmD   Please be sure to bring in all your medications bottles to every appointment.    Thank you for choosing [facility]

## 2024-05-31 NOTE — Progress Notes (Signed)
 TTR genetic testing collected via blood per Dr Rolan.  Order form completed, signed and shipped with sample by Fed[employer] to [facility].

## 2024-05-31 NOTE — Telephone Encounter (Signed)
 Patient Advocate Encounter   Received notification from [facility] that prior authorization for Attruby  is required. Of note, insurance prefers Vyndaqel , may have to try and fail before will be approved.    PA submitted on CoverMyMeds Key [id number]  Status is pending   Will continue to follow.   Tinnie Redman, PharmD, BCPS, BCCP, CPP [facility] Pharmacist [phone]

## 2024-06-01 ENCOUNTER — Other Ambulatory Visit: Payer: Self-pay

## 2024-06-01 ENCOUNTER — Encounter (HOSPITAL_COMMUNITY): Payer: Self-pay | Admitting: Cardiology

## 2024-06-01 ENCOUNTER — Encounter (HOSPITAL_COMMUNITY): Payer: Self-pay

## 2024-06-01 ENCOUNTER — Other Ambulatory Visit (HOSPITAL_COMMUNITY): Payer: Self-pay | Admitting: Pharmacist

## 2024-06-01 ENCOUNTER — Other Ambulatory Visit (HOSPITAL_COMMUNITY): Payer: Self-pay | Admitting: Pharmacy Technician

## 2024-06-01 ENCOUNTER — Other Ambulatory Visit (HOSPITAL_COMMUNITY): Payer: Self-pay

## 2024-06-01 MED ORDER — VYNDAQEL 20 MG PO CAPS
80.0000 mg | ORAL_CAPSULE | Freq: Every day | ORAL | 11 refills | Status: DC
  Filled 2024-06-01: qty 120, 30d supply, fill #0
  Filled 2024-06-14: qty 120, 30d supply, fill #1

## 2024-06-01 NOTE — Progress Notes (Addendum)
 PCP: Joshua Debby CROME, MD Cardiology: Dr. Delford HF Cardiology: Dr. Rolan  Chief complaint: Cardiac amyloidosis  79 y.o. with history of HTN, calcium  pyrophosphate deposition disease arthritis, spinal stenosis, carpal tunnel syndrome, biceps tendon rupture, and peripheral neuropathy was referred by Dr. Delford for evaluation of cardiac amyloidosis. Patient had cath in 2022 with nonobstructive LAD disease, otherwise no prior significant cardiac history.  However, he has had a rotator cuff tear, bilateral carpal tunnel repairs in 2012, biceps tendon rupture in 2022, c-spine and L-spine stenosis with prior surgeries. His biceps tendon rupture had minimal provocation, was picking up a light suitcase.   He has peripheral neuropathy of uncertain etiology extensively worked up at [facility] in the past (no diabetes, B12 normal, etc).  He had an echo done in 5/25 which showed EF 65-70% with borderline LVH, normal RV, PASP 37 mmHg, normal strain pattern.  Though the echo was not particularly abnormal, given the constellation of signs and symptoms described above, he had a PYP scan done in 7/25 which was markedly positive, grade 3, suggestive of transthyretin cardiac amyloidosis. SPEP/UPEP negative.   Patient has good exercise tolerance.  He is short of breath after walking 2 flights of stairs.  He does Pilates and walks for exercise, no dyspnea on flat ground.  He was in [place] recently and walked 5-10 miles/day without cardiopulmonary limitation.  He does have problems with back pain and has done PT for this.  He has tingling/numbness in his feet attributed to peripheral neuropathy.  No palpitations or lightheadedness.  No chest pain.  No orthopnea/PND.   ECG (personally reviewed): NSR with PVC  Labs (1/25): Pro-BNP 27 Labs (4/25): K 4.6, creatinine 1.22 Labs (6/25): SPEP/UPEP negative  PMH: 1. HTN 2. SVT: ?Atrial tachycardia, noted on prior monitors.  3. Rotator cuff tear 4. Biceps tendon rupture 2022 5.  Bilateral carpal tunnel syndrome surgery 2012 6. Peripheral neuropathy: Of uncertain etiology.  Workup at [facility], had nerve conduction studies/EMG.  7. 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  8. Calcium  pyrophosphate deposition disease arthritis 9. H/o melanoma 10. CAD: LHC (2022) with 50% mid LCx stenosis.  11. Cardiac amyloidosis:  - Echo (5/25): EF 65-70% with borderline LVH, normal RV, PASP 37 mmHg, normal strain pattern.  - PYP scan (7/25): Grade 3, strongly suggestive of TTR cardiac amyloidosis.   FH: No cardiac disease.  Mother and sister with Alzheimer's-type dementia, brother with esophageal cancer.   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   ROS: All systems reviewed and negative except as per HPI.   Current Outpatient Medications  Medication Sig Dispense Refill   alfuzosin  (UROXATRAL ) 10 MG 24 hr tablet TAKE 1 TABLET BY MOUTH EVERY DAY IN THE EVENING. Patient to follow up with PCP prior to future refills 90 tablet 0   ALPRAZolam  (XANAX ) 0.5 MG tablet Take 1 tablet (0.5 mg total) by mouth 2 (two) times daily as needed for anxiety. 180 tablet 0   aspirin  EC 81 MG tablet Take 81 mg by mouth every 3 (three) days. Swallow whole.     celecoxib  (CELEBREX ) 50 MG capsule TAKE 1 CAPSULE BY MOUTH 2 TIMES DAILY. 180 capsule 0   desoximetasone (TOPICORT) 0.25 % cream Apply 1 Application topically every evening.     indapamide  (LOZOL ) 1.25 MG tablet TAKE 1 TABLET BY MOUTH DAILY. 90 tablet 0   Multiple Vitamin (MULTIVITAMIN WITH MINERALS) TABS tablet Take 1 tablet by mouth daily.     olmesartan  (BENICAR ) 20 MG tablet TAKE  1 TABLET BY MOUTH EVERY DAY 90 tablet 0   pantoprazole  (PROTONIX ) 40 MG tablet TAKE 1 TABLET BY MOUTH EVERY DAY 30 MINUTES BEFORE EVENING MEALS 90 tablet 1   rosuvastatin  (CRESTOR ) 10 MG tablet TAKE 1 TABLET BY MOUTH EVERYDAY AT BEDTIME 90 tablet 1   traMADol  (ULTRAM ) 50 MG tablet Take 50 mg by mouth 2 (two) times daily.     amLODipine   (NORVASC ) 5 MG tablet TAKE 1 TABLET (5 MG TOTAL) BY MOUTH DAILY. (Patient not taking: Reported on 05/31/2024) 90 tablet 0   atenolol  (TENORMIN ) 25 MG tablet Take 1 tablet (25 mg total) by mouth 2 (two) times daily. (Patient not taking: Reported on 05/31/2024) 90 tablet 3   Tafamidis  Meglumine , Cardiac, (VYNDAQEL ) 20 MG CAPS Take 4 capsules (80 mg total) by mouth daily. 120 capsule 11   No current facility-administered medications for this encounter.   BP 110/70   Pulse 65   Wt 79.6 kg (175 lb 6.4 oz)   SpO2 97%   BMI 25.90 kg/m  General: NAD Neck: No JVD, no thyromegaly or thyroid  nodule.  Lungs: Clear to auscultation bilaterally with normal respiratory effort. CV: Nondisplaced PMI.  Heart regular S1/S2, no S3/S4, no murmur.  No peripheral edema.  No carotid bruit.  Normal pedal pulses.  Abdomen: Soft, nontender, no hepatosplenomegaly, no distention.  Skin: Intact without lesions or rashes.  Neurologic: Alert and oriented x 3.  Psych: Normal affect. Extremities: No clubbing or cyanosis.  HEENT: Normal.   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  SPEP and UPEP negative.  Echo shows borderline LVH, but I did not find the echo strongly suggestive of cardiac amyloidosis.  However, the PYP scan was markedly positive, grade 3.  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  NYHA class I-II symptoms, not volume overloaded on exam.  - Cardiac MRI is ordered, this is reasonable for another evaluation of the extent of cardiac involvement by amyloidosis.  - I will order a myeloma panel to formally complete the rule out of AL amyloidosis (had  screening SPEP and UPEP).  - I am going to start acoramidis  (will involve our pharmacist to this started).   - Depending on response of neuropathy, he may benefit from addition of vutrisiran or eplontersen.  - Check BNP - I will send genetic testing for TTR mutations, ?hereditary vs wild type TTR.  I discussed implications of genetic testing with patient and wife.  2. Peripheral neuropathy: Extensive workup and [facility], no etiology determined.  Not a diabetic, B12 normal, myeloma workup has been negative. Had EMG and nerve conduction studies.   - Suspect due to amyloidosis, as above.   3. HTN: BP controlled, continue current regimen.  4. CAD: 50% mid LAD stenosis on cath in 2022.   - Continue ASA 81 - Continue statin.   Followup with me in weeks.  I spent 61 minutes reviewing records, interviewing/examining patient, and managing orders.   Ezra Shuck 06/01/2024

## 2024-06-01 NOTE — Telephone Encounter (Signed)
 Advanced [facility] Failure Patient Advocate Encounter  Prior Authorization for Attruby  has been denied.  Attruby  is nonformulary and patient needs to try and fail Vyndaqel  (approved key [id number]; approved 05/17/24-until further notice)  Discussed with patient and Dr. Rolan. Plan will be to start Vyndaqel . Will reassess in about 1 month and then file appeal for Attruby , as it is a more potent stabilizer.   Patient will start Vyndaqel  today. Cost is [phone] as this is a specialty medication and patient has not hit the $2000 out of pocket max for Medicare yet this year. After this one time payment, medication will be $0. Patient aware and would prefer to pay the one time cost all at once rather than enroll in [facility] Payment Plan this year.   Tinnie Redman, PharmD, BCPS, BCCP, CPP [facility] Failure Clinic Pharmacist [phone]

## 2024-06-01 NOTE — Telephone Encounter (Signed)
 Thank you for the update. Called and spoke with patient about treatment options.

## 2024-06-01 NOTE — Progress Notes (Signed)
 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   Patient is aware of $1877.50 copayment. Patient will likely need two fills of Vyndaqel  before being able to switch to Attruby .  Almarie JULIANNA Pa, CPhT

## 2024-06-01 NOTE — Addendum Note (Signed)
 Encounter addended by: Rolan Ezra RAMAN, MD on: 06/01/2024 9:29 PM  Actions taken: Clinical Note Signed

## 2024-06-01 NOTE — Telephone Encounter (Signed)
 Patient left VM on triage reporting he was notified that  Attruby  was denied. Wanted to pass information along,

## 2024-06-02 ENCOUNTER — Other Ambulatory Visit: Payer: Self-pay

## 2024-06-02 ENCOUNTER — Other Ambulatory Visit: Payer: Self-pay | Admitting: Internal Medicine

## 2024-06-02 ENCOUNTER — Other Ambulatory Visit (HOSPITAL_COMMUNITY): Payer: Self-pay

## 2024-06-02 DIAGNOSIS — M1711 Unilateral primary osteoarthritis, right knee: Secondary | ICD-10-CM

## 2024-06-02 DIAGNOSIS — M1712 Unilateral primary osteoarthritis, left knee: Secondary | ICD-10-CM

## 2024-06-02 DIAGNOSIS — M1189 Other specified crystal arthropathies, multiple sites: Secondary | ICD-10-CM

## 2024-06-02 DIAGNOSIS — I10 Essential (primary) hypertension: Secondary | ICD-10-CM

## 2024-06-02 DIAGNOSIS — M4716 Other spondylosis with myelopathy, lumbar region: Secondary | ICD-10-CM

## 2024-06-02 DIAGNOSIS — M150 Primary generalized (osteo)arthritis: Secondary | ICD-10-CM

## 2024-06-02 DIAGNOSIS — M170 Bilateral primary osteoarthritis of knee: Secondary | ICD-10-CM

## 2024-06-02 DIAGNOSIS — M479 Spondylosis, unspecified: Secondary | ICD-10-CM

## 2024-06-08 ENCOUNTER — Ambulatory Visit (INDEPENDENT_AMBULATORY_CARE_PROVIDER_SITE_OTHER): Payer: Medicare Other

## 2024-06-08 VITALS — BP 120/82 | HR 77 | Ht 69.0 in | Wt 176.2 lb

## 2024-06-08 DIAGNOSIS — Z23 Encounter for immunization: Secondary | ICD-10-CM

## 2024-06-08 DIAGNOSIS — Z0000 Encounter for general adult medical examination without abnormal findings: Secondary | ICD-10-CM | POA: Diagnosis not present

## 2024-06-08 NOTE — Progress Notes (Signed)
 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 who presents for a Medicare Wellness preventive visit.  As a reminder, Annual Wellness Visits don't include a physical exam, and some assessments may be limited, especially if this visit is performed virtually. We may recommend an in-person follow-up visit with your provider if needed.  Visit Complete: In person  VideoDeclined- This patient declined Interactive audio and Acupuncturist. Therefore the visit was completed with audio only.  Persons Participating in Visit: Patient.  AWV Questionnaire: Yes: Patient Medicare AWV questionnaire was completed by the patient on 06/01/2024; I have confirmed that all information answered by patient is correct and no changes since this date.  Cardiac Risk Factors include: advanced age (>39men, >36 women);male gender;hypertension;Other (see comment), Risk factor comments: CAD     Objective:    Today's Vitals   06/08/24 0936  Weight: 176 lb 3.2 oz (79.9 kg)  Height: 5' 9 (1.753 m)   Body mass index is 26.02 kg/m.     06/08/2024    9:31 AM 02/01/2024    9:20 AM 06/07/2023   10:35 AM 03/18/2021    6:36 AM 06/16/2019   11:00 AM 06/13/2019    9:35 AM 04/10/2019    3:37 PM  Advanced Directives  Does Patient Have a Medical Advance Directive? Yes Yes Yes Yes Yes Yes Yes  Type of Estate agent of [place];Living will Healthcare Power of [place];Living will Healthcare Power of [place];Living will Healthcare Power of [place];Living will Healthcare Power of [place];Living will Healthcare Power of [place];Living will Healthcare Power of [place];Living will  Does patient want to make changes to medical advance directive? No - Patient declined No - Patient declined   No - Patient declined   No - Patient declined   Copy of Healthcare Power of Attorney in Chart? Yes - validated most recent copy scanned in chart (See row information) No - copy requested No - copy requested Yes -  validated most recent copy scanned in chart (See row information) Yes - validated most recent copy scanned in chart (See row information)  Yes - validated most recent copy scanned in chart (See row information)  No - copy requested      Data saved with a previous flowsheet row definition    Current Medications (verified) Outpatient Encounter Medications as of 06/08/2024  Medication Sig   alfuzosin  (UROXATRAL ) 10 MG 24 hr tablet TAKE 1 TABLET BY MOUTH EVERY DAY IN THE EVENING. Patient to follow up with PCP prior to future refills   ALPRAZolam  (XANAX ) 0.5 MG tablet Take 1 tablet (0.5 mg total) by mouth 2 (two) times daily as needed for anxiety.   aspirin  EC 81 MG tablet Take 81 mg by mouth every 3 (three) days. Swallow whole.   celecoxib  (CELEBREX ) 50 MG capsule TAKE 1 CAPSULE BY MOUTH 2 TIMES DAILY.   desoximetasone (TOPICORT) 0.25 % cream Apply 1 Application topically every evening.   fluticasone  (CUTIVATE ) 0.05 % cream Apply 1 Application topically.   Multiple Vitamin (MULTIVITAMIN WITH MINERALS) TABS tablet Take 1 tablet by mouth daily.   olmesartan  (BENICAR ) 20 MG tablet TAKE 1 TABLET BY MOUTH EVERY DAY   pantoprazole  (PROTONIX ) 40 MG tablet TAKE 1 TABLET BY MOUTH EVERY DAY 30 MINUTES BEFORE EVENING MEALS   rosuvastatin  (CRESTOR ) 10 MG tablet TAKE 1 TABLET BY MOUTH EVERYDAY AT BEDTIME   Tafamidis  Meglumine , Cardiac, (VYNDAQEL ) 20 MG CAPS Take 4 capsules (80 mg total) by mouth daily.   traMADol  (ULTRAM ) 50  MG tablet Take 50 mg by mouth 2 (two) times daily.   amLODipine  (NORVASC ) 5 MG tablet TAKE 1 TABLET (5 MG TOTAL) BY MOUTH DAILY. (Patient not taking: Reported on 05/31/2024)   atenolol  (TENORMIN ) 25 MG tablet Take 1 tablet (25 mg total) by mouth 2 (two) times daily. (Patient not taking: Reported on 05/31/2024)   indapamide  (LOZOL ) 1.25 MG tablet TAKE 1 TABLET BY MOUTH DAILY. (Patient not taking: Reported on 06/08/2024)   No facility-administered encounter medications on file as of 06/08/2024.     Allergies (verified) Gentamycin [gentamicin], Turmeric, Bacitracin , Hydrocodone , Moxifloxacin, Neosporin [neomycin-bacitracin  zn-polymyx], Other, Polymyxin b, Promethazine , Tape, and Triamcinolone acetonide   History: Past Medical History:  Diagnosis Date   Allergic rhinitis    Anxiety    Cancer (HCC)    Melanoma -peri umbilical '02 or '03- no further problems. 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 Right TMJ issue  chills in past   Dilated aortic root (HCC) 03/06/2024   TTE 03/02/24: EF 65-70, no RWMA, GLS -24.3, NL RVSF, RVSP 37.1 (mildly elevated PASP), mild LAE, trivial MR, mildly dilated aortic root (40 mm)    Dizziness    Dizziness with question of presyncope April 08, 2011   DJD (degenerative joint disease)    history DDD, fingers, knees, shoulders   Dysrhythmia 2025   skipped beats   Ejection fraction    EF 55%, echo, March, 2008.   Family history of Alzheimer's disease    GERD (gastroesophageal reflux disease)    Hearing impaired    bilateral hearing aids   Heart murmur    78 yrs old rheumatic fever    Heart murmur 2008   trivial tricusp regurg   History of colonic polyps    History of hiatal hernia    History of melanoma    History of pseudogout    History of recent steroid use    10-10-15 tapering steroid use for tx. recent bronchitis.   History of rheumatic fever    HNP (herniated nucleus pulposus), cervical    Hypercholesteremia    borderline   Hypertension    Lumbar spondylosis with myelopathy 06/09/2013   Spondylosis    Tinnitus    chronic   TMJ click    rightwas aggravated with last intubation   Past  Surgical History:  Procedure Laterality Date   ANTERIOR CERVICAL DECOMP/DISCECTOMY FUSION  2003   ANTERIOR CERVICAL DECOMP/DISCECTOMY FUSION N/A 05/18/2018   Procedure: ANTERIOR CERVICAL DECOMPRESSION/DISCECTOMY FUSION - CERVICAL FIVE-CERVICAL SIX, removal of tether plate;  Surgeon: Alix Charleston, MD;  Location: [facility];  Service: Neurosurgery;  Laterality: N/A;   BACK SURGERY     x3   CARPAL TUNNEL RELEASE Right 10/31/2013   Procedure: RIGHT CARPAL TUNNEL RELEASE;  Surgeon: Charleston LULLA Leonor Mickey., MD;  Location: [facility];  Service: Orthopedics;  Laterality: Right;   CARPAL TUNNEL RELEASE Left 11/28/2013   Procedure: LEFT CARPAL TUNNEL RELEASE;  Surgeon: Charleston LULLA Leonor Mickey., MD;  Location:  SURGERY CENTER;  Service: Orthopedics;  Laterality: Left;   CERVICAL DISCECTOMY     anterior with patellar allograft and plating   JOINT  REPLACEMENT     right knee 3 years ago   LEFT HEART CATH AND CORONARY ANGIOGRAPHY N/A 03/18/2021   Procedure: LEFT HEART CATH AND CORONARY ANGIOGRAPHY;  Surgeon: Claudene Victory ORN, MD;  Location: [facility];  Service: Cardiovascular;  Laterality: N/A;   LUMBAR FUSION  12/14   MELANOMA EXCISION WITH SENTINEL LYMPH NODE BIOPSY  8/02   bilat ing node bx   POSTERIOR CERVICAL LAMINECTOMY WITH MET- RX Left 02/14/2024   Procedure: POSTERIOR CERVICAL FORAMINOTOMY WITH MET- RX CERVICAL THREE-CERVICAL FOUR LEFT;  Surgeon: Colon Victory, MD;  Location: [facility];  Service: Neurosurgery;  Laterality: Left;   ROTATOR CUFF REPAIR Left    08/13/11  08/27/2011   TOTAL KNEE ARTHROPLASTY Right 10/07/2015   Procedure: RIGHT TOTAL KNEE ARTHROPLASTY, with synovial tissue specimen;  Surgeon: Dempsey Moan, MD;  Location: [facility];  Service: Orthopedics;  Laterality: Right;   TOTAL KNEE ARTHROPLASTY Left 06/16/2019   Procedure: LEFT TOTAL KNEE ARTHROPLASTY;  Surgeon: Vernetta Lonni GRADE, MD;  Location: [facility];  Service: Orthopedics;  Laterality: Left;   Family History   Problem Relation Age of Onset   Alzheimer's disease Father    Alzheimer's disease Mother    Social History   Socioeconomic History   Marital status: Married    Spouse name: [name]   Number of children: 2   Years of education: MD   Highest education level: Professional school degree (e.g., MD, DDS, DVM, JD)  Occupational History   Occupation: RADIOLOGIST    Employer: [employer]   Occupation: RETIRED  Tobacco Use   Smoking status: Never   Smokeless tobacco: Never  Vaping Use   Vaping status: Never Used  Substance and Sexual Activity   Alcohol use: Yes    Alcohol/week: 1.0 standard drink of alcohol    Types: 1 Glasses of wine per week   Drug use: No   Sexual activity: Yes    Partners: Female  Other Topics Concern   Not on file  Social History Narrative   Married - wife = [name]   2 children- Daughter=lawyer, Son=physician   Non smoker   Exercises 3 times per week   Caffeine use: 2 cups/d   etoh use: 1-2 glasses of wine a couple of days every week   Social Drivers of Corporate investment banker Strain: Low Risk  (06/01/2024)   Overall Financial Resource Strain (CARDIA)    Difficulty of Paying Living Expenses: Not hard at all  Food Insecurity: No Food Insecurity (06/01/2024)   Hunger Vital Sign    Worried About Running Out of Food in the Last Year: Never true    Ran Out of Food in the Last Year: Never true  Transportation Needs: No Transportation Needs (06/01/2024)   PRAPARE - Administrator, Civil Service (Medical): No    Lack of Transportation (Non-Medical): No  Physical Activity: Insufficiently Active (06/01/2024)   Exercise Vital Sign    Days of Exercise per Week: 3 days    Minutes of Exercise per Session: 40 min  Stress: No Stress Concern Present (06/01/2024)   [employer] of Occupational Health - Occupational Stress Questionnaire    Feeling of Stress: Not at all  Social Connections: Socially Integrated (06/01/2024)   Social Connection and  Isolation Panel    Frequency of Communication with Friends and Family: More than three times a week    Frequency of Social Gatherings with Friends and Family: Twice a week    Attends Religious Services:  1 to 4 times per year    Active Member of Clubs or Organizations: Yes    Attends Banker Meetings: 1 to 4 times per year    Marital Status: Married    Tobacco Counseling Counseling given: Not Answered    Clinical Intake:  Pre-visit preparation completed: Yes  Pain : No/denies pain     BMI - recorded: 26.02 Nutritional Status: BMI 25 -29 Overweight  No results found for: HGBA1C   How often do you need to have someone help you when you read instructions, pamphlets, or other written materials from your doctor or pharmacy?: 1 - Never  Interpreter Needed?: No  Information entered by :: Kourosh Jablonsky, RMA   Activities of Daily Living     06/01/2024    9:47 AM 02/01/2024    9:19 AM  In your present state of health, do you have any difficulty performing the following activities:  Hearing? 0   Vision? 0   Difficulty concentrating or making decisions? 0   Walking or climbing stairs? 0   Dressing or bathing? 0   Doing errands, shopping? 0 0  Preparing Food and eating ? N   Using the Toilet? N   In the past six months, have you accidently leaked urine? N   Do you have problems with loss of bowel control? N   Managing your Medications? N   Managing your Finances? N   Housekeeping or managing your Housekeeping? N     Patient Care Team: Joshua Debby CROME, MD as PCP - General (Internal Medicine) Delford Maude BROCKS, MD as PCP - Cardiology (Cardiology) Cindie Ole DASEN, MD as PCP - Electrophysiology (Cardiology) Colon Shove, MD as Consulting Physician (Neurosurgery) Kennyth Cy RAMAN, DO (Osteopathic Medicine) Melodi Lerner, MD as Consulting Physician (Orthopedic Surgery) Delford Maude BROCKS, MD as Consulting Physician (Cardiology) Vernetta Lonni GRADE, MD as  Consulting Physician (Orthopedic Surgery) Fernande Elspeth BROCKS, MD as Consulting Physician (Cardiology) Patrcia Sharper, MD as Consulting Physician (Ophthalmology) Rolan Ezra RAMAN, MD as Consulting Physician (Cardiology)  I have updated your Care Teams any recent Medical Services you may have received from other providers in the past year.     Assessment:   This is a routine wellness examination for [place].  Hearing/Vision screen Hearing Screening - Comments:: Wears hearing aides  Vision Screening - Comments:: Wears eyeglasses/[facility]/ Dr. Patrcia   Goals Addressed             This Visit's Progress    Slow Disease Progression   Improving    Patient is initiating therapy. Patient will maintain adherence        Depression Screen     06/08/2024    9:49 AM 06/07/2023   10:41 AM 11/23/2022    2:24 PM 10/08/2022    4:22 PM 04/20/2014   10:00 AM  PHQ 2/9 Scores  PHQ - 2 Score 3 0 0 0 0  PHQ- 9 Score  0 0 0     Fall Risk     06/01/2024    9:47 AM 06/07/2023   10:35 AM 11/23/2022    2:23 PM  Fall Risk   Falls in the past year? 1 0 0  Number falls in past yr: 0 0 0  Injury with Fall? 0 0 0  Risk for fall due to :  No Fall Risks Impaired balance/gait;No Fall Risks  Follow up Falls evaluation completed;Falls prevention discussed Falls prevention discussed Falls evaluation completed    MEDICARE RISK AT HOME:  Medicare Risk at Home Any stairs in or around the home?: (Patient-Rptd) Yes If so, are there any without handrails?: (Patient-Rptd) No Home free of loose throw rugs in walkways, pet beds, electrical cords, etc?: (Patient-Rptd) Yes Adequate lighting in your home to reduce risk of falls?: (Patient-Rptd) Yes Life alert?: (Patient-Rptd) No Use of a cane, walker or w/c?: (Patient-Rptd) No Grab bars in the bathroom?: (Patient-Rptd) Yes Shower chair or bench in shower?: (Patient-Rptd) Yes Elevated toilet seat or a handicapped toilet?: (Patient-Rptd) Yes  TIMED  UP AND GO:  Was the test performed?  Yes  Length of time to ambulate 10 feet: 15 sec Gait steady and fast without use of assistive device  Cognitive Function: Declined/Normal: No cognitive concerns noted by patient or family. Patient alert, oriented, able to answer questions appropriately and recall recent events. No signs of memory loss or confusion.        06/07/2023   10:37 AM  6CIT Screen  What Year? 0 points  What month? 0 points  What time? 0 points  Count back from 20 0 points  Months in reverse 0 points  Repeat phrase 0 points  Total Score 0 points    Immunizations Immunization History  Administered Date(s) Administered   Fluad Trivalent(High Dose 65+) 08/05/2023   Influenza Split 08/15/2011, 08/20/2013, 07/19/2017   Influenza Whole 08/14/2009   Influenza, High Dose Seasonal PF 07/16/2016, 08/07/2022   Influenza-Unspecified 07/11/2015, 07/19/2016, 07/22/2017   Moderna Covid-19 Fall Seasonal Vaccine 61yrs & older 02/24/2024   Moderna Covid-19 Vaccine  Bivalent Booster 63yrs & up 07/05/2021   PFIZER(Purple Top)SARS-COV-2 Vaccination 11/06/2019, 11/24/2019, 06/12/2020, 01/22/2021, 07/23/2022, 01/03/2023   Pfizer Covid-19 Vaccine Bivalent Booster 64yrs & up 03/20/2022   Pneumococcal Conjugate-13 04/20/2014, 07/20/2019   Pneumococcal Polysaccharide-23 10/05/2012   Tdap 04/13/2011, 10/17/2022   Zoster Recombinant(Shingrix) 02/05/2017, 06/14/2017    Screening Tests Health Maintenance  Topic Date Due   Pneumococcal Vaccine: 50+ Years (3 of 3 - PCV20 or PCV21) 09/14/2019   INFLUENZA VACCINE  05/26/2024   COVID-19 Vaccine (10 - Pfizer risk 2024-25 season) 08/26/2024   Medicare Annual Wellness (AWV)  06/08/2025   DTaP/Tdap/Td (3 - Td or Tdap) 10/17/2032   Hepatitis C Screening  Completed   Zoster Vaccines- Shingrix  Completed   HPV VACCINES  Aged Out   Meningococcal B Vaccine  Aged Out   Colonoscopy  Discontinued    Health Maintenance  Health Maintenance Due   Topic Date Due   Pneumococcal Vaccine: 50+ Years (3 of 3 - PCV20 or PCV21) 09/14/2019   INFLUENZA VACCINE  05/26/2024   Health Maintenance Items Addressed: Pneumovax vaccine given, See Nurse Notes at the end of this note  Additional Screening:  Vision Screening: Recommended annual ophthalmology exams for early detection of glaucoma and other disorders of the eye. Would you like a referral to an eye doctor? No    Dental Screening: Recommended annual dental exams for proper oral hygiene  Community Resource Referral / Chronic Care Management: CRR required this visit?  No   CCM required this visit?  No   Plan:    I have personally reviewed and noted the following in the patient's chart:   Medical and social history Use of alcohol, tobacco or illicit drugs  Current medications and supplements including opioid prescriptions. Patient is currently taking opioid prescriptions. Information provided to patient regarding non-opioid alternatives. Patient advised to discuss non-opioid treatment plan with their provider. Functional ability and status Nutritional status Physical activity Advanced directives List of other physicians Hospitalizations,  surgeries, and ER visits in previous 12 months Vitals Screenings to include cognitive, depression, and falls Referrals and appointments  In addition, I have reviewed and discussed with patient certain preventive protocols, quality metrics, and best practice recommendations. A written personalized care plan for preventive services as well as general preventive health recommendations were provided to patient.   Eleri Ruben L Suzanne Kho, CMA   06/08/2024   After Visit Summary: (MyChart) Due to this being a telephonic visit, the after visit summary with patients personalized plan was offered to patient via MyChart   Notes: Nothing significant to report at this time.

## 2024-06-08 NOTE — Patient Instructions (Signed)
 Dr. Wadie , Thank you for taking time out of your busy schedule to complete your Annual Wellness Visit with me. I enjoyed our conversation and look forward to speaking with you again next year. I, as well as your care team,  appreciate your ongoing commitment to your health goals. Please review the following plan we discussed and let me know if I can assist you in the future. Your Game plan/ To Do List      Follow up Visits: We will see or speak with you next year for your Next Medicare AWV with our clinical staff Have you seen your provider in the last 6 months (3 months if uncontrolled diabetes)? Yes.  Last visit on 12/08/2023.  Clinician Recommendations:  Aim for 30 minutes of exercise or brisk walking, 6-8 glasses of water , and 5 servings of fruits and vegetables each day.  Keep up the good work.      This is a list of the screenings recommended for you:  Health Maintenance  Topic Date Due   Pneumococcal Vaccine for age over 80 (3 of 3 - PCV20 or PCV21) 09/14/2019   Medicare Annual Wellness Visit  06/06/2024   Flu Shot  05/26/2024   COVID-19 Vaccine (10 - Pfizer risk 2024-25 season) 08/26/2024   DTaP/Tdap/Td vaccine (3 - Td or Tdap) 10/17/2032   Hepatitis C Screening  Completed   Zoster (Shingles) Vaccine  Completed   HPV Vaccine  Aged Out   Meningitis B Vaccine  Aged Out   Colon Cancer Screening  Discontinued    Advanced directives: (In Chart) A copy of your advanced directives are scanned into your chart should your provider ever need it. Advance Care Planning is important because it:  [x]  Makes sure you receive the medical care that is consistent with your values, goals, and preferences  [x]  It provides guidance to your family and loved ones and reduces their decisional burden about whether or not they are making the right decisions based on your wishes.  Follow the link provided in your after visit summary or read over the paperwork we have mailed to you to help you started  getting your Advance Directives in place. If you need assistance in completing these, please reach out to us  so that we can help you!  See attachments for Preventive Care and Fall Prevention Tips.  Managing Pain Without Opioids Opioids are strong medicines used to treat moderate to severe pain. For some people, especially those who have long-term (chronic) pain, opioids may not be the best choice for pain management due to: Side effects like nausea, constipation, and sleepiness. The risk of addiction (opioid use disorder). The longer you take opioids, the greater your risk of addiction. Pain that lasts for more than 3 months is called chronic pain. Managing chronic pain usually requires more than one approach and is often provided by a team of health care providers working together (multidisciplinary approach). Pain management may be done at a pain management center or pain clinic. How to manage pain without the use of opioids Use non-opioid medicines Non-opioid medicines for pain may include: Over-the-counter or prescription non-steroidal anti-inflammatory drugs (NSAIDs). These may be the first medicines used for pain. They work well for muscle and bone pain, and they reduce swelling. Acetaminophen . This over-the-counter medicine may work well for milder pain but not swelling. Antidepressants. These may be used to treat chronic pain. A certain type of antidepressant (tricyclics) is often used. These medicines are given in lower doses for pain  than when used for depression. Anticonvulsants. These are usually used to treat seizures but may also reduce nerve (neuropathic) pain. Muscle relaxants. These relieve pain caused by sudden muscle tightening (spasms). You may also use a pain medicine that is applied to the skin as a patch, cream, or gel (topical analgesic), such as a numbing medicine. These may cause fewer side effects than medicines taken by mouth. Do certain therapies as directed Some  therapies can help with pain management. They include: Physical therapy. You will do exercises to gain strength and flexibility. A physical therapist may teach you exercises to move and stretch parts of your body that are weak, stiff, or painful. You can learn these exercises at physical therapy visits and practice them at home. Physical therapy may also involve: Massage. Heat wraps or applying heat or cold to affected areas. Electrical signals that interrupt pain signals (transcutaneous electrical nerve stimulation, TENS). Weak lasers that reduce pain and swelling (low-level laser therapy). Signals from your body that help you learn to regulate pain (biofeedback). Occupational therapy. This helps you to learn ways to function at home and work with less pain. Recreational therapy. This involves trying new activities or hobbies, such as a physical activity or drawing. Mental health therapy, including: Cognitive behavioral therapy (CBT). This helps you learn coping skills for dealing with pain. Acceptance and commitment therapy (ACT) to change the way you think and react to pain. Relaxation therapies, including muscle relaxation exercises and mindfulness-based stress reduction. Pain management counseling. This may be individual, family, or group counseling.  Receive medical treatments Medical treatments for pain management include: Nerve block injections. These may include a pain blocker and anti-inflammatory medicines. You may have injections: Near the spine to relieve chronic back or neck pain. Into joints to relieve back or joint pain. Into nerve areas that supply a painful area to relieve body pain. Into muscles (trigger point injections) to relieve some painful muscle conditions. A medical device placed near your spine to help block pain signals and relieve nerve pain or chronic back pain (spinal cord stimulation device). Acupuncture. Follow these instructions at home Medicines Take  over-the-counter and prescription medicines only as told by your health care provider. If you are taking pain medicine, ask your health care providers about possible side effects to watch out for. Do not drive or use heavy machinery while taking prescription opioid pain medicine. Lifestyle  Do not use drugs or alcohol to reduce pain. If you drink alcohol, limit how much you have to: 0-1 drink a day for women who are not pregnant. 0-2 drinks a day for men. Know how much alcohol is in a drink. In the [place] one drink equals one 12 oz bottle of beer (355 mL), one 5 oz glass of wine (148 mL), or one 1 oz glass of hard liquor (44 mL). Do not use any products that contain nicotine or tobacco. These products include cigarettes, chewing tobacco, and vaping devices, such as e-cigarettes. If you need help quitting, ask your health care provider. Eat a healthy diet and maintain a healthy weight. Poor diet and excess weight may make pain worse. Eat foods that are high in fiber. These include fresh fruits and vegetables, whole grains, and beans. Limit foods that are high in fat and processed sugars, such as fried and sweet foods. Exercise regularly. Exercise lowers stress and may help relieve pain. Ask your health care provider what activities and exercises are safe for you. If your health care provider approves, join an  exercise class that combines movement and stress reduction. Examples include yoga and tai chi. Get enough sleep. Lack of sleep may make pain worse. Lower stress as much as possible. Practice stress reduction techniques as told by your therapist. General instructions Work with all your pain management providers to find the treatments that work best for you. You are an important member of your pain management team. There are many things you can do to reduce pain on your own. Consider joining an online or in-person support group for people who have chronic pain. Keep all follow-up visits. This  is important. Where to find more information You can find more information about managing pain without opioids from: American Academy of Pain Medicine: painmed.org Institute for Chronic Pain: instituteforchronicpain.org American Chronic Pain Association: theacpa.org Contact a health care provider if: You have side effects from pain medicine. Your pain gets worse or does not get better with treatments or home therapy. You are struggling with anxiety or depression. Summary Many types of pain can be managed without opioids. Chronic pain may respond better to pain management without opioids. Pain is best managed when you and a team of health care providers work together. Pain management without opioids may include non-opioid medicines, medical treatments, physical therapy, mental health therapy, and lifestyle changes. Tell your health care providers if your pain gets worse or is not being managed well enough. This information is not intended to replace advice given to you by your health care provider. Make sure you discuss any questions you have with your health care provider. Document Revised: 01/22/2021 Document Reviewed: 01/22/2021 Elsevier Patient Education  2024 [employer].

## 2024-06-08 NOTE — Addendum Note (Signed)
 Addended by: LENARD WILFORD RAMAN on: 06/08/2024 01:47 PM   Modules accepted: Orders

## 2024-06-10 ENCOUNTER — Other Ambulatory Visit: Payer: Self-pay | Admitting: Internal Medicine

## 2024-06-10 DIAGNOSIS — I10 Essential (primary) hypertension: Secondary | ICD-10-CM

## 2024-06-12 ENCOUNTER — Ambulatory Visit (HOSPITAL_COMMUNITY): Payer: Self-pay | Admitting: Cardiology

## 2024-06-12 ENCOUNTER — Other Ambulatory Visit: Payer: Self-pay | Admitting: Cardiovascular Disease

## 2024-06-12 ENCOUNTER — Observation Stay (HOSPITAL_COMMUNITY)
Admission: RE | Admit: 2024-06-12 | Discharge: 2024-06-12 | Disposition: A | Discharge status: Home / Self Care | Source: Ambulatory Visit | Attending: Cardiovascular Disease | Admitting: Cardiovascular Disease

## 2024-06-12 DIAGNOSIS — R9439 Abnormal result of other cardiovascular function study: Secondary | ICD-10-CM | POA: Insufficient documentation

## 2024-06-12 DIAGNOSIS — E854 Organ-limited amyloidosis: Secondary | ICD-10-CM

## 2024-06-12 DIAGNOSIS — I43 Cardiomyopathy in diseases classified elsewhere: Secondary | ICD-10-CM | POA: Insufficient documentation

## 2024-06-12 MED ORDER — GADOBUTROL 1 MMOL/ML IV SOLN
10.0000 mL | Freq: Once | INTRAVENOUS | Status: AC | PRN
  Administered 2024-06-12: 10 mL via INTRAVENOUS

## 2024-06-14 ENCOUNTER — Other Ambulatory Visit (HOSPITAL_COMMUNITY): Payer: Self-pay

## 2024-06-14 ENCOUNTER — Other Ambulatory Visit: Payer: Self-pay

## 2024-06-14 NOTE — Progress Notes (Signed)
 Clinical Intervention Note  Clinical Intervention Notes: Patient reported balance issues, dizziness, fatigue, and peripheral neuropathy. Patient planning to consult MD about these conditions. 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   Clinical Intervention Outcomes: Prevention of an adverse drug event   Powell CHRISTELLA Gallus Specialty Pharmacist

## 2024-06-14 NOTE — Progress Notes (Signed)
 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

## 2024-06-15 ENCOUNTER — Other Ambulatory Visit: Payer: Self-pay | Admitting: Internal Medicine

## 2024-06-15 DIAGNOSIS — I10 Essential (primary) hypertension: Secondary | ICD-10-CM

## 2024-06-19 ENCOUNTER — Other Ambulatory Visit (HOSPITAL_COMMUNITY): Payer: Self-pay

## 2024-06-19 ENCOUNTER — Telehealth (HOSPITAL_COMMUNITY): Payer: Self-pay | Admitting: Pharmacist

## 2024-06-19 NOTE — Telephone Encounter (Signed)
 Advanced Heart Failure Patient Advocate Encounter  Prior Authorization appeal for Attruby  has been submitted to [facility] Medicare. Standard appeals should have determination within 1 week.  Also submitted form for 28 day free trial of Attruby  through [employer] program.   Tinnie Redman, PharmD, BCPS, BCCP, CPP Heart Failure Clinic Pharmacist [phone]

## 2024-06-19 NOTE — Telephone Encounter (Signed)
 Patient called to note persistent adverse effects since starting Vyndaqel . Noted diminished balance, lightheadedness and increased fatigue. Will add to allergy/intolerance list and remove from medication list. Discussed with Dr. 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    Tinnie Redman, PharmD, BCPS, BCCP, CPP Heart Failure Clinic Pharmacist [phone]

## 2024-06-20 ENCOUNTER — Other Ambulatory Visit (HOSPITAL_COMMUNITY): Payer: Self-pay

## 2024-06-20 DIAGNOSIS — M47812 Spondylosis without myelopathy or radiculopathy, cervical region: Secondary | ICD-10-CM | POA: Diagnosis not present

## 2024-06-20 DIAGNOSIS — M542 Cervicalgia: Secondary | ICD-10-CM | POA: Diagnosis not present

## 2024-06-20 DIAGNOSIS — R269 Unspecified abnormalities of gait and mobility: Secondary | ICD-10-CM | POA: Diagnosis not present

## 2024-06-20 DIAGNOSIS — M6281 Muscle weakness (generalized): Secondary | ICD-10-CM | POA: Diagnosis not present

## 2024-06-20 MED ORDER — ATTRUBY 356 MG PO TBPK: 712.0000 mg | ORAL_TABLET | Freq: Two times a day (BID) | ORAL | Status: DC

## 2024-06-20 NOTE — Telephone Encounter (Signed)
 Advanced Heart Failure Patient Advocate Encounter  Prior Authorization for Attruby  has been approved.    Effective dates: 05/31/24 through until further notice  Patients co-pay is $0.00 (has met out of pocket for the year).  Just received 28 day free trial from [employer]. Once needs a refill he will reach out and we will get medication sent to [facility]. Will not ship sooner as he is going out of the country soon.   Tinnie Redman, PharmD, BCPS, BCCP, CPP Heart Failure Clinic Pharmacist [phone]

## 2024-07-06 ENCOUNTER — Encounter (INDEPENDENT_AMBULATORY_CARE_PROVIDER_SITE_OTHER): Payer: Self-pay

## 2024-07-07 ENCOUNTER — Other Ambulatory Visit (HOSPITAL_COMMUNITY): Payer: Self-pay

## 2024-07-07 NOTE — Telephone Encounter (Signed)
 Patient submitted a MyChart questionnaire in regards to Attruby . Vyndaqel  was discontinued on 06/19/24 due to side effects. Patient recently got a free trial of Attruby  through [employer]. Patient reported to be going out of town at the beginning of October. Patient requested if he could pick up a supply of Attruby  on 07/26/24 before he heads out of town/out of the country. Message was sent to [facility] for further assistance. Stephaine informed Ole that she will reach out to Lauren, Rph to get a prescription sent in before he leaves. Since patient has reported to have enough supply until 07/29/24, Lauren & Rachel will reach out to patient on Monday 07/10/24 to take care of the patient.

## 2024-07-07 NOTE — Telephone Encounter (Signed)
 Teams message sent to Stephaine Henry on how to proceed. Will update notes once more information has been received.

## 2024-07-10 ENCOUNTER — Other Ambulatory Visit (HOSPITAL_COMMUNITY): Payer: Self-pay

## 2024-07-10 ENCOUNTER — Other Ambulatory Visit: Payer: Self-pay

## 2024-07-10 ENCOUNTER — Other Ambulatory Visit (HOSPITAL_COMMUNITY): Payer: Self-pay | Admitting: Pharmacist

## 2024-07-10 MED ORDER — ATTRUBY 356 MG PO TBPK
712.0000 mg | ORAL_TABLET | Freq: Two times a day (BID) | ORAL | 11 refills | Status: DC
  Filled 2024-07-10: qty 112, 28d supply, fill #0
  Filled 2024-08-11 – 2024-08-25 (×2): qty 112, 28d supply, fill #1

## 2024-07-10 NOTE — Progress Notes (Signed)
 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   No data recorded   Goals Addressed             This Visit's Progress    Slow Disease Progression       Patient is initiating therapy. Patient will maintain adherence         Gelena Klosinski CHRISTELLA Redman Specialty Pharmacist

## 2024-07-10 NOTE — Progress Notes (Signed)
 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   Patient is aware of $0 copayment.

## 2024-07-18 ENCOUNTER — Other Ambulatory Visit (HOSPITAL_COMMUNITY)

## 2024-07-19 ENCOUNTER — Ambulatory Visit: Payer: Self-pay | Admitting: Internal Medicine

## 2024-07-19 ENCOUNTER — Ambulatory Visit (INDEPENDENT_AMBULATORY_CARE_PROVIDER_SITE_OTHER): Admitting: Internal Medicine

## 2024-07-19 ENCOUNTER — Encounter: Payer: Self-pay | Admitting: Internal Medicine

## 2024-07-19 ENCOUNTER — Other Ambulatory Visit: Payer: Self-pay

## 2024-07-19 VITALS — BP 132/74 | HR 83 | Temp 97.9°F | Resp 16 | Ht 69.0 in | Wt 175.2 lb

## 2024-07-19 DIAGNOSIS — F419 Anxiety disorder, unspecified: Secondary | ICD-10-CM | POA: Insufficient documentation

## 2024-07-19 DIAGNOSIS — K219 Gastro-esophageal reflux disease without esophagitis: Secondary | ICD-10-CM

## 2024-07-19 DIAGNOSIS — E854 Organ-limited amyloidosis: Secondary | ICD-10-CM | POA: Diagnosis not present

## 2024-07-19 DIAGNOSIS — I2510 Atherosclerotic heart disease of native coronary artery without angina pectoris: Secondary | ICD-10-CM | POA: Insufficient documentation

## 2024-07-19 DIAGNOSIS — E785 Hyperlipidemia, unspecified: Secondary | ICD-10-CM | POA: Diagnosis not present

## 2024-07-19 DIAGNOSIS — N401 Enlarged prostate with lower urinary tract symptoms: Secondary | ICD-10-CM | POA: Diagnosis not present

## 2024-07-19 DIAGNOSIS — I43 Cardiomyopathy in diseases classified elsewhere: Secondary | ICD-10-CM | POA: Diagnosis not present

## 2024-07-19 DIAGNOSIS — E875 Hyperkalemia: Secondary | ICD-10-CM | POA: Diagnosis not present

## 2024-07-19 DIAGNOSIS — N400 Enlarged prostate without lower urinary tract symptoms: Secondary | ICD-10-CM | POA: Insufficient documentation

## 2024-07-19 DIAGNOSIS — R413 Other amnesia: Secondary | ICD-10-CM | POA: Insufficient documentation

## 2024-07-19 DIAGNOSIS — N529 Male erectile dysfunction, unspecified: Secondary | ICD-10-CM | POA: Insufficient documentation

## 2024-07-19 DIAGNOSIS — G629 Polyneuropathy, unspecified: Secondary | ICD-10-CM | POA: Insufficient documentation

## 2024-07-19 DIAGNOSIS — R3912 Poor urinary stream: Secondary | ICD-10-CM

## 2024-07-19 DIAGNOSIS — N1831 Chronic kidney disease, stage 3a: Secondary | ICD-10-CM | POA: Diagnosis not present

## 2024-07-19 DIAGNOSIS — D039 Melanoma in situ, unspecified: Secondary | ICD-10-CM | POA: Insufficient documentation

## 2024-07-19 DIAGNOSIS — R269 Unspecified abnormalities of gait and mobility: Secondary | ICD-10-CM | POA: Insufficient documentation

## 2024-07-19 DIAGNOSIS — I10 Essential (primary) hypertension: Secondary | ICD-10-CM | POA: Diagnosis not present

## 2024-07-19 DIAGNOSIS — Z820 Family history of epilepsy and other diseases of the nervous system: Secondary | ICD-10-CM | POA: Insufficient documentation

## 2024-07-19 DIAGNOSIS — H9190 Unspecified hearing loss, unspecified ear: Secondary | ICD-10-CM | POA: Insufficient documentation

## 2024-07-19 DIAGNOSIS — Z8679 Personal history of other diseases of the circulatory system: Secondary | ICD-10-CM | POA: Insufficient documentation

## 2024-07-19 DIAGNOSIS — K2100 Gastro-esophageal reflux disease with esophagitis, without bleeding: Secondary | ICD-10-CM | POA: Insufficient documentation

## 2024-07-19 LAB — CBC WITH DIFFERENTIAL/PLATELET
Basophils Absolute: 0.1 K/uL (ref 0.0–0.1)
Basophils Relative: 0.8 % (ref 0.0–3.0)
Eosinophils Absolute: 0.3 K/uL (ref 0.0–0.7)
Eosinophils Relative: 3.5 % (ref 0.0–5.0)
HCT: 43.7 % (ref 39.0–52.0)
Hemoglobin: 14.5 g/dL (ref 13.0–17.0)
Lymphocytes Relative: 13.1 % (ref 12.0–46.0)
Lymphs Abs: 1.1 K/uL (ref 0.7–4.0)
MCHC: 33.2 g/dL (ref 30.0–36.0)
MCV: 91.1 fl (ref 78.0–100.0)
Monocytes Absolute: 1 K/uL (ref 0.1–1.0)
Monocytes Relative: 12.5 % — ABNORMAL HIGH (ref 3.0–12.0)
Neutro Abs: 5.8 K/uL (ref 1.4–7.7)
Neutrophils Relative %: 70.1 % (ref 43.0–77.0)
Platelets: 300 K/uL (ref 150.0–400.0)
RBC: 4.8 Mil/uL (ref 4.22–5.81)
RDW: 12.6 % (ref 11.5–15.5)
WBC: 8.3 K/uL (ref 4.0–10.5)

## 2024-07-19 LAB — CK: Total CK: 254 U/L — ABNORMAL HIGH (ref 17–232)

## 2024-07-19 LAB — BASIC METABOLIC PANEL WITH GFR
BUN: 24 mg/dL — ABNORMAL HIGH (ref 6–23)
CO2: 29 meq/L (ref 19–32)
Calcium: 10.1 mg/dL (ref 8.4–10.5)
Chloride: 102 meq/L (ref 96–112)
Creatinine, Ser: 1.28 mg/dL (ref 0.40–1.50)
GFR: 54.39 mL/min — ABNORMAL LOW (ref >60.00)
Glucose, Bld: 96 mg/dL (ref 70–99)
Potassium: 5.7 meq/L — ABNORMAL HIGH (ref 3.5–5.1)
Sodium: 139 meq/L (ref 135–145)

## 2024-07-19 LAB — URINALYSIS, ROUTINE W REFLEX MICROSCOPIC
Bilirubin Urine: NEGATIVE
Hgb urine dipstick: NEGATIVE
Ketones, ur: NEGATIVE
Leukocytes,Ua: NEGATIVE
Nitrite: NEGATIVE
RBC / HPF: NONE SEEN (ref 0–?)
Specific Gravity, Urine: <=1.005 — AB (ref 1.000–1.030)
Total Protein, Urine: NEGATIVE
Urine Glucose: NEGATIVE
Urobilinogen, UA: 0.2 (ref 0.0–1.0)
pH: 7 (ref 5.0–8.0)

## 2024-07-19 LAB — HEPATIC FUNCTION PANEL
ALT: 23 U/L (ref 0–53)
AST: 26 U/L (ref 0–37)
Albumin: 4.7 g/dL (ref 3.5–5.2)
Alkaline Phosphatase: 56 U/L (ref 39–117)
Bilirubin, Direct: 0.2 mg/dL (ref 0.0–0.3)
Total Bilirubin: 0.6 mg/dL (ref 0.2–1.2)
Total Protein: 7 g/dL (ref 6.0–8.3)

## 2024-07-19 LAB — TSH: TSH: 1.57 u[IU]/mL (ref 0.35–5.50)

## 2024-07-19 LAB — PSA: PSA: 2.1 ng/mL (ref 0.10–4.00)

## 2024-07-19 NOTE — Progress Notes (Unsigned)
 Subjective:  Patient ID: Peter JONETTA [name], MD, male    DOB: Jan 05, 1946  Age: 78 y.o. MRN: [id number]  CC: Hypertension   HPI Peter JONETTA [name], MD presents for f/p  Discussed the use of AI scribe software for clinical note transcription with the patient, who gave verbal consent to proceed.  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  He has a history of cardiac amyloidosis, confirmed by a positive hydrophosphate cardiac scan and cardiac MRI. His cardiac ejection fraction is 62%.  He experiences peripheral neuropathy, which was previously unexplained by his chronic bronchitis. He underwent electrophoresis studies, which were negative, and has had three EMGs and nerve conduction studies in the past, which were deemed idiopathic at the time. He now attributes these symptoms to amyloidosis.  He experiences PVCs and occasional palpitations, which he suspects may be related to his cardiac amyloidosis. No swelling in his legs or trouble breathing recently. He also mentions experiencing bruising and easy bleeding.  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 He denies any muscle aches from his statin medication.     Outpatient Medications Prior to Visit  Medication Sig Dispense Refill   Acoramidis , 712MG  Twice Daily, (ATTRUBY ) 356 MG TBPK Take 712 mg by mouth 2 (two) times daily. 112 each 11   alfuzosin  (UROXATRAL ) 10 MG 24 hr tablet TAKE 1 TABLET BY MOUTH EVERY DAY IN THE EVENING. Patient to follow up with PCP prior to future refills 90 tablet 0   ALPRAZolam  (XANAX ) 0.5 MG tablet Take 1 tablet (0.5 mg total) by mouth 2 (two) times daily as needed for anxiety. 180 tablet 0   aspirin  EC 81 MG tablet Take 81 mg by mouth every 3 (three) days. Swallow whole.     celecoxib  (CELEBREX ) 50 MG capsule TAKE 1 CAPSULE BY MOUTH 2 TIMES DAILY. 180 capsule 0   desoximetasone  (TOPICORT) 0.25 % cream Apply 1 Application topically every evening.     fluticasone  (CUTIVATE ) 0.05 % cream Apply 1 Application topically.     Multiple Vitamin (MULTIVITAMIN WITH MINERALS) TABS tablet Take 1 tablet by mouth daily.     pantoprazole  (PROTONIX ) 40 MG tablet TAKE 1 TABLET BY MOUTH EVERY DAY 30 MINUTES BEFORE EVENING MEALS 90 tablet 1   rosuvastatin  (CRESTOR ) 10 MG tablet TAKE 1 TABLET BY MOUTH EVERYDAY AT BEDTIME 90 tablet 1   traMADol  (ULTRAM ) 50 MG tablet Take 50 mg by mouth 2 (two) times daily.     olmesartan  (BENICAR ) 20 MG tablet TAKE 1 TABLET BY MOUTH EVERY DAY 90 tablet 0   amLODipine  (NORVASC ) 5 MG tablet TAKE 1 TABLET (5 MG TOTAL) BY MOUTH DAILY. (Patient not taking: Reported on 05/31/2024) 90 tablet 0   atenolol  (TENORMIN ) 25 MG tablet Take 1 tablet (25 mg total) by mouth 2 (two) times daily. (Patient not taking: Reported on 05/31/2024) 90 tablet 3   indapamide  (LOZOL ) 1.25 MG tablet TAKE 1 TABLET BY MOUTH DAILY. (Patient not taking: Reported on 06/08/2024) 90 tablet 0   No facility-administered medications prior to visit.    ROS Review of Systems  Constitutional:  Negative for appetite change, chills, diaphoresis, fatigue and fever.  HENT: Negative.    Eyes: Negative.  Negative for visual disturbance.  Respiratory: Negative.  Negative for chest tightness, shortness of breath and wheezing.   Cardiovascular:  Positive for palpitations. Negative for  chest pain and leg swelling.  Gastrointestinal:  Negative for abdominal pain, constipation, diarrhea, nausea and vomiting.  Genitourinary: Negative.  Negative for difficulty urinating and dysuria.  Musculoskeletal:  Positive for arthralgias, gait problem and neck pain. Negative for joint swelling and myalgias.  Skin: Negative.  Negative for color change.  Neurological:  Negative for dizziness and weakness.  Hematological:  Negative for adenopathy. Does not bruise/bleed easily.  Psychiatric/Behavioral: Negative.       Objective:  BP 132/74 (BP Location: Left Arm, Patient Position: Sitting, Cuff Size: Normal)   Pulse 83   Temp 97.9 F (36.6 C) (Oral)   Resp 16   Ht 5' 9 (1.753 m)   Wt 175 lb 3.2 oz (79.5 kg)   SpO2 98%   BMI 25.87 kg/m   BP Readings from Last 3 Encounters:  07/19/24 132/74  06/08/24 120/82  05/31/24 110/70    Wt Readings from Last 3 Encounters:  07/19/24 175 lb 3.2 oz (79.5 kg)  06/08/24 176 lb 3.2 oz (79.9 kg)  05/31/24 175 lb 6.4 oz (79.6 kg)    Physical Exam Vitals reviewed.  Constitutional:      Appearance: Normal appearance.  HENT:     Mouth/Throat:     Mouth: Mucous membranes are moist.  Eyes:     General: No scleral icterus.    Conjunctiva/sclera: Conjunctivae normal.  Cardiovascular:     Rate and Rhythm: Normal rate and regular rhythm.     Heart sounds: No murmur heard.    No friction rub. No gallop.  Pulmonary:     Effort: Pulmonary effort is normal.     Breath sounds: No stridor. No wheezing, rhonchi or rales.  Abdominal:     General: Abdomen is flat.     Palpations: There is no mass.     Tenderness: There is no abdominal tenderness. There is no guarding.     Hernia: No hernia is present.  Musculoskeletal:        General: Normal range of motion.     Cervical back: Neck supple.     Right lower leg: No edema.     Left lower leg: No edema.  Lymphadenopathy:     Cervical: No cervical adenopathy.  Skin:    General: Skin is warm and dry.  Neurological:     General: No focal deficit present.     Mental Status: He is alert.  Psychiatric:        Mood and Affect: Mood normal.        Behavior: Behavior normal.     Lab Results  Component Value Date   WBC 8.3 07/19/2024   HGB 14.5 07/19/2024   HCT 43.7 07/19/2024   PLT 300.0 07/19/2024   GLUCOSE 96 07/19/2024   CHOL 138 05/31/2024   TRIG 92 05/31/2024   HDL 56 05/31/2024   LDLDIRECT 151.6 04/10/2011   LDLCALC 64 05/31/2024   ALT 23 07/19/2024   AST 26 07/19/2024   NA 139 07/19/2024    K 5.7 No hemolysis seen (H) 07/19/2024   CL 102 07/19/2024   CREATININE 1.28 07/19/2024   BUN 24 (H) 07/19/2024   CO2 29 07/19/2024   TSH 1.57 07/19/2024   PSA 2.10 07/19/2024   INR 1.0 03/09/2017    MR CARDIAC MORPHOLOGY W WO CONTRAST Result Date: 06/12/2024 CLINICAL DATA:  Amyloid EXAM: CARDIAC MRI TECHNIQUE: The patient was scanned on a 1.5 Tesla Siemens magnet. A dedicated cardiac coil was used. Functional imaging was done using Fiesta sequences. 2,3,  and 4 chamber views were done to assess for RWMA's. Modified Simpson's rule using a short axis stack was used to calculate an ejection fraction on a dedicated work [employer]. The patient received 10 cc of Gadavist . After 10 minutes inversion recovery sequences were used to assess for infiltration and scar tissue. CONTRAST:  Gadavist  FINDINGS: Normal atrial sizes. No ASD/PFO. Normal ascending thoracic aorta 3.4 cm. Normal tri leaflet AV. Mild MV thickening with trivial appearing MR. No pericardial effusion. Mild LVH septal thickness 12 mm. Normal LV size and function with no RWMA;s Normal RV size and function. Quantitative LVEF: 62% (EDV 113 ESV 43 SV 70 cc) Estimated cardiac output using flow analysis 4.4 L/min Quantitative RVEF: 53% (EDV 128 cc ESV 60 cc SV 68 cc ) Delayed gadolinium images show rather diffuse uptake in the sub epicardial septum and more diffuse in basal wall segments particularly laterally Parametric measures using Hct 45 T1 mildly elevated 1037 msec ECV elevated 55% T2 normal 50 msec IMPRESSION: 1. Findings consistent with amyloid with elevated T1,ECV and diffuse pattern of gadolinium uptake in the septum and basal wall segments. 2.  Mild LVH LVEF 62% no RWMA;s 3.  Normal RV size and function RVEF 53% 4.  Mild MV thickening trivial appearing MR 5.  Estimated cardiac output using flow analysis 4.4 L/min Maude Emmer Electronically Signed   By: Maude Emmer [name]   On: 06/12/2024 16:04   MR CARDIAC VELOCITY FLOW  MAP Result Date: 06/12/2024 CLINICAL DATA:  Amyloid EXAM: CARDIAC MRI TECHNIQUE: The patient was scanned on a 1.5 Tesla Siemens magnet. A dedicated cardiac coil was used. Functional imaging was done using Fiesta sequences. 2,3, and 4 chamber views were done to assess for RWMA's. Modified Simpson's rule using a short axis stack was used to calculate an ejection fraction on a dedicated work [employer], [employer]. The patient received 10 cc of Gadavist . After 10 minutes inversion recovery sequences were used to assess for infiltration and scar tissue. CONTRAST:  Gadavist  FINDINGS: Normal atrial sizes. No ASD/PFO. Normal ascending thoracic aorta 3.4 cm. Normal tri leaflet AV. Mild MV thickening with trivial appearing MR. No pericardial effusion. Mild LVH septal thickness 12 mm. Normal LV size and function with no RWMA;s Normal RV size and function. Quantitative LVEF: 62% (EDV 113 ESV 43 SV 70 cc) Estimated cardiac output using flow analysis 4.4 L/min Quantitative RVEF: 53% (EDV 128 cc ESV 60 cc SV 68 cc ) Delayed gadolinium images show rather diffuse uptake in the sub epicardial septum and more diffuse in basal wall segments particularly laterally Parametric measures using Hct 45 T1 mildly elevated 1037 msec ECV elevated 55% T2 normal 50 msec IMPRESSION: 1. Findings consistent with amyloid with elevated T1,ECV and diffuse pattern of gadolinium uptake in the septum and basal wall segments. 2.  Mild LVH LVEF 62% no RWMA;s 3.  Normal RV size and function RVEF 53% 4.  Mild MV thickening trivial appearing MR 5.  Estimated cardiac output using flow analysis 4.4 L/min Maude Emmer Electronically Signed   By: Maude Emmer [name]   On: 06/12/2024 16:04   MR CARDIAC VELOCITY FLOW MAP Result Date: 06/12/2024 CLINICAL DATA:  Amyloid EXAM: CARDIAC MRI TECHNIQUE: The patient was scanned on a 1.5 Tesla Siemens magnet. A dedicated cardiac coil was used. Functional imaging was done using Fiesta sequences. 2,3, and 4 chamber  views were done to assess for RWMA's. Modified Simpson's rule using a short axis stack was used to calculate an ejection  fraction on a dedicated work [employer] officer, [employer]. The patient received 10 cc of Gadavist . After 10 minutes inversion recovery sequences were used to assess for infiltration and scar tissue. CONTRAST:  Gadavist  FINDINGS: Normal atrial sizes. No ASD/PFO. Normal ascending thoracic aorta 3.4 cm. Normal tri leaflet AV. Mild MV thickening with trivial appearing MR. No pericardial effusion. Mild LVH septal thickness 12 mm. Normal LV size and function with no RWMA;s Normal RV size and function. Quantitative LVEF: 62% (EDV 113 ESV 43 SV 70 cc) Estimated cardiac output using flow analysis 4.4 L/min Quantitative RVEF: 53% (EDV 128 cc ESV 60 cc SV 68 cc ) Delayed gadolinium images show rather diffuse uptake in the sub epicardial septum and more diffuse in basal wall segments particularly laterally Parametric measures using Hct 45 T1 mildly elevated 1037 msec ECV elevated 55% T2 normal 50 msec IMPRESSION: 1. Findings consistent with amyloid with elevated T1,ECV and diffuse pattern of gadolinium uptake in the septum and basal wall segments. 2.  Mild LVH LVEF 62% no RWMA;s 3.  Normal RV size and function RVEF 53% 4.  Mild MV thickening trivial appearing MR 5.  Estimated cardiac output using flow analysis 4.4 L/min Maude Emmer Electronically Signed   By: Maude Emmer [name]   On: 06/12/2024 16:04    Assessment & Plan:  Cardiac amyloidosis (HCC) -     CBC with Differential/Platelet; Future  Primary hypertension -     Basic metabolic panel with GFR; Future -     Urinalysis, Routine w reflex microscopic; Future -     Basic metabolic panel with GFR; Future  Gastroesophageal reflux disease without esophagitis -     CBC with Differential/Platelet; Future  Dyslipidemia, goal LDL below 70 -     Hepatic function panel; Future -     TSH; Future -     CK; Future  Benign prostatic hyperplasia  with weak urinary stream -     PSA; Future -     Urinalysis, Routine w reflex microscopic; Future  Stage 3a chronic kidney disease (HCC)- Will avoid nephrotoxic agents  -     Basic metabolic panel with GFR; Future  Hyperkalemia- Will discontinue the ARB. -     Basic metabolic panel with GFR; Future     Follow-up: Return in about 6 months (around 01/16/2025).  Debby Molt, MD

## 2024-07-19 NOTE — Patient Instructions (Signed)
 Hypertension, Adult High blood pressure (hypertension) is when the force of blood pumping through the arteries is too strong. The arteries are the blood vessels that carry blood from the heart throughout the body. Hypertension forces the heart to work harder to pump blood and may cause arteries to become narrow or stiff. Untreated or uncontrolled hypertension can lead to a heart attack, heart failure, a stroke, kidney disease, and other problems. A blood pressure reading consists of a higher number over a lower number. Ideally, your blood pressure should be below 120/80. The first ("top") number is called the systolic pressure. It is a measure of the pressure in your arteries as your heart beats. The second ("bottom") number is called the diastolic pressure. It is a measure of the pressure in your arteries as the heart relaxes. What are the causes? The exact cause of this condition is not known. There are some conditions that result in high blood pressure. What increases the risk? Certain factors may make you more likely to develop high blood pressure. Some of these risk factors are under your control, including: Smoking. Not getting enough exercise or physical activity. Being overweight. Having too much fat, sugar, calories, or salt (sodium) in your diet. Drinking too much alcohol. Other risk factors include: Having a personal history of heart disease, diabetes, high cholesterol, or kidney disease. Stress. Having a family history of high blood pressure and high cholesterol. Having obstructive sleep apnea. Age. The risk increases with age. What are the signs or symptoms? High blood pressure may not cause symptoms. Very high blood pressure (hypertensive crisis) may cause: Headache. Fast or irregular heartbeats (palpitations). Shortness of breath. Nosebleed. Nausea and vomiting. Vision changes. Severe chest pain, dizziness, and seizures. How is this diagnosed? This condition is diagnosed by  measuring your blood pressure while you are seated, with your arm resting on a flat surface, your legs uncrossed, and your feet flat on the floor. The cuff of the blood pressure monitor will be placed directly against the skin of your upper arm at the level of your heart. Blood pressure should be measured at least twice using the same arm. Certain conditions can cause a difference in blood pressure between your right and left arms. If you have a high blood pressure reading during one visit or you have normal blood pressure with other risk factors, you may be asked to: Return on a different day to have your blood pressure checked again. Monitor your blood pressure at home for 1 week or longer. If you are diagnosed with hypertension, you may have other blood or imaging tests to help your health care provider understand your overall risk for other conditions. How is this treated? This condition is treated by making healthy lifestyle changes, such as eating healthy foods, exercising more, and reducing your alcohol intake. You may be referred for counseling on a healthy diet and physical activity. Your health care provider may prescribe medicine if lifestyle changes are not enough to get your blood pressure under control and if: Your systolic blood pressure is above 130. Your diastolic blood pressure is above 80. Your personal target blood pressure may vary depending on your medical conditions, your age, and other factors. Follow these instructions at home: Eating and drinking  Eat a diet that is high in fiber and potassium, and low in sodium, added sugar, and fat. An example of this eating plan is called the DASH diet. DASH stands for Dietary Approaches to Stop Hypertension. To eat this way: Eat  plenty of fresh fruits and vegetables. Try to fill one half of your plate at each meal with fruits and vegetables. Eat whole grains, such as whole-wheat pasta, brown rice, or whole-grain bread. Fill about one  fourth of your plate with whole grains. Eat or drink low-fat dairy products, such as skim milk or low-fat yogurt. Avoid fatty cuts of meat, processed or cured meats, and poultry with skin. Fill about one fourth of your plate with lean proteins, such as fish, chicken without skin, beans, eggs, or tofu. Avoid pre-made and processed foods. These tend to be higher in sodium, added sugar, and fat. Reduce your daily sodium intake. Many people with hypertension should eat less than 1,500 mg of sodium a day. Do not drink alcohol if: Your health care provider tells you not to drink. You are pregnant, may be pregnant, or are planning to become pregnant. If you drink alcohol: Limit how much you have to: 0-1 drink a day for women. 0-2 drinks a day for men. Know how much alcohol is in your drink. In the [place] one drink equals one 12 oz bottle of beer (355 mL), one 5 oz glass of wine (148 mL), or one 1 oz glass of hard liquor (44 mL). Lifestyle  Work with your health care provider to maintain a healthy body weight or to lose weight. Ask what an ideal weight is for you. Get at least 30 minutes of exercise that causes your heart to beat faster (aerobic exercise) most days of the week. Activities may include walking, swimming, or biking. Include exercise to strengthen your muscles (resistance exercise), such as Pilates or lifting weights, as part of your weekly exercise routine. Try to do these types of exercises for 30 minutes at least 3 days a week. Do not use any products that contain nicotine or tobacco. These products include cigarettes, chewing tobacco, and vaping devices, such as e-cigarettes. If you need help quitting, ask your health care provider. Monitor your blood pressure at home as told by your health care provider. Keep all follow-up visits. This is important. Medicines Take over-the-counter and prescription medicines only as told by your health care provider. Follow directions carefully. Blood  pressure medicines must be taken as prescribed. Do not skip doses of blood pressure medicine. Doing this puts you at risk for problems and can make the medicine less effective. Ask your health care provider about side effects or reactions to medicines that you should watch for. Contact a health care provider if you: Think you are having a reaction to a medicine you are taking. Have headaches that keep coming back (recurring). Feel dizzy. Have swelling in your ankles. Have trouble with your vision. Get help right away if you: Develop a severe headache or confusion. Have unusual weakness or numbness. Feel faint. Have severe pain in your chest or abdomen. Vomit repeatedly. Have trouble breathing. These symptoms may be an emergency. Get help right away. Call 911. Do not wait to see if the symptoms will go away. Do not drive yourself to the hospital. Summary Hypertension is when the force of blood pumping through your arteries is too strong. If this condition is not controlled, it may put you at risk for serious complications. Your personal target blood pressure may vary depending on your medical conditions, your age, and other factors. For most people, a normal blood pressure is less than 120/80. Hypertension is treated with lifestyle changes, medicines, or a combination of both. Lifestyle changes include losing weight, eating a healthy,  low-sodium diet, exercising more, and limiting alcohol. This information is not intended to replace advice given to you by your health care provider. Make sure you discuss any questions you have with your health care provider. Document Revised: 08/19/2021 Document Reviewed: 08/19/2021 Elsevier Patient Education  2024 [employer].

## 2024-07-20 DIAGNOSIS — E875 Hyperkalemia: Secondary | ICD-10-CM | POA: Insufficient documentation

## 2024-07-21 ENCOUNTER — Other Ambulatory Visit: Payer: Self-pay | Admitting: Internal Medicine

## 2024-07-21 DIAGNOSIS — N401 Enlarged prostate with lower urinary tract symptoms: Secondary | ICD-10-CM

## 2024-07-25 DIAGNOSIS — M47812 Spondylosis without myelopathy or radiculopathy, cervical region: Secondary | ICD-10-CM | POA: Diagnosis not present

## 2024-07-25 DIAGNOSIS — M6281 Muscle weakness (generalized): Secondary | ICD-10-CM | POA: Diagnosis not present

## 2024-07-25 DIAGNOSIS — R269 Unspecified abnormalities of gait and mobility: Secondary | ICD-10-CM | POA: Diagnosis not present

## 2024-07-25 DIAGNOSIS — M542 Cervicalgia: Secondary | ICD-10-CM | POA: Diagnosis not present

## 2024-07-26 DIAGNOSIS — M1120 Other chondrocalcinosis, unspecified site: Secondary | ICD-10-CM | POA: Diagnosis not present

## 2024-07-26 DIAGNOSIS — M65332 Trigger finger, left middle finger: Secondary | ICD-10-CM | POA: Diagnosis not present

## 2024-07-26 DIAGNOSIS — E8582 Wild-type transthyretin-related (ATTR) amyloidosis: Secondary | ICD-10-CM | POA: Diagnosis not present

## 2024-07-26 DIAGNOSIS — Z4789 Encounter for other orthopedic aftercare: Secondary | ICD-10-CM | POA: Diagnosis not present

## 2024-08-01 ENCOUNTER — Other Ambulatory Visit: Payer: Self-pay

## 2024-08-02 ENCOUNTER — Encounter: Payer: Self-pay | Admitting: Internal Medicine

## 2024-08-02 DIAGNOSIS — L111 Transient acantholytic dermatosis [Grover]: Secondary | ICD-10-CM | POA: Diagnosis not present

## 2024-08-02 DIAGNOSIS — L814 Other melanin hyperpigmentation: Secondary | ICD-10-CM | POA: Diagnosis not present

## 2024-08-02 DIAGNOSIS — L738 Other specified follicular disorders: Secondary | ICD-10-CM | POA: Diagnosis not present

## 2024-08-02 DIAGNOSIS — L720 Epidermal cyst: Secondary | ICD-10-CM | POA: Diagnosis not present

## 2024-08-02 DIAGNOSIS — Z85820 Personal history of malignant melanoma of skin: Secondary | ICD-10-CM | POA: Diagnosis not present

## 2024-08-02 DIAGNOSIS — Z85828 Personal history of other malignant neoplasm of skin: Secondary | ICD-10-CM | POA: Diagnosis not present

## 2024-08-02 DIAGNOSIS — L820 Inflamed seborrheic keratosis: Secondary | ICD-10-CM | POA: Diagnosis not present

## 2024-08-02 DIAGNOSIS — L821 Other seborrheic keratosis: Secondary | ICD-10-CM | POA: Diagnosis not present

## 2024-08-03 ENCOUNTER — Other Ambulatory Visit (INDEPENDENT_AMBULATORY_CARE_PROVIDER_SITE_OTHER)

## 2024-08-03 DIAGNOSIS — N1831 Chronic kidney disease, stage 3a: Secondary | ICD-10-CM | POA: Diagnosis not present

## 2024-08-03 DIAGNOSIS — E875 Hyperkalemia: Secondary | ICD-10-CM

## 2024-08-03 DIAGNOSIS — I10 Essential (primary) hypertension: Secondary | ICD-10-CM | POA: Diagnosis not present

## 2024-08-03 LAB — BASIC METABOLIC PANEL WITH GFR
BUN: 20 mg/dL (ref 6–23)
CO2: 30 meq/L (ref 19–32)
Calcium: 9.7 mg/dL (ref 8.4–10.5)
Chloride: 99 meq/L (ref 96–112)
Creatinine, Ser: 1.12 mg/dL (ref 0.40–1.50)
GFR: 63.83 mL/min (ref >60.00)
Glucose, Bld: 92 mg/dL (ref 70–99)
Potassium: 3.9 meq/L (ref 3.5–5.1)
Sodium: 137 meq/L (ref 135–145)

## 2024-08-04 ENCOUNTER — Other Ambulatory Visit (HOSPITAL_COMMUNITY): Payer: Self-pay | Admitting: Cardiology

## 2024-08-04 ENCOUNTER — Telehealth (HOSPITAL_COMMUNITY): Payer: Self-pay | Admitting: *Deleted

## 2024-08-04 ENCOUNTER — Ambulatory Visit (HOSPITAL_COMMUNITY)
Admission: RE | Admit: 2024-08-04 | Discharge: 2024-08-04 | Disposition: A | Discharge status: Home / Self Care | Source: Ambulatory Visit | Attending: Cardiology | Admitting: Cardiology

## 2024-08-04 ENCOUNTER — Encounter (HOSPITAL_COMMUNITY): Payer: Self-pay | Admitting: Cardiology

## 2024-08-04 VITALS — BP 118/70 | HR 81 | Wt 175.6 lb

## 2024-08-04 DIAGNOSIS — I43 Cardiomyopathy in diseases classified elsewhere: Secondary | ICD-10-CM | POA: Diagnosis not present

## 2024-08-04 DIAGNOSIS — M1990 Unspecified osteoarthritis, unspecified site: Secondary | ICD-10-CM | POA: Insufficient documentation

## 2024-08-04 DIAGNOSIS — I2510 Atherosclerotic heart disease of native coronary artery without angina pectoris: Secondary | ICD-10-CM | POA: Diagnosis not present

## 2024-08-04 DIAGNOSIS — I493 Ventricular premature depolarization: Secondary | ICD-10-CM | POA: Diagnosis not present

## 2024-08-04 DIAGNOSIS — G629 Polyneuropathy, unspecified: Secondary | ICD-10-CM | POA: Diagnosis not present

## 2024-08-04 DIAGNOSIS — S46219A Strain of muscle, fascia and tendon of other parts of biceps, unspecified arm, initial encounter: Secondary | ICD-10-CM | POA: Diagnosis not present

## 2024-08-04 DIAGNOSIS — E854 Organ-limited amyloidosis: Secondary | ICD-10-CM | POA: Diagnosis not present

## 2024-08-04 DIAGNOSIS — I10 Essential (primary) hypertension: Secondary | ICD-10-CM | POA: Insufficient documentation

## 2024-08-04 DIAGNOSIS — M4800 Spinal stenosis, site unspecified: Secondary | ICD-10-CM | POA: Diagnosis not present

## 2024-08-04 DIAGNOSIS — R002 Palpitations: Secondary | ICD-10-CM | POA: Insufficient documentation

## 2024-08-04 NOTE — Telephone Encounter (Signed)
 Reminder call given for upcoming Amyloid study on 08/08/24

## 2024-08-04 NOTE — Patient Instructions (Addendum)
 STAY OFF Acoramidis  for now   You have been ordered a PYP Scan.  This is done at [facility] at [address], [place], [place], [address].  When you come for this test please plan to be there 2-3 hours.  Your physician recommends that you schedule a follow-up appointment in: 6 weeks.  If you have any questions or concerns before your next appointment please send us  a message through [place] or call our office at [phone].    TO LEAVE A MESSAGE FOR THE NURSE SELECT OPTION 2, PLEASE LEAVE A MESSAGE INCLUDING: YOUR NAME DATE OF BIRTH CALL BACK NUMBER REASON FOR CALL**this is important as we prioritize the call backs  YOU WILL RECEIVE A CALL BACK THE SAME DAY AS LONG AS YOU CALL BEFORE 4:00 PM  At the [facility], you and your health needs are our priority. As part of our continuing mission to provide you with exceptional heart care, we have created designated Provider Care Teams. These Care Teams include your primary Cardiologist (physician) and Advanced Practice Providers (APPs- Physician Assistants and Nurse Practitioners) who all work together to provide you with the care you need, when you need it.   You may see any of the following providers on your designated Care Team at your next follow up: Dr Toribio Fuel Dr Ezra Shuck Dr. Ria Commander Dr. Morene Brownie Amy Lenetta, NP Caffie Shed, [place] [facility] [place], [place] Beckey Coe, NP [place] Lee, NP Ellouise Class, NP Tinnie Redman, PharmD Jaun Bash, PharmD   Please be sure to bring in all your medications bottles to every appointment.    Thank you for choosing [facility]

## 2024-08-06 NOTE — Progress Notes (Signed)
 PCP: Joshua Debby CROME, MD Cardiology: Dr. Delford HF Cardiology: Dr. Rolan  Chief complaint: Cardiac amyloidosis  78 y.o. with history of HTN, calcium  pyrophosphate deposition disease arthritis, spinal stenosis, carpal tunnel syndrome, biceps tendon rupture, and peripheral neuropathy was referred by Dr. Delford for evaluation of cardiac amyloidosis. Patient had cath in 2022 with nonobstructive LAD disease, otherwise no prior significant cardiac history.  However, he has had a rotator cuff tear, bilateral carpal tunnel repairs in 2012, biceps tendon rupture in 2022, c-spine and L-spine stenosis with prior surgeries. His biceps tendon rupture had minimal provocation, was picking up a light suitcase.   He has peripheral neuropathy of uncertain etiology extensively worked up at [facility] in the past (no diabetes, B12 normal, etc).  He had an echo done in 5/25 which showed EF 65-70% with borderline LVH, normal RV, PASP 37 mmHg, normal strain pattern.  Though the echo was not particularly abnormal, given the constellation of signs and symptoms described above, he had a PYP scan done in 7/25 which was markedly positive, grade 3, suggestive of transthyretin cardiac amyloidosis. SPEP/UPEP negative.   We reviewed the PYP scan with our imaging section, and after consideration there was concern about the technique of the study.  It may not be truly positive.   TTR gene testing was negative for TTR gene mutations.   Patient subsequently had a cardiac MRI done that I reviewed today.  This showed LV EF 62% with mild LVH, RV EF 53%, subtle if any LGE, and ECV recalculated to 32%.  I do not think that this looks particularly like cardiac amyloidosis.    Patient returns for followup of ?cardiac amyloidosis today. He had started acoramidis , but we had him stop it a few days ago due to concern that he may not truly have amyloidosis.  Main complaint today remains poor balance due to peripheral neuropathy. He has occasional  palpitations with history of PVCs.  He does not have exertional dyspnea or chest pain.  No orthopnea/PND.  No lightheadedness.   ECG (personally reviewed): NSR, PVCs, nonspecific T wave changes.   Labs (1/25): Pro-BNP 27 Labs (4/25): K 4.6, creatinine 1.22 Labs (6/25): SPEP/UPEP negative Labs (8/25): LDL 64, BNP 49.5, TTR gene testing negative Labs (10/25): K 3.9, creatinine 1.12  PMH: 1. HTN 2. SVT: ?Atrial tachycardia, noted on prior monitors.  3. Rotator cuff tear 4. Biceps tendon rupture 2022 5. Bilateral carpal tunnel syndrome surgery 2012 6. Peripheral neuropathy: Of uncertain etiology.  Workup at [facility], had nerve conduction studies/EMG.  7. 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  8. Calcium  pyrophosphate deposition disease arthritis 9. H/o melanoma 10. CAD: LHC (2022) with 50% mid LCx stenosis.  11. ?Cardiac amyloidosis:  - Echo (5/25): EF 65-70% with borderline LVH, normal RV, PASP 37 mmHg, normal strain pattern.  - PYP scan (7/25): Grade 3, strongly suggestive of TTR cardiac amyloidosis => however, study re-interpreted to be not definitely positive.  - Cardiac MRI (8/25): LV EF 62%, mild LVH, RV EF 53%, possible LGE in basal septum but very subtle by my interpretation, LGE re-calculated to be around 32%. Not highly suggestive of cardiac amyloidosis.   FH: No cardiac disease.  Mother and sister with Alzheimer's-type dementia, brother with esophageal cancer.   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   ROS: All systems reviewed and negative except as per HPI.   Current Outpatient Medications  Medication Sig Dispense Refill   alfuzosin  (UROXATRAL ) 10 MG 24 hr tablet TAKE 1 TABLET  BY MOUTH EVERY EVENING 90 tablet 0   ALPRAZolam  (XANAX ) 0.5 MG tablet Take 1 tablet (0.5 mg total) by mouth 2 (two) times daily as needed for anxiety. 180 tablet 0   aspirin  EC 81 MG tablet Take 81 mg by mouth every 3 (three) days. Swallow whole.     celecoxib   (CELEBREX ) 50 MG capsule TAKE 1 CAPSULE BY MOUTH 2 TIMES DAILY. 180 capsule 0   desoximetasone (TOPICORT) 0.25 % cream Apply 1 Application topically every evening.     Multiple Vitamin (MULTIVITAMIN WITH MINERALS) TABS tablet Take 1 tablet by mouth daily.     pantoprazole  (PROTONIX ) 40 MG tablet TAKE 1 TABLET BY MOUTH EVERY DAY 30 MINUTES BEFORE EVENING MEALS 90 tablet 1   rosuvastatin  (CRESTOR ) 10 MG tablet TAKE 1 TABLET BY MOUTH EVERYDAY AT BEDTIME 90 tablet 1   traMADol  (ULTRAM ) 50 MG tablet Take 50 mg by mouth 2 (two) times daily.     Acoramidis , 712MG  Twice Daily, (ATTRUBY ) 356 MG TBPK Take 712 mg by mouth 2 (two) times daily. (Patient not taking: Reported on 08/04/2024) 112 each 11   No current facility-administered medications for this encounter.   BP 118/70   Pulse 81   Wt 79.7 kg (175 lb 9.6 oz)   SpO2 96%   BMI 25.93 kg/m  General: NAD Neck: No JVD, no thyromegaly or thyroid  nodule.  Lungs: Clear to auscultation bilaterally with normal respiratory effort. CV: Nondisplaced PMI.  Heart regular S1/S2, no S3/S4, no murmur.  No peripheral edema.  No carotid bruit.  Normal pedal pulses.  Abdomen: Soft, nontender, no hepatosplenomegaly, no distention.  Skin: Intact without lesions or rashes.  Neurologic: Alert and oriented x 3.  Psych: Normal affect. Extremities: No clubbing or cyanosis.  HEENT: Normal.   Assessment/Plan: 1. Workup for TTR cardiac amyloidosis: Patient has a constellation of symptoms concerning for cardiac amyloidosis; spinal stenosis, bilateral carpal tunnel syndrome, biceps tendon rupture with minimal provocation, peripheral neuropathy of uncertain etiology. However, he also has calcium  pyrophosphate deposition disease arthritis which could explain the carpal tunnel syndrome and tendon rupture. SPEP and UPEP negative.  Echo showed borderline LVH, but I did not find the echo strongly suggestive of cardiac amyloidosis.  However, the PYP scan was markedly positive, grade  3.  The PYP scan, however, was reviewed by our imaging section and after consideration there was concern about the acquisition of the study; it may not have been truly positive.  A subsequent MRI in 8/25 was also not highly suggestive of cardiac amyloidosis.  Finally, genetic testing was negative for transthyretin gene mutations.  Patient has NYHA class I symptoms, generally limited only by arthritis and balance difficulty from his peripheral neuropathy.  - He will stay off of amyloid-specific treatment for now.  - I am going to order a repeat PYP scan.  If this is not positive, his symptoms are likely explainable by calcium  pyrophosphate deposition disease + an idiopathic peripheral neuropathy.  2. Peripheral neuropathy: Extensive workup and [facility], no etiology determined.  Not a diabetic, B12 normal, myeloma workup has been negative. Had EMG and nerve conduction studies.   3. PVCs: Noted on ECG, occasionally feels palpitations.  - Follow for now.   4. CAD: 50% mid LAD stenosis on cath in 2022.   - Continue ASA 81 - Continue statin.   Followup with me in 6 weeks.  I spent 31 minutes reviewing records, interviewing/examining patient, and managing orders.   Ezra Shuck 08/06/2024

## 2024-08-07 ENCOUNTER — Telehealth (HOSPITAL_COMMUNITY): Payer: Self-pay

## 2024-08-07 NOTE — Telephone Encounter (Signed)
 Detailed instructions left on the patient's answering machine. Asked to call back with any questions.  S.Alessa Mazur CCT

## 2024-08-08 ENCOUNTER — Ambulatory Visit (HOSPITAL_COMMUNITY)
Admission: RE | Admit: 2024-08-08 | Discharge: 2024-08-08 | Disposition: A | Discharge status: Home / Self Care | Source: Ambulatory Visit | Attending: Cardiology | Admitting: Cardiology

## 2024-08-08 ENCOUNTER — Encounter (HOSPITAL_COMMUNITY): Payer: Self-pay | Admitting: Cardiology

## 2024-08-08 DIAGNOSIS — M47814 Spondylosis without myelopathy or radiculopathy, thoracic region: Secondary | ICD-10-CM | POA: Diagnosis not present

## 2024-08-08 DIAGNOSIS — I700 Atherosclerosis of aorta: Secondary | ICD-10-CM | POA: Diagnosis not present

## 2024-08-08 DIAGNOSIS — I43 Cardiomyopathy in diseases classified elsewhere: Secondary | ICD-10-CM | POA: Diagnosis present

## 2024-08-08 DIAGNOSIS — I2510 Atherosclerotic heart disease of native coronary artery without angina pectoris: Secondary | ICD-10-CM | POA: Diagnosis not present

## 2024-08-08 DIAGNOSIS — E854 Organ-limited amyloidosis: Secondary | ICD-10-CM

## 2024-08-08 LAB — MYOCARDIAL AMYLOID PLANAR & SPECT: H/CL Ratio: 1.23

## 2024-08-08 MED ORDER — TECHNETIUM TC 99M PYROPHOSPHATE
21.3000 | Freq: Once | INTRAVENOUS | Status: AC
  Administered 2024-08-08: 21.3 via INTRAVENOUS

## 2024-08-09 ENCOUNTER — Ambulatory Visit (HOSPITAL_COMMUNITY): Payer: Self-pay | Admitting: Cardiology

## 2024-08-09 ENCOUNTER — Other Ambulatory Visit: Payer: Self-pay | Admitting: Internal Medicine

## 2024-08-09 DIAGNOSIS — K219 Gastro-esophageal reflux disease without esophagitis: Secondary | ICD-10-CM

## 2024-08-09 NOTE — Telephone Encounter (Signed)
 Pt aware and voiced understanding

## 2024-08-10 ENCOUNTER — Encounter (HOSPITAL_COMMUNITY): Payer: Self-pay | Admitting: Cardiology

## 2024-08-11 ENCOUNTER — Other Ambulatory Visit: Payer: Self-pay

## 2024-08-11 ENCOUNTER — Other Ambulatory Visit (HOSPITAL_COMMUNITY): Payer: Self-pay | Admitting: *Deleted

## 2024-08-11 DIAGNOSIS — Z23 Encounter for immunization: Secondary | ICD-10-CM | POA: Diagnosis not present

## 2024-08-11 DIAGNOSIS — I517 Cardiomegaly: Secondary | ICD-10-CM

## 2024-08-11 NOTE — Progress Notes (Signed)
 Per Dr Rolan: Can you put in an order to IR (attn Dr. Marcey Moan) for Dr Wadie to get an abdominal fat pad biopsy with STAINING FOR AMYLOIDOSIS    Order placed, will check on ins auth and get scheduled

## 2024-08-15 ENCOUNTER — Encounter (HOSPITAL_COMMUNITY): Payer: Self-pay

## 2024-08-15 DIAGNOSIS — M47812 Spondylosis without myelopathy or radiculopathy, cervical region: Secondary | ICD-10-CM | POA: Diagnosis not present

## 2024-08-15 DIAGNOSIS — R269 Unspecified abnormalities of gait and mobility: Secondary | ICD-10-CM | POA: Diagnosis not present

## 2024-08-15 DIAGNOSIS — M542 Cervicalgia: Secondary | ICD-10-CM | POA: Diagnosis not present

## 2024-08-15 DIAGNOSIS — M6281 Muscle weakness (generalized): Secondary | ICD-10-CM | POA: Diagnosis not present

## 2024-08-15 NOTE — Progress Notes (Signed)
 Review for Fat Pad bx - ok per Dr. Marcey Moan :   Fat pad biopsy in US  under local; can be w/ anyone, but I'd like to do, if possible. Has to be in next week and a half or so because he's going out of town

## 2024-08-16 ENCOUNTER — Ambulatory Visit (HOSPITAL_COMMUNITY)
Admission: RE | Admit: 2024-08-16 | Discharge: 2024-08-16 | Disposition: A | Discharge status: Home / Self Care | Source: Ambulatory Visit | Attending: Cardiology | Admitting: Cardiology

## 2024-08-16 DIAGNOSIS — E859 Amyloidosis, unspecified: Secondary | ICD-10-CM | POA: Insufficient documentation

## 2024-08-16 DIAGNOSIS — E65 Localized adiposity: Secondary | ICD-10-CM | POA: Diagnosis not present

## 2024-08-16 DIAGNOSIS — R222 Localized swelling, mass and lump, trunk: Secondary | ICD-10-CM | POA: Diagnosis not present

## 2024-08-16 DIAGNOSIS — I517 Cardiomegaly: Secondary | ICD-10-CM

## 2024-08-16 MED ORDER — LIDOCAINE HCL (PF) 1 % IJ SOLN
10.0000 mL | Freq: Once | INTRAMUSCULAR | Status: AC
  Administered 2024-08-16: 10 mL via INTRADERMAL

## 2024-08-16 NOTE — Procedures (Signed)
 Interventional Radiology Procedure Note  Procedure: US  RLQ abdominal wall SQ fat pad bx    Complications: None  Estimated Blood Loss:  min  Findings: 11g aspiration core bx of SQ FAT  in formalin    EMERSON FREDERIC SPECKING, MD

## 2024-08-22 ENCOUNTER — Encounter (HOSPITAL_COMMUNITY): Payer: Self-pay | Admitting: Cardiology

## 2024-08-23 ENCOUNTER — Ambulatory Visit (HOSPITAL_COMMUNITY): Payer: Self-pay | Admitting: Cardiology

## 2024-08-23 ENCOUNTER — Encounter (HOSPITAL_COMMUNITY): Payer: Self-pay | Admitting: Cardiology

## 2024-08-23 LAB — SURGICAL PATHOLOGY

## 2024-08-25 ENCOUNTER — Other Ambulatory Visit (HOSPITAL_COMMUNITY): Payer: Self-pay

## 2024-08-25 NOTE — Telephone Encounter (Signed)
 Would you like him to be seen sooner than his currently scheduled appt 12/2

## 2024-08-28 ENCOUNTER — Telehealth: Payer: Self-pay | Admitting: Internal Medicine

## 2024-08-28 ENCOUNTER — Other Ambulatory Visit: Payer: Self-pay | Admitting: Internal Medicine

## 2024-08-28 ENCOUNTER — Other Ambulatory Visit (HOSPITAL_COMMUNITY): Payer: Self-pay

## 2024-08-28 DIAGNOSIS — M150 Primary generalized (osteo)arthritis: Secondary | ICD-10-CM

## 2024-08-28 DIAGNOSIS — M1712 Unilateral primary osteoarthritis, left knee: Secondary | ICD-10-CM

## 2024-08-28 NOTE — Telephone Encounter (Unsigned)
 Copied from CRM [id number]. Topic: Clinical - Prescription Issue >> Aug 28, 2024  1:07 PM Nessti S wrote: Reason for CRM: pt called because he is going out of town tomorrow and needs traMADol  (ULTRAM ) 50 MG tablet refilled today. Call back number [phone] >> Aug 28, 2024  3:31 PM Rea ORN wrote: Pt called to follow up on this rx request. Pt stated he spoke with a young man at the front desk that advised him he would get a call by 3 pm. Pt stated he was concerned that rx would not be sent in time. I advised that the rx is pending to be sent by clinic. Pt is requesting a call back once sent. He apologized for the late notice. He didn't realize he would be out of town when the rx runs out. Please call [phone] to advise once sent.

## 2024-08-28 NOTE — Telephone Encounter (Unsigned)
 Copied from CRM [id number]. Topic: Clinical - Prescription Issue >> Aug 28, 2024  1:07 PM Nessti S wrote: Reason for CRM: pt called because he is going out of town tomorrow and needs traMADol  (ULTRAM ) 50 MG tablet refilled today. Call back number [phone]

## 2024-08-28 NOTE — Telephone Encounter (Unsigned)
 Copied from CRM [id number]. Topic: Clinical - Medication Refill >> Aug 28, 2024  8:14 AM Macario HERO wrote: Medication: traMADol  (ULTRAM ) 50 MG tablet [[id number]]   Has the patient contacted their pharmacy? No (Agent: If no, request that the patient contact the pharmacy for the refill. If patient does not wish to contact the pharmacy document the reason why and proceed with request.) (Agent: If yes, when and what did the pharmacy advise?)  This is the patient's preferred pharmacy:  [id number] - [place], [place] - [address] AT [address] OF [facility] [address] [place] [address] Phone: [phone] Fax: [phone]   Is this the correct pharmacy for this prescription? Yes If no, delete pharmacy and type the correct one.   Has the prescription been filled recently? Yes  Is the patient out of the medication? Yes, patient is traveling tomorrow  Has the patient been seen for an appointment in the last year OR does the patient have an upcoming appointment? Yes  Can we respond through MyChart? No  Agent: Please be advised that Rx refills may take up to 3 business days. We ask that you follow-up with your pharmacy.

## 2024-08-29 ENCOUNTER — Other Ambulatory Visit: Payer: Self-pay

## 2024-08-29 NOTE — Telephone Encounter (Signed)
 Rx request sent to Dr. Joshua as this is controlled substance.

## 2024-08-29 NOTE — Progress Notes (Signed)
 Patient no longer being treated with Attrub[facility] , Nason confirmed may disenroll.

## 2024-09-02 ENCOUNTER — Other Ambulatory Visit: Payer: Self-pay | Admitting: Internal Medicine

## 2024-09-02 DIAGNOSIS — I10 Essential (primary) hypertension: Secondary | ICD-10-CM

## 2024-09-03 ENCOUNTER — Other Ambulatory Visit: Payer: Self-pay | Admitting: Internal Medicine

## 2024-09-03 DIAGNOSIS — M1711 Unilateral primary osteoarthritis, right knee: Secondary | ICD-10-CM

## 2024-09-03 DIAGNOSIS — M1189 Other specified crystal arthropathies, multiple sites: Secondary | ICD-10-CM

## 2024-09-03 DIAGNOSIS — M150 Primary generalized (osteo)arthritis: Secondary | ICD-10-CM

## 2024-09-03 DIAGNOSIS — M170 Bilateral primary osteoarthritis of knee: Secondary | ICD-10-CM

## 2024-09-03 DIAGNOSIS — M479 Spondylosis, unspecified: Secondary | ICD-10-CM

## 2024-09-03 DIAGNOSIS — M4716 Other spondylosis with myelopathy, lumbar region: Secondary | ICD-10-CM

## 2024-09-03 DIAGNOSIS — M1712 Unilateral primary osteoarthritis, left knee: Secondary | ICD-10-CM

## 2024-09-03 DIAGNOSIS — I10 Essential (primary) hypertension: Secondary | ICD-10-CM

## 2024-09-04 ENCOUNTER — Telehealth (HOSPITAL_COMMUNITY): Payer: Self-pay | Admitting: Cardiology

## 2024-09-04 NOTE — Telephone Encounter (Signed)
 Called to confirm/remind patient of their appointment [facility] on 09/04/[facility].   Appointment:   [] Confirmed  [] Left mess   [x] No answer/No voice mail  [] VM Full/unable to leave message  [] Phone not in service  Patient reminded to bring all medications and/or complete list.  Confirmed patient has transportation. Gave directions, instructed to utilize valet parking.

## 2024-09-04 NOTE — Telephone Encounter (Signed)
Patient returned call to confirm appt.  

## 2024-09-05 ENCOUNTER — Encounter (HOSPITAL_COMMUNITY): Payer: Self-pay | Admitting: Cardiology

## 2024-09-05 ENCOUNTER — Ambulatory Visit (HOSPITAL_COMMUNITY)
Admission: RE | Admit: 2024-09-05 | Discharge: 2024-09-05 | Disposition: A | Discharge status: Home / Self Care | Source: Ambulatory Visit | Attending: Cardiology | Admitting: Cardiology

## 2024-09-05 VITALS — BP 130/76 | HR 103 | Ht 69.0 in | Wt 178.8 lb

## 2024-09-05 DIAGNOSIS — R202 Paresthesia of skin: Secondary | ICD-10-CM | POA: Insufficient documentation

## 2024-09-05 DIAGNOSIS — E854 Organ-limited amyloidosis: Secondary | ICD-10-CM | POA: Insufficient documentation

## 2024-09-05 DIAGNOSIS — I493 Ventricular premature depolarization: Secondary | ICD-10-CM | POA: Diagnosis not present

## 2024-09-05 DIAGNOSIS — Z79899 Other long term (current) drug therapy: Secondary | ICD-10-CM | POA: Diagnosis not present

## 2024-09-05 DIAGNOSIS — M4800 Spinal stenosis, site unspecified: Secondary | ICD-10-CM | POA: Insufficient documentation

## 2024-09-05 DIAGNOSIS — R0602 Shortness of breath: Secondary | ICD-10-CM | POA: Insufficient documentation

## 2024-09-05 DIAGNOSIS — M1990 Unspecified osteoarthritis, unspecified site: Secondary | ICD-10-CM | POA: Diagnosis not present

## 2024-09-05 DIAGNOSIS — I491 Atrial premature depolarization: Secondary | ICD-10-CM | POA: Insufficient documentation

## 2024-09-05 DIAGNOSIS — G629 Polyneuropathy, unspecified: Secondary | ICD-10-CM | POA: Diagnosis not present

## 2024-09-05 DIAGNOSIS — I5042 Chronic combined systolic (congestive) and diastolic (congestive) heart failure: Secondary | ICD-10-CM | POA: Diagnosis not present

## 2024-09-05 DIAGNOSIS — S46219A Strain of muscle, fascia and tendon of other parts of biceps, unspecified arm, initial encounter: Secondary | ICD-10-CM | POA: Insufficient documentation

## 2024-09-05 DIAGNOSIS — I10 Essential (primary) hypertension: Secondary | ICD-10-CM | POA: Insufficient documentation

## 2024-09-05 DIAGNOSIS — I2510 Atherosclerotic heart disease of native coronary artery without angina pectoris: Secondary | ICD-10-CM | POA: Insufficient documentation

## 2024-09-05 DIAGNOSIS — Z791 Long term (current) use of non-steroidal anti-inflammatories (NSAID): Secondary | ICD-10-CM | POA: Insufficient documentation

## 2024-09-05 DIAGNOSIS — I43 Cardiomyopathy in diseases classified elsewhere: Secondary | ICD-10-CM | POA: Diagnosis not present

## 2024-09-05 DIAGNOSIS — G5600 Carpal tunnel syndrome, unspecified upper limb: Secondary | ICD-10-CM | POA: Insufficient documentation

## 2024-09-05 NOTE — Progress Notes (Signed)
 PCP: Joshua Debby CROME, MD Cardiology: Dr. Delford HF Cardiology: Dr. Rolan  Chief complaint: Peripheral neuropathy  78 y.o. with history of HTN, calcium  pyrophosphate deposition disease arthritis, spinal stenosis, carpal tunnel syndrome, biceps tendon rupture, and peripheral neuropathy was referred by Dr. Delford for evaluation of cardiac amyloidosis. Patient had cath in 2022 with nonobstructive LAD disease, otherwise no prior significant cardiac history.  However, he has had a rotator cuff tear, bilateral carpal tunnel repairs in 2012, biceps tendon rupture in 2022, c-spine and L-spine stenosis with prior surgeries. His biceps tendon rupture had minimal provocation, was picking up a light suitcase.   He has peripheral neuropathy of uncertain etiology extensively worked up at [facility] in the past (no diabetes, B12 normal, etc).  He had an echo done in 5/25 which showed EF 65-70% with borderline LVH, normal RV, PASP 37 mmHg, normal strain pattern.  Though the echo was not particularly abnormal, given the constellation of signs and symptoms described above, he had a PYP scan done in 7/25 which was markedly positive, grade 3, suggestive of transthyretin cardiac amyloidosis. SPEP/UPEP negative.   We reviewed the PYP scan with our imaging section; it was thought to be negative with all uptake in the blood pool.   Transthyretin gene testing was negative for TTR gene mutations.   Patient subsequently had a cardiac MRI done that I reviewed.  This showed LV EF 62% with mild LVH, RV EF 53%, subtle if any LGE, and ECV recalculated to 32%.  I do not think that this is suggestive of cardiac amyloidosis.    Patient had another PYP scan done in 10/25, this was also initially read as positive but once again re-interpreted during our imaging conference as negative with uptake in the blood pool.   Abdominal fat pad biopsy in 10/25 was [place] red negative.   Patient returns for followup of ?cardiac amyloidosis today. His  main complaint remains peripheral neuropathy with burning/tingling in his feet and some difficulty with position sense.  Short of breath if he walks fast up stairs, otherwise no exertional dyspnea.  He walks for exercise with his wife.  No chest pain.  No orthopnea/PND. No lightheadedness or palpitations.   ECG (personally reviewed): NSR, nonspecific T wave changes.   Labs (1/25): Pro-BNP 27 Labs (4/25): K 4.6, creatinine 1.22 Labs (6/25): SPEP/UPEP negative Labs (8/25): LDL 64, BNP 49.5, TTR gene testing negative Labs (10/25): K 3.9, creatinine 1.12  PMH: 1. HTN 2. SVT: ?Atrial tachycardia, noted on prior monitors.  3. Rotator cuff tear 4. Biceps tendon rupture 2022 5. Bilateral carpal tunnel syndrome surgery 2012 6. Peripheral neuropathy: Of uncertain etiology.  Workup at [facility], had nerve conduction studies/EMG.  7. 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  8. Calcium  pyrophosphate deposition disease arthritis 9. H/o melanoma 10. CAD: LHC (2022) with 50% mid LCx stenosis.  11. ?Cardiac amyloidosis: TTR gene testing was negative for pathogenic mutations. Myeloma studies negative.  - Echo (5/25): EF 65-70% with borderline LVH, normal RV, PASP 37 mmHg, normal strain pattern.  - PYP scan (7/25): Initially read as grade 3, strongly suggestive of TTR cardiac amyloidosis => however, study re-interpreted to be negative with uptake all in blood pool.  - Cardiac MRI (8/25): LV EF 62%, mild LVH, RV EF 53%, possible LGE in basal septum but very subtle by my interpretation, LGE re-calculated to be around 32%. Not highly suggestive of cardiac amyloidosis.  - PYP scan (10/25): Was initially read as positive => however, study re-interpreted  to be negative with uptake all in blood pool. - Abdominal fat pad biopsy (10/25): Negative for amyloidosis.   FH: No cardiac disease.  Mother and sister with Alzheimer's-type dementia, brother with esophageal cancer.   SH: Retired marine scientist,  married, nonsmoker, rare ETOH.   ROS: All systems reviewed and negative except as per HPI.   Current Outpatient Medications  Medication Sig Dispense Refill   alfuzosin  (UROXATRAL ) 10 MG 24 hr tablet TAKE 1 TABLET BY MOUTH EVERY EVENING 90 tablet 0   ALPRAZolam  (XANAX ) 0.5 MG tablet Take 1 tablet (0.5 mg total) by mouth 2 (two) times daily as needed for anxiety. 180 tablet 0   aspirin  EC 81 MG tablet Take 81 mg by mouth every 3 (three) days. Swallow whole.     celecoxib  (CELEBREX ) 50 MG capsule TAKE 1 CAPSULE BY MOUTH 2 TIMES DAILY. 180 capsule 0   desoximetasone (TOPICORT) 0.25 % cream Apply 1 Application topically every evening.     Multiple Vitamin (MULTIVITAMIN WITH MINERALS) TABS tablet Take 1 tablet by mouth daily.     pantoprazole  (PROTONIX ) 40 MG tablet TAKE 1 TABLET BY MOUTH EVERY DAY 30 MINUTES BEFORE EVENING MEALS 90 tablet 1   rosuvastatin  (CRESTOR ) 10 MG tablet TAKE 1 TABLET BY MOUTH EVERYDAY AT BEDTIME 90 tablet 1   traMADol  (ULTRAM ) 50 MG tablet TAKE 1 TABLET BY MOUTH EVERY 12 HOURS AS NEEDED. 180 tablet 0   Acoramidis , 712MG  Twice Daily, (ATTRUBY ) 356 MG TBPK Take 712 mg by mouth 2 (two) times daily. (Patient not taking: Reported on 09/05/2024) 112 each 11   No current facility-administered medications for this encounter.   BP 130/76   Pulse (!) 103   Ht 5' 9 (1.753 m)   Wt 81.1 kg (178 lb 12.8 oz)   SpO2 95%   BMI 26.40 kg/m  General: NAD Neck: No JVD, no thyromegaly or thyroid  nodule.  Lungs: Clear to auscultation bilaterally with normal respiratory effort. CV: Nondisplaced PMI.  Heart regular S1/S2, no S3/S4, no murmur.  No peripheral edema.  No carotid bruit.  Normal pedal pulses.  Abdomen: Soft, nontender, no hepatosplenomegaly, no distention.  Skin: Intact without lesions or rashes.  Neurologic: Alert and oriented x 3.  Psych: Normal affect. Extremities: No clubbing or cyanosis.  HEENT: Normal.   Assessment/Plan: 1. Workup for TTR cardiac amyloidosis:  Patient has a constellation of symptoms concerning for cardiac amyloidosis; spinal stenosis, bilateral carpal tunnel syndrome, biceps tendon rupture with minimal provocation, peripheral neuropathy of uncertain etiology. However, he also has calcium  pyrophosphate deposition disease arthritis which could explain the carpal tunnel syndrome and tendon rupture. SPEP and UPEP negative.  Echo showed borderline LVH, but I did not find the echo strongly suggestive of cardiac amyloidosis.  However, the PYP scan in 7/25 was initially read as markedly positive, grade 3.  The PYP scan, however, was reviewed by our imaging section and ultimately it was thought to be negative with uptake all in the blood pool.  A subsequent cardiac MRI in 8/25 was also initially read a suggestive of cardiac amyloidosis, but this was re-interpreted and thought unlikely to be cardiac amyloidosis.  Genetic testing was negative for transthyretin gene mutations.  Finally, repeat PYP scan in 10/25 was again initially read as suggestive of cardiac amyloidosis but re-interpreted as negative with all uptake in the blood pool.  Abdominal fat pad biopsy negative.  Patient has NYHA class I-II symptoms, generally limited only by arthritis and balance difficulty from his peripheral neuropathy.  I think  that his symptoms are likely explainable by calcium  pyrophosphate deposition disease + an idiopathic peripheral neuropathy and that he is unlikely to have transthyretin cardiac amyloidosis.  At this point, I do not think that endomyocardial biopsy is worth the risk as my suspicion is low.  - I recommended that he repeat the cardiac MRI at 1 year.    - He wants to go to the [facility] to get an opinion from them as well, I will make a referral.  2  CAD: 50% mid LAD stenosis on cath in 2022.   - Continue ASA 81 - Continue statin.   Followup with me in 6 months.   I spent 32 minutes reviewing records, interviewing/examining patient, and managing  orders.   Ezra Shuck 09/05/2024

## 2024-09-06 ENCOUNTER — Other Ambulatory Visit: Payer: Self-pay | Admitting: Internal Medicine

## 2024-09-06 DIAGNOSIS — I10 Essential (primary) hypertension: Secondary | ICD-10-CM

## 2024-09-07 ENCOUNTER — Other Ambulatory Visit: Payer: Self-pay | Admitting: Internal Medicine

## 2024-09-07 DIAGNOSIS — M479 Spondylosis, unspecified: Secondary | ICD-10-CM

## 2024-09-07 DIAGNOSIS — M150 Primary generalized (osteo)arthritis: Secondary | ICD-10-CM

## 2024-09-07 DIAGNOSIS — M4716 Other spondylosis with myelopathy, lumbar region: Secondary | ICD-10-CM

## 2024-09-07 DIAGNOSIS — M170 Bilateral primary osteoarthritis of knee: Secondary | ICD-10-CM

## 2024-09-07 DIAGNOSIS — M1711 Unilateral primary osteoarthritis, right knee: Secondary | ICD-10-CM

## 2024-09-07 DIAGNOSIS — M1712 Unilateral primary osteoarthritis, left knee: Secondary | ICD-10-CM

## 2024-09-07 DIAGNOSIS — M1189 Other specified crystal arthropathies, multiple sites: Secondary | ICD-10-CM

## 2024-09-07 MED ORDER — CELECOXIB 50 MG PO CAPS: 50.0000 mg | ORAL_CAPSULE | Freq: Two times a day (BID) | ORAL | 0 refills | Status: DC

## 2024-09-14 ENCOUNTER — Encounter: Payer: Self-pay | Admitting: Internal Medicine

## 2024-09-18 ENCOUNTER — Other Ambulatory Visit: Payer: Self-pay | Admitting: Internal Medicine

## 2024-09-18 DIAGNOSIS — I10 Essential (primary) hypertension: Secondary | ICD-10-CM

## 2024-09-18 MED ORDER — INDAPAMIDE 1.25 MG PO TABS: 1.2500 mg | ORAL_TABLET | Freq: Every day | ORAL | 0 refills | Status: AC

## 2024-09-18 MED ORDER — OLMESARTAN MEDOXOMIL 20 MG PO TABS: 20.0000 mg | ORAL_TABLET | Freq: Every day | ORAL | 0 refills | Status: AC

## 2024-09-26 ENCOUNTER — Encounter (HOSPITAL_COMMUNITY): Admitting: Cardiology

## 2024-10-26 ENCOUNTER — Other Ambulatory Visit: Payer: Self-pay | Admitting: Internal Medicine

## 2024-10-26 DIAGNOSIS — N401 Enlarged prostate with lower urinary tract symptoms: Secondary | ICD-10-CM

## 2024-11-02 ENCOUNTER — Other Ambulatory Visit: Payer: Self-pay | Admitting: Internal Medicine

## 2024-11-02 DIAGNOSIS — E785 Hyperlipidemia, unspecified: Secondary | ICD-10-CM

## 2024-11-26 ENCOUNTER — Other Ambulatory Visit: Payer: Self-pay | Admitting: Internal Medicine

## 2024-11-26 DIAGNOSIS — M479 Spondylosis, unspecified: Secondary | ICD-10-CM

## 2024-11-26 DIAGNOSIS — M1712 Unilateral primary osteoarthritis, left knee: Secondary | ICD-10-CM

## 2024-11-26 DIAGNOSIS — M4716 Other spondylosis with myelopathy, lumbar region: Secondary | ICD-10-CM

## 2024-11-26 DIAGNOSIS — M170 Bilateral primary osteoarthritis of knee: Secondary | ICD-10-CM

## 2024-11-26 DIAGNOSIS — M1189 Other specified crystal arthropathies, multiple sites: Secondary | ICD-10-CM

## 2024-11-26 DIAGNOSIS — M1711 Unilateral primary osteoarthritis, right knee: Secondary | ICD-10-CM

## 2024-11-26 DIAGNOSIS — M150 Primary generalized (osteo)arthritis: Secondary | ICD-10-CM
